# Patient Record
Sex: Female | Born: 1935 | Race: White | Hispanic: No | Smoking: Never smoker
Health system: Southern US, Community
[De-identification: ages and names within clinical notes are randomized; demographics above are authoritative.]

## PROBLEM LIST (undated history)

## (undated) DIAGNOSIS — K219 Gastro-esophageal reflux disease without esophagitis: Secondary | ICD-10-CM

## (undated) DIAGNOSIS — M4800 Spinal stenosis, site unspecified: Secondary | ICD-10-CM

## (undated) DIAGNOSIS — I2510 Atherosclerotic heart disease of native coronary artery without angina pectoris: Secondary | ICD-10-CM

## (undated) DIAGNOSIS — R0600 Dyspnea, unspecified: Secondary | ICD-10-CM

## (undated) DIAGNOSIS — Z8679 Personal history of other diseases of the circulatory system: Secondary | ICD-10-CM

## (undated) DIAGNOSIS — I4510 Unspecified right bundle-branch block: Secondary | ICD-10-CM

## (undated) DIAGNOSIS — F320 Major depressive disorder, single episode, mild: Secondary | ICD-10-CM

## (undated) DIAGNOSIS — F32A Depression, unspecified: Secondary | ICD-10-CM

## (undated) DIAGNOSIS — M51369 Other intervertebral disc degeneration, lumbar region without mention of lumbar back pain or lower extremity pain: Secondary | ICD-10-CM

## (undated) DIAGNOSIS — I519 Heart disease, unspecified: Secondary | ICD-10-CM

## (undated) DIAGNOSIS — H269 Unspecified cataract: Secondary | ICD-10-CM

## (undated) DIAGNOSIS — I517 Cardiomegaly: Secondary | ICD-10-CM

## (undated) DIAGNOSIS — D369 Benign neoplasm, unspecified site: Secondary | ICD-10-CM

## (undated) DIAGNOSIS — N189 Chronic kidney disease, unspecified: Secondary | ICD-10-CM

## (undated) DIAGNOSIS — Z972 Presence of dental prosthetic device (complete) (partial): Secondary | ICD-10-CM

## (undated) DIAGNOSIS — E559 Vitamin D deficiency, unspecified: Secondary | ICD-10-CM

## (undated) DIAGNOSIS — E7800 Pure hypercholesterolemia, unspecified: Secondary | ICD-10-CM

## (undated) DIAGNOSIS — M5136 Other intervertebral disc degeneration, lumbar region: Secondary | ICD-10-CM

## (undated) DIAGNOSIS — I10 Essential (primary) hypertension: Secondary | ICD-10-CM

## (undated) DIAGNOSIS — Z8719 Personal history of other diseases of the digestive system: Secondary | ICD-10-CM

## (undated) DIAGNOSIS — F419 Anxiety disorder, unspecified: Secondary | ICD-10-CM

## (undated) DIAGNOSIS — M1990 Unspecified osteoarthritis, unspecified site: Secondary | ICD-10-CM

## (undated) DIAGNOSIS — R7303 Prediabetes: Secondary | ICD-10-CM

## (undated) HISTORY — DX: Essential (primary) hypertension: I10

## (undated) HISTORY — PX: DILATION AND CURETTAGE OF UTERUS: SHX78

## (undated) HISTORY — DX: Vitamin D deficiency, unspecified: E55.9

## (undated) HISTORY — PX: COLONOSCOPY: SHX174

## (undated) HISTORY — DX: Benign neoplasm, unspecified site: D36.9

## (undated) HISTORY — PX: CHOLECYSTECTOMY: SHX55

## (undated) HISTORY — DX: Atherosclerotic heart disease of native coronary artery without angina pectoris: I25.10

## (undated) HISTORY — PX: CRYOTHERAPY: SHX1416

## (undated) HISTORY — DX: Depression, unspecified: F32.A

## (undated) HISTORY — DX: Pure hypercholesterolemia, unspecified: E78.00

## (undated) HISTORY — DX: Major depressive disorder, single episode, mild: F32.0

## (undated) HISTORY — PX: OTHER SURGICAL HISTORY: SHX169

---

## 2005-05-20 ENCOUNTER — Emergency Department (HOSPITAL_COMMUNITY): Admission: EM | Admit: 2005-05-20 | Discharge: 2005-05-20 | Payer: Self-pay | Admitting: Emergency Medicine

## 2007-09-25 ENCOUNTER — Encounter: Admission: RE | Admit: 2007-09-25 | Discharge: 2007-09-25 | Payer: Self-pay | Admitting: Family Medicine

## 2009-08-26 ENCOUNTER — Encounter: Admission: RE | Admit: 2009-08-26 | Discharge: 2009-08-26 | Payer: Self-pay | Admitting: Family Medicine

## 2010-08-12 ENCOUNTER — Encounter: Admission: RE | Admit: 2010-08-12 | Discharge: 2010-08-12 | Payer: Self-pay | Admitting: Orthopedic Surgery

## 2010-08-20 ENCOUNTER — Encounter: Admission: RE | Admit: 2010-08-20 | Discharge: 2010-08-20 | Payer: Self-pay | Admitting: Orthopedic Surgery

## 2011-01-10 ENCOUNTER — Encounter: Payer: Self-pay | Admitting: Family Medicine

## 2013-02-25 ENCOUNTER — Other Ambulatory Visit (HOSPITAL_COMMUNITY): Payer: Self-pay | Admitting: Orthopaedic Surgery

## 2013-03-01 ENCOUNTER — Encounter (HOSPITAL_COMMUNITY): Payer: Self-pay | Admitting: Pharmacy Technician

## 2013-03-06 ENCOUNTER — Ambulatory Visit (HOSPITAL_COMMUNITY)
Admission: RE | Admit: 2013-03-06 | Discharge: 2013-03-06 | Disposition: A | Discharge status: Home / Self Care | Payer: Medicare Other | Source: Ambulatory Visit | Attending: Orthopaedic Surgery | Admitting: Orthopaedic Surgery

## 2013-03-06 ENCOUNTER — Encounter (HOSPITAL_COMMUNITY): Payer: Self-pay

## 2013-03-06 ENCOUNTER — Encounter (HOSPITAL_COMMUNITY)
Admission: RE | Admit: 2013-03-06 | Discharge: 2013-03-06 | Disposition: A | Discharge status: Home / Self Care | Payer: Medicare Other | Source: Ambulatory Visit | Attending: Orthopaedic Surgery | Admitting: Orthopaedic Surgery

## 2013-03-06 DIAGNOSIS — Z01812 Encounter for preprocedural laboratory examination: Secondary | ICD-10-CM | POA: Insufficient documentation

## 2013-03-06 DIAGNOSIS — M169 Osteoarthritis of hip, unspecified: Secondary | ICD-10-CM | POA: Insufficient documentation

## 2013-03-06 DIAGNOSIS — M1610 Unilateral primary osteoarthritis, unspecified hip: Secondary | ICD-10-CM | POA: Insufficient documentation

## 2013-03-06 HISTORY — DX: Essential (primary) hypertension: I10

## 2013-03-06 HISTORY — DX: Unspecified osteoarthritis, unspecified site: M19.90

## 2013-03-06 HISTORY — PX: CATARACT EXTRACTION: SUR2

## 2013-03-06 HISTORY — DX: Gastro-esophageal reflux disease without esophagitis: K21.9

## 2013-03-06 LAB — CBC
HCT: 39.3 % (ref 36.0–46.0)
Hemoglobin: 13.3 g/dL (ref 12.0–15.0)
MCH: 29 pg (ref 26.0–34.0)
MCHC: 33.8 g/dL (ref 30.0–36.0)
MCV: 85.8 fL (ref 78.0–100.0)
Platelets: 429 10*3/uL — ABNORMAL HIGH (ref 150–400)
RBC: 4.58 MIL/uL (ref 3.87–5.11)
RDW: 13 % (ref 11.5–15.5)
WBC: 8.5 10*3/uL (ref 4.0–10.5)

## 2013-03-06 LAB — BASIC METABOLIC PANEL
BUN: 21 mg/dL (ref 6–23)
CO2: 24 mEq/L (ref 19–32)
Calcium: 9.7 mg/dL (ref 8.4–10.5)
Chloride: 96 mEq/L (ref 96–112)
Creatinine, Ser: 0.93 mg/dL (ref 0.50–1.10)
GFR calc Af Amer: 67 mL/min — ABNORMAL LOW (ref >90)
GFR calc non Af Amer: 58 mL/min — ABNORMAL LOW (ref >90)
Glucose, Bld: 93 mg/dL (ref 70–99)
Potassium: 4.8 mEq/L (ref 3.5–5.1)
Sodium: 131 mEq/L — ABNORMAL LOW (ref 135–145)

## 2013-03-06 LAB — URINE MICROSCOPIC-ADD ON

## 2013-03-06 LAB — PROTIME-INR
INR: 0.96 (ref 0.00–1.49)
Prothrombin Time: 12.7 seconds (ref 11.6–15.2)

## 2013-03-06 LAB — URINALYSIS, ROUTINE W REFLEX MICROSCOPIC
Bilirubin Urine: NEGATIVE
Glucose, UA: NEGATIVE mg/dL
Ketones, ur: NEGATIVE mg/dL
Nitrite: NEGATIVE
Protein, ur: NEGATIVE mg/dL
Specific Gravity, Urine: 1.015 (ref 1.005–1.030)
Urobilinogen, UA: 0.2 mg/dL (ref 0.0–1.0)
pH: 5.5 (ref 5.0–8.0)

## 2013-03-06 LAB — ABO/RH: ABO/RH(D): O NEG

## 2013-03-06 LAB — SURGICAL PCR SCREEN
MRSA, PCR: NEGATIVE
Staphylococcus aureus: NEGATIVE

## 2013-03-06 LAB — APTT: aPTT: 34 seconds (ref 24–37)

## 2013-03-06 NOTE — Progress Notes (Signed)
03-06-13 labs viewable in Epic-note urinalysis.

## 2013-03-06 NOTE — Pre-Procedure Instructions (Addendum)
03-06-13 EKG/ CXR done today. 03-06-13 1640- Urinalysis report faxed to Dr. Trevor Mace office. 03-07-13 1030 Voice message left to inform pt. Of date of surgery and time change-now surgery date 03-08-13 -1215 PM, instructed pt. To arrive by 0900 AM to short stay-Nothing to eat or drink after Midnight except take med with sip of water. Instructed pt. To return call to [id number]. WFloy Sabina [id number] Pt. Returned call and confirmed she is aware of date and time change-will arrive by 0900 AM. W. Amaal Dimartino,RN

## 2013-03-06 NOTE — Patient Instructions (Addendum)
20 Deborah Jordan  03/06/2013   Your procedure is scheduled on:  3-28 -2014  Report to [facility] at    1000    AM.  Call this number if you have problems the morning of surgery: [phone]  Or Presurgical Testing [phone](Meggen Spaziani)      Do not eat food:After Midnight.   May have clear liquids:up to 6 Hours before arrival. Nothing after : 0700 AM  Clear liquids include soda, tea, black coffee, apple or grape juice, broth.  Take these medicines the morning of surgery with A SIP OF WATER: Nexium. Do not take Diovan AM of surgery-take all other days.   Do not wear jewelry, make-up or nail polish.  Do not wear lotions, powders, or perfumes. You may wear deodorant.  Do not shave 12 hours prior to first CHG shower(legs and under arms).(face and neck okay.)  Do not bring valuables to the hospital.  Contacts, dentures or bridgework,body piercing,  may not be worn into surgery.  Leave suitcase in the car. After surgery it may be brought to your room.  For patients admitted to the hospital, checkout time is 11:00 AM the day of discharge.   Patients discharged the day of surgery will not be allowed to drive home. Must have responsible person with you x 24 hours once discharged.  Name and phone number of your driver: Meredith Mody [phone] cell/ [phone]  Special Instructions: CHG(Chlorhedine 4%-"Hibiclens","Betasept","Aplicare") Shower Use Special Wash: see special instructions.(avoid face and genitals)   Please read over the following fact sheets that you were given: MRSA Information, Blood Transfusion fact sheet, Incentive Spirometry Instruction.    Failure to follow these instructions may result in Cancellation of your surgery.   Patient signature_______________________________________________________

## 2013-03-07 ENCOUNTER — Other Ambulatory Visit (HOSPITAL_COMMUNITY): Payer: Self-pay | Admitting: Orthopaedic Surgery

## 2013-03-08 ENCOUNTER — Inpatient Hospital Stay (HOSPITAL_COMMUNITY): Payer: Medicare Other | Admitting: Anesthesiology

## 2013-03-08 ENCOUNTER — Inpatient Hospital Stay (HOSPITAL_COMMUNITY): Payer: Medicare Other

## 2013-03-08 ENCOUNTER — Inpatient Hospital Stay (HOSPITAL_COMMUNITY)
Admission: RE | Admit: 2013-03-08 | Discharge: 2013-03-11 | DRG: 470 | Disposition: A | Discharge status: Home Health | Payer: Medicare Other | Source: Ambulatory Visit | Attending: Orthopaedic Surgery | Admitting: Orthopaedic Surgery

## 2013-03-08 ENCOUNTER — Encounter (HOSPITAL_COMMUNITY): Payer: Self-pay | Admitting: *Deleted

## 2013-03-08 ENCOUNTER — Encounter (HOSPITAL_COMMUNITY): Payer: Self-pay | Admitting: Anesthesiology

## 2013-03-08 ENCOUNTER — Encounter (HOSPITAL_COMMUNITY)
Admission: RE | Disposition: A | Discharge status: Home Health | Payer: Self-pay | Source: Ambulatory Visit | Attending: Orthopaedic Surgery

## 2013-03-08 DIAGNOSIS — M169 Osteoarthritis of hip, unspecified: Principal | ICD-10-CM | POA: Diagnosis present

## 2013-03-08 DIAGNOSIS — I10 Essential (primary) hypertension: Secondary | ICD-10-CM

## 2013-03-08 DIAGNOSIS — K219 Gastro-esophageal reflux disease without esophagitis: Secondary | ICD-10-CM | POA: Diagnosis present

## 2013-03-08 DIAGNOSIS — E871 Hypo-osmolality and hyponatremia: Secondary | ICD-10-CM

## 2013-03-08 DIAGNOSIS — J90 Pleural effusion, not elsewhere classified: Secondary | ICD-10-CM

## 2013-03-08 DIAGNOSIS — M1610 Unilateral primary osteoarthritis, unspecified hip: Principal | ICD-10-CM | POA: Diagnosis present

## 2013-03-08 HISTORY — PX: TOTAL HIP ARTHROPLASTY: SHX124

## 2013-03-08 LAB — URINE CULTURE: Colony Count: 50000

## 2013-03-08 LAB — URINALYSIS, ROUTINE W REFLEX MICROSCOPIC
Bilirubin Urine: NEGATIVE
Glucose, UA: NEGATIVE mg/dL
Ketones, ur: NEGATIVE mg/dL
Nitrite: NEGATIVE
Protein, ur: NEGATIVE mg/dL
Specific Gravity, Urine: 1.019 (ref 1.005–1.030)
Urobilinogen, UA: 0.2 mg/dL (ref 0.0–1.0)
pH: 6.5 (ref 5.0–8.0)

## 2013-03-08 LAB — URINE MICROSCOPIC-ADD ON

## 2013-03-08 SURGERY — ARTHROPLASTY, HIP, TOTAL, ANTERIOR APPROACH
Anesthesia: General | Site: Hip | Laterality: Left | Wound class: Clean

## 2013-03-08 MED ORDER — FENTANYL CITRATE 0.05 MG/ML IJ SOLN
INTRAMUSCULAR | Status: DC | PRN
  Administered 2013-03-08 (×2): 50 ug via INTRAVENOUS
  Administered 2013-03-08: 100 ug via INTRAVENOUS
  Administered 2013-03-08: 50 ug via INTRAVENOUS

## 2013-03-08 MED ORDER — OXYCODONE HCL 5 MG/5ML PO SOLN: 5.0000 mg | Freq: Once | ORAL | Status: DC | PRN

## 2013-03-08 MED ORDER — METHOCARBAMOL 500 MG PO TABS
500.0000 mg | ORAL_TABLET | Freq: Four times a day (QID) | ORAL | Status: DC | PRN
  Administered 2013-03-09 – 2013-03-10 (×2): 500 mg via ORAL
  Filled 2013-03-08 (×2): qty 1

## 2013-03-08 MED ORDER — OXYCODONE HCL 5 MG PO TABS
5.0000 mg | ORAL_TABLET | ORAL | Status: DC | PRN
  Administered 2013-03-09: 5 mg via ORAL
  Administered 2013-03-09: 10 mg via ORAL
  Administered 2013-03-09: 5 mg via ORAL
  Administered 2013-03-10: 10 mg via ORAL
  Administered 2013-03-10: 5 mg via ORAL
  Administered 2013-03-10 – 2013-03-11 (×2): 10 mg via ORAL
  Filled 2013-03-08: qty 1
  Filled 2013-03-08 (×3): qty 2
  Filled 2013-03-08: qty 1
  Filled 2013-03-08: qty 2
  Filled 2013-03-08: qty 1
  Filled 2013-03-08: qty 2

## 2013-03-08 MED ORDER — ROCURONIUM BROMIDE 100 MG/10ML IV SOLN
INTRAVENOUS | Status: DC | PRN
  Administered 2013-03-08: 30 mg via INTRAVENOUS

## 2013-03-08 MED ORDER — METOCLOPRAMIDE HCL 10 MG PO TABS: 5.0000 mg | ORAL_TABLET | Freq: Three times a day (TID) | ORAL | Status: DC | PRN

## 2013-03-08 MED ORDER — LACTATED RINGERS IV SOLN
INTRAVENOUS | Status: DC | PRN
  Administered 2013-03-08 (×2): via INTRAVENOUS

## 2013-03-08 MED ORDER — ASPIRIN EC 325 MG PO TBEC
325.0000 mg | DELAYED_RELEASE_TABLET | Freq: Two times a day (BID) | ORAL | Status: DC
  Administered 2013-03-08 – 2013-03-11 (×5): 325 mg via ORAL
  Filled 2013-03-08 (×9): qty 1

## 2013-03-08 MED ORDER — PANTOPRAZOLE SODIUM 40 MG PO TBEC
80.0000 mg | DELAYED_RELEASE_TABLET | Freq: Every day | ORAL | Status: DC
  Administered 2013-03-09 – 2013-03-11 (×3): 80 mg via ORAL
  Filled 2013-03-08 (×3): qty 2

## 2013-03-08 MED ORDER — IRBESARTAN 300 MG PO TABS
300.0000 mg | ORAL_TABLET | Freq: Every day | ORAL | Status: DC
  Administered 2013-03-08 – 2013-03-11 (×3): 300 mg via ORAL
  Filled 2013-03-08 (×4): qty 1

## 2013-03-08 MED ORDER — HYDROMORPHONE HCL PF 1 MG/ML IJ SOLN
0.5000 mg | INTRAMUSCULAR | Status: DC | PRN
  Administered 2013-03-08 – 2013-03-09 (×4): 0.5 mg via INTRAVENOUS
  Filled 2013-03-08 (×4): qty 1

## 2013-03-08 MED ORDER — SODIUM CHLORIDE 0.9 % IV SOLN
INTRAVENOUS | Status: DC | PRN
  Administered 2013-03-08: 1000 mL via INTRAMUSCULAR

## 2013-03-08 MED ORDER — MENTHOL 3 MG MT LOZG
1.0000 | LOZENGE | OROMUCOSAL | Status: DC | PRN
  Filled 2013-03-08: qty 9

## 2013-03-08 MED ORDER — METOCLOPRAMIDE HCL 5 MG/ML IJ SOLN
10.0000 mg | Freq: Once | INTRAMUSCULAR | Status: AC | PRN
  Administered 2013-03-08: 10 mg via INTRAVENOUS

## 2013-03-08 MED ORDER — METHOCARBAMOL 100 MG/ML IJ SOLN: 500.0000 mg | Freq: Four times a day (QID) | INTRAVENOUS | Status: DC | PRN

## 2013-03-08 MED ORDER — ZOLPIDEM TARTRATE 5 MG PO TABS: 5.0000 mg | ORAL_TABLET | Freq: Every evening | ORAL | Status: DC | PRN

## 2013-03-08 MED ORDER — ACETAMINOPHEN 325 MG PO TABS
650.0000 mg | ORAL_TABLET | Freq: Four times a day (QID) | ORAL | Status: DC | PRN
  Administered 2013-03-10 (×2): 650 mg via ORAL
  Filled 2013-03-08 (×2): qty 2

## 2013-03-08 MED ORDER — METOCLOPRAMIDE HCL 5 MG/ML IJ SOLN
5.0000 mg | Freq: Three times a day (TID) | INTRAMUSCULAR | Status: DC | PRN
  Administered 2013-03-09: 5 mg via INTRAVENOUS
  Administered 2013-03-10: 10 mg via INTRAVENOUS
  Filled 2013-03-08 (×2): qty 2

## 2013-03-08 MED ORDER — LABETALOL HCL 5 MG/ML IV SOLN
INTRAVENOUS | Status: DC | PRN
  Administered 2013-03-08 (×2): 5 mg via INTRAVENOUS

## 2013-03-08 MED ORDER — FERROUS SULFATE 325 (65 FE) MG PO TABS
325.0000 mg | ORAL_TABLET | Freq: Three times a day (TID) | ORAL | Status: DC
  Administered 2013-03-08 – 2013-03-11 (×6): 325 mg via ORAL
  Filled 2013-03-08 (×12): qty 1

## 2013-03-08 MED ORDER — ALUM & MAG HYDROXIDE-SIMETH 200-200-20 MG/5ML PO SUSP
30.0000 mL | ORAL | Status: DC | PRN
  Administered 2013-03-10: 30 mL via ORAL
  Filled 2013-03-08: qty 30

## 2013-03-08 MED ORDER — HYDROMORPHONE HCL PF 1 MG/ML IJ SOLN
INTRAMUSCULAR | Status: DC | PRN
  Administered 2013-03-08 (×2): 0.5 mg via INTRAVENOUS

## 2013-03-08 MED ORDER — 0.9 % SODIUM CHLORIDE (POUR BTL) OPTIME
TOPICAL | Status: DC | PRN
  Administered 2013-03-08: 1000 mL

## 2013-03-08 MED ORDER — LACTATED RINGERS IV SOLN
INTRAVENOUS | Status: DC
  Administered 2013-03-08: 20:00:00 via INTRAVENOUS

## 2013-03-08 MED ORDER — OXYCODONE HCL ER 10 MG PO T12A
10.0000 mg | EXTENDED_RELEASE_TABLET | Freq: Two times a day (BID) | ORAL | Status: DC
Start: 2013-03-08 — End: 2013-03-11
  Administered 2013-03-08 – 2013-03-11 (×5): 10 mg via ORAL
  Filled 2013-03-08 (×5): qty 1

## 2013-03-08 MED ORDER — LIDOCAINE HCL (CARDIAC) 20 MG/ML IV SOLN
INTRAVENOUS | Status: DC | PRN
  Administered 2013-03-08: 50 mg via INTRAVENOUS

## 2013-03-08 MED ORDER — ACETAMINOPHEN 10 MG/ML IV SOLN
INTRAVENOUS | Status: DC | PRN
  Administered 2013-03-08: 1000 mg via INTRAVENOUS

## 2013-03-08 MED ORDER — CEFAZOLIN SODIUM-DEXTROSE 2-3 GM-% IV SOLR
2.0000 g | INTRAVENOUS | Status: AC
  Administered 2013-03-08: 2 g via INTRAVENOUS

## 2013-03-08 MED ORDER — DIPHENHYDRAMINE HCL 12.5 MG/5ML PO ELIX: 12.5000 mg | ORAL_SOLUTION | ORAL | Status: DC | PRN

## 2013-03-08 MED ORDER — ONDANSETRON HCL 4 MG/2ML IJ SOLN
INTRAMUSCULAR | Status: DC | PRN
  Administered 2013-03-08: 4 mg via INTRAVENOUS

## 2013-03-08 MED ORDER — STERILE WATER FOR IRRIGATION IR SOLN
Status: DC | PRN
  Administered 2013-03-08: 3000 mL

## 2013-03-08 MED ORDER — ACETAMINOPHEN 650 MG RE SUPP: 650.0000 mg | Freq: Four times a day (QID) | RECTAL | Status: DC | PRN

## 2013-03-08 MED ORDER — ONDANSETRON HCL 4 MG/2ML IJ SOLN
4.0000 mg | Freq: Four times a day (QID) | INTRAMUSCULAR | Status: DC | PRN
  Administered 2013-03-08 – 2013-03-09 (×2): 4 mg via INTRAVENOUS
  Filled 2013-03-08 (×2): qty 2

## 2013-03-08 MED ORDER — CEFAZOLIN SODIUM 1-5 GM-% IV SOLN
1.0000 g | Freq: Four times a day (QID) | INTRAVENOUS | Status: AC
Start: 2013-03-08 — End: 2013-03-09
  Administered 2013-03-08 – 2013-03-09 (×2): 1 g via INTRAVENOUS
  Filled 2013-03-08 (×2): qty 50

## 2013-03-08 MED ORDER — DOCUSATE SODIUM 100 MG PO CAPS
100.0000 mg | ORAL_CAPSULE | Freq: Two times a day (BID) | ORAL | Status: DC
  Administered 2013-03-09 – 2013-03-11 (×5): 100 mg via ORAL
  Filled 2013-03-08 (×7): qty 1

## 2013-03-08 MED ORDER — ONDANSETRON HCL 4 MG PO TABS
4.0000 mg | ORAL_TABLET | Freq: Four times a day (QID) | ORAL | Status: DC | PRN
  Administered 2013-03-10: 4 mg via ORAL
  Filled 2013-03-08: qty 1

## 2013-03-08 MED ORDER — PROPOFOL 10 MG/ML IV BOLUS
INTRAVENOUS | Status: DC | PRN
  Administered 2013-03-08: 150 mg via INTRAVENOUS

## 2013-03-08 MED ORDER — SODIUM CHLORIDE 0.9 % IV SOLN
INTRAVENOUS | Status: DC
  Administered 2013-03-08: 1000 mL via INTRAVENOUS
  Administered 2013-03-09: 11:00:00 via INTRAVENOUS

## 2013-03-08 MED ORDER — HYDROMORPHONE HCL PF 1 MG/ML IJ SOLN
0.2500 mg | INTRAMUSCULAR | Status: DC | PRN
  Administered 2013-03-08: 0.5 mg via INTRAVENOUS

## 2013-03-08 MED ORDER — PHENOL 1.4 % MT LIQD: 1.0000 | OROMUCOSAL | Status: DC | PRN

## 2013-03-08 MED ORDER — EPHEDRINE SULFATE 50 MG/ML IJ SOLN
INTRAMUSCULAR | Status: DC | PRN
  Administered 2013-03-08: 5 mg via INTRAVENOUS

## 2013-03-08 MED ORDER — OXYCODONE HCL 5 MG PO TABS: 5.0000 mg | ORAL_TABLET | Freq: Once | ORAL | Status: DC | PRN

## 2013-03-08 SURGICAL SUPPLY — 42 items
ADH SKN CLS APL DERMABOND .7 (GAUZE/BANDAGES/DRESSINGS) ×1
BAG SPEC THK2 15X12 ZIP CLS (MISCELLANEOUS) ×2
BAG ZIPLOCK 12X15 (MISCELLANEOUS) ×4 IMPLANT
BLADE SAW SGTL 18X1.27X75 (BLADE) ×2 IMPLANT
CELLS DAT CNTRL 66122 CELL SVR (MISCELLANEOUS) ×1 IMPLANT
CLOTH BEACON ORANGE TIMEOUT ST (SAFETY) ×2 IMPLANT
DERMABOND ADVANCED (GAUZE/BANDAGES/DRESSINGS) ×1
DERMABOND ADVANCED .7 DNX12 (GAUZE/BANDAGES/DRESSINGS) ×1 IMPLANT
DRAPE C-ARM 42X72 X-RAY (DRAPES) ×2 IMPLANT
DRAPE STERI IOBAN 125X83 (DRAPES) ×2 IMPLANT
DRAPE U-SHAPE 47X51 STRL (DRAPES) ×6 IMPLANT
DRSG AQUACEL AG ADV 3.5X10 (GAUZE/BANDAGES/DRESSINGS) ×2 IMPLANT
DURAPREP 26ML APPLICATOR (WOUND CARE) ×2 IMPLANT
ELECT BLADE TIP CTD 4 INCH (ELECTRODE) ×2 IMPLANT
ELECT REM PT RETURN 9FT ADLT (ELECTROSURGICAL) ×2
ELECTRODE REM PT RTRN 9FT ADLT (ELECTROSURGICAL) ×1 IMPLANT
FACESHIELD LNG OPTICON STERILE (SAFETY) ×8 IMPLANT
GLOVE BIO SURGEON STRL SZ7 (GLOVE) ×2 IMPLANT
GLOVE BIO SURGEON STRL SZ7.5 (GLOVE) ×2 IMPLANT
GLOVE BIOGEL PI IND STRL 7.5 (GLOVE) IMPLANT
GLOVE BIOGEL PI IND STRL 8 (GLOVE) ×1 IMPLANT
GLOVE BIOGEL PI INDICATOR 7.5 (GLOVE)
GLOVE BIOGEL PI INDICATOR 8 (GLOVE) ×1
GLOVE ECLIPSE 7.0 STRL STRAW (GLOVE) ×2 IMPLANT
GOWN STRL REIN XL XLG (GOWN DISPOSABLE) ×4 IMPLANT
HANDPIECE INTERPULSE COAX TIP (DISPOSABLE) ×1
KIT BASIN OR (CUSTOM PROCEDURE TRAY) ×2 IMPLANT
PACK TOTAL JOINT (CUSTOM PROCEDURE TRAY) ×2 IMPLANT
PADDING CAST COTTON 6X4 STRL (CAST SUPPLIES) ×2 IMPLANT
RTRCTR WOUND ALEXIS 18CM MED (MISCELLANEOUS) ×2
SET HNDPC FAN SPRY TIP SCT (DISPOSABLE) ×1 IMPLANT
SUT ETHIBOND NAB CT1 #1 30IN (SUTURE) ×6 IMPLANT
SUT MNCRL AB 4-0 PS2 18 (SUTURE) ×2 IMPLANT
SUT VIC AB 0 CT1 27 (SUTURE) ×4
SUT VIC AB 0 CT1 27XBRD ANTBC (SUTURE) ×2 IMPLANT
SUT VIC AB 1 CT1 36 (SUTURE) ×4 IMPLANT
SUT VIC AB 2-0 CT1 27 (SUTURE) ×4
SUT VIC AB 2-0 CT1 TAPERPNT 27 (SUTURE) ×2 IMPLANT
SUT VLOC 180 0 24IN GS25 (SUTURE) ×2 IMPLANT
TOWEL OR 17X26 10 PK STRL BLUE (TOWEL DISPOSABLE) ×4 IMPLANT
TOWEL OR NON WOVEN STRL DISP B (DISPOSABLE) ×2 IMPLANT
TRAY FOLEY CATH 14FRSI W/METER (CATHETERS) ×2 IMPLANT

## 2013-03-08 NOTE — Transfer of Care (Signed)
Immediate Anesthesia Transfer of Care Note  Patient: Deborah Jordan  Procedure(s) Performed: Procedure(s) (LRB): LEFT TOTAL HIP ARTHROPLASTY ANTERIOR APPROACH (Left)  Patient Location: PACU  Anesthesia Type: General  Level of Consciousness: sedated, patient cooperative and responds to stimulaton  Airway & Oxygen Therapy: Patient Spontanous Breathing and Patient connected to face mask oxgen  Post-op Assessment: Report given to PACU RN and Post -op Vital signs reviewed and stable  Post vital signs: Reviewed and stable  Complications: No apparent anesthesia complications

## 2013-03-08 NOTE — Brief Op Note (Signed)
03/08/2013  1:32 PM  PATIENT:  [name]  79 y.o. female  PRE-OPERATIVE DIAGNOSIS:  Left hip severe osteoarthritis  POST-OPERATIVE DIAGNOSIS:  Left hip severe osteoarthritis  PROCEDURE:  Procedure(s): LEFT TOTAL HIP ARTHROPLASTY ANTERIOR APPROACH (Left)  SURGEON:  Surgeon(s) and Role:    * Mcarthur Rossetti, MD - Primary  PHYSICIAN ASSISTANT: Benita Stabile, PA-C  ANESTHESIA:   general  EBL:  Total I/O In: 1000 [I.V.:1000] Out: 300 [Blood:300]  BLOOD ADMINISTERED:none  DRAINS: none   LOCAL MEDICATIONS USED:  NONE  SPECIMEN:  No Specimen  DISPOSITION OF SPECIMEN:  N/A  COUNTS:  YES  TOURNIQUET:  * No tourniquets in log *  DICTATION: .Other Dictation: Dictation Number [id number]  PLAN OF CARE: Admit to inpatient   PATIENT DISPOSITION:  PACU - hemodynamically stable.   Delay start of Pharmacological VTE agent (>24hrs) due to surgical blood loss or risk of bleeding: no

## 2013-03-08 NOTE — Anesthesia Preprocedure Evaluation (Signed)
Anesthesia Evaluation  Patient identified by MRN, date of birth, ID band Patient awake    Reviewed: Allergy & Precautions, H&P , NPO status , Patient's Chart, lab work & pertinent test results, reviewed documented beta blocker date and time   Airway Mallampati: II TM Distance: >3 FB Neck ROM: full    Dental   Pulmonary neg pulmonary ROS,  breath sounds clear to auscultation        Cardiovascular hypertension, On Medications Rhythm:regular     Neuro/Psych negative neurological ROS  negative psych ROS   GI/Hepatic Neg liver ROS, GERD-  Medicated and Controlled,  Endo/Other  negative endocrine ROS  Renal/GU negative Renal ROS  negative genitourinary   Musculoskeletal   Abdominal   Peds  Hematology negative hematology ROS (+)   Anesthesia Other Findings See surgeon's H&P   Reproductive/Obstetrics negative OB ROS                           Anesthesia Physical Anesthesia Plan  ASA: II  Anesthesia Plan: General   Post-op Pain Management:    Induction: Intravenous  Airway Management Planned: Oral ETT  Additional Equipment:   Intra-op Plan:   Post-operative Plan: Extubation in OR  Informed Consent: I have reviewed the patients History and Physical, chart, labs and discussed the procedure including the risks, benefits and alternatives for the proposed anesthesia with the patient or authorized representative who has indicated his/her understanding and acceptance.   Dental Advisory Given  Plan Discussed with: CRNA and Surgeon  Anesthesia Plan Comments:         Anesthesia Quick Evaluation

## 2013-03-08 NOTE — H&P (Signed)
TOTAL HIP ADMISSION H&P  Patient is admitted for left total hip arthroplasty.  Subjective:  Chief Complaint: left hip pain  HPI: Deborah Jordan, 79 y.o. female, has a history of pain and functional disability in the left hip(s) due to arthritis and patient has failed non-surgical conservative treatments for greater than 12 weeks to include NSAID's and/or analgesics, use of assistive devices, weight reduction as appropriate and activity modification.  Onset of symptoms was gradual starting 1 years ago with rapidlly worsening course since that time.The patient noted no past surgery on the left hip(s).  Patient currently rates pain in the left hip at 10 out of 10 with activity. Patient has night pain, worsening of pain with activity and weight bearing, trendelenberg gait, pain that interfers with activities of daily living, pain with passive range of motion and crepitus. Patient has evidence of subchondral cysts, subchondral sclerosis, periarticular osteophytes and joint space narrowing by imaging studies. This condition presents safety issues increasing the risk of falls.  There is no current active infection.  Patient Active Problem List   Diagnosis Date Noted  . Degenerative arthritis of hip 03/08/2013   Past Medical History  Diagnosis Date  . Hypertension   . GERD (gastroesophageal reflux disease)     controls with Nexium  . Arthritis     osteoarthritis. spinal stenosis. Scoliosis of spine-degenerative spine.    Past Surgical History  Procedure Laterality Date  . Cholecystectomy    . Cataract extraction  03-06-13    left eye  . Dilation and curettage of uterus      Prescriptions prior to admission  Medication Sig Dispense Refill  . esomeprazole (NEXIUM) 40 MG capsule Take 40 mg by mouth as needed (indigestion).      . naproxen sodium (ANAPROX) 220 MG tablet Take 220 mg by mouth 2 (two) times daily with a meal.      . valsartan (DIOVAN) 320 MG tablet Take 320 mg by mouth daily before  breakfast.       No Known Allergies  History  Substance Use Topics  . Smoking status: Not on file  . Smokeless tobacco: Not on file  . Alcohol Use: No    History reviewed. No pertinent family history.   Review of Systems  Musculoskeletal: Positive for joint pain.  All other systems reviewed and are negative.    Objective:  Physical Exam  Constitutional: She is oriented to person, place, and time. She appears well-developed and well-nourished.  HENT:  Head: Normocephalic and atraumatic.  Eyes: EOM are normal. Pupils are equal, round, and reactive to light.  Neck: Normal range of motion. Neck supple.  Cardiovascular: Normal rate and regular rhythm.   Respiratory: Effort normal and breath sounds normal.  GI: Soft. Bowel sounds are normal.  Musculoskeletal:       Left hip: She exhibits decreased range of motion, decreased strength, bony tenderness and crepitus.  Neurological: She is alert and oriented to person, place, and time.  Skin: Skin is warm and dry.  Psychiatric: She has a normal mood and affect.    Vital signs in last 24 hours: Temp:  [98.5 F (36.9 C)] 98.5 F (36.9 C) (03/21 0909) Pulse Rate:  [71] 71 (03/21 0909) Resp:  [18] 18 (03/21 0909) BP: (160)/(66) 160/66 mmHg (03/21 0909) SpO2:  [100 %] 100 % (03/21 0909)  Labs:   There is no weight on file to calculate BMI.   Imaging Review Plain radiographs demonstrate severe degenerative joint disease of the left  hip(s). The bone quality appears to be good for age and reported activity level.  Assessment/Plan:  End stage arthritis, left hip(s)  The patient history, physical examination, clinical judgement of the provider and imaging studies are consistent with end stage degenerative joint disease of the left hip(s) and total hip arthroplasty is deemed medically necessary. The treatment options including medical management, injection therapy, arthroscopy and arthroplasty were discussed at length. The risks  and benefits of total hip arthroplasty were presented and reviewed. The risks due to aseptic loosening, infection, stiffness, dislocation/subluxation,  thromboembolic complications and other imponderables were discussed.  The patient acknowledged the explanation, agreed to proceed with the plan and consent was signed. Patient is being admitted for inpatient treatment for surgery, pain control, PT, OT, prophylactic antibiotics, VTE prophylaxis, progressive ambulation and ADL's and discharge planning.The patient is planning to be discharged home with home health services

## 2013-03-08 NOTE — Anesthesia Postprocedure Evaluation (Signed)
Anesthesia Post Note  Patient: Deborah Jordan  Procedure(s) Performed: Procedure(s) (LRB): LEFT TOTAL HIP ARTHROPLASTY ANTERIOR APPROACH (Left)  Anesthesia type: general  Patient location: PACU  Post pain: Pain level controlled  Post assessment: Patient's Cardiovascular Status Stable  Last Vitals:  Filed Vitals:   03/08/13 1500  BP:   Pulse:   Temp: 36.4 C  Resp:     Post vital signs: Reviewed and stable  Level of consciousness: sedated  Complications: No apparent anesthesia complications

## 2013-03-09 ENCOUNTER — Encounter (HOSPITAL_COMMUNITY): Payer: Self-pay

## 2013-03-09 LAB — BASIC METABOLIC PANEL
BUN: 19 mg/dL (ref 6–23)
CO2: 27 mEq/L (ref 19–32)
Calcium: 8.8 mg/dL (ref 8.4–10.5)
Chloride: 96 mEq/L (ref 96–112)
Creatinine, Ser: 0.82 mg/dL (ref 0.50–1.10)
GFR calc Af Amer: 78 mL/min — ABNORMAL LOW (ref >90)
GFR calc non Af Amer: 67 mL/min — ABNORMAL LOW (ref >90)
Glucose, Bld: 123 mg/dL — ABNORMAL HIGH (ref 70–99)
Potassium: 4.1 mEq/L (ref 3.5–5.1)
Sodium: 132 mEq/L — ABNORMAL LOW (ref 135–145)

## 2013-03-09 LAB — CBC
HCT: 31.2 % — ABNORMAL LOW (ref 36.0–46.0)
Hemoglobin: 10.5 g/dL — ABNORMAL LOW (ref 12.0–15.0)
MCH: 29 pg (ref 26.0–34.0)
MCHC: 33.7 g/dL (ref 30.0–36.0)
MCV: 86.2 fL (ref 78.0–100.0)
Platelets: 340 10*3/uL (ref 150–400)
RBC: 3.62 MIL/uL — ABNORMAL LOW (ref 3.87–5.11)
RDW: 13.2 % (ref 11.5–15.5)
WBC: 14.6 10*3/uL — ABNORMAL HIGH (ref 4.0–10.5)

## 2013-03-09 MED ORDER — ASPIRIN 325 MG PO TBEC: 325.0000 mg | DELAYED_RELEASE_TABLET | Freq: Two times a day (BID) | ORAL | Status: DC

## 2013-03-09 MED ORDER — OXYCODONE HCL 5 MG PO TABS: 5.0000 mg | ORAL_TABLET | ORAL | Status: DC | PRN

## 2013-03-09 MED ORDER — FERROUS SULFATE 325 (65 FE) MG PO TABS: 325.0000 mg | ORAL_TABLET | Freq: Three times a day (TID) | ORAL | Status: DC

## 2013-03-09 MED ORDER — METHOCARBAMOL 500 MG PO TABS: 500.0000 mg | ORAL_TABLET | Freq: Three times a day (TID) | ORAL | Status: DC

## 2013-03-09 NOTE — Evaluation (Signed)
Occupational Therapy Evaluation Patient Details Name: Deborah Jordan MRN: [id number] DOB: [birth year] Today's Date: 03/09/2013 Time: [id number]    OT Assessment / Plan / Recommendation Clinical Impression  Pt presents to OT s/p anterior THA. Pt will benefit from skilled OT to increase I with ADL actiivty and return to PLOF    OT Assessment  Patient needs continued OT Services    Follow Up Recommendations  No OT follow up             Frequency  Min 2X/week    Precautions / Restrictions Precautions Precautions: None Restrictions Weight Bearing Restrictions: No LLE Weight Bearing: Weight bearing as tolerated Other Position/Activity Restrictions: WBAT       ADL  Upper Body Dressing: Performed;Supervision/safety Where Assessed - Upper Body Dressing: Unsupported standing Lower Body Dressing: Simulated;Moderate assistance Where Assessed - Lower Body Dressing: Supported sit to stand Toilet Transfer: Pharmacologist Method: Sit to stand;Other (comment) (bed to chair) Toileting - Clothing Manipulation and Hygiene: Performed;Minimal assistance Where Assessed - Toileting Clothing Manipulation and Hygiene: Standing    OT Diagnosis: Generalized weakness  OT Problem List: Decreased strength OT Treatment Interventions: Self-care/ADL training;Patient/family education;DME and/or AE instruction   OT Goals Acute Rehab OT Goals OT Goal Formulation: With patient Time For Goal Achievement: 03/23/13 ADL Goals Pt Will Perform Lower Body Dressing: with supervision;Sit to stand from chair ADL Goal: Lower Body Dressing - Progress: Goal set today Pt Will Transfer to Toilet: with modified independence;Comfort height toilet ADL Goal: Toilet Transfer - Progress: Goal set today Pt Will Perform Tub/Shower Transfer: Tub transfer;with supervision ADL Goal: Tub/Shower Transfer - Progress: Goal set today  Visit Information  Last OT Received On: 03/09/13 Assistance Needed:  +1    Subjective Data  Subjective: i am just dizzy and sick feeling   Prior Functioning     Home Living Lives With: Family Available Help at Discharge: Family Type of Home: House Home Access: Stairs to enter [employer] of Steps: 2 front and 4 back Entrance Stairs-Rails: None Home Layout: One level Bathroom Shower/Tub: Product/process development scientist: Standard Home Adaptive Equipment: Straight cane Prior Function Level of Independence: Independent Able to Take Stairs?: Yes Driving: Yes Communication Communication: No difficulties         Vision/Perception Vision - History Patient Visual Report: No change from baseline   Cognition  Cognition Overall Cognitive Status: Appears within functional limits for tasks assessed/performed Arousal/Alertness: Awake/alert Orientation Level: Appears intact for tasks assessed Behavior During Session: [facility] for tasks performed    Extremity/Trunk Assessment Right Upper Extremity Assessment RUE ROM/Strength/Tone: Within functional levels Left Upper Extremity Assessment LUE ROM/Strength/Tone: Within functional levels Right Lower Extremity Assessment RLE ROM/Strength/Tone: WFL for tasks assessed RLE Sensation: WFL - Light Touch Left Lower Extremity Assessment LLE ROM/Strength/Tone: Deficits LLE ROM/Strength/Tone Deficits: pt able to assist somewhat with hip add to get LE to EOB, however still requires some assist due to pain.  LLE Sensation: WFL - Light Touch Trunk Assessment Trunk Assessment: Kyphotic     Mobility Bed Mobility Bed Mobility: Supine to Sit Supine to Sit: 4: Min assist Transfers Transfers: Sit to Stand;Stand to Sit Sit to Stand: 4: Min guard;From bed;With upper extremity assist Stand to Sit: To chair/3-in-1;4: Min assist;With upper extremity assist;With armrests Details for Transfer Assistance: verbal cues for hand placement           End of Session OT - End of Session Activity  Tolerance: Patient tolerated treatment well Patient left: in chair;with call bell/phone within  reach;with family/visitor present Nurse Communication: Mobility status  GO     Betsy Pries 03/09/2013, 10:22 AM

## 2013-03-09 NOTE — Evaluation (Signed)
Physical Therapy Evaluation Patient Details Name: Deborah Jordan MRN: [id number] DOB: 03-09-1933 Today's Date: 03/09/2013 Time: [id number] PT Time Calculation (min): 24 min  PT Assessment / Plan / Recommendation Clinical Impression  Pt presents s/p L THA (direct ant) POD 1 with decreased strength, ROM and mobility.  Tolerated OOB to chair, however deferred ambulation due to increased nausea.  RN aware and to give nausea meds.  Pt will benefit from skilled PT in acute venue to addres deficits.  PT recommends HHPT for follow up at D/C to maximize pts safety and function.     PT Assessment  Patient needs continued PT services    Follow Up Recommendations  Home health PT    Does the patient have the potential to tolerate intense rehabilitation      Barriers to Discharge None      Equipment Recommendations  Rolling walker with 5" wheels    Recommendations for Other Services     Frequency 7X/week    Precautions / Restrictions Precautions Precautions: None Restrictions Weight Bearing Restrictions: No LLE Weight Bearing: Weight bearing as tolerated Other Position/Activity Restrictions: WBAT   Pertinent Vitals/Pain No pain, just increased nausea.       Mobility  Bed Mobility Bed Mobility: Supine to Sit Supine to Sit: 4: Min assist;HOB elevated Details for Bed Mobility Assistance: Assist for LLE out of bed with min cues for hand placement to self assist trunk to EOB.  Transfers Transfers: Sit to Stand;Stand to Sit;Stand Pivot Transfers Sit to Stand: 4: Min assist;From elevated surface;With upper extremity assist;From bed Stand to Sit: 4: Min assist;With upper extremity assist;With armrests;To chair/3-in-1 Stand Pivot Transfers: 4: Min assist Details for Transfer Assistance: Performed x 2 due to rest break from nausea.  Assist to steady throughout with cues for hand placement and LE management.  Pt too nauseated to attempt amb but able to get to chair from bed with min assist and  cues for sequencing/technique with RW.  Ambulation/Gait Ambulation/Gait Assistance: Not tested (comment) Assistive device: Rolling walker Gait Pattern: Step-to pattern;Decreased stride length;Trunk flexed;Antalgic Gait velocity: decreased    Exercises     PT Diagnosis: Difficulty walking;Generalized weakness;Acute pain  PT Problem List: Decreased strength;Decreased range of motion;Decreased activity tolerance;Decreased balance;Decreased mobility;Decreased coordination;Decreased knowledge of use of DME;Decreased safety awareness;Pain PT Treatment Interventions: DME instruction;Gait training;Stair training;Functional mobility training;Therapeutic activities;Therapeutic exercise;Balance training;Patient/family education   PT Goals Acute Rehab PT Goals PT Goal Formulation: With patient Time For Goal Achievement: 03/13/13 Potential to Achieve Goals: Good Pt will go Supine/Side to Sit: with supervision PT Goal: Supine/Side to Sit - Progress: Goal set today Pt will go Sit to Supine/Side: with supervision PT Goal: Sit to Supine/Side - Progress: Goal set today Pt will go Sit to Stand: with supervision PT Goal: Sit to Stand - Progress: Goal set today Pt will go Stand to Sit: with supervision PT Goal: Stand to Sit - Progress: Goal set today Pt will Ambulate: 51 - 150 feet;with supervision;with least restrictive assistive device PT Goal: Ambulate - Progress: Goal set today Pt will Go Up / Down Stairs: 1-2 stairs;with min assist;with least restrictive assistive device PT Goal: Up/Down Stairs - Progress: Goal set today  Visit Information  Last PT Received On: 03/09/13 Assistance Needed: +1    Subjective Data  Subjective: I'm just so nauseous Patient Stated Goal: to return home.    Prior Functioning  Home Living Lives With: Family Available Help at Discharge: Family Type of Home: House Home Access: Stairs to enter [employer]  of Steps: 2 front and 4 back Entrance  Stairs-Rails: None Home Layout: One level Bathroom Shower/Tub: Tub/shower unit;Curtain Biochemist, clinical: Standard Home Adaptive Equipment: Straight cane Prior Function Level of Independence: Independent Able to Take Stairs?: Yes Driving: Yes Communication Communication: No difficulties    Cognition  Cognition Overall Cognitive Status: Appears within functional limits for tasks assessed/performed Arousal/Alertness: Awake/alert Orientation Level: Appears intact for tasks assessed Behavior During Session: [facility] for tasks performed    Extremity/Trunk Assessment Right Upper Extremity Assessment RUE ROM/Strength/Tone: Within functional levels Left Upper Extremity Assessment LUE ROM/Strength/Tone: Within functional levels Right Lower Extremity Assessment RLE ROM/Strength/Tone: WFL for tasks assessed RLE Sensation: WFL - Light Touch Left Lower Extremity Assessment LLE ROM/Strength/Tone: Deficits LLE ROM/Strength/Tone Deficits: pt able to assist somewhat with hip add to get LE to EOB, however still requires some assist due to pain.  LLE Sensation: WFL - Light Touch Trunk Assessment Trunk Assessment: Kyphotic   Balance    End of Session PT - End of Session Activity Tolerance: Other (comment) (limited by nausea) Patient left: in chair;with call bell/phone within reach;with family/visitor present Nurse Communication: Mobility status  GP     Denice Bors 03/09/2013, 10:27 AM

## 2013-03-09 NOTE — Progress Notes (Signed)
Patient ID: Deborah Jordan, female   DOB: 1933-01-18, 79 y.o.   MRN: [id number] Subjective: 1 Day Post-Op Procedure(s) (LRB): LEFT TOTAL HIP ARTHROPLASTY ANTERIOR APPROACH (Left) Awake, alert and Ox4. Lots of family in room. Pain is mild. When can I go home?  Patient reports pain as mild.    Objective:   VITALS:  Temp:  [96.8 F (36 C)-97.8 F (36.6 C)] 96.8 F (36 C) (03/22 1017) Pulse Rate:  [54-70] 63 (03/22 1017) Resp:  [6-26] 16 (03/22 1200) BP: (103-138)/(49-76) 103/64 mmHg (03/22 1017) SpO2:  [84 %-100 %] 94 % (03/22 1200) Weight:  [68.04 kg (150 lb)] 68.04 kg (150 lb) (03/22 0309)  Neurologically intact ABD soft Neurovascular intact Sensation intact distally Intact pulses distally Dorsiflexion/Plantar flexion intact Incision: dressing C/D/I   LABS  Recent Labs  03/09/13 0530  HGB 10.5*  WBC 14.6*  PLT 340    Recent Labs  03/09/13 0530  NA 132*  K 4.1  CL 96  CO2 27  BUN 19  CREATININE 0.82  GLUCOSE 123*   No results found for this basename: LABPT, INR,  in the last 72 hours   Assessment/Plan: 1 Day Post-Op Procedure(s) (LRB): LEFT TOTAL HIP ARTHROPLASTY ANTERIOR APPROACH (Left)  Advance diet Up with therapy Plan for discharge tomorrow Discharge home with home health  Doyal Saric E 03/09/2013, 2:28 PM

## 2013-03-09 NOTE — Progress Notes (Signed)
Physical Therapy Treatment Patient Details Name: Deborah Jordan MRN: [id number] DOB: Feb 15, 1933 Today's Date: 03/09/2013 Time: [id number] PT Time Calculation (min): 19 min  PT Assessment / Plan / Recommendation Comments on Treatment Session  Pt progressing very well with mobility and had ambulated with nursing again prior to PT session.     Follow Up Recommendations  Home health PT     Does the patient have the potential to tolerate intense rehabilitation     Barriers to Discharge        Equipment Recommendations  Rolling walker with 5" wheels    Recommendations for Other Services    Frequency 7X/week   Plan Discharge plan remains appropriate    Precautions / Restrictions Precautions Precautions: None Restrictions Weight Bearing Restrictions: No Other Position/Activity Restrictions: WBAT   Pertinent Vitals/Pain 4/10, pt premedicated, ice pack applied    Mobility  Bed Mobility Bed Mobility: Supine to Sit;Sit to Supine Supine to Sit: 4: Min guard;HOB flat Sit to Supine: 4: Min assist;HOB flat Details for Bed Mobility Assistance: Guarding assist for LLE out of bed, with increased assist for LLE into bed and min cues for technique.   Transfers Transfers: Sit to Stand;Stand to Sit;Stand Pivot Transfers Sit to Stand: 4: Min guard;From elevated surface;With upper extremity assist;From bed Stand to Sit: 4: Min guard;With upper extremity assist;To bed Details for Transfer Assistance: Mod cues for hand placement and LE management when sitting/standing.  Ambulation/Gait Ambulation/Gait Assistance: 4: Min guard Ambulation Distance (Feet): 60 Feet Assistive device: Rolling walker Ambulation/Gait Assistance Details: Min cues for sequencing/technique with RW, step to vs step through gait pattern and upright posture.  Gait Pattern: Step-to pattern;Decreased stride length;Trunk flexed;Antalgic Gait velocity: decreased    Exercises Total Joint Exercises Ankle Circles/Pumps:  AROM;Both;10 reps Quad Sets: AROM;Left;10 reps Short Arc Quad: AROM;Left;10 reps Heel Slides: AAROM;Left;10 reps Hip ABduction/ADduction: AAROM;Left;10 reps   PT Diagnosis:    PT Problem List:   PT Treatment Interventions:     PT Goals Acute Rehab PT Goals PT Goal Formulation: With patient Time For Goal Achievement: 03/13/13 Potential to Achieve Goals: Good Pt will go Supine/Side to Sit: with supervision PT Goal: Supine/Side to Sit - Progress: Progressing toward goal Pt will go Sit to Supine/Side: with supervision PT Goal: Sit to Supine/Side - Progress: Progressing toward goal Pt will go Sit to Stand: with supervision PT Goal: Sit to Stand - Progress: Progressing toward goal Pt will go Stand to Sit: with supervision PT Goal: Stand to Sit - Progress: Progressing toward goal Pt will Ambulate: 51 - 150 feet;with supervision;with least restrictive assistive device PT Goal: Ambulate - Progress: Progressing toward goal  Visit Information  Last PT Received On: 03/09/13 Assistance Needed: +1    Subjective Data  Subjective: I just walked, but I'll try again.  Patient Stated Goal: to return home.    Cognition  Cognition Overall Cognitive Status: Appears within functional limits for tasks assessed/performed Arousal/Alertness: Awake/alert Orientation Level: Appears intact for tasks assessed Behavior During Session: [facility] for tasks performed    Balance     End of Session PT - End of Session Activity Tolerance: Patient tolerated treatment well Patient left: in bed;with call bell/phone within reach;with family/visitor present Nurse Communication: Mobility status   GP     Denice Bors 03/09/2013, 3:50 PM

## 2013-03-09 NOTE — Op Note (Signed)
NAMEMCKAYLYN, DOESCHER                 ACCOUNT NO.:  [id number]  MEDICAL RECORD NO.:  [id number]  LOCATION:  1519                         FACILITY:  [facility]  PHYSICIAN:  Lind Guest. Ninfa Linden, M.D.DATE OF BIRTH:  1933/07/15  DATE OF PROCEDURE:  03/08/2013 DATE OF DISCHARGE:                              OPERATIVE REPORT   PREOPERATIVE DIAGNOSIS:  Severe end-stage arthritis and degenerative joint disease, left hip.  POSTOP DIAGNOSIS:  Severe end-stage arthritis and degenerative joint disease, left hip.  PROCEDURE:  Left total hip arthroplasty through direct anterior approach.  SURGEON:  Lind Guest. Ninfa Linden, M.D.  ASSISTANT:  Erskine Emery, P.A.  ANESTHESIA:  General.  BLOOD LOSS:  Less than 500 mL.  ANTIBIOTICS:  2 g IV Ancef.  COMPLICATIONS:  None.  INDICATIONS:  Ms. Wiatrowski is a very pleasant 79 year old female, who has had less than a year history of severe pain involving her left hip, but x-rays that have shown severe loss of her left hip joint space, periarticular osteophytes, subchondral sclerosis, and subchondral cysts. She ambulates with severe limp.  Her activities of daily living are greatly limited in her pain is daily.  Her mobility is severely limited as well.  She wished to proceed with a total hip arthroplasty at this point.  She has had injections and anti-inflammatories, and this has not helped.  The risks and benefits of surgery explained to her in detail and she does wish to proceed.  DESCRIPTION OF PROCEDURE:  After informed consent was obtained, appropriate left hip was marked.  She was brought to the operating room, and general anesthesia was then obtained.  A Foley catheter was placed and both feet had traction boots applied to them.  She was then placed supine on the Hana fracture table with the perineal post in place and both legs in inline skeletal traction with no traction applied.  Her left hip was then prepped and draped with DuraPrep and  sterile drapes. Time-out was called.  She was identified as correct patient, correct left hip.  We then made an incision just inferior and posterior to the anterior-superior iliac spine and carried this obliquely down the leg, and dissected down to the tensor fascia lata.  The tensor fascia was then divided longitudinally.  I proceeded with a direct anterior approach to the hip at that standpoint.  The cobra retractors were placed around the lateral neck and up underneath the rectus femoris, a medial retractor was placed.  I cauterized the lateral femoral circumflex vessels and I then opened up the hip capsule L type format. Cobra retractors were placed within the hip capsule.  I then made my femoral neck cut just proximal to the lesser trochanter and finished this off with an osteotome.  I then used a corkscrew guide in the femoral head.  I removed the femoral head in its entirety and found it to be completely devoid of cartilage.  I then placed the Bent Hohmann retractor medially and a Cobra retractor laterally.  I cleaned the acetabulum debris including remnants of the labrum and the ligament.  I then began reaming from a size 42 reamer only up to a size 48 because she was quite  small.  The size 48 was then stable.  All reamers were placed under direct visualization, and last reamer was also placed under direct fluoroscopy, so I could obtain my depth of reaming, my inclination, and anteversion.  I then placed the real size 48 diffuse Sector Gription acetabular component and then the real apex hole eliminator guide and a 32+ 4 neutral polyethylene liner.  Attention was then turned to the femur.  With the leg externally rotated to 90 degrees, extended and adducted, I used a Mueller retractor medially and Hohmann retractor behind the greater trochanter.  I then used a box cutting guide to open the femoral canal, rongeured and lateralized.  I began broaching from a size 8 broach up to a  size 12, where the size 12 was felt to be stable, so I trialed a standard neck and a 32+ 1 hip ball.  I felt like we needed to sit flat more offset in length, so we then trialed a 32+ 5 hip ball and this gave her leg lengths equal and better offset.  Her rotation was stable as well and her leg lengths were equal under direct fluoroscopy.  We then dislocated the hip again and removed the trial components.  I then placed the real Corail size 12 femoral component followed by the 32+ 5 metal hip ball.  We reduced this back into the pelvis, stable with rotation and leg lengths were equal as well.  We then copiously irrigated the hip joint with normal saline solution using pulsatile lavage.  We closed the hip capsule with interrupted #1 Ethibond suture followed by a #1 Vicryl and V-lock in the tensor fascia, 0 Vicryl on the deep tissue, 2-0 Vicryl in subcutaneous tissue, a 4-0 Monocryl subcuticular stitch and Dermabond on the skin. Well-padded sterile dressing was applied.  She was then taken off the Hana table, awakened, extubated, and taken to the recovery room in stable condition.  All final counts were correct.  There were no complications noted.  Of note, Carney Bern, PA-C was present during the entire case and his presence was integral in positioning the patient, exposure of the hip joint, retraction of tissues and closure of the wound.     Lind Guest. Ninfa Linden, M.D.     CYB/MEDQ  D:  03/08/2013  T:  03/09/2013  Job:  [id number]

## 2013-03-10 ENCOUNTER — Inpatient Hospital Stay (HOSPITAL_COMMUNITY): Payer: Medicare Other

## 2013-03-10 DIAGNOSIS — E871 Hypo-osmolality and hyponatremia: Secondary | ICD-10-CM

## 2013-03-10 DIAGNOSIS — M169 Osteoarthritis of hip, unspecified: Secondary | ICD-10-CM

## 2013-03-10 DIAGNOSIS — I10 Essential (primary) hypertension: Secondary | ICD-10-CM

## 2013-03-10 DIAGNOSIS — J90 Pleural effusion, not elsewhere classified: Secondary | ICD-10-CM

## 2013-03-10 LAB — TSH: TSH: 1.449 u[IU]/mL (ref 0.350–4.500)

## 2013-03-10 LAB — BASIC METABOLIC PANEL
BUN: 16 mg/dL (ref 6–23)
CO2: 28 mEq/L (ref 19–32)
Calcium: 8.5 mg/dL (ref 8.4–10.5)
Chloride: 90 mEq/L — ABNORMAL LOW (ref 96–112)
Creatinine, Ser: 0.84 mg/dL (ref 0.50–1.10)
GFR calc Af Amer: 76 mL/min — ABNORMAL LOW (ref >90)
GFR calc non Af Amer: 65 mL/min — ABNORMAL LOW (ref >90)
Glucose, Bld: 148 mg/dL — ABNORMAL HIGH (ref 70–99)
Potassium: 4.1 mEq/L (ref 3.5–5.1)
Sodium: 125 mEq/L — ABNORMAL LOW (ref 135–145)

## 2013-03-10 LAB — CBC
HCT: 28.5 % — ABNORMAL LOW (ref 36.0–46.0)
Hemoglobin: 9.6 g/dL — ABNORMAL LOW (ref 12.0–15.0)
MCH: 29.1 pg (ref 26.0–34.0)
MCHC: 33.7 g/dL (ref 30.0–36.0)
MCV: 86.4 fL (ref 78.0–100.0)
Platelets: 308 10*3/uL (ref 150–400)
RBC: 3.3 MIL/uL — ABNORMAL LOW (ref 3.87–5.11)
RDW: 13.3 % (ref 11.5–15.5)
WBC: 12.1 10*3/uL — ABNORMAL HIGH (ref 4.0–10.5)

## 2013-03-10 MED ORDER — AZITHROMYCIN 250 MG PO TABS: ORAL_TABLET | ORAL | Status: DC

## 2013-03-10 NOTE — Progress Notes (Addendum)
Patient ID: Deborah Jordan, female   DOB: 1933-07-17, 79 y.o.   MRN: [id number] Subjective: 2 Days Post-Op Procedure(s) (LRB): LEFT TOTAL HIP ARTHROPLASTY ANTERIOR APPROACH (Left) Awake, alert and Ox4. Up with PT in the hallway. Tolerating po meds and oral nutrition. Complains of productive cough, yellow color occurring with incentive spirometry. Patient reports pain as mild.    Objective:   VITALS:  Temp:  [96.8 F (36 C)-98.7 F (37.1 C)] 98.5 F (36.9 C) (03/23 0600) Pulse Rate:  [63-93] 82 (03/23 0600) Resp:  [16-18] 18 (03/23 0759) BP: (100-124)/(57-65) 106/57 mmHg (03/23 0600) SpO2:  [94 %-96 %] 96 % (03/23 0759)  Neurologically intact ABD soft Neurovascular intact Sensation intact distally Intact pulses distally Dorsiflexion/Plantar flexion intact Incision: no drainage No cellulitis present Ascultation of lung fields no rales or rhonchi.   LABS  Recent Labs  03/09/13 0530 03/10/13 0522  HGB 10.5* 9.6*  WBC 14.6* 12.1*  PLT 340 308    Recent Labs  03/09/13 0530  NA 132*  K 4.1  CL 96  CO2 27  BUN 19  CREATININE 0.82  GLUCOSE 123*   No results found for this basename: LABPT, INR,  in the last 72 hours   Assessment/Plan: 2 Days Post-Op Procedure(s) (LRB): LEFT TOTAL HIP ARTHROPLASTY ANTERIOR APPROACH (Left)  Advance diet Up with therapy Discharge home with home health Plan to obtain CXR today prior to d/c and consider z-pack. 3 in 1 commode seat requested by CM.  Daymien Goth E 03/10/2013, 9:34 AM

## 2013-03-10 NOTE — Progress Notes (Signed)
Physical Therapy Treatment Patient Details Name: Deborah Jordan MRN: [id number] DOB: 1933-12-12 Today's Date: 03/10/2013 Time: [id number] PT Time Calculation (min): 23 min  PT Assessment / Plan / Recommendation Comments on Treatment Session  Pt progressing with mobility, however is nauseated again this morning.  RN notified.  Also RN states that pt has been coughing up brown/green mucous this morning and MD to perform chest xray.  If all cleared, pt may D/C today after stair training.     Follow Up Recommendations  Home health PT     Does the patient have the potential to tolerate intense rehabilitation     Barriers to Discharge        Equipment Recommendations  Rolling walker with 5" wheels (3in1 bedside commode)    Recommendations for Other Services    Frequency 7X/week   Plan Discharge plan remains appropriate    Precautions / Restrictions Precautions Precautions: None Restrictions Weight Bearing Restrictions: No LLE Weight Bearing: Weight bearing as tolerated Other Position/Activity Restrictions: WBAT   Pertinent Vitals/Pain No pain, just nausea    Mobility  Bed Mobility Bed Mobility: Supine to Sit Supine to Sit: 5: Supervision;HOB elevated Details for Bed Mobility Assistance: Pt able to sit upright and self assist LLE with UEs to get to EOB.   Transfers Transfers: Sit to Stand;Stand to [employer] Transfers Sit to Stand: 5: Supervision;With upper extremity assist;From bed;From toilet Stand to Sit: 5: Supervision;With upper extremity assist;With armrests;To chair/3-in-1;To toilet Details for Transfer Assistance: Max cues for hand placement when standing and when in restroom.  Performed x 2 in order to use toilet.  Pt feels that toilet at home may be lower and will benefit from 3in1 at home.  Ambulation/Gait Ambulation/Gait Assistance: 4: Min guard Ambulation Distance (Feet): 100 Feet Assistive device: Rolling walker Ambulation/Gait Assistance Details: Continued  cues for sequencing/technique as pt tends to get off sequence and let RW get too far ahead of her.  Also cues for upright posture.  Gait Pattern: Step-to pattern;Decreased stride length;Trunk flexed;Antalgic Gait velocity: decreased    Exercises     PT Diagnosis:    PT Problem List:   PT Treatment Interventions:     PT Goals Acute Rehab PT Goals PT Goal Formulation: With patient Time For Goal Achievement: 03/13/13 Potential to Achieve Goals: Good Pt will go Supine/Side to Sit: with supervision PT Goal: Supine/Side to Sit - Progress: Met Pt will go Sit to Stand: with supervision PT Goal: Sit to Stand - Progress: Met Pt will go Stand to Sit: with supervision PT Goal: Stand to Sit - Progress: Met Pt will Ambulate: 51 - 150 feet;with supervision;with least restrictive assistive device PT Goal: Ambulate - Progress: Progressing toward goal  Visit Information  Last PT Received On: 03/10/13 Assistance Needed: +1    Subjective Data  Subjective: I feel nausous again this morning.  Patient Stated Goal: to return home.    Cognition  Cognition Overall Cognitive Status: Appears within functional limits for tasks assessed/performed Arousal/Alertness: Awake/alert Orientation Level: Appears intact for tasks assessed Behavior During Session: [facility] for tasks performed    Balance     End of Session PT - End of Session Activity Tolerance: Other (comment) (Limited by nausea) Patient left: in chair;with call bell/phone within reach;with family/visitor present Nurse Communication: Mobility status   GP     Denice Bors 03/10/2013, 10:04 AM

## 2013-03-10 NOTE — Progress Notes (Signed)
Physical Therapy Treatment Patient Details Name: Deborah Jordan MRN: [id number] DOB: Mar 15, 1933 Today's Date: 03/10/2013 Time: [id number] PT Time Calculation (min): 28 min  PT Assessment / Plan / Recommendation Comments on Treatment Session  Pt progressing well with all mobility and practiced stairs.  Pt did well and educated many family members on how to assist pt with stairs.  All return/verbalize understanding.   Pt appropriate for D/C on mobility standpoint.     Follow Up Recommendations  Home health PT     Does the patient have the potential to tolerate intense rehabilitation     Barriers to Discharge        Equipment Recommendations  Rolling walker with 5" wheels    Recommendations for Other Services    Frequency 7X/week   Plan Discharge plan remains appropriate    Precautions / Restrictions Precautions Precautions: None Restrictions Weight Bearing Restrictions: No LLE Weight Bearing: Weight bearing as tolerated Other Position/Activity Restrictions: WBAT   Pertinent Vitals/Pain No pain.  Some nausea, however cleared during session.     Mobility  Bed Mobility Bed Mobility: Not assessed Transfers Transfers: Sit to Stand;Stand to Sit;Stand Pivot Transfers Sit to Stand: 5: Supervision;With upper extremity assist;With armrests;From chair/3-in-1;From toilet Stand to Sit: 5: Supervision;With upper extremity assist;With armrests;To chair/3-in-1;To toilet Details for Transfer Assistance: Performed x 2 in order to use restroom with cues for hand placement.  Ambulation/Gait Ambulation/Gait Assistance: 5: Supervision Ambulation Distance (Feet): 120 Feet Assistive device: Rolling walker Ambulation/Gait Assistance Details: Min cues for sequencing/technique with RW and to maintain upright posture.  Also encouraged pt to increase WB on LLE.  Gait Pattern: Step-to pattern;Decreased stride length;Trunk flexed;Antalgic Gait velocity: decreased Stairs: Yes Stairs Assistance: 4: Min  guard Stair Management Technique: No rails;Step to pattern;Backwards;Forwards;With walker Number of Stairs: 4    Exercises Total Joint Exercises Ankle Circles/Pumps: AROM;Both;20 reps Quad Sets: AROM;Left;10 reps Heel Slides: AAROM;Left;10 reps Hip ABduction/ADduction: AAROM;Left;10 reps   PT Diagnosis:    PT Problem List:   PT Treatment Interventions:     PT Goals Acute Rehab PT Goals PT Goal Formulation: With patient Time For Goal Achievement: 03/13/13 Potential to Achieve Goals: Good Pt will go Sit to Stand: with supervision PT Goal: Sit to Stand - Progress: Met Pt will go Stand to Sit: with supervision PT Goal: Stand to Sit - Progress: Met Pt will Ambulate: 51 - 150 feet;with supervision;with least restrictive assistive device PT Goal: Ambulate - Progress: Met Pt will Go Up / Down Stairs: 1-2 stairs;with min assist;with least restrictive assistive device PT Goal: Up/Down Stairs - Progress: Met  Visit Information  Last PT Received On: 03/10/13 Assistance Needed: +1    Subjective Data  Subjective: I still want to go home today if I can.  Patient Stated Goal: to return home.    Cognition  Cognition Overall Cognitive Status: Appears within functional limits for tasks assessed/performed Arousal/Alertness: Awake/alert Orientation Level: Appears intact for tasks assessed Behavior During Session: [facility] for tasks performed    Balance     End of Session PT - End of Session Activity Tolerance: Patient tolerated treatment well Patient left: in chair;with call bell/phone within reach;with family/visitor present Nurse Communication: Mobility status   GP     Denice Bors 03/10/2013, 2:31 PM

## 2013-03-10 NOTE — Progress Notes (Signed)
Patient ID: Deborah Jordan, female   DOB: December 01, 1933, 79 y.o.   MRN: [id number] Results of Chest xray shows small bilateral pleural effusions. I will contact the [facility] and request a consult. Cancel  Planned Discharge for today.

## 2013-03-10 NOTE — Care Management Note (Addendum)
    Page 1 of 2   03/11/2013     1:02:33 PM   CARE MANAGEMENT NOTE 03/11/2013  Patient:  Deborah Jordan, Deborah Jordan   Account Number:  [id number]  Date Initiated:  03/10/2013  Documentation initiated by:  Dessa Phi  Subjective/Objective Assessment:   ADMITTED W/DJD L HIP.     Action/Plan:   FROM HOME.   Anticipated DC Date:  03/11/2013   Anticipated DC Plan:  [place]  CM consult      Choice offered to / List presented to:  C-1 Patient   DME arranged  [place]      DME agency  [place] arranged  [place].   Status of service:  Completed, signed off Medicare Important Message given?  NA - LOS <3 / Initial given by admissions (If response is "NO", the following Medicare IM given date fields will be blank) Date Medicare IM given:   Date Additional Medicare IM given:    Discharge Disposition:  [place]  Per UR Regulation:  Reviewed for med. necessity/level of care/duration of stay  If discussed at [place] of Stay Meetings, dates discussed:    Comments:  03/11/2013 Deborah Jordan BSN RN CCM [phone] CM spoke with patient and 2 family members. Plans are for her to return to her home in [place] where famjily members will be caregivers. She already has RW and 3n1 has been deliveredto her room. [facility] will provide HHpt services with start date of tomorrow.   03/10/13 KATHY MAHABIR RN,BSN NCM WEEKEND TEL#[phone] [facility] CHOSEN FOR HHPT.FAXED W/CONFIRMATION TO FAX#[phone] HHPT ORDER.[facility] DME JERMAINE (REP) INFORMED OF RW,3N1 ORDER FOR DELIVERY TO PATIENT'S RM PRIOR TO D/C.

## 2013-03-10 NOTE — Consult Note (Addendum)
[facility] Medical Consultation  Deborah Jordan [id number] DOB: [birth year] DOA: 03/08/2013 PCP: Lilian Coma, MD   Requesting physician: Dr. Louanne Skye Date of consultation: 03/10/13 Reason for consultation: pleural effusion  Impression/Recommendations Trace pleural effusions on x-ray -Deborah Jordan only received 1 L crystalloid during surgery -Clinically, Deborah Jordan is not volume overloaded -Deborah Jordan without any hypoxemia or distress -Would not treat with any diuretics at this time -Check echocardiogram although clinical heart exam does not have any worrisome murmurs or dysrhythmia -If Deborah Jordan has normal ejection fraction without any significant wall motion abnormalities, I would defer any further workup at this time -Preoperative EKG was reassuring -if Echo is unremarkable and pt remains clinically stable without hypoxemia, she can likely go home without further workup or therapies Hyponatremia -Check TSH -Clinically appears euvolemic -Recheck BMP -Check urine creatinine and urine sodium Hypertension -Controlled -Continue ARB I will followup again tomorrow. Please contact me if I can be of assistance in the meanwhile. Thank you for this consultation.   Chief Complaint: cough with yellow sputum  HPI:  79 year old female with history of hypertension and gastroesophageal reflux disease presented on 03/08/2013 for elective left total hip arthroplasty secondary to osteoarthritis. The surgery went well, and the Deborah Jordan's postoperative course was unremarkable. The Deborah Jordan was on supplemental oxygen postoperatively and has been weaned off of it. Yesterday, the Deborah Jordan began using incentive spirometry. After this, the Deborah Jordan began complaining of a cough with yellow sputum. She states that her cough and sputum production is actually look better today. She does have some dyspnea with while working with physical therapy, but she states that this is not any worse than usual even when compared to  her preoperative functional status. She denies any chest pain, fevers, chills, nausea, vomiting, abdominal pain, diarrhea. She states that she does have a little dizziness when changing positions. Because of the Deborah Jordan's cough and sputum production, chest x-ray was obtained and showed trace amount of pleural effusion on the lateral projection. The Deborah Jordan denies any worsening peripheral edema, abdominal swelling, orthopnea, PND, hemoptysis. Assessment for cough, the Deborah Jordan states that she is not short of breath. Lab work has shown mild hyponatremia with a serum sodium of 132 and mild leukocytosis which is improving. Preoperative UA was unremarkable. Preoperative EKG was unremarkable. The Deborah Jordan has remained hemodynamically stable without any hypoxemia or distress.  Review of Systems:  Constitutional:  No weight loss, night sweats, Fevers, chills, fatigue.  Head&Eyes: No headache.  No vision loss.  No eye pain or scotoma ENT:  No Difficulty swallowing,Tooth/dental problems,Sore throat,  No ear ache, post nasal drip,  Cardio-vascular:  No chest pain, Orthopnea, PND, swelling in lower extremities,   palpitations  GI:  No heartburn, indigestion, abdominal pain, nausea, vomiting, diarrhea, loss of appetite, hematochezia, melena Resp:  No shortness of breath  at rest. No coughing up of blood. No wheezing.No chest wall deformity  Skin:  no rash or lesions.  GU:  no dysuria, change in color of urine, no urgency or frequency. No flank pain.  Musculoskeletal:  No joint pain or swelling. No decreased range of motion. No back pain.  Psych:  No change in mood or affect. No depression or anxiety. Neurologic: No headache, no dysesthesia, no focal weakness, no vision loss. No syncope   Past Medical History  Diagnosis Date  . Hypertension   . GERD (gastroesophageal reflux disease)     controls with Nexium  . Arthritis     osteoarthritis. spinal stenosis. Scoliosis of spine-degenerative spine.    Past  Surgical History  Procedure Laterality Date  . Cholecystectomy    . Cataract extraction  03-06-13    left eye  . Dilation and curettage of uterus     Social History:  reports that she has never smoked. She does not have any smokeless tobacco history on file. She reports that she does not drink alcohol or use illicit drugs.  History reviewed. No pertinent family history.  No Known Allergies   Prior to Admission medications   Medication Sig Start Date End Date Taking? Authorizing Provider  esomeprazole (NEXIUM) 40 MG capsule Take 40 mg by mouth as needed (indigestion).   Yes Historical Provider, MD  naproxen sodium (ANAPROX) 220 MG tablet Take 220 mg by mouth 2 (two) times daily with a meal.   Yes Historical Provider, MD  valsartan (DIOVAN) 320 MG tablet Take 320 mg by mouth daily before breakfast.   Yes Historical Provider, MD  aspirin EC 325 MG EC tablet Take 1 tablet (325 mg total) by mouth 2 (two) times daily. 03/09/13   Jessy Oto, MD  azithromycin (ZITHROMAX Z-PAK) 250 MG tablet Take 2 tab po day#1 and then one tablet po each day thereafter. 03/10/13   Jessy Oto, MD  ferrous sulfate 325 (65 FE) MG tablet Take 1 tablet (325 mg total) by mouth 3 (three) times daily after meals. 03/09/13   Jessy Oto, MD  methocarbamol (ROBAXIN) 500 MG tablet Take 1 tablet (500 mg total) by mouth 3 (three) times daily. 03/09/13   Jessy Oto, MD  oxyCODONE (OXY IR/ROXICODONE) 5 MG immediate release tablet Take 1-2 tablets (5-10 mg total) by mouth every 4 (four) hours as needed for pain. 03/09/13   Jessy Oto, MD    Physical Exam: Filed Vitals:   03/09/13 1810 03/09/13 2200 03/10/13 0600 03/10/13 0759  BP: 120/65 100/60 106/57   Pulse: 70 93 82   Temp: 98.7 F (37.1 C) 98.5 F (36.9 C) 98.5 F (36.9 C)   TempSrc: Oral Oral Oral   Resp: 16 18 18 18   Height:      Weight:      SpO2: 94% 94% 95% 96%   General:  A&O x 3, NAD, nontoxic, pleasant/cooperative Head/Eye: No  conjunctival hemorrhage, no icterus, [place]/AT, No nystagmus ENT:  No icterus,  No thrush, good dentition, no pharyngeal exudate Neck:  No masses, no lymphadenpathy, no bruits CV:  RRR, no rub, no gallop, no S3 Lung:  CTAB, good air movement, no wheeze, no rhonchi Abdomen: soft/NT, +BS, nondistended, no peritoneal signs Ext: No cyanosis, No rashes, No petechiae, No lymphangitis, No edema   Labs on Admission:  Basic Metabolic Panel:  Recent Labs Lab 03/06/13 1150 03/09/13 0530  NA 131* 132*  K 4.8 4.1  CL 96 96  CO2 24 27  GLUCOSE 93 123*  BUN 21 19  CREATININE 0.93 0.82  CALCIUM 9.7 8.8   Liver Function Tests: No results found for this basename: AST, ALT, ALKPHOS, BILITOT, PROT, ALBUMIN,  in the last 168 hours No results found for this basename: LIPASE, AMYLASE,  in the last 168 hours No results found for this basename: AMMONIA,  in the last 168 hours CBC:  Recent Labs Lab 03/06/13 1150 03/09/13 0530 03/10/13 0522  WBC 8.5 14.6* 12.1*  HGB 13.3 10.5* 9.6*  HCT 39.3 31.2* 28.5*  MCV 85.8 86.2 86.4  PLT 429* 340 308   Cardiac Enzymes: No results found for this basename: CKTOTAL, CKMB, CKMBINDEX, TROPONINI,  in the last  168 hours BNP: No components found with this basename: POCBNP,  CBG: No results found for this basename: GLUCAP,  in the last 168 hours  Radiological Exams on Admission: Dg Chest 2 View  03/10/2013  *RADIOLOGY REPORT*  Clinical Data: Cough and congestion  CHEST - 2 VIEW  Comparison: None.  Findings: Normal mediastinum and cardiac silhouette.  There is trace pleural effusions seen laterally.  No effusion, infiltrate, or pneumothorax. Degenerative osteophytosis of the thoracic spine.  IMPRESSION: Small effusions.  No pulmonary edema or infiltrate.   Original Report Authenticated By: Suzy Bouchard, M.D.    Dg Pelvis Portable  03/08/2013  *RADIOLOGY REPORT*  Clinical Data: 79 year old female status post left hip surgery.  PORTABLE PELVIS  Comparison:  Intraoperative images 1209 hours the same day.  Findings: Portable AP view at 1437 hours.  Sequelae of bipolar left hip arthroplasty.  Hardware appears intact and alignment within normal limits on this single view.  No unexpected osseous changes. Postoperative changes to the surrounding soft tissues.  IMPRESSION: Left hip arthroplasty with no adverse features identified.   Original Report Authenticated By: Roselyn Reef, M.D.    Dg Hip Portable 1 View Left  03/08/2013  *RADIOLOGY REPORT*  Clinical Data: Left hip replacement.  PORTABLE LEFT HIP - 1 VIEW  Comparison: AP views same date.  Findings: Left hip replacement appears in satisfactory position without complication noted.  IMPRESSION: Left hip replacement appears in satisfactory position without complication noted.   Original Report Authenticated By: Genia Del, M.D.     EKG: Independently reviewed. Sinus, no ST changes  Time spent: 22min  Kaylanni Ezelle [facility] Pager [phone]  If 7PM-7AM, please contact night-coverage [email or web address] Password [facility] 03/10/2013, 2:36 PM

## 2013-03-11 ENCOUNTER — Encounter (HOSPITAL_COMMUNITY): Payer: Self-pay | Admitting: Orthopaedic Surgery

## 2013-03-11 DIAGNOSIS — J90 Pleural effusion, not elsewhere classified: Secondary | ICD-10-CM

## 2013-03-11 LAB — CBC
HCT: 27.3 % — ABNORMAL LOW (ref 36.0–46.0)
Hemoglobin: 9.3 g/dL — ABNORMAL LOW (ref 12.0–15.0)
MCH: 29.2 pg (ref 26.0–34.0)
MCHC: 34.1 g/dL (ref 30.0–36.0)
MCV: 85.8 fL (ref 78.0–100.0)
Platelets: 284 10*3/uL (ref 150–400)
RBC: 3.18 MIL/uL — ABNORMAL LOW (ref 3.87–5.11)
RDW: 13 % (ref 11.5–15.5)
WBC: 11.4 10*3/uL — ABNORMAL HIGH (ref 4.0–10.5)

## 2013-03-11 LAB — BASIC METABOLIC PANEL
BUN: 15 mg/dL (ref 6–23)
CO2: 28 mEq/L (ref 19–32)
Calcium: 8.8 mg/dL (ref 8.4–10.5)
Chloride: 91 mEq/L — ABNORMAL LOW (ref 96–112)
Creatinine, Ser: 0.9 mg/dL (ref 0.50–1.10)
GFR calc Af Amer: 70 mL/min — ABNORMAL LOW (ref >90)
GFR calc non Af Amer: 60 mL/min — ABNORMAL LOW (ref >90)
Glucose, Bld: 118 mg/dL — ABNORMAL HIGH (ref 70–99)
Potassium: 4 mEq/L (ref 3.5–5.1)
Sodium: 126 mEq/L — ABNORMAL LOW (ref 135–145)

## 2013-03-11 LAB — SODIUM, URINE, RANDOM: Sodium, Ur: 28 mEq/L

## 2013-03-11 LAB — CREATININE, URINE, RANDOM: Creatinine, Urine: 82.2 mg/dL

## 2013-03-11 MED ORDER — LEVOFLOXACIN 500 MG PO TABS: 500.0000 mg | ORAL_TABLET | Freq: Every day | ORAL | Status: DC

## 2013-03-11 MED ORDER — HYDROCODONE-ACETAMINOPHEN 5-325 MG PO TABS: 1.0000 | ORAL_TABLET | ORAL | Status: DC | PRN

## 2013-03-11 MED ORDER — ONDANSETRON HCL 4 MG PO TABS: 4.0000 mg | ORAL_TABLET | Freq: Three times a day (TID) | ORAL | Status: DC | PRN

## 2013-03-11 NOTE — Progress Notes (Signed)
Subjective: 3 Days Post-Op Procedure(s) (LRB): LEFT TOTAL HIP ARTHROPLASTY ANTERIOR APPROACH (Left) Patient reports pain as mild.  Looks better overall today.  Less cough.  Doing well with therapy.  Would like to go home.  Objective: Vital signs in last 24 hours: Temp:  [97.7 F (36.5 C)-98.1 F (36.7 C)] 97.9 F (36.6 C) (03/24 0600) Pulse Rate:  [74-79] 79 (03/24 0600) Resp:  [18] 18 (03/24 0600) BP: (112-122)/(63-72) 122/72 mmHg (03/24 0600) SpO2:  [94 %-97 %] 94 % (03/24 0600)  Intake/Output from previous day: 03/23 0701 - 03/24 0700 In: 240 [P.O.:240] Out: -  Intake/Output this shift:     Recent Labs  03/09/13 0530 03/10/13 0522 03/11/13 0510  HGB 10.5* 9.6* 9.3*    Recent Labs  03/10/13 0522 03/11/13 0510  WBC 12.1* 11.4*  RBC 3.30* 3.18*  HCT 28.5* 27.3*  PLT 308 284    Recent Labs  03/10/13 1505 03/11/13 0510  NA 125* 126*  K 4.1 4.0  CL 90* 91*  CO2 28 28  BUN 16 15  CREATININE 0.84 0.90  GLUCOSE 148* 118*  CALCIUM 8.5 8.8   No results found for this basename: LABPT, INR,  in the last 72 hours  Sensation intact distally Intact pulses distally Dorsiflexion/Plantar flexion intact Incision: scant drainage No cellulitis present Compartment soft  Assessment/Plan: 3 Days Post-Op Procedure(s) (LRB): LEFT TOTAL HIP ARTHROPLASTY ANTERIOR APPROACH (Left) Discharge home with home health today. Will place on oral antibiotic due to pleural effusions.  Mcarthur Rossetti 03/11/2013, 7:38 AM

## 2013-03-11 NOTE — Progress Notes (Signed)
Discharge instruction given to pt, verbalized understanding. Left the unit in stable condition.

## 2013-03-11 NOTE — Progress Notes (Signed)
Physical Therapy Treatment Patient Details Name: Deborah Jordan MRN: [id number] DOB: 16-Apr-1933 Today's Date: 03/11/2013 Time: [id number] PT Time Calculation (min): 17 min  PT Assessment / Plan / Recommendation Comments on Treatment Session  Pt continues to do well and is ready for D/C today.     Follow Up Recommendations  Home health PT     Does the patient have the potential to tolerate intense rehabilitation     Barriers to Discharge        Equipment Recommendations  Rolling walker with 5" wheels    Recommendations for Other Services    Frequency 7X/week   Plan Discharge plan remains appropriate    Precautions / Restrictions Precautions Precautions: None Restrictions Weight Bearing Restrictions: No LLE Weight Bearing: Weight bearing as tolerated Other Position/Activity Restrictions: WBAT   Pertinent Vitals/Pain No pain    Mobility  Bed Mobility Bed Mobility: Supine to Sit Supine to Sit: 6: Modified independent (Device/Increase time) Transfers Transfers: Sit to Stand;Stand to Sit;Stand Pivot Transfers Sit to Stand: 6: Modified independent (Device/Increase time) Stand to Sit: 6: Modified independent (Device/Increase time) Ambulation/Gait Ambulation/Gait Assistance: 5: Supervision Ambulation Distance (Feet): 130 Feet Assistive device: Rolling walker Ambulation/Gait Assistance Details: Min cues for upright posture.  Gait Pattern: Step-to pattern;Decreased stride length;Trunk flexed;Antalgic Gait velocity: decreased Stairs: Yes Stairs Assistance: 4: Min guard Stair Management Technique: No rails;Step to pattern;Backwards;Forwards;With walker Number of Stairs: 2    Exercises     PT Diagnosis:    PT Problem List:   PT Treatment Interventions:     PT Goals Acute Rehab PT Goals PT Goal Formulation: With patient Time For Goal Achievement: 03/13/13 Potential to Achieve Goals: Good Pt will go Supine/Side to Sit: with supervision PT Goal: Supine/Side to Sit -  Progress: Met Pt will go Sit to Stand: with supervision PT Goal: Sit to Stand - Progress: Met Pt will go Stand to Sit: with supervision PT Goal: Stand to Sit - Progress: Met Pt will Ambulate: 51 - 150 feet;with supervision;with least restrictive assistive device PT Goal: Ambulate - Progress: Met Pt will Go Up / Down Stairs: 1-2 stairs;with min assist;with least restrictive assistive device PT Goal: Up/Down Stairs - Progress: Met  Visit Information  Last PT Received On: 03/11/13 Assistance Needed: +1    Subjective Data  Subjective: I'm going home today.  Patient Stated Goal: to return home.    Cognition  Cognition Overall Cognitive Status: Appears within functional limits for tasks assessed/performed Arousal/Alertness: Awake/alert Orientation Level: Appears intact for tasks assessed Behavior During Session: [facility] for tasks performed    Balance     End of Session PT - End of Session Activity Tolerance: Patient tolerated treatment well Patient left: in chair;with call bell/phone within reach;with family/visitor present Nurse Communication: Mobility status   GP     Denice Bors 03/11/2013, 9:49 AM

## 2013-03-11 NOTE — Progress Notes (Signed)
Occupational Therapy Treatment Patient Details Name: BESAN MCKEOUGH MRN: [id number] DOB: 04-06-1933 Today's Date: 03/11/2013 Time: [id number] OT Time Calculation (min): 12 min  OT Assessment / Plan / Recommendation    Follow Up Recommendations  No OT follow up       Equipment Recommendations  3 in 1 bedside comode          Plan Discharge plan remains appropriate    Precautions / Restrictions Restrictions Weight Bearing Restrictions: No LLE Weight Bearing: Weight bearing as tolerated       ADL  Tub/Shower Transfer: Performed;Min guard Tub/Shower Transfer Method: Ambulating;Other (comment) (walk in shower. reccomend pt uses sons walk in shower) Warden/ranger: Walk in shower ADL Comments: Educated pt on safety with ADL activty. Pt will have good family support at home. Pt not able to step over tub- reccomend pt uses son walk in shower initially. Pt agrees      OT Goals ADL Goals ADL Goal: Tub/Shower Transfer - Progress: Progressing toward goals  Visit Information  Last OT Received On: 03/11/13    Subjective Data  Subjective: I am going home today      Cognition  Cognition Overall Cognitive Status: Appears within functional limits for tasks assessed/performed Arousal/Alertness: Awake/alert Orientation Level: Appears intact for tasks assessed Behavior During Session: [facility] for tasks performed    Mobility  Bed Mobility Bed Mobility: Supine to Sit Supine to Sit: 5: Supervision;With rails Transfers Transfers: Sit to Stand;Stand to Sit Sit to Stand: 5: Supervision;With upper extremity assist;From bed Stand to Sit: 5: Supervision;With upper extremity assist;To chair/3-in-1          End of Session OT - End of Session Activity Tolerance: Patient tolerated treatment well Patient left: in chair;with call bell/phone within reach  [place], Thereasa Parkin 03/11/2013, 9:44 AM

## 2013-03-11 NOTE — Discharge Summary (Signed)
Patient ID: [name] MRN: [id number] DOB/AGE: February 19, 1933 79 y.o.  Admit date: 03/08/2013 Discharge date: 03/11/2013  Admission Diagnoses:  Principal Problem:   Degenerative arthritis of hip Active Problems:   Pleural effusion   Hyponatremia   Hypertension   Discharge Diagnoses:  Same  Past Medical History  Diagnosis Date  . Hypertension   . GERD (gastroesophageal reflux disease)     controls with Nexium  . Arthritis     osteoarthritis. spinal stenosis. Scoliosis of spine-degenerative spine.    Surgeries: Procedure(s): LEFT TOTAL HIP ARTHROPLASTY ANTERIOR APPROACH on 03/08/2013   Consultants:    Discharged Condition: Improved  Hospital Course: Deborah Jordan is an 79 y.o. female who was admitted 03/08/2013 for operative treatment ofDegenerative arthritis of hip. Patient has severe unremitting pain that affects sleep, daily activities, and work/hobbies. After pre-op clearance the patient was taken to the operating room on 03/08/2013 and underwent  Procedure(s): LEFT TOTAL HIP ARTHROPLASTY ANTERIOR APPROACH.    Patient was given perioperative antibiotics: Anti-infectives   Start     Dose/Rate Route Frequency Ordered Stop   03/11/13 0000  levofloxacin (LEVAQUIN) 500 MG tablet     500 mg Oral Daily 03/11/13 0744     03/10/13 0000  azithromycin (ZITHROMAX Z-PAK) 250 MG tablet        03/10/13 0944     03/08/13 1800  ceFAZolin (ANCEF) IVPB 1 g/50 mL premix     1 g 100 mL/hr over 30 Minutes Intravenous Every 6 hours 03/08/13 1630 03/09/13 0311   03/08/13 0915  ceFAZolin (ANCEF) IVPB 2 g/50 mL premix     2 g 100 mL/hr over 30 Minutes Intravenous On call to O.R. 03/08/13 0915 03/08/13 1225       Patient was given sequential compression devices, early ambulation, and chemoprophylaxis to prevent DVT.  Patient benefited maximally from hospital stay and there were no complications.  She did develop bilateral small pleural effusions that are being treated with incentive  spirometry and levoquin.  Recent vital signs: Patient Vitals for the past 24 hrs:  BP Temp Temp src Pulse Resp SpO2  03/11/13 0600 122/72 mmHg 97.9 F (36.6 C) Oral 79 18 94 %  03/10/13 2200 118/64 mmHg 97.7 F (36.5 C) Oral 78 18 97 %  03/10/13 1438 112/63 mmHg 98.1 F (36.7 C) Oral 74 18 96 %  03/10/13 0759 - - - - 18 96 %     Recent laboratory studies:  Recent Labs  03/10/13 0522 03/10/13 1505 03/11/13 0510  WBC 12.1*  --  11.4*  HGB 9.6*  --  9.3*  HCT 28.5*  --  27.3*  PLT 308  --  284  NA  --  125* 126*  K  --  4.1 4.0  CL  --  90* 91*  CO2  --  28 28  BUN  --  16 15  CREATININE  --  0.84 0.90  GLUCOSE  --  148* 118*  CALCIUM  --  8.5 8.8     Discharge Medications:     Medication List    TAKE these medications       aspirin 325 MG EC tablet  Take 1 tablet (325 mg total) by mouth 2 (two) times daily.     azithromycin 250 MG tablet  Commonly known as:  ZITHROMAX Z-PAK  Take 2 tab po day#1 and then one tablet po each day thereafter.     esomeprazole 40 MG capsule  Commonly known as:  NEXIUM  Take 40 mg by mouth as needed (indigestion).     ferrous sulfate 325 (65 FE) MG tablet  Take 1 tablet (325 mg total) by mouth 3 (three) times daily after meals.     HYDROcodone-acetaminophen 5-325 MG per tablet  Commonly known as:  NORCO  Take 1-2 tablets by mouth every 4 (four) hours as needed for pain.     levofloxacin 500 MG tablet  Commonly known as:  LEVAQUIN  Take 1 tablet (500 mg total) by mouth daily.     methocarbamol 500 MG tablet  Commonly known as:  ROBAXIN  Take 1 tablet (500 mg total) by mouth 3 (three) times daily.     naproxen sodium 220 MG tablet  Commonly known as:  ANAPROX  Take 220 mg by mouth 2 (two) times daily with a meal.     ondansetron 4 MG tablet  Commonly known as:  ZOFRAN  Take 1 tablet (4 mg total) by mouth every 8 (eight) hours as needed for nausea.     valsartan 320 MG tablet  Commonly known as:  DIOVAN  Take 320 mg by  mouth daily before breakfast.        Diagnostic Studies: Dg Chest 2 View  03/10/2013  *RADIOLOGY REPORT*  Clinical Data: Cough and congestion  CHEST - 2 VIEW  Comparison: None.  Findings: Normal mediastinum and cardiac silhouette.  There is trace pleural effusions seen laterally.  No effusion, infiltrate, or pneumothorax. Degenerative osteophytosis of the thoracic spine.  IMPRESSION: Small effusions.  No pulmonary edema or infiltrate.   Original Report Authenticated By: Suzy Bouchard, M.D.    Dg Chest 2 View  03/06/2013  *RADIOLOGY REPORT*  Clinical Data: Preoperative respiratory films.  Patient for hip replacement.  CHEST - 2 VIEW  Comparison: PA and lateral chest 08/26/2009.  Findings: Lungs are clear.  Mild cardiomegaly is unchanged.  No pneumothorax or pleural effusion.  Multilevel thoracic degenerative disease is noted.  IMPRESSION: No acute disease.   Original Report Authenticated By: Orlean Patten, M.D.    Dg Hip Complete Left  03/08/2013  *RADIOLOGY REPORT*  Clinical Data: Left hip replacement.  LEFT HIP - COMPLETE 2+ VIEW  Fluoroscopic time:  18 seconds.  Comparison: None.  Findings: 2 intraoperative views of the left hip submitted for review after surgery.  This reveals total left hip replacement which appears in satisfactory position on this single projection without complication noted.  Of note is a radiopaque structure overlying the left ischium.  This may be outside the patient.  IMPRESSION: Total left hip replacement which appears in satisfactory position on this single projection without complication noted.  Of note is a radiopaque structure overlying the left ischium.  This may be outside the patient   Original Report Authenticated By: Genia Del, M.D.    Dg Pelvis Portable  03/08/2013  *RADIOLOGY REPORT*  Clinical Data: 79 year old female status post left hip surgery.  PORTABLE PELVIS  Comparison: Intraoperative images 1209 hours the same day.  Findings: Portable AP view at  1437 hours.  Sequelae of bipolar left hip arthroplasty.  Hardware appears intact and alignment within normal limits on this single view.  No unexpected osseous changes. Postoperative changes to the surrounding soft tissues.  IMPRESSION: Left hip arthroplasty with no adverse features identified.   Original Report Authenticated By: Roselyn Reef, M.D.    Dg Hip Portable 1 View Left  03/08/2013  *RADIOLOGY REPORT*  Clinical Data: Left hip replacement.  PORTABLE LEFT HIP - 1 VIEW  Comparison:  AP views same date.  Findings: Left hip replacement appears in satisfactory position without complication noted.  IMPRESSION: Left hip replacement appears in satisfactory position without complication noted.   Original Report Authenticated By: Genia Del, M.D.    Dg C-arm 1-60 Min-no Report  03/08/2013  CLINICAL DATA: LEFT TOTAL HIP   C-ARM 1-60 MINUTES  Fluoroscopy was utilized by the requesting physician.  No radiographic  interpretation.      Disposition:  To home      Discharge Orders   Future Orders Complete By Expires     Call MD / Call 911  As directed     Comments:      If you experience chest pain or shortness of breath, CALL 911 and be transported to the hospital emergency room.  If you develope a fever above 101 F, pus (white drainage) or increased drainage or redness at the wound, or calf pain, call your surgeon's office.    Call MD / Call 911  As directed     Comments:      If you experience chest pain or shortness of breath, CALL 911 and be transported to the hospital emergency room.  If you develope a fever above 101 F, pus (white drainage) or increased drainage or redness at the wound, or calf pain, call your surgeon's office.    Change dressing  As directed     Comments:      You may change your dressing on 3/24, then change the dressing daily with sterile 4 x 4 inch gauze dressing and paper tape.  You may clean the incision with alcohol prior to redressing    Constipation Prevention  As  directed     Comments:      Drink plenty of fluids.  Prune juice may be helpful.  You may use a stool softener, such as Colace (over the counter) 100 mg twice a day.  Use MiraLax (over the counter) for constipation as needed.    Constipation Prevention  As directed     Comments:      Drink plenty of fluids.  Prune juice may be helpful.  You may use a stool softener, such as Colace (over the counter) 100 mg twice a day.  Use MiraLax (over the counter) for constipation as needed.    Diet - low sodium heart healthy  As directed     Diet - low sodium heart healthy  As directed     Discharge instructions  As directed     Comments:      Keep hip incision dry for 5 days post op then may wet while bathing. Therapy daily . Call if fever or chills or increased drainage. Go to ER if acutely short of breath or call for ambulance. Return for follow up in 2 weeks. May full weight bear on the surgical leg unless told otherwise. In house walking for first 2 weeks.    Discharge instructions  As directed     Comments:      Increase activity as comfort allows. You can get your incision wet starting 03/13/13; then new dry dressing daily    Discharge patient  As directed     Driving restrictions  As directed     Comments:      No driving for 6 weeks    Follow the hip precautions as taught in Physical Therapy  As directed     Increase activity slowly as tolerated  As directed     Increase  activity slowly as tolerated  As directed     Lifting restrictions  As directed     Comments:      No lifting for 6 weeks       Follow-up Information   Follow up with Mcarthur Rossetti, MD In 2 weeks.   Contact information:   [place] [address] [phone]        Signed: Mcarthur Rossetti 03/11/2013, 7:44 AM

## 2013-03-15 LAB — TYPE AND SCREEN
ABO/RH(D): O NEG
Antibody Screen: NEGATIVE

## 2014-04-04 ENCOUNTER — Other Ambulatory Visit: Payer: Self-pay

## 2014-04-04 DIAGNOSIS — Z1231 Encounter for screening mammogram for malignant neoplasm of breast: Secondary | ICD-10-CM

## 2014-04-25 ENCOUNTER — Encounter (INDEPENDENT_AMBULATORY_CARE_PROVIDER_SITE_OTHER): Payer: Self-pay

## 2014-04-25 ENCOUNTER — Ambulatory Visit
Admission: RE | Admit: 2014-04-25 | Discharge: 2014-04-25 | Disposition: A | Discharge status: Home / Self Care | Payer: Medicare Other | Source: Ambulatory Visit

## 2014-04-25 DIAGNOSIS — Z1231 Encounter for screening mammogram for malignant neoplasm of breast: Secondary | ICD-10-CM

## 2016-05-24 ENCOUNTER — Ambulatory Visit
Admission: RE | Admit: 2016-05-24 | Discharge: 2016-05-24 | Disposition: A | Discharge status: Home / Self Care | Payer: Medicare Other | Source: Ambulatory Visit | Attending: Family Medicine | Admitting: Family Medicine

## 2016-05-24 ENCOUNTER — Other Ambulatory Visit: Payer: Self-pay | Admitting: Family Medicine

## 2016-05-24 DIAGNOSIS — S20212A Contusion of left front wall of thorax, initial encounter: Secondary | ICD-10-CM

## 2016-05-24 DIAGNOSIS — W19XXXA Unspecified fall, initial encounter: Secondary | ICD-10-CM

## 2017-01-30 ENCOUNTER — Ambulatory Visit (INDEPENDENT_AMBULATORY_CARE_PROVIDER_SITE_OTHER): Payer: Medicare Other

## 2017-01-30 ENCOUNTER — Ambulatory Visit (INDEPENDENT_AMBULATORY_CARE_PROVIDER_SITE_OTHER): Payer: Medicare Other | Admitting: Orthopaedic Surgery

## 2017-01-30 DIAGNOSIS — G8929 Other chronic pain: Secondary | ICD-10-CM

## 2017-01-30 DIAGNOSIS — M5442 Lumbago with sciatica, left side: Secondary | ICD-10-CM | POA: Diagnosis not present

## 2017-01-30 DIAGNOSIS — M25561 Pain in right knee: Secondary | ICD-10-CM

## 2017-01-30 DIAGNOSIS — M25562 Pain in left knee: Secondary | ICD-10-CM

## 2017-01-30 MED ORDER — LIDOCAINE HCL 1 % IJ SOLN
3.0000 mL | INTRAMUSCULAR | Status: AC | PRN
  Administered 2017-01-30: 3 mL

## 2017-01-30 MED ORDER — METHYLPREDNISOLONE ACETATE 40 MG/ML IJ SUSP
40.0000 mg | INTRAMUSCULAR | Status: AC | PRN
  Administered 2017-01-30: 40 mg via INTRA_ARTICULAR

## 2017-01-30 NOTE — Addendum Note (Signed)
Addended by: Meyer Cory on: 01/30/2017 05:25 PM   Modules accepted: Orders

## 2017-01-30 NOTE — Progress Notes (Signed)
Office Visit Note   Patient: Deborah Jordan           Date of Birth: 1933/11/23           MRN: [id number] Visit Date: 01/30/2017              Requested by: Deborah Jordan, MD [address] [place], [place] [address] PCP: Lilian Coma, MD   Assessment & Plan: Visit Diagnoses:  1. Chronic left-sided low back pain with left-sided sciatica   2. Chronic pain of left knee   3. Chronic pain of right knee     Plan: She tolerated the steroid injections in each knee. She would like to be seen by Dr. Ernestina Patches to consider bilateral L4-L5 injection since she is done very well with injections by him in the past has been a long period of time since those injections. I'll see her back myself in a month to see how she doing overall and in the interim she'll Deborah Jordan had her for an intervention by Dr. Ernestina Patches.  Follow-Up Instructions: Return in about 4 weeks (around 02/27/2017).   Orders:  Orders Placed This Encounter  Procedures  . Large Joint Injection/Arthrocentesis  . Large Joint Injection/Arthrocentesis  . XR Lumbar Spine 2-3 Views  . XR Knee 1-2 Views Left  . XR Knee 1-2 Views Right   No orders of the defined types were placed in this encounter.     Procedures: Large Joint Inj Date/Time: 01/30/2017 4:24 PM Performed by: Mcarthur Rossetti Authorized by: Jean Rosenthal Y   Location:  Knee Site:  R knee Ultrasound Guidance: No   Fluoroscopic Guidance: No   Arthrogram: No   Medications:  3 mL lidocaine 1 %; 40 mg methylPREDNISolone acetate 40 MG/ML Large Joint Inj Date/Time: 01/30/2017 4:24 PM Performed by: Mcarthur Rossetti Authorized by: Mcarthur Rossetti   Location:  Knee Site:  L knee Ultrasound Guidance: No   Fluoroscopic Guidance: No   Arthrogram: No   Medications:  3 mL lidocaine 1 %; 40 mg methylPREDNISolone acetate 40 MG/ML     Clinical Data: No additional findings.   Subjective: No chief complaint on file. The patient  is well-known to me but I haven't seen her long period of time. She comes in with bilateral knee pain as well as low back pain with radicular symptoms. She has known lumbar spinal stenosis and has had injections by Dr. Ernestina Patches years ago which helped greatly. She was told of the time she would probably have a series of injections but she only really needed 1 to help her feel better it's only been recent or she's been having low back pain with radicular symptoms into her backside more on the right than the left but definitely both sides per she also has bilateral knee pain and she won't have this evaluated today. She points the medial joint line as source of her pain of her knees.  HPI  Review of Systems Denies any recent illnesses and also denies any change in bowel or bladder function. She denies any fever chills nausea and vomiting. She denies any chest pain or shortness of breath.  Objective: Vital Signs: There were no vitals taken for this visit.  Physical Exam She is alert and oriented 3 and in no acute distress Ortho Exam Examination of both her knees show just a mild varus deformity of both knees. She does have patellofemoral crepitation and medial joint line tenderness of both knees. Both knees are ligamentously  stable. She has pain with flexion-extension of the lumbar spine and pain in the paraspinal muscles in the facet joint areas of the lowest aspects of lumbar spine. Specialty Comments:  No specialty comments available.  Imaging: Xr Knee 1-2 Views Left  Result Date: 01/30/2017 2 views of her left knee show tricompartmental arthritic changes mainly involving the patellofemoral joint and the medial joint line.  Xr Knee 1-2 Views Right  Result Date: 01/30/2017 Views of the right knee show end-stage arthritis with complete loss of the medial joint space and significant patellofemoral arthritic changes. There is a mild varus malalignment.  Xr Lumbar Spine 2-3 Views  Result Date:  01/30/2017 An AP and lateral lumbar spine show significant degenerative scoliosis with multifactorial degenerative changes well involving the posterior elements of the spine and the disc spaces.    PMFS History: Patient Active Problem List   Diagnosis Date Noted  . Pleural effusion 03/10/2013  . Hyponatremia 03/10/2013  . Hypertension 03/10/2013  . Degenerative arthritis of hip 03/08/2013   Past Medical History:  Diagnosis Date  . Arthritis    osteoarthritis. spinal stenosis. Scoliosis of spine-degenerative spine.  Marland Kitchen GERD (gastroesophageal reflux disease)    controls with Nexium  . Hypertension     No family history on file.  Past Surgical History:  Procedure Laterality Date  . CATARACT EXTRACTION  03-06-13   left eye  . CHOLECYSTECTOMY    . DILATION AND CURETTAGE OF UTERUS    . TOTAL HIP ARTHROPLASTY Left 03/08/2013   Procedure: LEFT TOTAL HIP ARTHROPLASTY ANTERIOR APPROACH;  Surgeon: Mcarthur Rossetti, MD;  Location: [facility];  Service: Orthopedics;  Laterality: Left;   Social History   Occupational History  . Not on file.   Social History Main Topics  . Smoking status: Never Smoker  . Smokeless tobacco: Not on file  . Alcohol use No  . Drug use: No  . Sexual activity: Not Currently

## 2017-02-06 ENCOUNTER — Encounter (INDEPENDENT_AMBULATORY_CARE_PROVIDER_SITE_OTHER): Payer: Medicare Other | Admitting: Physical Medicine and Rehabilitation

## 2017-02-27 ENCOUNTER — Ambulatory Visit (INDEPENDENT_AMBULATORY_CARE_PROVIDER_SITE_OTHER): Payer: Medicare Other | Admitting: Orthopaedic Surgery

## 2017-02-27 ENCOUNTER — Encounter (INDEPENDENT_AMBULATORY_CARE_PROVIDER_SITE_OTHER): Payer: Medicare Other | Admitting: Physical Medicine and Rehabilitation

## 2017-03-06 ENCOUNTER — Telehealth (INDEPENDENT_AMBULATORY_CARE_PROVIDER_SITE_OTHER): Payer: Self-pay | Admitting: *Deleted

## 2017-03-06 NOTE — Telephone Encounter (Signed)
Pt called stating she has an appt with Dr. Ernestina Patches 3/26 at 1:30 for an injection. Pt also has an appt 3/26 at 2:15, pt asking if she needed to reschedule with blackman, she had an inj. Last appt in her knees. Pt concerned about having both appts in one day. She is ok with both, just stated she didn't want to waste an appt

## 2017-03-07 NOTE — Telephone Encounter (Signed)
Patient states she is just going to keep her appt

## 2017-03-13 ENCOUNTER — Ambulatory Visit (INDEPENDENT_AMBULATORY_CARE_PROVIDER_SITE_OTHER): Payer: Self-pay

## 2017-03-13 ENCOUNTER — Encounter (INDEPENDENT_AMBULATORY_CARE_PROVIDER_SITE_OTHER): Payer: Self-pay | Admitting: Physical Medicine and Rehabilitation

## 2017-03-13 ENCOUNTER — Ambulatory Visit (INDEPENDENT_AMBULATORY_CARE_PROVIDER_SITE_OTHER): Payer: Medicare Other | Admitting: Orthopaedic Surgery

## 2017-03-13 ENCOUNTER — Ambulatory Visit (INDEPENDENT_AMBULATORY_CARE_PROVIDER_SITE_OTHER): Payer: Medicare Other | Admitting: Physical Medicine and Rehabilitation

## 2017-03-13 VITALS — BP 152/79 | HR 79 | Temp 97.9°F

## 2017-03-13 DIAGNOSIS — M5416 Radiculopathy, lumbar region: Secondary | ICD-10-CM

## 2017-03-13 DIAGNOSIS — M25561 Pain in right knee: Secondary | ICD-10-CM

## 2017-03-13 DIAGNOSIS — M25562 Pain in left knee: Secondary | ICD-10-CM

## 2017-03-13 DIAGNOSIS — G8929 Other chronic pain: Secondary | ICD-10-CM

## 2017-03-13 MED ORDER — METHYLPREDNISOLONE ACETATE 80 MG/ML IJ SUSP
80.0000 mg | Freq: Once | INTRAMUSCULAR | Status: AC
  Administered 2017-03-13: 80 mg

## 2017-03-13 MED ORDER — LIDOCAINE HCL (PF) 1 % IJ SOLN
0.3300 mL | Freq: Once | INTRAMUSCULAR | Status: AC
  Administered 2017-03-13: 0.3 mL

## 2017-03-13 NOTE — Progress Notes (Signed)
The patient is here for follow-up for bilateral chronic knee pain and arthritis. We were able to place a steroid injection in both knees in February said the knees are doing great since then if a lot better overall. She has a history of the left total hip are flaccid and replaced 4 years ago. She gets some pain over her IT band on the left side but otherwise is doing well. She actually had epidural steroid injection by Dr. Ernestina Patches today in her back and said that is doing well as is well.  On examination of her knees neither knee has effusion. Both knees have stable in full range of motion without any significant problems at all.  At this point I did show her stretching exercises for her IT band syndrome or trochanteric bursitis. She'll follow-up as needed. She's having problems or needs injections at anytime she knows to give [place] a call.

## 2017-03-13 NOTE — Progress Notes (Signed)
Deborah Jordan - 83 y.o. female MRN [id number]  Date of birth: May 30, 1933  Office Visit Note: Visit Date: 03/13/2017 PCP: Lilian Coma, MD Referred by: [name], MD  Subjective: Chief Complaint  Patient presents with  . Lower Back - Pain   HPI: Deborah Jordan is an 83 year old female that I have seen in the remote past for transforaminal epidural steroid injections at L4-5 for severe multifactorial stenosis. She has moderate stenosis at L3-4. This was from an MRI in 2011. She's been followed by Dr. Ninfa Linden more recently with total hip replacement. She does have an appointment in today on the relief. We did reschedule her appointment and she is here today for potential injection for her low back and radicular pain which is bilateral. Lower back pain for several months. She is getting pain down into the hips. Constant pain with movement. Relief sitting and laying.     ROS Otherwise per HPI.  Assessment & Plan: Visit Diagnoses:  1. Lumbar radiculopathy     Plan: Findings:  Bilateral L4 transforaminal epidural steroid injection. If she doesn't get good relief and I would look at repeating her lumbar spine MRI since his been since 2011.    Meds & Orders:  Meds ordered this encounter  Medications  . lidocaine (PF) (XYLOCAINE) 1 % injection 0.3 mL  . methylPREDNISolone acetate (DEPO-MEDROL) injection 80 mg    Orders Placed This Encounter  Procedures  . XR C-ARM NO REPORT  . Epidural Steroid injection    Follow-up: Return if symptoms worsen or fail to improve.   Procedures: No procedures performed  Lumbosacral Transforaminal Epidural Steroid Injection - Infraneural Approach with Fluoroscopic Guidance  Patient: Deborah Jordan      Date of Birth: 1933-03-03 MRN: [id number] PCP: Lilian Coma, MD      Visit Date: 03/13/2017   Universal Protocol:    Date/Time: 03/26/181:54 PM  Consent Given By: the patient  Position: PRONE   Additional Comments: Vital signs were  monitored before and after the procedure. Patient was prepped and draped in the usual sterile fashion. The correct patient, procedure, and site was verified.   Injection Procedure Details:  Procedure Site One Meds Administered:  Meds ordered this encounter  Medications  . lidocaine (PF) (XYLOCAINE) 1 % injection 0.3 mL  . methylPREDNISolone acetate (DEPO-MEDROL) injection 80 mg      Laterality: Bilateral  Location/Site:  L4-L5  Needle size: 22 G  Needle type: Spinal  Needle Placement: Transforaminal  Findings:  -Contrast Used: 2 mL iohexol 180 mg iodine/mL   -Comments: Excellent flow of contrast along the nerve and into the epidural space.  Procedure Details: After squaring off the end-plates of the desired vertebral level to get a true AP view, the C-arm was obliqued to the painful side so that the superior articulating process is positioned about 1/3 the length of the inferior endplate.  The needle was aimed toward the junction of the superior articular process and the transverse process of the inferior vertebrae. The needle's initial entry is in the lower third of the foramen through Kambin's triangle. The soft tissues overlying this target were infiltrated with 2-3 ml. of 1% Lidocaine without Epinephrine.  The spinal needle was then inserted and advanced toward the target using a "trajectory" view along the fluoroscope beam.  Under AP and lateral visualization, the needle was advanced so it did not puncture dura and did not traverse medially beyond the 6 o'clock position of the pedicle. Bi-planar projections were used to  confirm position. Aspiration was confirmed to be negative for CSF and/or blood. A 1-2 ml. volume of Isovue-250 was injected and flow of contrast was noted at each level. Radiographs were obtained for documentation purposes.   After attaining the desired flow of contrast documented above, a 0.5 to 1.0 ml test dose of 0.25% Marcaine was injected into each  respective transforaminal space.  The patient was observed for 90 seconds post injection.  After no sensory deficits were reported, and normal lower extremity motor function was noted,   the above injectate was administered so that equal amounts of the injectate were placed at each foramen (level) into the transforaminal epidural space.   Additional Comments:  The patient tolerated the procedure well Dressing: Band-Aid    Post-procedure details: Patient was observed during the procedure. Post-procedure instructions were reviewed.  Patient left the clinic in stable condition.   Clinical History: No specialty comments available.  She reports that she has never smoked. She has never used smokeless tobacco. No results for input(s): HGBA1C, LABURIC in the last 8760 hours.  Objective:  VS:  HT:    WT:   BMI:     BP:(!) 152/79  HR:79bpm  TEMP:97.9 F (36.6 C)( )  RESP:98 % Physical Exam  Musculoskeletal:  Patient ambulates without aid with good distal strength.    Ortho Exam Imaging: No results found.  Past Medical/Family/Surgical/Social History: Medications & Allergies reviewed per EMR Patient Active Problem List   Diagnosis Date Noted  . Pleural effusion 03/10/2013  . Hyponatremia 03/10/2013  . Hypertension 03/10/2013  . Degenerative arthritis of hip 03/08/2013   Past Medical History:  Diagnosis Date  . Arthritis    osteoarthritis. spinal stenosis. Scoliosis of spine-degenerative spine.  Marland Kitchen GERD (gastroesophageal reflux disease)    controls with Nexium  . Hypertension    History reviewed. No pertinent family history. Past Surgical History:  Procedure Laterality Date  . CATARACT EXTRACTION  03-06-13   left eye  . CHOLECYSTECTOMY    . DILATION AND CURETTAGE OF UTERUS    . TOTAL HIP ARTHROPLASTY Left 03/08/2013   Procedure: LEFT TOTAL HIP ARTHROPLASTY ANTERIOR APPROACH;  Surgeon: Mcarthur Rossetti, MD;  Location: [facility];  Service: Orthopedics;  Laterality: Left;     Social History   Occupational History  . Not on file.   Social History Main Topics  . Smoking status: Never Smoker  . Smokeless tobacco: Never Used  . Alcohol use No  . Drug use: No  . Sexual activity: Not Currently

## 2017-03-13 NOTE — Procedures (Signed)
Lumbosacral Transforaminal Epidural Steroid Injection - Infraneural Approach with Fluoroscopic Guidance  Patient: Deborah Jordan      Date of Birth: 11-12-1933 MRN: [id number] PCP: Lilian Coma, MD      Visit Date: 03/13/2017   Universal Protocol:    Date/Time: 03/26/181:54 PM  Consent Given By: the patient  Position: PRONE   Additional Comments: Vital signs were monitored before and after the procedure. Patient was prepped and draped in the usual sterile fashion. The correct patient, procedure, and site was verified.   Injection Procedure Details:  Procedure Site One Meds Administered:  Meds ordered this encounter  Medications  . lidocaine (PF) (XYLOCAINE) 1 % injection 0.3 mL  . methylPREDNISolone acetate (DEPO-MEDROL) injection 80 mg      Laterality: Bilateral  Location/Site:  L4-L5  Needle size: 22 G  Needle type: Spinal  Needle Placement: Transforaminal  Findings:  -Contrast Used: 2 mL iohexol 180 mg iodine/mL   -Comments: Excellent flow of contrast along the nerve and into the epidural space.  Procedure Details: After squaring off the end-plates of the desired vertebral level to get a true AP view, the C-arm was obliqued to the painful side so that the superior articulating process is positioned about 1/3 the length of the inferior endplate.  The needle was aimed toward the junction of the superior articular process and the transverse process of the inferior vertebrae. The needle's initial entry is in the lower third of the foramen through Kambin's triangle. The soft tissues overlying this target were infiltrated with 2-3 ml. of 1% Lidocaine without Epinephrine.  The spinal needle was then inserted and advanced toward the target using a "trajectory" view along the fluoroscope beam.  Under AP and lateral visualization, the needle was advanced so it did not puncture dura and did not traverse medially beyond the 6 o'clock position of the pedicle. Bi-planar  projections were used to confirm position. Aspiration was confirmed to be negative for CSF and/or blood. A 1-2 ml. volume of Isovue-250 was injected and flow of contrast was noted at each level. Radiographs were obtained for documentation purposes.   After attaining the desired flow of contrast documented above, a 0.5 to 1.0 ml test dose of 0.25% Marcaine was injected into each respective transforaminal space.  The patient was observed for 90 seconds post injection.  After no sensory deficits were reported, and normal lower extremity motor function was noted,   the above injectate was administered so that equal amounts of the injectate were placed at each foramen (level) into the transforaminal epidural space.   Additional Comments:  The patient tolerated the procedure well Dressing: Band-Aid    Post-procedure details: Patient was observed during the procedure. Post-procedure instructions were reviewed.  Patient left the clinic in stable condition.

## 2017-03-13 NOTE — Patient Instructions (Signed)

## 2017-04-24 ENCOUNTER — Other Ambulatory Visit: Payer: Self-pay | Admitting: Family Medicine

## 2017-04-24 ENCOUNTER — Ambulatory Visit
Admission: RE | Admit: 2017-04-24 | Discharge: 2017-04-24 | Disposition: A | Discharge status: Home / Self Care | Payer: Medicare Other | Source: Ambulatory Visit | Attending: Family Medicine | Admitting: Family Medicine

## 2017-04-24 DIAGNOSIS — R0602 Shortness of breath: Secondary | ICD-10-CM

## 2017-04-25 ENCOUNTER — Other Ambulatory Visit: Payer: Self-pay | Admitting: Family Medicine

## 2017-04-25 DIAGNOSIS — I4510 Unspecified right bundle-branch block: Secondary | ICD-10-CM

## 2017-05-04 ENCOUNTER — Other Ambulatory Visit: Payer: Self-pay

## 2017-05-04 ENCOUNTER — Ambulatory Visit (HOSPITAL_COMMUNITY): Payer: Medicare Other | Attending: Cardiovascular Disease

## 2017-05-04 DIAGNOSIS — I5189 Other ill-defined heart diseases: Secondary | ICD-10-CM

## 2017-05-04 DIAGNOSIS — I517 Cardiomegaly: Secondary | ICD-10-CM

## 2017-05-04 DIAGNOSIS — I4510 Unspecified right bundle-branch block: Secondary | ICD-10-CM | POA: Diagnosis not present

## 2017-05-04 HISTORY — DX: Other ill-defined heart diseases: I51.89

## 2017-05-04 HISTORY — DX: Cardiomegaly: I51.7

## 2017-05-22 ENCOUNTER — Other Ambulatory Visit: Payer: Self-pay | Admitting: Family Medicine

## 2017-05-22 ENCOUNTER — Ambulatory Visit
Admission: RE | Admit: 2017-05-22 | Discharge: 2017-05-22 | Disposition: A | Discharge status: Home / Self Care | Payer: Medicare Other | Source: Ambulatory Visit | Attending: Family Medicine | Admitting: Family Medicine

## 2017-05-22 DIAGNOSIS — S20211A Contusion of right front wall of thorax, initial encounter: Secondary | ICD-10-CM

## 2017-05-22 DIAGNOSIS — W19XXXA Unspecified fall, initial encounter: Secondary | ICD-10-CM

## 2017-09-27 ENCOUNTER — Ambulatory Visit (INDEPENDENT_AMBULATORY_CARE_PROVIDER_SITE_OTHER): Payer: Medicare Other

## 2017-09-27 ENCOUNTER — Ambulatory Visit (INDEPENDENT_AMBULATORY_CARE_PROVIDER_SITE_OTHER): Payer: Medicare Other | Admitting: Orthopaedic Surgery

## 2017-09-27 DIAGNOSIS — G8929 Other chronic pain: Secondary | ICD-10-CM

## 2017-09-27 DIAGNOSIS — M5136 Other intervertebral disc degeneration, lumbar region: Secondary | ICD-10-CM | POA: Diagnosis not present

## 2017-09-27 DIAGNOSIS — M5441 Lumbago with sciatica, right side: Secondary | ICD-10-CM | POA: Diagnosis not present

## 2017-09-27 DIAGNOSIS — M5442 Lumbago with sciatica, left side: Secondary | ICD-10-CM

## 2017-09-27 DIAGNOSIS — Z96642 Presence of left artificial hip joint: Secondary | ICD-10-CM | POA: Diagnosis not present

## 2017-09-27 NOTE — Progress Notes (Signed)
Deborah Jordan is well-known to me. She has a history  Her biggest complaint is low back pain more left than the right. The hip has not been an issue to her at all from her left total hip arthroplasty. She points the lumbar spine on the left than the right as source for pain. Actually had x-rays of her back earlier this year that showed severe degenerative scoliosis. Dr. Ernestina Patches provided translaminar injections at bilateral L4-L5 back in March. She's not sure that helped her much. She still denies any weakness in her legs any numbness and tingling in her feet.  On exam her left hip exam is entirely normal. She has good strength in both her feet and legs and normal sensation. She has a hard time getting up from a seated position and pain with flexion extension of her lumbar spine. She has moderate truncal obesity. We did go over her plain films again of her lumbar spine from back in February of this year to show her the extent of her scoliosis. It also shows severe degenerative changes at multiple levels of lumbar spine. X-rays today of her left hip show well-seated implant with no, getting features.  At this point we will obtain an MRI of her lumbar spine to assess where nerves her potentially getting compressed the worst as well as assessing the facet joints to determine what intervention may be the best for her. We'll see her back after the MRI is obtained.

## 2017-09-27 NOTE — Addendum Note (Signed)
Addended by: Meyer Cory on: 09/27/2017 04:08 PM   Modules accepted: Orders

## 2017-10-11 ENCOUNTER — Ambulatory Visit (INDEPENDENT_AMBULATORY_CARE_PROVIDER_SITE_OTHER): Payer: Medicare Other | Admitting: Orthopaedic Surgery

## 2017-10-18 ENCOUNTER — Ambulatory Visit
Admission: RE | Admit: 2017-10-18 | Discharge: 2017-10-18 | Disposition: A | Discharge status: Home / Self Care | Payer: Medicare Other | Source: Ambulatory Visit | Attending: Orthopaedic Surgery | Admitting: Orthopaedic Surgery

## 2017-10-18 DIAGNOSIS — M5441 Lumbago with sciatica, right side: Secondary | ICD-10-CM

## 2017-10-18 DIAGNOSIS — G8929 Other chronic pain: Secondary | ICD-10-CM

## 2017-10-18 DIAGNOSIS — M5442 Lumbago with sciatica, left side: Secondary | ICD-10-CM

## 2017-10-18 DIAGNOSIS — M5136 Other intervertebral disc degeneration, lumbar region: Secondary | ICD-10-CM

## 2017-10-23 ENCOUNTER — Encounter (INDEPENDENT_AMBULATORY_CARE_PROVIDER_SITE_OTHER): Payer: Self-pay | Admitting: Orthopaedic Surgery

## 2017-10-23 ENCOUNTER — Ambulatory Visit (INDEPENDENT_AMBULATORY_CARE_PROVIDER_SITE_OTHER): Payer: Medicare Other | Admitting: Orthopaedic Surgery

## 2017-10-23 DIAGNOSIS — M5442 Lumbago with sciatica, left side: Secondary | ICD-10-CM | POA: Diagnosis not present

## 2017-10-23 DIAGNOSIS — M5441 Lumbago with sciatica, right side: Secondary | ICD-10-CM | POA: Diagnosis not present

## 2017-10-23 DIAGNOSIS — G8929 Other chronic pain: Secondary | ICD-10-CM

## 2017-10-23 NOTE — Progress Notes (Signed)
The patient returns for follow-up after having an MRI of her lumbar spine.  She is a 83 year old performed a left total hip arthroplasty on the past.  She has known degenerative changes at multiple levels of the lumbar spine.  She actually had transforaminal injections at L4-L5 by Dr. Ernestina Patches back in March it was helpful somewhat.  She is having some chronic left and right-sided low back pain but is little bit higher and has been in the past.  Finally obtain an MRI because she was having some sciatic complaints.  She does feel like the left leg is weaker.  We performed a hip replacement 5 years ago.  She is now diabetic.  On exam I can easily put her right hip to range of motion no pain in the groin at all.  I do feel her right leg in general is slightly less strong than the left leg.  Her back pain seems to be more in the mid lumbar spine to the left side and I inspected the soft tissue and see no rashes or other evidence of infection.  She hurts in the paraspinal muscles as well.  MRI does show severe worsening stenosis at L4-L5 but also L2-L3.  At this point she is interested in a consultation with Dr. Ernestina Patches and like to set up a consultation for screws and not sure what would be the best area to inject for her.  She says the pain is more of an annoyance and fortunately not debilitating.  She is interested though in an appoint with Dr. Ernestina Patches.  We will work on making that happen.

## 2017-10-24 ENCOUNTER — Other Ambulatory Visit (INDEPENDENT_AMBULATORY_CARE_PROVIDER_SITE_OTHER): Payer: Self-pay

## 2017-10-24 DIAGNOSIS — G8929 Other chronic pain: Secondary | ICD-10-CM

## 2017-10-24 DIAGNOSIS — M5442 Lumbago with sciatica, left side: Principal | ICD-10-CM

## 2017-11-07 ENCOUNTER — Ambulatory Visit (INDEPENDENT_AMBULATORY_CARE_PROVIDER_SITE_OTHER): Payer: Medicare Other | Admitting: Physical Medicine and Rehabilitation

## 2017-11-07 ENCOUNTER — Encounter (INDEPENDENT_AMBULATORY_CARE_PROVIDER_SITE_OTHER): Payer: Self-pay | Admitting: Physical Medicine and Rehabilitation

## 2017-11-07 VITALS — BP 144/83 | HR 75

## 2017-11-07 DIAGNOSIS — G8929 Other chronic pain: Secondary | ICD-10-CM

## 2017-11-07 DIAGNOSIS — M47816 Spondylosis without myelopathy or radiculopathy, lumbar region: Secondary | ICD-10-CM | POA: Diagnosis not present

## 2017-11-07 DIAGNOSIS — M545 Low back pain, unspecified: Secondary | ICD-10-CM

## 2017-11-07 MED ORDER — DIAZEPAM 5 MG PO TABS: ORAL_TABLET | ORAL | 0 refills | Status: DC

## 2017-11-07 NOTE — Progress Notes (Deleted)
Lower back pain- left worse than right. Sometimes has leg pain, but no numbness or tingling. Legs get "tired" and swell with sitting for a long time.

## 2017-11-20 ENCOUNTER — Encounter (INDEPENDENT_AMBULATORY_CARE_PROVIDER_SITE_OTHER): Payer: Self-pay | Admitting: Physical Medicine and Rehabilitation

## 2017-11-20 ENCOUNTER — Ambulatory Visit (INDEPENDENT_AMBULATORY_CARE_PROVIDER_SITE_OTHER): Payer: Medicare Other | Admitting: Physical Medicine and Rehabilitation

## 2017-11-20 ENCOUNTER — Ambulatory Visit (INDEPENDENT_AMBULATORY_CARE_PROVIDER_SITE_OTHER): Payer: Medicare Other

## 2017-11-20 VITALS — BP 125/64 | HR 73 | Temp 98.2°F

## 2017-11-20 DIAGNOSIS — M47816 Spondylosis without myelopathy or radiculopathy, lumbar region: Secondary | ICD-10-CM

## 2017-11-20 DIAGNOSIS — G8929 Other chronic pain: Secondary | ICD-10-CM

## 2017-11-20 DIAGNOSIS — M545 Low back pain, unspecified: Secondary | ICD-10-CM

## 2017-11-20 MED ORDER — METHYLPREDNISOLONE ACETATE 80 MG/ML IJ SUSP
80.0000 mg | Freq: Once | INTRAMUSCULAR | Status: AC
  Administered 2017-11-20: 80 mg

## 2017-11-20 MED ORDER — LIDOCAINE HCL (PF) 1 % IJ SOLN
2.0000 mL | Freq: Once | INTRAMUSCULAR | Status: AC
  Administered 2017-11-20: 2 mL

## 2017-11-20 NOTE — Patient Instructions (Signed)

## 2017-11-20 NOTE — Progress Notes (Signed)
No changes in her sxs since we saw her last.  + Driver, - BT's, - Dye allergy

## 2017-11-21 ENCOUNTER — Encounter (INDEPENDENT_AMBULATORY_CARE_PROVIDER_SITE_OTHER): Payer: Self-pay | Admitting: Physical Medicine and Rehabilitation

## 2017-11-21 NOTE — Progress Notes (Signed)
Deborah Jordan - 83 y.o. female MRN [id number]  Date of birth: [birth year]  Office Visit Note: Visit Date: 11/07/2017 PCP: [name], MD Referred by: [name], MD  Subjective: Chief Complaint  Patient presents with  . Lower Back - Pain   HPI: Deborah Jordan is an 83 year old female that I have seen in the past and completed transforaminal injection at L4 for significant stenosis at that area.  She has a history of prior total left hip replacement.  She is followed by Dr. Ninfa Linden for orthopedic care.  He is recently updated her MRI of her lumbar spine due to worsening low back pain that she was having.  She reports to me today that she is having increasing low back pain left more than right but bilateral.  Sometimes she gets pain referred to the hip but really no numbness or tingling or paresthesias.  She feels like her legs do get "tired" at times she does get some swelling if she sits for a prolonged period which she has to do because her back pain will get progressive enough with standing that she has to find a place to sit.  The MRI is reviewed with her today and is reviewed below the documents.  It basically shows worsening facet arthropathy particularly L3-4 and L4-5.  There is moderate narrowing at L3-4 and severe narrowing at L4-5.  She has had no focal weakness or foot drop.  She has had no trauma.  She reports still being functional except having to sit at times due to the pain.  Medications have been ineffectual.  She has had physical therapy and continues to try to stay active.  She reports the worst problem is the back pain itself.  She has had no unintended weight loss or fevers chills or night sweats.    Review of Systems  Constitutional: Negative for chills, fever, malaise/fatigue and weight loss.  HENT: Negative for hearing loss and sinus pain.   Eyes: Negative for blurred vision, double vision and photophobia.  Respiratory: Negative for cough and shortness of breath.     Cardiovascular: Negative for chest pain, palpitations and leg swelling.  Gastrointestinal: Negative for abdominal pain, nausea and vomiting.  Genitourinary: Negative for flank pain.  Musculoskeletal: Positive for back pain and joint pain. Negative for myalgias.  Skin: Negative for itching and rash.  Neurological: Negative for tremors, focal weakness and weakness.  Endo/Heme/Allergies: Negative.   Psychiatric/Behavioral: Negative for depression.  All other systems reviewed and are negative.  Otherwise per HPI.  Assessment & Plan: Visit Diagnoses:  1. Spondylosis without myelopathy or radiculopathy, lumbar region   2. Chronic bilateral low back pain without sciatica     Plan: Findings:  Chronic history of low back pain which is significant for spondylosis and facet arthropathy as well as stenosis.  It is always hard to my patient has both conditions facet arthropathy leading to stenosis.  Clearly the patient can have facet mediated low back pain without much pain from the stenosis even though it is quite significant.  Not having radicular leg pain at this point.  She is not having true claudication has pain on extension with exam that does seem to reproduce her basis.  I think the appropriate step is diagnostic and hopefully therapeutic facet joint blocks.  She gets great relief but is not long lasting we would look at medial branch blocks and radiofrequency ablation.  If it does not help very much we would repeat steroid injection either at  L3 or L4.  She is a fairly active and healthy 83 year old female probably should consider potentially decompressive laminectomy at that level if it came down to this being more of a stenosis issue.  He really does not want to go down that road unless she had to.  Blocks.  She can continue current medications and exercises.    Meds & Orders:  Meds ordered this encounter  Medications  . diazepam (VALIUM) 5 MG tablet    Sig: Take 1 by mouth 1 to 2 hours  pre-procedure. May repeat if necessary.    Dispense:  2 tablet    Refill:  0   No orders of the defined types were placed in this encounter.   Follow-up: Return for bilateral L3-4 and L4-5 facet.   Procedures: No procedures performed  No notes on file   Clinical History: MRI LUMBAR SPINE WITHOUT CONTRAST  TECHNIQUE: Multiplanar, multisequence MR imaging of the lumbar spine was performed. No intravenous contrast was administered.  COMPARISON:  Lumbar spine radiograph 01/30/2017  Lumbar spine MRI 08/12/2010  FINDINGS: Segmentation:  Standard.  Alignment:  Right convex scoliosis with apex at L3.  Vertebrae:  Discogenic endplate signal changes at L1-L2 and L2-L3.  Conus medullaris: Extends to the L2 level. There is a mildly dilated central canal of the conus medullaris measuring 1.1 mm. This finding is unchanged.  Paraspinal and other soft tissues: Negative.  Disc levels:  T11-T12:  Mild disc bulge without stenosis.  T12-L1: Normal disc space and facets. No spinal canal or neuroforaminal stenosis.  L1-L2: Right eccentric disc bulge with small amount of fluid in the right facet joint. No spinal canal stenosis. Moderate right and mild left neural foraminal stenosis. This has worsened from the prior study.  L2-L3: There is a a right eccentric intermediate disc bulge. No central spinal canal stenosis. Moderate right and severe left neural foraminal stenosis, worsened from the prior study.  L3-L4: Intermediate diffuse disc bulge, left eccentric. Moderate spinal canal stenosis, unchanged. Unchanged severe left neural foraminal stenosis.  L4-L5: Large disc bulge with a component of central extrusion with inferior migration to the suprapedicle L5 level. Moderate bilateral facet hypertrophy. Severe spinal canal stenosis, slightly worsened. Bilateral neural foraminal stenosis is also worsened, severe on the right and moderate to severe on the  left.  L5-S1: Small disc bulge moderate facet hypertrophy, unchanged. No spinal canal stenosis. Mild right foraminal stenosis, unchanged.  Visualized sacrum: Normal.  IMPRESSION: 1. Worsening of L4-L5 degenerative disease with severe spinal canal stenosis and severe right, moderate to severe left neural foraminal stenosis. 2. Worsening degenerative disease at L1-L2 and L2-L3 with moderate right L1-2 foraminal stenosis, moderate right L2-3 foraminal stenosis and severe left L2-3 foraminal stenosis. 3. Unchanged moderate spinal canal stenosis and bilateral severe neural foraminal stenosis at L3-L4.   Electronically Signed   By: Ulyses Jarred M.D.   On: 10/19/2017 03:45  She reports that  has never smoked. she has never used smokeless tobacco. No results for input(s): HGBA1C, LABURIC in the last 8760 hours.  Objective:  VS:  HT:    WT:   BMI:     BP:(!) 144/83  HR:75bpm  TEMP: ( )  RESP:  Physical Exam  Constitutional: She is oriented to person, place, and time. She appears well-developed and well-nourished. No distress.  Obese  HENT:  Head: Normocephalic and atraumatic.  Nose: Nose normal.  Mouth/Throat: Oropharynx is clear and moist.  Eyes: Conjunctivae and EOM are normal. Pupils are equal, round, and  reactive to light.  Neck: Normal range of motion. Neck supple. No tracheal deviation present.  Cardiovascular: Regular rhythm and intact distal pulses.  Pulmonary/Chest: Effort normal. No respiratory distress.  Abdominal: She exhibits no distension. There is no guarding.  Musculoskeletal:  Patient arises slowly from a seated left more than right of the lumbar spine.  She has no pain with hip rotation.  She has no pain over the greater trochanters or PSIS.  She has good distal strength without clonus.  Neurological: She is alert and oriented to person, place, and time. She exhibits normal muscle tone. Coordination normal.  Skin: Skin is warm. No rash noted. No erythema.   Psychiatric: She has a normal mood and affect. Her behavior is normal.  Nursing note and vitals reviewed.   Ortho Exam Imaging: Xr C-arm No Report  Result Date: 11/20/2017 Please see Notes or Procedures tab for imaging impression.   Past Medical/Family/Surgical/Social History: Medications & Allergies reviewed per EMR Patient Active Problem List   Diagnosis Date Noted  . Chronic bilateral low back pain with bilateral sciatica 09/27/2017  . Bilateral chronic knee pain 03/13/2017  . Pleural effusion 03/10/2013  . Hyponatremia 03/10/2013  . Hypertension 03/10/2013  . Degenerative arthritis of hip 03/08/2013   Past Medical History:  Diagnosis Date  . Arthritis    osteoarthritis. spinal stenosis. Scoliosis of spine-degenerative spine.  Marland Kitchen GERD (gastroesophageal reflux disease)    controls with Nexium  . Hypertension    History reviewed. No pertinent family history. Past Surgical History:  Procedure Laterality Date  . CATARACT EXTRACTION  03-06-13   left eye  . CHOLECYSTECTOMY    . DILATION AND CURETTAGE OF UTERUS    . TOTAL HIP ARTHROPLASTY Left 03/08/2013   Procedure: LEFT TOTAL HIP ARTHROPLASTY ANTERIOR APPROACH;  Surgeon: Mcarthur Rossetti, MD;  Location: [facility];  Service: Orthopedics;  Laterality: Left;   Social History   Occupational History  . Not on file  Tobacco Use  . Smoking status: Never Smoker  . Smokeless tobacco: Never Used  Substance and Sexual Activity  . Alcohol use: No  . Drug use: No  . Sexual activity: Not Currently

## 2017-11-24 NOTE — Procedures (Signed)
Deborah Jordan is an 83 year old female who is here for planned bilateral L3-4 and L4-5 facet joint blocks.  Please see our prior evaluation and management note for further details and justification.  Lumbar Facet Joint Intra-Articular Injection(s) with Fluoroscopic Guidance  Patient: Deborah Jordan      Date of Birth: Jul 31, 1933 MRN: [id number] PCP: [name], MD      Visit Date: 11/20/2017   Universal Protocol:    Date/Time: 11/20/2017  Consent Given By: the patient  Position: PRONE   Additional Comments: Vital signs were monitored before and after the procedure. Patient was prepped and draped in the usual sterile fashion. The correct patient, procedure, and site was verified.   Injection Procedure Details:  Procedure Site One Meds Administered:  Meds ordered this encounter  Medications  . lidocaine (PF) (XYLOCAINE) 1 % injection 2 mL  . methylPREDNISolone acetate (DEPO-MEDROL) injection 80 mg     Laterality: Bilateral  Location/Site:  L3-L4 L4-L5  Needle size: 22 guage  Needle type: Spinal  Needle Placement: Articular  Findings:  -Comments: Excellent flow of contrast producing a partial arthrogram.  Procedure Details: The fluoroscope beam is vertically oriented in AP, and the inferior recess is visualized beneath the lower pole of the inferior apophyseal process, which represents the target point for needle insertion. When direct visualization is difficult the target point is located at the medial projection of the vertebral pedicle. The region overlying each aforementioned target is locally anesthetized with a 1 to 2 ml. volume of 1% Lidocaine without Epinephrine.   The spinal needle was inserted into each of the above mentioned facet joints using biplanar fluoroscopic guidance. A 0.25 to 0.5 ml. volume of Isovue-250 was injected and a partial facet joint arthrogram was obtained. A single spot film was obtained of the resulting arthrogram.    One to 1.25 ml of the  steroid/anesthetic solution was then injected into each of the facet joints noted above.   Additional Comments:  The patient tolerated the procedure well Dressing: Band-Aid    Post-procedure details: Patient was observed during the procedure. Post-procedure instructions were reviewed.  Patient left the clinic in stable condition.  Pertinent Imaging: MRI LUMBAR SPINE WITHOUT CONTRAST  TECHNIQUE: Multiplanar, multisequence MR imaging of the lumbar spine was performed. No intravenous contrast was administered.  COMPARISON:  Lumbar spine radiograph 01/30/2017  Lumbar spine MRI 08/12/2010  FINDINGS: Segmentation:  Standard.  Alignment:  Right convex scoliosis with apex at L3.  Vertebrae:  Discogenic endplate signal changes at L1-L2 and L2-L3.  Conus medullaris: Extends to the L2 level. There is a mildly dilated central canal of the conus medullaris measuring 1.1 mm. This finding is unchanged.  Paraspinal and other soft tissues: Negative.  Disc levels:  T11-T12:  Mild disc bulge without stenosis.  T12-L1: Normal disc space and facets. No spinal canal or neuroforaminal stenosis.  L1-L2: Right eccentric disc bulge with small amount of fluid in the right facet joint. No spinal canal stenosis. Moderate right and mild left neural foraminal stenosis. This has worsened from the prior study.  L2-L3: There is a a right eccentric intermediate disc bulge. No central spinal canal stenosis. Moderate right and severe left neural foraminal stenosis, worsened from the prior study.  L3-L4: Intermediate diffuse disc bulge, left eccentric. Moderate spinal canal stenosis, unchanged. Unchanged severe left neural foraminal stenosis.  L4-L5: Large disc bulge with a component of central extrusion with inferior migration to the suprapedicle L5 level. Moderate bilateral facet hypertrophy. Severe spinal canal stenosis, slightly  worsened. Bilateral neural foraminal stenosis  is also worsened, severe on the right and moderate to severe on the left.  L5-S1: Small disc bulge moderate facet hypertrophy, unchanged. No spinal canal stenosis. Mild right foraminal stenosis, unchanged.  Visualized sacrum: Normal.  IMPRESSION: 1. Worsening of L4-L5 degenerative disease with severe spinal canal stenosis and severe right, moderate to severe left neural foraminal stenosis. 2. Worsening degenerative disease at L1-L2 and L2-L3 with moderate right L1-2 foraminal stenosis, moderate right L2-3 foraminal stenosis and severe left L2-3 foraminal stenosis. 3. Unchanged moderate spinal canal stenosis and bilateral severe neural foraminal stenosis at L3-L4.   Electronically Signed   By: Ulyses Jarred M.D.   On: 10/19/2017 03:45

## 2018-03-16 ENCOUNTER — Other Ambulatory Visit: Payer: Self-pay | Admitting: Family Medicine

## 2018-03-16 DIAGNOSIS — E2839 Other primary ovarian failure: Secondary | ICD-10-CM

## 2018-03-16 DIAGNOSIS — Z139 Encounter for screening, unspecified: Secondary | ICD-10-CM

## 2018-04-16 ENCOUNTER — Ambulatory Visit
Admission: RE | Admit: 2018-04-16 | Discharge: 2018-04-16 | Disposition: A | Discharge status: Home / Self Care | Payer: Medicare Other | Source: Ambulatory Visit | Attending: Family Medicine | Admitting: Family Medicine

## 2018-04-16 DIAGNOSIS — E2839 Other primary ovarian failure: Secondary | ICD-10-CM

## 2018-04-16 DIAGNOSIS — Z139 Encounter for screening, unspecified: Secondary | ICD-10-CM

## 2018-09-04 ENCOUNTER — Ambulatory Visit: Payer: Self-pay | Admitting: General Surgery

## 2018-09-04 NOTE — H&P (View-Only) (Signed)
History of Present Illness Deborah Ruff MD; 5/64/3329 10:45 AM) The patient is a 84 year old female who presents with anal lesions. 84 year old female who presents to the office as a referral from Dr. Stephanie Acre. She was noted to have an anal mass on physical exam. Patient states that she has had occasional rectal bleeding with bowel movements for approximately 2 years. She did undergo a colonoscopy which revealed some mild hemorrhoids as well as several small polyps. She denies any anal pain but does note the rectal mass on several occasions and that it was difficult to reduce.   Past Surgical History [facility], LPN; 05/06/8415 60:63 AM) Colon Polyp Removal - Colonoscopy Gallbladder Surgery - Laparoscopic Hip Surgery Left.  Diagnostic Studies History [facility], LPN; 0/16/0109 32:35 AM) Colonoscopy 1-5 years ago Mammogram within last year  Allergies [facility], LPN; 5/73/2202 54:27 AM) No Known Drug Allergies [09/04/2018]: Allergies Reconciled  Medication History (Hadelyn Massenburg, LPN; 0/62/3762 83:15 AM) Losartan Potassium (100MG  Tablet, Oral) Active. Carvedilol (6.25MG  Tablet, Oral) Active. Atorvastatin Calcium (10MG  Tablet, Oral) Active. amLODIPine Besylate (10MG  Tablet, Oral) Active. ALPRAZolam (0.25MG  Tablet, Oral) Active. Vitamin D (1000UNIT Tablet, Oral) Active. NexIUM (5MG  Packet, Oral) Active.  Social History (Hadelyn Massenburg, LPN; 1/76/1607 37:10 AM) Alcohol use Moderate alcohol use. Caffeine use Carbonated beverages, Coffee, Tea. Tobacco use Never smoker.  Family History Harriet Pho, LPN; 06/13/9484 46:27 AM) Arthritis Mother. Breast Cancer Sister. Heart Disease Father, Sister. Hypertension Mother. Melanoma Sister. Migraine Headache Daughter.  Pregnancy / Birth History Harriet Pho, LPN; 0/35/0093 81:82 AM) Age at menarche 89 years. Age of menopause <47 Gravida 2 Maternal age  57-25 Para 2  Other Problems (Hadelyn Massenburg, LPN; 9/93/7169 67:89 AM) Anxiety Disorder Arthritis Bladder Problems Diverticulosis Gastroesophageal Reflux Disease Hemorrhoids High blood pressure     Review of Systems (Hadelyn Massenburg LPN; 3/81/0175 10:25 AM) General Not Present- Appetite Loss, Chills, Fatigue, Fever, Night Sweats, Weight Gain and Weight Loss. Skin Not Present- Change in Wart/Mole, Dryness, Hives, Jaundice, New Lesions, Non-Healing Wounds, Rash and Ulcer. HEENT Not Present- Earache, Hearing Loss, Hoarseness, Nose Bleed, Oral Ulcers, Ringing in the Ears, Seasonal Allergies, Sinus Pain, Sore Throat, Visual Disturbances, Wears glasses/contact lenses and Yellow Eyes. Breast Not Present- Breast Mass, Breast Pain, Nipple Discharge and Skin Changes. Cardiovascular Not Present- Chest Pain, Difficulty Breathing Lying Down, Leg Cramps, Palpitations, Rapid Heart Rate, Shortness of Breath and Swelling of Extremities. Gastrointestinal Present- Hemorrhoids. Not Present- Abdominal Pain, Bloating, Bloody Stool, Change in Bowel Habits, Chronic diarrhea, Constipation, Difficulty Swallowing, Excessive gas, Gets full quickly at meals, Indigestion, Nausea, Rectal Pain and Vomiting. Musculoskeletal Present- Back Pain and Joint Pain. Not Present- Joint Stiffness, Muscle Pain, Muscle Weakness and Swelling of Extremities. Neurological Not Present- Decreased Memory, Fainting, Headaches, Numbness, Seizures, Tingling, Tremor, Trouble walking and Weakness. Endocrine Not Present- Cold Intolerance, Excessive Hunger, Hair Changes, Heat Intolerance, Hot flashes and New Diabetes. Hematology Not Present- Blood Thinners, Easy Bruising, Excessive bleeding, Gland problems, HIV and Persistent Infections.  Vitals (Hadelyn Massenburg LPN; 8/52/7782 42:35 AM) 09/04/2018 10:40 AM Weight: 177.13 lb Height: 58in Body Surface Area: 1.73 m Body Mass Index: 37.02 kg/m  Temp.: 56F(Oral)   Pulse: 76 (Regular)  Resp.: 18 (Unlabored)  P.OX: 98% (Room air) BP: 128/82 (Sitting, Left Arm, Standard)      Physical Exam Deborah Ruff MD; 3/61/4431 10:52 AM)  General Mental Status-Alert. General Appearance-Not in acute distress. Build & Nutrition-Well nourished. Posture-Normal posture. Gait-Normal.  Head and Neck Head-normocephalic, atraumatic with no lesions or palpable masses. Trachea-midline.  Chest  and Lung Exam Chest and lung exam reveals -on auscultation, normal breath sounds, no adventitious sounds and normal vocal resonance.  Cardiovascular Cardiovascular examination reveals -normal heart sounds, regular rate and rhythm with no murmurs and no digital clubbing, cyanosis, edema, increased warmth or tenderness.  Abdomen Inspection Inspection of the abdomen reveals - No Hernias. Palpation/Percussion Palpation and Percussion of the abdomen reveal - Soft, Non Tender, No Rigidity (guarding), No hepatosplenomegaly and No Palpable abdominal masses.  Rectal Anorectal Exam External - skin tag. Internal - normal sphincter tone.  Neurologic Neurologic evaluation reveals -alert and oriented x 3 with no impairment of recent or remote memory, normal attention span and ability to concentrate, normal sensation and normal coordination.  Musculoskeletal Normal Exam - Bilateral-Upper Extremity Strength Normal and Lower Extremity Strength Normal.   Results Deborah Ruff MD; 03/11/4009 10:54 AM) Procedures  Name Value Date ANOSCOPY, DIAGNOSTIC (27253) [ Hemorrhoids ] Procedure Other: Procedure: Anoscopy....Marland KitchenMarland KitchenSurgeon: Marcello Moores....Marland KitchenMarland KitchenAfter the risks and benefits were explained, verbal consent was obtained for above procedure. A medical assistant chaperone was present thoroughout the entire procedure. ....Marland KitchenMarland KitchenAnesthesia: none....Marland KitchenMarland KitchenDiagnosis: anal bleeding....Marland KitchenMarland KitchenFindings: Grade 3 right posterior internal hemorrhoid with polypoid changes  consistent with chronic inflammation. No other hemorrhoid disease noted internally.  Performed: 09/04/2018 10:52 AM    Assessment & Plan Deborah Ruff MD; 6/64/4034 10:55 AM)  PROLAPSED INTERNAL HEMORRHOIDS, GRADE 3 (V42.5) Impression: 84 year old female with a grade 3 right posterior internal hemorrhoid with polypoid changes. I have recommended single column hemorrhoidectomy. We have discussed this in detail including postoperative pain and bleeding. We discussed that this will likely help control her rectal bleeding and fecal drainage that she notices on a daily basis. I believe she understands this and wishes to proceed with surgery. We will get this scheduled at her convenience.

## 2018-09-04 NOTE — H&P (Signed)
History of Present Illness Leighton Ruff MD; 5/62/1308 10:45 AM) The patient is a 84 year old female who presents with anal lesions. 84 year old female who presents to the office as a referral from Dr. Stephanie Acre. She was noted to have an anal mass on physical exam. Patient states that she has had occasional rectal bleeding with bowel movements for approximately 2 years. She did undergo a colonoscopy which revealed some mild hemorrhoids as well as several small polyps. She denies any anal pain but does note the rectal mass on several occasions and that it was difficult to reduce.   Past Surgical History [facility], LPN; 6/57/8469 62:95 AM) Colon Polyp Removal - Colonoscopy Gallbladder Surgery - Laparoscopic Hip Surgery Left.  Diagnostic Studies History [facility], LPN; 2/84/1324 40:10 AM) Colonoscopy 1-5 years ago Mammogram within last year  Allergies [facility], LPN; 2/72/5366 44:03 AM) No Known Drug Allergies [09/04/2018]: Allergies Reconciled  Medication History (Hadelyn Massenburg, LPN; 4/74/2595 63:87 AM) Losartan Potassium (100MG  Tablet, Oral) Active. Carvedilol (6.25MG  Tablet, Oral) Active. Atorvastatin Calcium (10MG  Tablet, Oral) Active. amLODIPine Besylate (10MG  Tablet, Oral) Active. ALPRAZolam (0.25MG  Tablet, Oral) Active. Vitamin D (1000UNIT Tablet, Oral) Active. NexIUM (5MG  Packet, Oral) Active.  Social History (Hadelyn Massenburg, LPN; 5/64/3329 51:88 AM) Alcohol use Moderate alcohol use. Caffeine use Carbonated beverages, Coffee, Tea. Tobacco use Never smoker.  Family History Harriet Pho, LPN; 04/03/6062 01:60 AM) Arthritis Mother. Breast Cancer Sister. Heart Disease Father, Sister. Hypertension Mother. Melanoma Sister. Migraine Headache Daughter.  Pregnancy / Birth History Harriet Pho, LPN; 12/27/3233 57:32 AM) Age at menarche 49 years. Age of menopause <47 Gravida 2 Maternal age  51-25 Para 2  Other Problems (Hadelyn Massenburg, LPN; 01/20/5426 06:23 AM) Anxiety Disorder Arthritis Bladder Problems Diverticulosis Gastroesophageal Reflux Disease Hemorrhoids High blood pressure     Review of Systems (Hadelyn Massenburg LPN; 7/62/8315 17:61 AM) General Not Present- Appetite Loss, Chills, Fatigue, Fever, Night Sweats, Weight Gain and Weight Loss. Skin Not Present- Change in Wart/Mole, Dryness, Hives, Jaundice, New Lesions, Non-Healing Wounds, Rash and Ulcer. HEENT Not Present- Earache, Hearing Loss, Hoarseness, Nose Bleed, Oral Ulcers, Ringing in the Ears, Seasonal Allergies, Sinus Pain, Sore Throat, Visual Disturbances, Wears glasses/contact lenses and Yellow Eyes. Breast Not Present- Breast Mass, Breast Pain, Nipple Discharge and Skin Changes. Cardiovascular Not Present- Chest Pain, Difficulty Breathing Lying Down, Leg Cramps, Palpitations, Rapid Heart Rate, Shortness of Breath and Swelling of Extremities. Gastrointestinal Present- Hemorrhoids. Not Present- Abdominal Pain, Bloating, Bloody Stool, Change in Bowel Habits, Chronic diarrhea, Constipation, Difficulty Swallowing, Excessive gas, Gets full quickly at meals, Indigestion, Nausea, Rectal Pain and Vomiting. Musculoskeletal Present- Back Pain and Joint Pain. Not Present- Joint Stiffness, Muscle Pain, Muscle Weakness and Swelling of Extremities. Neurological Not Present- Decreased Memory, Fainting, Headaches, Numbness, Seizures, Tingling, Tremor, Trouble walking and Weakness. Endocrine Not Present- Cold Intolerance, Excessive Hunger, Hair Changes, Heat Intolerance, Hot flashes and New Diabetes. Hematology Not Present- Blood Thinners, Easy Bruising, Excessive bleeding, Gland problems, HIV and Persistent Infections.  Vitals (Hadelyn Massenburg LPN; 05/25/3709 62:69 AM) 09/04/2018 10:40 AM Weight: 177.13 lb Height: 58in Body Surface Area: 1.73 m Body Mass Index: 37.02 kg/m  Temp.: 67F(Oral)   Pulse: 76 (Regular)  Resp.: 18 (Unlabored)  P.OX: 98% (Room air) BP: 128/82 (Sitting, Left Arm, Standard)      Physical Exam Leighton Ruff MD; 4/85/4627 10:52 AM)  General Mental Status-Alert. General Appearance-Not in acute distress. Build & Nutrition-Well nourished. Posture-Normal posture. Gait-Normal.  Head and Neck Head-normocephalic, atraumatic with no lesions or palpable masses. Trachea-midline.  Chest  and Lung Exam Chest and lung exam reveals -on auscultation, normal breath sounds, no adventitious sounds and normal vocal resonance.  Cardiovascular Cardiovascular examination reveals -normal heart sounds, regular rate and rhythm with no murmurs and no digital clubbing, cyanosis, edema, increased warmth or tenderness.  Abdomen Inspection Inspection of the abdomen reveals - No Hernias. Palpation/Percussion Palpation and Percussion of the abdomen reveal - Soft, Non Tender, No Rigidity (guarding), No hepatosplenomegaly and No Palpable abdominal masses.  Rectal Anorectal Exam External - skin tag. Internal - normal sphincter tone.  Neurologic Neurologic evaluation reveals -alert and oriented x 3 with no impairment of recent or remote memory, normal attention span and ability to concentrate, normal sensation and normal coordination.  Musculoskeletal Normal Exam - Bilateral-Upper Extremity Strength Normal and Lower Extremity Strength Normal.   Results Leighton Ruff MD; 01/06/4173 10:54 AM) Procedures  Name Value Date ANOSCOPY, DIAGNOSTIC (08144) [ Hemorrhoids ] Procedure Other: Procedure: Anoscopy....Marland KitchenMarland KitchenSurgeon: Marcello Moores....Marland KitchenMarland KitchenAfter the risks and benefits were explained, verbal consent was obtained for above procedure. A medical assistant chaperone was present thoroughout the entire procedure. ....Marland KitchenMarland KitchenAnesthesia: none....Marland KitchenMarland KitchenDiagnosis: anal bleeding....Marland KitchenMarland KitchenFindings: Grade 3 right posterior internal hemorrhoid with polypoid changes  consistent with chronic inflammation. No other hemorrhoid disease noted internally.  Performed: 09/04/2018 10:52 AM    Assessment & Plan Leighton Ruff MD; 08/05/5630 10:55 AM)  PROLAPSED INTERNAL HEMORRHOIDS, GRADE 3 (S97.0) Impression: 84 year old female with a grade 3 right posterior internal hemorrhoid with polypoid changes. I have recommended single column hemorrhoidectomy. We have discussed this in detail including postoperative pain and bleeding. We discussed that this will likely help control her rectal bleeding and fecal drainage that she notices on a daily basis. I believe she understands this and wishes to proceed with surgery. We will get this scheduled at her convenience.

## 2018-09-11 ENCOUNTER — Encounter (INDEPENDENT_AMBULATORY_CARE_PROVIDER_SITE_OTHER): Payer: Self-pay | Admitting: Orthopaedic Surgery

## 2018-09-11 ENCOUNTER — Ambulatory Visit (INDEPENDENT_AMBULATORY_CARE_PROVIDER_SITE_OTHER): Payer: Self-pay

## 2018-09-11 ENCOUNTER — Ambulatory Visit (INDEPENDENT_AMBULATORY_CARE_PROVIDER_SITE_OTHER): Payer: Medicare Other | Admitting: Orthopaedic Surgery

## 2018-09-11 DIAGNOSIS — M25552 Pain in left hip: Secondary | ICD-10-CM

## 2018-09-11 NOTE — Progress Notes (Signed)
Office Visit Note   Patient: Deborah Jordan           Date of Birth: 1933-07-21           MRN: [id number] Visit Date: 09/11/2018              Requested by: [name], MD [address] [place], [place] [address] PCP: [name], MD   Assessment & Plan: Visit Diagnoses:  1. Pain in left hip     Plan:  Advised her that contusion of the hip may take several months to totally dissipate.  She is activities as tolerated.  Moist heat to the hip over the area of ecchymosis.  Follow-up pain persist or becomes worse.  Questions encouraged and  Follow-Up Instructions: Return if symptoms worsen or fail to improve.   Orders:  Orders Placed This Encounter  Procedures  . XR HIP UNILAT W OR W/O PELVIS 2-3 VIEWS LEFT   No orders of the defined types were placed in this encounter.     Procedures: No procedures performed   Clinical Data: No additional findings.   Subjective: Chief Complaint  Patient presents with  . Left Hip - Pain    HPI Mr. Hewson is an 84 year old female well-known to Dr. Ninfa Linden service comes in today with left hip pain.  Last Wednesday she was walking to either dinner table was walking her socks and slipped.  She had no loss consciousness no dizziness no chest pain no shortness of breath.  She fell onto her left hip.  She was able to ambulate after this but developed a large bruise.  She denies any other injury.  She has a history of left total hip arthroplasty in 2014. Review of Systems Please see HPI otherwise negative  Objective: Vital Signs: There were no vitals taken for this visit.  Physical Exam  Constitutional: She is oriented to person, place, and time. She appears well-developed and well-nourished. No distress.  Pulmonary/Chest: Effort normal.  Neurological: She is alert and oriented to person, place, and time.  Skin: She is not diaphoretic.  Psychiatric: She has a normal mood and affect.    Ortho Exam Left hip she  has a large area of ecchymosis over the lateral proximal femur region.  Good range of motion of both hips without pain.  Left knee with no effusion abnormal warmth erythema good range of motion without pain.  Left calf supple nontender. Specialty Comments:  No specialty comments available.  Imaging: Xr Hip Unilat W Or W/o Pelvis 2-3 Views Left  Result Date: 09/11/2018  AP pelvis lateral view of the left hip: No acute fracture.  Left total hip arthroplasty components well-seated.  Right hip with near bone-on-bone arthritic changes.Bilateral hips well located.     PMFS History: Patient Active Problem List   Diagnosis Date Noted  . Chronic bilateral low back pain with bilateral sciatica 09/27/2017  . Bilateral chronic knee pain 03/13/2017  . Pleural effusion 03/10/2013  . Hyponatremia 03/10/2013  . Hypertension 03/10/2013  . Degenerative arthritis of hip 03/08/2013   Past Medical History:  Diagnosis Date  . Arthritis    osteoarthritis. spinal stenosis. Scoliosis of spine-degenerative spine.  Marland Kitchen GERD (gastroesophageal reflux disease)    controls with Nexium  . Hypertension     History reviewed. No pertinent family history.  Past Surgical History:  Procedure Laterality Date  . CATARACT EXTRACTION  03-06-13   left eye  . CHOLECYSTECTOMY    . DILATION AND CURETTAGE  OF UTERUS    . TOTAL HIP ARTHROPLASTY Left 03/08/2013   Procedure: LEFT TOTAL HIP ARTHROPLASTY ANTERIOR APPROACH;  Surgeon: Mcarthur Rossetti, MD;  Location: [facility];  Service: Orthopedics;  Laterality: Left;   Social History   Occupational History  . Not on file  Tobacco Use  . Smoking status: Never Smoker  . Smokeless tobacco: Never Used  Substance and Sexual Activity  . Alcohol use: No  . Drug use: No  . Sexual activity: Not Currently

## 2018-09-18 ENCOUNTER — Encounter (HOSPITAL_BASED_OUTPATIENT_CLINIC_OR_DEPARTMENT_OTHER): Payer: Self-pay

## 2018-09-19 ENCOUNTER — Other Ambulatory Visit: Payer: Self-pay

## 2018-09-19 ENCOUNTER — Encounter (HOSPITAL_BASED_OUTPATIENT_CLINIC_OR_DEPARTMENT_OTHER): Payer: Self-pay

## 2018-09-19 NOTE — Progress Notes (Signed)
Spoke with:  Maia NPO:  No food after midnight/Clear liquids until 8:30AM DOS Arrival time: 1230PM Labs: Istat4, EKG AM medications: Amlodipine, Carvedilol, Nexium, Atorvastatin, Xanax if needed Pre op orders: Yes Ride home:  Meredith Mody (daughter) [phone]

## 2018-09-20 MED ORDER — BUPIVACAINE LIPOSOME 1.3 % IJ SUSP
20.0000 mL | Freq: Once | INTRAMUSCULAR | Status: DC
  Filled 2018-09-20 (×3): qty 20

## 2018-09-21 ENCOUNTER — Ambulatory Visit (HOSPITAL_BASED_OUTPATIENT_CLINIC_OR_DEPARTMENT_OTHER): Payer: Medicare Other | Admitting: Anesthesiology

## 2018-09-21 ENCOUNTER — Ambulatory Visit (HOSPITAL_BASED_OUTPATIENT_CLINIC_OR_DEPARTMENT_OTHER)
Admission: RE | Admit: 2018-09-21 | Discharge: 2018-09-21 | Disposition: A | Discharge status: Home / Self Care | Payer: Medicare Other | Source: Ambulatory Visit | Attending: General Surgery | Admitting: General Surgery

## 2018-09-21 ENCOUNTER — Other Ambulatory Visit: Payer: Self-pay

## 2018-09-21 ENCOUNTER — Encounter (HOSPITAL_BASED_OUTPATIENT_CLINIC_OR_DEPARTMENT_OTHER): Payer: Self-pay | Admitting: Anesthesiology

## 2018-09-21 ENCOUNTER — Encounter (HOSPITAL_BASED_OUTPATIENT_CLINIC_OR_DEPARTMENT_OTHER)
Admission: RE | Disposition: A | Discharge status: Home / Self Care | Payer: Self-pay | Source: Ambulatory Visit | Attending: General Surgery

## 2018-09-21 DIAGNOSIS — F419 Anxiety disorder, unspecified: Secondary | ICD-10-CM | POA: Insufficient documentation

## 2018-09-21 DIAGNOSIS — I10 Essential (primary) hypertension: Secondary | ICD-10-CM | POA: Diagnosis not present

## 2018-09-21 DIAGNOSIS — K219 Gastro-esophageal reflux disease without esophagitis: Secondary | ICD-10-CM | POA: Diagnosis not present

## 2018-09-21 DIAGNOSIS — Z79899 Other long term (current) drug therapy: Secondary | ICD-10-CM | POA: Diagnosis not present

## 2018-09-21 DIAGNOSIS — K642 Third degree hemorrhoids: Secondary | ICD-10-CM | POA: Diagnosis present

## 2018-09-21 HISTORY — DX: Anxiety disorder, unspecified: F41.9

## 2018-09-21 HISTORY — DX: Unspecified right bundle-branch block: I45.10

## 2018-09-21 HISTORY — DX: Heart disease, unspecified: I51.9

## 2018-09-21 HISTORY — DX: Spinal stenosis, site unspecified: M48.00

## 2018-09-21 HISTORY — DX: Other intervertebral disc degeneration, lumbar region without mention of lumbar back pain or lower extremity pain: M51.369

## 2018-09-21 HISTORY — DX: Other intervertebral disc degeneration, lumbar region: M51.36

## 2018-09-21 HISTORY — DX: Cardiomegaly: I51.7

## 2018-09-21 HISTORY — DX: Unspecified cataract: H26.9

## 2018-09-21 HISTORY — DX: Chronic kidney disease, unspecified: N18.9

## 2018-09-21 HISTORY — PX: HEMORRHOID SURGERY: SHX153

## 2018-09-21 HISTORY — DX: Dyspnea, unspecified: R06.00

## 2018-09-21 HISTORY — DX: Personal history of other diseases of the digestive system: Z87.19

## 2018-09-21 HISTORY — DX: Personal history of other diseases of the circulatory system: Z86.79

## 2018-09-21 HISTORY — DX: Prediabetes: R73.03

## 2018-09-21 HISTORY — DX: Presence of dental prosthetic device (complete) (partial): Z97.2

## 2018-09-21 LAB — POCT I-STAT 4, (NA,K, GLUC, HGB,HCT)
Glucose, Bld: 100 mg/dL — ABNORMAL HIGH (ref 70–99)
HCT: 38 % (ref 36.0–46.0)
Hemoglobin: 12.9 g/dL (ref 12.0–15.0)
Potassium: 4.3 mmol/L (ref 3.5–5.1)
Sodium: 138 mmol/L (ref 135–145)

## 2018-09-21 SURGERY — HEMORRHOIDECTOMY
Anesthesia: General | Site: Anus

## 2018-09-21 MED ORDER — KETOROLAC TROMETHAMINE 30 MG/ML IJ SOLN
INTRAMUSCULAR | Status: DC | PRN
  Administered 2018-09-21: 15 mg via INTRAVENOUS

## 2018-09-21 MED ORDER — MEPERIDINE HCL 25 MG/ML IJ SOLN
6.2500 mg | INTRAMUSCULAR | Status: DC | PRN
  Filled 2018-09-21: qty 1

## 2018-09-21 MED ORDER — OXYCODONE HCL 5 MG PO TABS
5.0000 mg | ORAL_TABLET | Freq: Once | ORAL | Status: DC | PRN
  Filled 2018-09-21: qty 1

## 2018-09-21 MED ORDER — FENTANYL CITRATE (PF) 100 MCG/2ML IJ SOLN
25.0000 ug | INTRAMUSCULAR | Status: DC | PRN
  Filled 2018-09-21: qty 1

## 2018-09-21 MED ORDER — SODIUM CHLORIDE 0.9 % IV SOLN
250.0000 mL | INTRAVENOUS | Status: DC | PRN
  Filled 2018-09-21: qty 250

## 2018-09-21 MED ORDER — LIDOCAINE 5 % EX OINT
TOPICAL_OINTMENT | CUTANEOUS | Status: DC | PRN
  Administered 2018-09-21: 1

## 2018-09-21 MED ORDER — SODIUM CHLORIDE 0.9% FLUSH
3.0000 mL | INTRAVENOUS | Status: DC | PRN
  Filled 2018-09-21: qty 3

## 2018-09-21 MED ORDER — OXYCODONE HCL 5 MG PO TABS
5.0000 mg | ORAL_TABLET | ORAL | Status: DC | PRN
  Filled 2018-09-21: qty 2

## 2018-09-21 MED ORDER — OXYCODONE HCL 5 MG/5ML PO SOLN
5.0000 mg | Freq: Once | ORAL | Status: DC | PRN
  Filled 2018-09-21: qty 5

## 2018-09-21 MED ORDER — DEXAMETHASONE SODIUM PHOSPHATE 4 MG/ML IJ SOLN
INTRAMUSCULAR | Status: DC | PRN
  Administered 2018-09-21: 5 mg via INTRAVENOUS

## 2018-09-21 MED ORDER — OXYCODONE HCL 5 MG PO TABS: 5.0000 mg | ORAL_TABLET | ORAL | 0 refills | Status: DC | PRN

## 2018-09-21 MED ORDER — ACETAMINOPHEN 325 MG PO TABS
650.0000 mg | ORAL_TABLET | ORAL | Status: DC | PRN
  Filled 2018-09-21: qty 2

## 2018-09-21 MED ORDER — ACETAMINOPHEN 325 MG PO TABS
325.0000 mg | ORAL_TABLET | ORAL | Status: DC | PRN
  Filled 2018-09-21: qty 2

## 2018-09-21 MED ORDER — ONDANSETRON HCL 4 MG/2ML IJ SOLN
INTRAMUSCULAR | Status: DC | PRN
  Administered 2018-09-21: 4 mg via INTRAVENOUS

## 2018-09-21 MED ORDER — PROPOFOL 10 MG/ML IV BOLUS
INTRAVENOUS | Status: AC
  Filled 2018-09-21: qty 20

## 2018-09-21 MED ORDER — GABAPENTIN 300 MG PO CAPS
300.0000 mg | ORAL_CAPSULE | ORAL | Status: AC
  Administered 2018-09-21: 300 mg via ORAL
  Filled 2018-09-21: qty 1

## 2018-09-21 MED ORDER — LIDOCAINE 2% (20 MG/ML) 5 ML SYRINGE
INTRAMUSCULAR | Status: AC
  Filled 2018-09-21: qty 5

## 2018-09-21 MED ORDER — GABAPENTIN 300 MG PO CAPS
ORAL_CAPSULE | ORAL | Status: AC
  Filled 2018-09-21: qty 1

## 2018-09-21 MED ORDER — ACETAMINOPHEN 160 MG/5ML PO SOLN
325.0000 mg | ORAL | Status: DC | PRN
  Filled 2018-09-21: qty 20.3

## 2018-09-21 MED ORDER — SUCCINYLCHOLINE CHLORIDE 20 MG/ML IJ SOLN
INTRAMUSCULAR | Status: DC | PRN
  Administered 2018-09-21: 100 mg via INTRAVENOUS

## 2018-09-21 MED ORDER — ACETAMINOPHEN 500 MG PO TABS
1000.0000 mg | ORAL_TABLET | ORAL | Status: AC
  Administered 2018-09-21: 1000 mg via ORAL
  Filled 2018-09-21: qty 2

## 2018-09-21 MED ORDER — SODIUM CHLORIDE 0.9% FLUSH
3.0000 mL | Freq: Two times a day (BID) | INTRAVENOUS | Status: DC
  Filled 2018-09-21: qty 3

## 2018-09-21 MED ORDER — FENTANYL CITRATE (PF) 100 MCG/2ML IJ SOLN
INTRAMUSCULAR | Status: AC
  Filled 2018-09-21: qty 2

## 2018-09-21 MED ORDER — PROPOFOL 10 MG/ML IV BOLUS
INTRAVENOUS | Status: DC | PRN
  Administered 2018-09-21: 100 mg via INTRAVENOUS

## 2018-09-21 MED ORDER — FENTANYL CITRATE (PF) 100 MCG/2ML IJ SOLN
INTRAMUSCULAR | Status: DC | PRN
  Administered 2018-09-21 (×4): 25 ug via INTRAVENOUS

## 2018-09-21 MED ORDER — EPHEDRINE 5 MG/ML INJ
INTRAVENOUS | Status: AC
  Filled 2018-09-21: qty 10

## 2018-09-21 MED ORDER — BUPIVACAINE-EPINEPHRINE 0.5% -1:200000 IJ SOLN
INTRAMUSCULAR | Status: DC | PRN
  Administered 2018-09-21: 30 mL

## 2018-09-21 MED ORDER — ONDANSETRON HCL 4 MG/2ML IJ SOLN
INTRAMUSCULAR | Status: AC
  Filled 2018-09-21: qty 2

## 2018-09-21 MED ORDER — SODIUM CHLORIDE 0.9 % IV SOLN
INTRAVENOUS | Status: DC
  Administered 2018-09-21: 13:00:00 via INTRAVENOUS
  Filled 2018-09-21: qty 1000

## 2018-09-21 MED ORDER — ACETAMINOPHEN 650 MG RE SUPP
650.0000 mg | RECTAL | Status: DC | PRN
  Filled 2018-09-21: qty 1

## 2018-09-21 MED ORDER — ACETAMINOPHEN 500 MG PO TABS
ORAL_TABLET | ORAL | Status: AC
  Filled 2018-09-21: qty 2

## 2018-09-21 MED ORDER — SUCCINYLCHOLINE CHLORIDE 200 MG/10ML IV SOSY
PREFILLED_SYRINGE | INTRAVENOUS | Status: AC
  Filled 2018-09-21: qty 10

## 2018-09-21 MED ORDER — LIDOCAINE HCL (CARDIAC) PF 100 MG/5ML IV SOSY
PREFILLED_SYRINGE | INTRAVENOUS | Status: DC | PRN
  Administered 2018-09-21: 60 mg via INTRAVENOUS

## 2018-09-21 MED ORDER — ONDANSETRON HCL 4 MG/2ML IJ SOLN
4.0000 mg | Freq: Once | INTRAMUSCULAR | Status: DC | PRN
  Filled 2018-09-21: qty 2

## 2018-09-21 MED ORDER — EPHEDRINE SULFATE 50 MG/ML IJ SOLN
INTRAMUSCULAR | Status: DC | PRN
  Administered 2018-09-21 (×2): 20 mg via INTRAVENOUS

## 2018-09-21 MED ORDER — BUPIVACAINE LIPOSOME 1.3 % IJ SUSP
INTRAMUSCULAR | Status: DC | PRN
  Administered 2018-09-21: 20 mL

## 2018-09-21 SURGICAL SUPPLY — 36 items
BLADE EXTENDED COATED 6.5IN (ELECTRODE) IMPLANT
BLADE HEX COATED 2.75 (ELECTRODE) ×2 IMPLANT
BLADE SURG 10 STRL SS (BLADE) IMPLANT
BRIEF STRETCH FOR OB PAD LRG (UNDERPADS AND DIAPERS) ×2 IMPLANT
COVER BACK TABLE 60X90IN (DRAPES) ×2 IMPLANT
COVER MAYO STAND STRL (DRAPES) ×2 IMPLANT
DRAPE LAPAROTOMY 100X72 PEDS (DRAPES) ×2 IMPLANT
DRAPE UTILITY XL STRL (DRAPES) ×2 IMPLANT
ELECT REM PT RETURN 9FT ADLT (ELECTROSURGICAL) ×2
ELECTRODE REM PT RTRN 9FT ADLT (ELECTROSURGICAL) ×1 IMPLANT
GAUZE SPONGE 4X4 12PLY STRL (GAUZE/BANDAGES/DRESSINGS) IMPLANT
GLOVE BIO SURGEON STRL SZ 6.5 (GLOVE) ×2 IMPLANT
GLOVE BIOGEL PI IND STRL 7.0 (GLOVE) ×1 IMPLANT
GLOVE BIOGEL PI INDICATOR 7.0 (GLOVE) ×1
GOWN STRL REUS W/TWL 2XL LVL3 (GOWN DISPOSABLE) ×2 IMPLANT
KIT TURNOVER CYSTO (KITS) ×2 IMPLANT
NEEDLE HYPO 22GX1.5 SAFETY (NEEDLE) ×2 IMPLANT
NS IRRIG 500ML POUR BTL (IV SOLUTION) ×2 IMPLANT
PACK BASIN DAY SURGERY FS (CUSTOM PROCEDURE TRAY) ×2 IMPLANT
PAD ABD 8X10 STRL (GAUZE/BANDAGES/DRESSINGS) ×2 IMPLANT
PAD ARMBOARD 7.5X6 YLW CONV (MISCELLANEOUS) IMPLANT
PENCIL BUTTON HOLSTER BLD 10FT (ELECTRODE) ×2 IMPLANT
SPONGE SURGIFOAM ABS GEL 100 (HEMOSTASIS) IMPLANT
SPONGE SURGIFOAM ABS GEL 12-7 (HEMOSTASIS) IMPLANT
SUT CHROMIC 2 0 SH (SUTURE) ×2 IMPLANT
SUT CHROMIC 3 0 SH 27 (SUTURE) ×2 IMPLANT
SUT VIC AB 2-0 SH 27 (SUTURE) ×2
SUT VIC AB 2-0 SH 27XBRD (SUTURE) ×1 IMPLANT
SUT VIC AB 4-0 P-3 18XBRD (SUTURE) IMPLANT
SUT VIC AB 4-0 P3 18 (SUTURE)
SUT VIC AB 4-0 SH 18 (SUTURE) IMPLANT
SYR CONTROL 10ML LL (SYRINGE) ×2 IMPLANT
TRAY DSU PREP LF (CUSTOM PROCEDURE TRAY) ×2 IMPLANT
TUBE CONNECTING 12X1/4 (SUCTIONS) ×2 IMPLANT
WATER STERILE IRR 500ML POUR (IV SOLUTION) IMPLANT
YANKAUER SUCT BULB TIP NO VENT (SUCTIONS) ×2 IMPLANT

## 2018-09-21 NOTE — Transfer of Care (Signed)
Immediate Anesthesia Transfer of Care Note  Patient: Deborah Jordan  Procedure(s) Performed: Procedure(s) (LRB): SINGLE COLUMN HEMORRHOIDECTOMY, HEMORRHOIDPEXY (N/A)  Patient Location: PACU  Anesthesia Type: General  Level of Consciousness: awake, sedated, patient cooperative and responds to stimulation  Airway & Oxygen Therapy: Patient Spontanous Breathing and Patient connected to NCO2  Post-op Assessment: Report given to PACU RN, Post -op Vital signs reviewed and stable and Patient moving all extremities  Post vital signs: Reviewed and stable  Complications: No apparent anesthesia complications

## 2018-09-21 NOTE — Anesthesia Preprocedure Evaluation (Addendum)
Anesthesia Evaluation  Patient identified by MRN, date of birth, ID band Patient awake    Reviewed: Allergy & Precautions, H&P , NPO status , Patient's Chart, lab work & pertinent test results, reviewed documented beta blocker date and time   Airway Mallampati: I  TM Distance: >3 FB Neck ROM: full    Dental  (+) Poor Dentition, Missing   Pulmonary    Pulmonary exam normal breath sounds clear to auscultation       Cardiovascular hypertension, Pt. on medications Normal cardiovascular exam Rhythm:regular Rate:Normal     Neuro/Psych    GI/Hepatic Neg liver ROS, GERD  Medicated and Controlled,  Endo/Other  negative endocrine ROS  Renal/GU   negative genitourinary   Musculoskeletal   Abdominal (+) + obese,   Peds  Hematology negative hematology ROS (+)   Anesthesia Other Findings Deborah Jordan  ECHO COMPLETE WO IMAGING ENHANCING AGENT  Order# [id number]  Reading physician: Josue Hector, MD Ordering physician: [name], MD Study date: 05/04/17 Study Result   Result status: Final result                          [name]                        [address], [place] [address]                            [phone]  ------------------------------------------------------------------- Transthoracic Echocardiography  Patient:    Deborah Jordan, Deborah Jordan MR #:       [id number] Study Date: 05/04/2017 Gender:     F Age:        84 Height:     147.3 cm Weight:     68 kg BSA:        1.7 m^2 Pt. Status: Room:   ATTENDING    Jenkins Rouge, M.D.  SONOGRAPHER  [address]     [name] [address], [place] [address]  PERFORMING   [facility], Outpatient  cc:  ------------------------------------------------------------------- LV EF: 55% -   60%  ------------------------------------------------------------------- Indications:      Right  bundle branch block - I45.10.  ------------------------------------------------------------------- History:   PMH:   Dyspnea.  Risk factors:  Hypertension.  ------------------------------------------------------------------- Study Conclusions  - Left ventricle: The cavity size was normal. Wall thickness was   increased in a pattern of mild LVH. Systolic function was normal.   The estimated ejection fraction was in the range of 55% to 60%.   Wall motion was normal; there were no regional wall motion   abnormalities. Doppler parameters are consistent with abnormal   left ventricular relaxation (grade 1 diastolic dysfunction). - Atrial septum: No defect or patent foramen ovale was identified.  ------------------------------------------------------------------- Study data:   Study status:  Routine.  Procedure:  Transthoracic echocardiography. Image quality was adequate.  Study completion: There were no     Reproductive/Obstetrics negative OB ROS                            Anesthesia Physical  Anesthesia Plan  ASA: II  Anesthesia Plan: General   Post-op Pain Management:    Induction: Intravenous  PONV Risk Score and Plan:   Airway Management Planned: Oral ETT  Additional Equipment:   Intra-op Plan:   Post-operative Plan: Extubation in OR  Informed Consent: I have reviewed the patients History and Physical, chart, labs and discussed the procedure including the risks, benefits and alternatives for the proposed anesthesia with the patient or authorized representative who has indicated his/her understanding and acceptance.   Dental advisory given  Plan Discussed with: CRNA and Surgeon  Anesthesia Plan Comments:         Anesthesia Quick Evaluation

## 2018-09-21 NOTE — Anesthesia Procedure Notes (Signed)
Procedure Name: Intubation Date/Time: 09/21/2018 1:03 PM Performed by: Justice Rocher, CRNA Pre-anesthesia Checklist: Patient identified, Emergency Drugs available, Suction available and Patient being monitored Patient Re-evaluated:Patient Re-evaluated prior to induction Oxygen Delivery Method: Circle system utilized Preoxygenation: Pre-oxygenation with 100% oxygen Induction Type: IV induction Ventilation: Mask ventilation without difficulty Laryngoscope Size: Mac and 3 Grade View: Grade II Tube type: Oral Tube size: 7.0 mm Number of attempts: 1 Airway Equipment and Method: Stylet and Oral airway Placement Confirmation: ETT inserted through vocal cords under direct vision,  positive ETCO2 and breath sounds checked- equal and bilateral Secured at: 23 cm Tube secured with: Tape Dental Injury: Teeth and Oropharynx as per pre-operative assessment

## 2018-09-21 NOTE — Discharge Instructions (Addendum)
ANORECTAL SURGERY: POST OP INSTRUCTIONS °1. Take your usually prescribed home medications unless otherwise directed. °2. DIET: During the first few hours after surgery sip on some liquids until you are able to urinate.  It is normal to not urinate for several hours after this surgery.  If you feel uncomfortable, please contact the office for instructions.  After you are able to urinate,you may eat, if you feel like it.  Follow a light bland diet the first 24 hours after arrival home, such as soup, liquids, crackers, etc.  Be sure to include lots of fluids daily (6-8 glasses).  Avoid fast food or heavy meals, as your are more likely to get nauseated.  Eat a low fat diet the next few days after surgery.  Limit caffeine intake to 1-2 servings a day. °3. PAIN CONTROL: °a. Pain is best controlled by a usual combination of several different methods TOGETHER: °i. Muscle relaxation: Soak in a warm bath (or Sitz bath) three times a day and after bowel movements.  Continue to do this until all pain is resolved. °ii. Over the counter pain medication °iii. Prescription pain medication °b. Most patients will experience some swelling and discomfort in the anus/rectal area and incisions.  Heat such as warm towels, sitz baths, warm baths, etc to help relax tight/sore spots and speed recovery.  Some people prefer to use ice, especially in the first couple days after surgery, as it may decrease the pain and swelling, or alternate between ice & heat.  Experiment to what works for you.  Swelling and bruising can take several weeks to resolve.  Pain can take even longer to completely resolve. °c. It is helpful to take an over-the-counter pain medication regularly for the first few weeks.  Choose one of the following that works best for you: °i. Naproxen (Aleve, etc)  Two 220mg tabs twice a day °ii. Ibuprofen (Advil, etc) Three 200mg tabs four times a day (every meal & bedtime) °d. A  prescription for pain medication (such as percocet,  oxycodone, hydrocodone, etc) should be given to you upon discharge.  Take your pain medication as prescribed.  °i. If you are having problems/concerns with the prescription medicine (does not control pain, nausea, vomiting, rash, itching, etc), please call us [phone] to see if we need to switch you to a different pain medicine that will work better for you and/or control your side effect better. °ii. If you need a refill on your pain medication, please contact your pharmacy.  They will contact our office to request authorization. Prescriptions will not be filled after 5 pm or on week-ends. °4. KEEP YOUR BOWELS REGULAR and AVOID CONSTIPATION °a. The goal is one to two soft bowel movements a day.  You should at least have a bowel movement every other day. °b. Avoid getting constipated.  Between the surgery and the pain medications, it is common to experience some constipation. This can be very painful after rectal surgery.  Increasing fluid intake and taking a fiber supplement (such as Metamucil, Citrucel, FiberCon, etc) 1-2 times a day regularly will usually help prevent this problem from occurring.  A stool softener like colace is also recommended.  This can be purchased over the counter at your pharmacy.  You can take it up to 3 times a day.  If you do not have a bowel movement after 24 hrs since your surgery, take one does of milk of magnesia.  If you still haven't had a bowel movement 8-12 hours after   that dose, take another dose.  If you don't have a bowel movement 48 hrs after surgery, purchase a Fleets enema from the drug store and administer gently per package instructions.  If you still are having trouble with your bowel movements after that, please call the office for further instructions. °c. If you develop diarrhea or have many loose bowel movements, simplify your diet to bland foods & liquids for a few days.  Stop any stool softeners and decrease your fiber supplement.  Switching to mild  anti-diarrheal medications (Kayopectate, Pepto Bismol) can help.  If this worsens or does not improve, please call us. ° °5. Wound Care °a. Remove your bandages before your first bowel movement or 8 hours after surgery.     °b. Remove any wound packing material at this tim,e as well.  You do not need to repack the wound unless instructed otherwise.  Wear an absorbent pad or soft cotton gauze in your underwear to catch any drainage and help keep the area clean. You should change this every 2-3 hours while awake. °c. Keep the area clean and dry.  Bathe / shower every day, especially after bowel movements.  Keep the area clean by showering / bathing over the incision / wound.   It is okay to soak an open wound to help wash it.  Wet wipes or showers / gentle washing after bowel movements is often less traumatic than regular toilet paper. °d. You may have some styrofoam-like soft packing in the rectum which will come out with the first bowel movement.  °e. You will often notice bleeding with bowel movements.  This should slow down by the end of the first week of surgery °f. Expect some drainage.  This should slow down, too, by the end of the first week of surgery.  Wear an absorbent pad or soft cotton gauze in your underwear until the drainage stops. °g. Do Not sit on a rubber or pillow ring.  This can make you symptoms worse.  You may sit on a soft pillow if needed.  °6. ACTIVITIES as tolerated:   °a. You may resume regular (light) daily activities beginning the next day--such as daily self-care, walking, climbing stairs--gradually increasing activities as tolerated.  If you can walk 30 minutes without difficulty, it is safe to try more intense activity such as jogging, treadmill, bicycling, low-impact aerobics, swimming, etc. °b. Save the most intensive and strenuous activity for last such as sit-ups, heavy lifting, contact sports, etc  Refrain from any heavy lifting or straining until you are off narcotics for pain  control.   °c. You may drive when you are no longer taking prescription pain medication, you can comfortably sit for long periods of time, and you can safely maneuver your car and apply brakes. °d. You may have sexual intercourse when it is comfortable.  °7. FOLLOW UP in our office °a. Please call [facility] at [phone] to set up an appointment to see your surgeon in the office for a follow-up appointment approximately 3-4 weeks after your surgery. °b. Make sure that you call for this appointment the day you arrive home to insure a convenient appointment time. °10. IF YOU HAVE DISABILITY OR FAMILY LEAVE FORMS, BRING THEM TO THE OFFICE FOR PROCESSING.  DO NOT GIVE THEM TO YOUR DOCTOR. ° ° ° ° °WHEN TO CALL US [phone]: °1. Poor pain control °2. Reactions / problems with new medications (rash/itching, nausea, etc)  °3. Fever over 101.5 F (38.5 C) °4.   Inability to urinate 5. Nausea and/or vomiting 6. Worsening swelling or bruising 7. Continued bleeding from incision. 8. Increased pain, redness, or drainage from the incision  The clinic staff is available to answer your questions during regular business hours (8:30am-5pm).  Please dont hesitate to call and ask to speak to one of our nurses for clinical concerns.   A surgeon from [facility] is always on call at the hospitals   If you have a medical emergency, go to the nearest emergency room or call 911.   Information for Discharge Teaching: EXPAREL (bupivacaine liposome injectable suspension)   Your surgeon gave you EXPAREL(bupivacaine) in your surgical incision to help control your pain after surgery.   EXPAREL is a local anesthetic that provides pain relief by numbing the tissue around the surgical site.  EXPAREL is designed to release pain medication over time and can control pain for up to 72 hours.  Depending on how you respond to EXPAREL, you may require less pain medication during your recovery.  Possible side  effects:  Temporary loss of sensation or ability to move in the area where bupivacaine was injected.  Nausea, vomiting, constipation  Rarely, numbness and tingling in your mouth or lips, lightheadedness, or anxiety may occur.  Call your doctor right away if you think you may be experiencing any of these sensations, or if you have other questions regarding possible side effects.  Follow all other discharge instructions given to you by your surgeon or nurse. Eat a healthy diet and drink plenty of water or other fluids.  If you return to the hospital for any reason within 96 hours following the administration of EXPAREL, please inform your health care providers.    [facility], [address], [place], [place], [place]  [address] ? MAIN: [phone] ? TOLL FREE: [phone] ? FAX [phone] [email or web address]   NO ADVIL, ALEVE, MOTRIN, IBUPROFEN UNTIL 730 PM   Post Anesthesia Home Care Instructions  Activity: Get plenty of rest for the remainder of the day. A responsible adult should stay with you for 24 hours following the procedure.  For the next 24 hours, DO NOT: -Drive a car -Paediatric nurse -Drink alcoholic beverages -Take any medication unless instructed by your physician -Make any legal decisions or sign important papers.  Meals: Start with liquid foods such as gelatin or soup. Progress to regular foods as tolerated. Avoid greasy, spicy, heavy foods. If nausea and/or vomiting occur, drink only clear liquids until the nausea and/or vomiting subsides. Call your physician if vomiting continues.  Special Instructions/Symptoms: Your throat may feel dry or sore from the anesthesia or the breathing tube placed in your throat during surgery. If this causes discomfort, gargle with warm salt water. The discomfort should disappear within 24 hours.  If you had a scopolamine patch placed behind your ear for the management of post-  operative nausea and/or vomiting:  1. The medication in the patch is effective for 72 hours, after which it should be removed.  Wrap patch in a tissue and discard in the trash. Wash hands thoroughly with soap and water. 2. You may remove the patch earlier than 72 hours if you experience unpleasant side effects which may include dry mouth, dizziness or visual disturbances. 3. Avoid touching the patch. Wash your hands with soap and water after contact with the patch.

## 2018-09-21 NOTE — Interval H&P Note (Signed)
History and Physical Interval Note:  09/21/2018 12:03 PM  Deborah Jordan  has presented today for surgery, with the diagnosis of GRADE 3 INTERNAL HEMS  The various methods of treatment have been discussed with the patient and family. After consideration of risks, benefits and other options for treatment, the patient has consented to  Procedure(s): SINGLE COLUMN HEMORRHOIDECTOMY (N/A) as a surgical intervention .  The patient's history has been reviewed, patient examined, no change in status, stable for surgery.  I have reviewed the patient's chart and labs.  Questions were answered to the patient's satisfaction.     Rosario Adie, MD  [facility]

## 2018-09-21 NOTE — Op Note (Addendum)
09/21/2018  1:27 PM  PATIENT:  Deborah Jordan  84 y.o. female  Patient Care Team: [name], MD as PCP - General (Family Medicine)  PRE-OPERATIVE DIAGNOSIS:  GRADE 3 INTERNAL HEMS  POST-OPERATIVE DIAGNOSIS:  GRADE 3 INTERNAL HEMS  PROCEDURE:  SINGLE COLUMN HEMORRHOIDECTOMY, HEMORRHOIDPEXY   Surgeon(s): Leighton Ruff, MD  ASSISTANT: none   ANESTHESIA:   local and general  SPECIMEN:  Source of Specimen:  R anterior hemorrhoid  DISPOSITION OF SPECIMEN:  PATHOLOGY  COUNTS:  YES  PLAN OF CARE: Discharge to home after PACU  PATIENT DISPOSITION:  PACU - hemodynamically stable.  INDICATION: 84 y.o. F with grade 3 hemorrhoid and polypoid mass   OR FINDINGS: R anterior grade 3 internal hemorrhoid.  Grade 2 R posterior hemorrhoid  DESCRIPTION: the patient was identified in the preoperative holding area and taken to the OR where they were laid on the operating room table.  General anesthesia was induced without difficulty. The patient was then positioned in prone jackknife position with buttocks gently taped apart.  The patient was then prepped and draped in usual sterile fashion.  SCDs were noted to be in place prior to the initiation of anesthesia. A surgical timeout was performed indicating the correct patient, procedure, positioning and need for preoperative antibiotics.  A rectal block was performed using Marcaine with epinephrine mixed with Experel.    I began with a digital rectal exam.  Sphincter tone was decreased.  I then placed a Hill-Ferguson anoscope into the anal canal and evaluated this completely.  The patient had a grade 3 right anterior internal hemorrhoid and a grade 2 right posterior internal hemorrhoid.  I began with the right anterior hemorrhoid.  This was elevated with an Allis clamp and incised with 10 blade scalpel.  Dissection was carried down to the level of the sphincter complex and this was bluntly separated from the hemorrhoid column.  Metzenbaum scissors  were used to transect the hemorrhoid tissue away from the anoderm.  This was sent to pathology as right anterior hemorrhoid.  The internal incision was closed using a running 2-0 chromic suture.  The external portion was closed with a running 3-0 chromic suture.  I decided to perform a hemorrhoidal pexy of the right posterior hemorrhoid to prevent further prolapse in the future.  This was done using a 2-0 Vicryl suture.  Additional Marcaine was placed underneath the incision site.  The patient tolerated this well was awakened from anesthesia and sent to postanesthesia care unit stable condition.  All counts were correct per operating room staff.  I have reviewed the [place] for this patient.

## 2018-09-24 ENCOUNTER — Encounter (HOSPITAL_BASED_OUTPATIENT_CLINIC_OR_DEPARTMENT_OTHER): Payer: Self-pay | Admitting: General Surgery

## 2018-09-28 NOTE — Anesthesia Postprocedure Evaluation (Signed)
Anesthesia Post Note  Patient: Deborah Jordan  Procedure(s) Performed: SINGLE COLUMN HEMORRHOIDECTOMY, HEMORRHOIDPEXY (N/A Anus)     Patient location during evaluation: PACU Anesthesia Type: General Level of consciousness: awake Pain management: pain level controlled Vital Signs Assessment: post-procedure vital signs reviewed and stable Respiratory status: spontaneous breathing Cardiovascular status: stable Postop Assessment: no apparent nausea or vomiting Anesthetic complications: no    Last Vitals:  Vitals:   09/21/18 1515 09/21/18 1745  BP:  128/68  Pulse: 71 66  Resp: 15 16  Temp:  36.5 C  SpO2: 92% 94%    Last Pain:  Vitals:   09/24/18 0954  TempSrc:   PainSc: 0-No pain   Pain Goal: Patients Stated Pain Goal: 5 (09/21/18 1224)               [place]

## 2019-11-06 ENCOUNTER — Other Ambulatory Visit: Payer: Self-pay

## 2019-11-06 ENCOUNTER — Ambulatory Visit: Payer: Self-pay

## 2019-11-06 ENCOUNTER — Encounter: Payer: Self-pay | Admitting: Physician Assistant

## 2019-11-06 ENCOUNTER — Ambulatory Visit: Payer: Medicare Other | Admitting: Physician Assistant

## 2019-11-06 DIAGNOSIS — M1711 Unilateral primary osteoarthritis, right knee: Secondary | ICD-10-CM

## 2019-11-06 DIAGNOSIS — M898X1 Other specified disorders of bone, shoulder: Secondary | ICD-10-CM

## 2019-11-06 DIAGNOSIS — M7062 Trochanteric bursitis, left hip: Secondary | ICD-10-CM

## 2019-11-06 MED ORDER — METHYLPREDNISOLONE ACETATE 40 MG/ML IJ SUSP
40.0000 mg | INTRAMUSCULAR | Status: AC | PRN
  Administered 2019-11-06: 40 mg via INTRA_ARTICULAR

## 2019-11-06 MED ORDER — LIDOCAINE HCL 1 % IJ SOLN
3.0000 mL | INTRAMUSCULAR | Status: AC | PRN
  Administered 2019-11-06: 3 mL

## 2019-11-06 NOTE — Progress Notes (Signed)
Office Visit Note   Patient: Deborah Jordan           Date of Birth: Aug 06, 1933           MRN: [id number] Visit Date: 11/06/2019              Requested by: Deborah Jordan, MD [address] [place],  [place] [address] PCP: Deborah Jordan, MD   Assessment & Plan: Visit Diagnoses:  1. Chronic pain of right knee   2. Trochanteric bursitis of left hip   3. Pain of left clavicle     Plan:  Due to the fact that she continues to have low back pain With radicular symptoms down the left leg there was similar to what she had back in 2018 recommend repeat ESI's possibly bilaterally L4 transforaminal as she did have good results with this.  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  She understands that she has bone-on-bone arthritis involving the medial compartment.  Did discuss the films of her sternoclavicular joint today and told her that peers that she has arthritic changes within the sternoclavicular joint.  As this does not bother her I would just watch this for the time being.  No recommendations otherwise.  Follow-up with [place] on as-needed basis.  Questions encouraged and answered at length Follow-Up Instructions: No follow-ups on file.   Orders:  Orders Placed This Encounter  Procedures   Large Joint Inj   Large Joint Inj   XR KNEE 3 VIEW RIGHT   XR HIP UNILAT W OR W/O PELVIS 2-3 VIEWS LEFT   XR Clavicle Left   No orders of the defined types were placed in this encounter.     Procedures: Large Joint Inj: R knee on 11/06/2019 5:07 PM Indications: pain Details: 22 G 1.5 in needle, anterolateral approach  Arthrogram: No  Medications: 3 mL lidocaine 1 %; 40 mg methylPREDNISolone acetate 40 MG/ML Outcome: tolerated well, no immediate complications Procedure, treatment alternatives, risks and benefits explained, specific risks discussed. Consent was given by the patient. Immediately prior to procedure a time out was called to verify  the correct patient, procedure, equipment, support staff and site/side marked as required. Patient was prepped and draped in the usual sterile fashion.   Large Joint Inj: L greater trochanter on 11/06/2019 5:08 PM Indications: pain Details: 22 G 1.5 in needle, lateral approach  Arthrogram: No  Medications: 3 mL lidocaine 1 %; 40 mg methylPREDNISolone acetate 40 MG/ML Outcome: tolerated well, no immediate complications Procedure, treatment alternatives, risks and benefits explained, specific risks discussed. Consent was given by the patient. Immediately prior to procedure a time out was called to verify the correct patient, procedure, equipment, support staff and site/side marked as required. Patient was prepped and draped in the usual sterile fashion.       Clinical Data: No additional findings.   Subjective: No chief complaint on file.   HPI Deborah Jordan comes in today with right knee pain hip pain a "knot over the front of her neck and low back pain with occasional numbness tingling down the left leg.  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  She states pain in the back and down her left leg is similar to what she had back in 2018.  She states the pain radiates from her hips down to her ankle.  She denies any bowel bladder changes.  Denies any saddle anesthesia like symptoms.  Pain does awaken her.  She is unsure if this is her left hip her low back pain is awakening her.  She had no new injury.  History of left total hip arthroplasty no groin pain.  Pain lateral aspect of the hip.  No new injury. She is also having right knee pain mostly medial aspect of the knee.  No new injury.  She is taken some Tylenol which gives her good relief from the knee pain. Also she has a knot on the front of her neck would like this looked at today.  She just noticed in the shower a few weeks ago.  She has had no new injury.  No pain in this area. Review of  Systems Please see HPI.  Otherwise negative for fevers chills shortness of breath chest pain  Objective: Vital Signs: There were no vitals taken for this visit.  Physical Exam Constitutional:      Appearance: She is not ill-appearing or diaphoretic.  Pulmonary:     Effort: Pulmonary effort is normal.  Neurological:     Mental Status: She is alert and oriented to person, place, and time.  Psychiatric:        Mood and Affect: Mood normal.     Ortho Exam  Prominence over her left sternoclavicular joint.  There is no abnormal warmth no erythema in this area.  Nontender.  Right sternoclavicular joint still without any prominence. Bilateral knees good range of motion without pain.  No significant crepitus.  No instability valgus varus stressing.  She has tenderness along medial joint line of both knees.  No effusion or abnormal warmth of either knee. Bilateral hips left hip good range of motion.  She has tenderness over the left greater trochanteric region.  Right hip overall good range of motion without significant pain. Lower extremities 5 out of 5 strength throughout the lower extremities against resistance negative straight leg raise bilaterally.  Specialty Comments:  No specialty comments available.  Imaging: Xr Clavicle Left  Result Date: 11/06/2019 Left clavicle: No acute fracture.  Arthritic changes involving the sternoclavicular joint.  No bony destruction.  Xr Hip Unilat W Or W/o Pelvis 2-3 Views Left  Result Date: 11/06/2019 AP pelvis lateral view of the left hip: Status post left total hip arthroplasty.  No evidence of loosening or hardware failure.  No acute fractures.  Both hips well located.  Severe end-stage arthritis of the right hip.  Xr Knee 3 View Right  Result Date: 11/06/2019 Right knee AP, sunrise and lateral views: Bone-on-bone medial compartment.  Lateral compartment is well-preserved.  Moderate patellofemoral changes.  No acute fractures.    PMFS  History: Patient Active Problem List   Diagnosis Date Noted   Chronic bilateral low back pain with bilateral sciatica 09/27/2017   Bilateral chronic knee pain 03/13/2017   Pleural effusion 03/10/2013   Hyponatremia 03/10/2013   Hypertension 03/10/2013   Degenerative arthritis of hip 03/08/2013   Past Medical History:  Diagnosis Date   Anxiety    Arthritis    osteoarthritis. spinal stenosis. Scoliosis of spine-degenerative spine.   Bilateral cataracts    Chronic kidney disease    STAGE 4   DDD (degenerative disc disease), lumbar    Dyspnea    GERD (gastroesophageal reflux disease)    controls with Nexium   Grade I diastolic dysfunction 29/52/8413   Noted on ECHO   History of cardiomegaly    History of gallstones    Hypertension    LVH (left ventricular hypertrophy)  05/04/2017   Mil, noted on ECHO   Pre-diabetes    RBBB (right bundle branch block)    Spinal stenosis    Wears partial dentures     No family history on file.  Past Surgical History:  Procedure Laterality Date   CATARACT EXTRACTION Bilateral 03/06/2013   CHOLECYSTECTOMY     COLONOSCOPY     CRYOTHERAPY     DILATION AND CURETTAGE OF UTERUS     HEMORRHOID SURGERY N/A 09/21/2018   Procedure: SINGLE COLUMN HEMORRHOIDECTOMY, HEMORRHOIDPEXY;  Surgeon: Leighton Ruff, MD;  Location: [facility];  Service: General;  Laterality: N/A;   TOTAL HIP ARTHROPLASTY Left 03/08/2013   Procedure: LEFT TOTAL HIP ARTHROPLASTY ANTERIOR APPROACH;  Surgeon: Mcarthur Rossetti, MD;  Location: [facility];  Service: Orthopedics;  Laterality: Left;   Social History   Occupational History   Not on file  Tobacco Use   Smoking status: Never Smoker   Smokeless tobacco: Never Used  Substance and Sexual Activity   Alcohol use: Yes    Comment: [facility]   Drug use: Never   Sexual activity: Not Currently    Birth control/protection: Post-menopausal

## 2019-11-07 ENCOUNTER — Other Ambulatory Visit: Payer: Self-pay | Admitting: Radiology

## 2019-11-07 DIAGNOSIS — G8929 Other chronic pain: Secondary | ICD-10-CM

## 2019-11-19 ENCOUNTER — Other Ambulatory Visit: Payer: Self-pay

## 2019-11-19 ENCOUNTER — Ambulatory Visit
Admission: RE | Admit: 2019-11-19 | Discharge: 2019-11-19 | Disposition: A | Discharge status: Home / Self Care | Payer: Medicare Other | Source: Ambulatory Visit | Attending: Family Medicine | Admitting: Family Medicine

## 2019-11-19 ENCOUNTER — Other Ambulatory Visit: Payer: Self-pay | Admitting: Family Medicine

## 2019-11-19 DIAGNOSIS — W19XXXA Unspecified fall, initial encounter: Secondary | ICD-10-CM

## 2019-11-29 ENCOUNTER — Ambulatory Visit: Payer: Self-pay | Admitting: Cardiology

## 2019-12-23 ENCOUNTER — Ambulatory Visit: Payer: Self-pay | Admitting: Cardiology

## 2019-12-25 ENCOUNTER — Telehealth: Payer: Self-pay | Admitting: Cardiology

## 2019-12-25 NOTE — Telephone Encounter (Signed)
12/25/2019 patient called to reschedule appointment she had cancelled for 12/23/2019. In our system there are 2 other appointments for the patient that were also cancelled. Advised patient that this would be the last time I can r/s her appointment has she had cancelled several times before. She is unhappy and stated this is not true she only cancelled the appointment for 1/4 and isn't aware of the other appointments. Asked I make a note of her being unaware.

## 2020-01-01 ENCOUNTER — Ambulatory Visit: Payer: Self-pay | Admitting: Cardiology

## 2020-01-17 ENCOUNTER — Encounter: Payer: Self-pay | Admitting: Cardiology

## 2020-01-17 ENCOUNTER — Other Ambulatory Visit: Payer: Self-pay

## 2020-01-17 ENCOUNTER — Ambulatory Visit: Payer: Medicare Other | Admitting: Cardiology

## 2020-01-17 VITALS — BP 131/64 | HR 87 | Temp 98.1°F | Ht <= 58 in | Wt 177.0 lb

## 2020-01-17 DIAGNOSIS — I4510 Unspecified right bundle-branch block: Secondary | ICD-10-CM

## 2020-01-17 DIAGNOSIS — I444 Left anterior fascicular block: Secondary | ICD-10-CM | POA: Diagnosis not present

## 2020-01-17 DIAGNOSIS — R002 Palpitations: Secondary | ICD-10-CM | POA: Diagnosis not present

## 2020-01-17 DIAGNOSIS — I10 Essential (primary) hypertension: Secondary | ICD-10-CM

## 2020-01-17 DIAGNOSIS — E7800 Pure hypercholesterolemia, unspecified: Secondary | ICD-10-CM

## 2020-01-17 NOTE — Progress Notes (Signed)
Primary Physician/Referring:  [name], MD  Patient ID: Deborah Jordan, female    DOB: 05-04-1933, 86 y.o.   MRN: [id number]  Chief Complaint  Patient presents with  . Irregular Heart Beat  . Follow-up   HPI:    Deborah Jordan  is a 86 y.o. with hypertension, hyperlipidemia, right bundle branch block on EKG, presents for follow-up for complaints of irregular heartbeat.  She was last seen in January 2018 and advised PRN follow up. She had an episode of chest pain about 4-5 months ago and was advised to see, chest pain started after a fall, and also had palpitations. No syncope. No further episodes of chest pain and states palpitations have subsided and lasted a few seconds.  Past Medical History:  Diagnosis Date  . Anxiety   . Arthritis    osteoarthritis. spinal stenosis. Scoliosis of spine-degenerative spine.  . Bilateral cataracts   . Chronic kidney disease    STAGE 4  . DDD (degenerative disc disease), lumbar   . Dyspnea   . GERD (gastroesophageal reflux disease)    controls with Nexium  . Grade I diastolic dysfunction 02/40/9735   Noted on ECHO  . History of cardiomegaly   . History of gallstones   . Hypertension   . LVH (left ventricular hypertrophy) 05/04/2017   Mil, noted on ECHO  . Pre-diabetes   . RBBB (right bundle branch block)   . Spinal stenosis   . Wears partial dentures    Past Surgical History:  Procedure Laterality Date  . CATARACT EXTRACTION Bilateral 03/06/2013  . CHOLECYSTECTOMY    . COLONOSCOPY    . CRYOTHERAPY    . DILATION AND CURETTAGE OF UTERUS    . HEMORRHOID SURGERY N/A 09/21/2018   Procedure: SINGLE COLUMN HEMORRHOIDECTOMY, HEMORRHOIDPEXY;  Surgeon: Leighton Ruff, MD;  Location: [facility];  Service: General;  Laterality: N/A;  . TOTAL HIP ARTHROPLASTY Left 03/08/2013   Procedure: LEFT TOTAL HIP ARTHROPLASTY ANTERIOR APPROACH;  Surgeon: Mcarthur Rossetti, MD;  Location: [facility];  Service: Orthopedics;  Laterality:  Left;   Social History   Tobacco Use  . Smoking status: Never Smoker  . Smokeless tobacco: Never Used  Substance Use Topics  . Alcohol use: Yes    Comment: OCC    ROS  Review of Systems  Constitution: Negative for decreased appetite, malaise/fatigue, weight gain and weight loss.  Eyes: Negative for visual disturbance.  Cardiovascular: Negative for chest pain, claudication, dyspnea on exertion, leg swelling, orthopnea, palpitations and syncope.  Respiratory: Negative for hemoptysis and wheezing.   Endocrine: Negative for cold intolerance and heat intolerance.  Hematologic/Lymphatic: Does not bruise/bleed easily.  Skin: Negative for nail changes.  Musculoskeletal: Negative for muscle weakness and myalgias.  Gastrointestinal: Positive for diarrhea. Negative for abdominal pain, change in bowel habit, nausea and vomiting.  Neurological: Negative for difficulty with concentration, dizziness, focal weakness and headaches.  Psychiatric/Behavioral: Negative for altered mental status and suicidal ideas.  All other systems reviewed and are negative.  Objective  Blood pressure 131/64, pulse 87, temperature 98.1 F (36.7 C), height 4\' 10"  (1.473 m), weight 177 lb (80.3 kg), SpO2 98 %.  Vitals with BMI 01/17/2020 09/21/2018 09/21/2018  Height 4\' 10"  - -  Weight 177 lbs - -  BMI 37 - -  Systolic 329 924 -  Diastolic 64 68 -  Pulse 87 66 71     Physical Exam  Constitutional: Vital signs are normal.  Short stature and moderately obese  Cardiovascular: Normal rate, regular rhythm and intact distal pulses.  Murmur heard.  Early systolic murmur is present with a grade of 2/6 radiating to the apex. Pulses:      Carotid pulses are on the right side with bruit. Pulmonary/Chest: Effort normal and breath sounds normal. No accessory muscle usage. No respiratory distress.  Abdominal: Soft. Bowel sounds are normal.  Musculoskeletal:        General: Normal range of motion.   Laboratory  examination:   No results for input(s): NA, K, CL, CO2, GLUCOSE, BUN, CREATININE, CALCIUM, GFRNONAA, GFRAA in the last 8760 hours. CrCl cannot be calculated (Patient's most recent lab result is older than the maximum 21 days allowed.).  CMP Latest Ref Rng & Units 09/21/2018 03/11/2013 03/10/2013  Glucose 70 - 99 mg/dL 100(H) 118(H) 148(H)  BUN 6 - 23 mg/dL - 15 16  Creatinine 0.50 - 1.10 mg/dL - 0.90 0.84  Sodium 135 - 145 mmol/L 138 126(L) 125(L)  Potassium 3.5 - 5.1 mmol/L 4.3 4.0 4.1  Chloride 96 - 112 mEq/L - 91(L) 90(L)  CO2 19 - 32 mEq/L - 28 28  Calcium 8.4 - 10.5 mg/dL - 8.8 8.5   CBC Latest Ref Rng & Units 09/21/2018 03/11/2013 03/10/2013  WBC 4.0 - 10.5 K/uL - 11.4(H) 12.1(H)  Hemoglobin 12.0 - 15.0 g/dL 12.9 9.3(L) 9.6(L)  Hematocrit 36.0 - 46.0 % 38.0 27.3(L) 28.5(L)  Platelets 150 - 400 K/uL - 284 308   Lipid Panel  No results found for: CHOL, TRIG, HDL, CHOLHDL, VLDL, LDLCALC, LDLDIRECT HEMOGLOBIN A1C No results found for: HGBA1C, MPG TSH No results for input(s): TSH in the last 8760 hours.  External labs  11/05/2019 :   Cholesterol, total 145.000 11/05/2019 HDL 53.000 11/05/2019 LDL 75.000 11/05/2019 Triglycerides 87.000 11/05/2019 A1C 6.100 11/05/2019 Hemoglobin 12.600 11/05/2019 Creatinine, Serum 1.050 11/05/2019 Potassium 4.800 11/05/2019 Magnesium N/D ALT (SGPT) 10.000 11/05/2019 TSH 2.240 11/05/2019  Medications and allergies  No Known Allergies   Current Outpatient Medications  Medication Instructions  . acetaminophen (TYLENOL) 650 mg, Oral, Every 8 hours PRN  . ALPRAZolam (XANAX) 0.25 mg, Oral, 3 times daily PRN  . amLODipine (NORVASC) 10 mg, Oral, Daily  . atorvastatin (LIPITOR) 10 mg, Oral, Daily  . carvedilol (COREG) 6.25 mg, Oral, 2 times daily with meals  . esomeprazole (NEXIUM) 40 mg, Oral, Daily  . losartan (COZAAR) 100 mg, Oral, Daily  . Vitamin D 1,000 Units, Oral, Daily    Radiology:  No results found.  Cardiac Studies:   Carotid  Doppler 07/12/2016: Minimal stenosis in the bilateral internal carotid artery (minimal) with mild mixed plaque. Antegrade vertebral artery flow. Compared to 07/02/15, right ICA stenosis of < 49% no longer evident. Further studies if clinically indicated.  Assessment     ICD-10-CM   1. Palpitations  R00.2 EKG 12-Lead  2. RBBB  I45.10   3. LAFB (left anterior fascicular block)  I44.4   4. Primary hypertension  I10   5. Hypercholesteremia  E78.00     EKG 01/17/2020: Normal sinus rhythm at the rate of 85 bpm, left axis deviation, left anterior fascicular block.  Right bundle branch block.  Bifascicular block.  LVH.  Poor R wave progression, cannot exclude anterolateral infarct old.   Compared to 2017 EKG, left axis deviation and LVH is new.  Previously low voltage complexes.  No orders of the defined types were placed in this encounter.   Medications Discontinued During This Encounter  Medication Reason  . diazepam (VALIUM) 5 MG tablet  Error  . aspirin EC 325 MG EC tablet Error  . azithromycin (ZITHROMAX Z-PAK) 250 MG tablet Error  . levofloxacin (LEVAQUIN) 500 MG tablet Error  . oxyCODONE (OXY IR/ROXICODONE) 5 MG immediate release tablet Error    Recommendations:   NOHEA KRAS  is a 86 y.o. with hypertension, hyperlipidemia, right bundle branch block on EKG, presents for follow-up for complaints of irregular heartbeat.  She was last seen in January 2018 and advised PRN follow up. She had an episode of chest pain about 4-5 months ago and was advised to see me, she has not had recurrence and now essentially remains asymptomatic.  No change in weight, lipids and BP controlled, suspect palpitaions are related to PVCs and as she is asymptomatic, no further evaluation indicated. Due to abnormal EKG, will see her back in 1 year. Weight loss discussed.   Adrian Prows, MD, [facility] 01/19/2020, 7:40 AM [facility] [place] Office: [phone]

## 2020-03-19 ENCOUNTER — Ambulatory Visit: Payer: Medicare Other | Attending: Family

## 2020-03-19 DIAGNOSIS — Z23 Encounter for immunization: Secondary | ICD-10-CM

## 2020-03-19 NOTE — Progress Notes (Signed)
   Covid-19 Vaccination Clinic  Name:  AVIA MERKLEY    MRN: [id number] DOB: 10/15/1933  03/19/2020  Ms. Ricci was observed post Covid-19 immunization for 15 minutes without incident. She was provided with Vaccine Information Sheet and instruction to access the V-Safe system.   Ms. Myung was instructed to call 911 with any severe reactions post vaccine: Marland Kitchen Difficulty breathing  . Swelling of face and throat  . A fast heartbeat  . A bad rash all over body  . Dizziness and weakness   Immunizations Administered    Name Date Dose VIS Date Route   Moderna COVID-19 Vaccine 03/19/2020  3:34 PM 0.5 mL 11/19/2019 Intramuscular   Manufacturer: Moderna   Lot: [id number]   [place]: [id number]

## 2020-04-21 ENCOUNTER — Ambulatory Visit: Payer: Medicare Other | Attending: Internal Medicine

## 2020-08-26 ENCOUNTER — Ambulatory Visit: Payer: Medicare Other | Admitting: Orthopaedic Surgery

## 2020-08-31 ENCOUNTER — Telehealth: Payer: Self-pay | Admitting: Physical Medicine and Rehabilitation

## 2020-08-31 NOTE — Telephone Encounter (Signed)
Patient called needing to schedule an appointment with Dr Ernestina Patches for her lower back. The number to contact patient is [phone]

## 2020-09-01 NOTE — Telephone Encounter (Signed)
Bilat L4 tf esi for severe stenosis, 30 min

## 2020-09-01 NOTE — Telephone Encounter (Signed)
Scheduled for 9/27 with driver. 

## 2020-09-01 NOTE — Telephone Encounter (Signed)
Patient had bilateral L3-4 and L4-5 facet injections in 2018. She was referred for an L4 TF in November 2020, but she had to cancel this appointment due to a fall. She states that it is hard to tell where she is hurting, but she hurts on both sides of low back. She states that she has some left leg pain which she has had since her hip replacement. She also reports some swelling in her leg. Please advise.

## 2020-09-14 ENCOUNTER — Ambulatory Visit: Payer: Self-pay

## 2020-09-14 ENCOUNTER — Other Ambulatory Visit: Payer: Self-pay

## 2020-09-14 ENCOUNTER — Encounter: Payer: Self-pay | Admitting: Physical Medicine and Rehabilitation

## 2020-09-14 ENCOUNTER — Ambulatory Visit: Payer: Medicare Other | Admitting: Physical Medicine and Rehabilitation

## 2020-09-14 VITALS — BP 152/68 | HR 68

## 2020-09-14 DIAGNOSIS — M5416 Radiculopathy, lumbar region: Secondary | ICD-10-CM | POA: Diagnosis not present

## 2020-09-14 DIAGNOSIS — M47816 Spondylosis without myelopathy or radiculopathy, lumbar region: Secondary | ICD-10-CM

## 2020-09-14 DIAGNOSIS — M48062 Spinal stenosis, lumbar region with neurogenic claudication: Secondary | ICD-10-CM

## 2020-09-14 MED ORDER — METHYLPREDNISOLONE ACETATE 80 MG/ML IJ SUSP
80.0000 mg | Freq: Once | INTRAMUSCULAR | Status: AC
  Administered 2020-09-14: 80 mg

## 2020-09-14 NOTE — Progress Notes (Signed)
 .  Numeric Pain Rating Scale and Functional Assessment Average Pain 6   In the last MONTH (on 0-10 scale) has pain interfered with the following?  1. General activity like being  able to carry out your everyday physical activities such as walking, climbing stairs, carrying groceries, or moving a chair?  Rating(7)   +Driver, -BT, -Dye Allergies.  

## 2020-09-14 NOTE — Progress Notes (Signed)
Deborah Jordan - 86 y.o. female MRN [id number]  Date of birth: 10-Sep-1933  Office Visit Note: Visit Date: 09/14/2020 PCP: [name], MD Referred by: [name], MD  Subjective: Chief Complaint  Patient presents with  . Lower Back - Pain   HPI:  Deborah Jordan is a 86 y.o. female who comes in today at the request of Dr. Jean Rosenthal for planned Bilateral L4-L5 Lumbar epidural steroid injection with fluoroscopic guidance.  The patient has failed conservative care including home exercise, medications, time and activity modification.  This injection will be diagnostic and hopefully therapeutic.  Please see requesting physician notes for further details and justification.  MRI reviewed with images and spine model.  MRI reviewed in the note below.  This shows significant stenosis at L4-5 with multilevel facet arthropathy.  I actually saw her in 2018 to complete diagnostic and hopefully therapeutic facet joint blocks which at the time were pretty beneficial.  She state walking makes the pain worse but medications and heating pad helps ease the pain.   ROS Otherwise per HPI.  Assessment & Plan: Visit Diagnoses:  1. Lumbar radiculopathy   2. Spinal stenosis of lumbar region with neurogenic claudication   3. Spondylosis without myelopathy or radiculopathy, lumbar region     Plan: No additional findings.   Meds & Orders:  Meds ordered this encounter  Medications  . methylPREDNISolone acetate (DEPO-MEDROL) injection 80 mg    Orders Placed This Encounter  Procedures  . XR C-ARM NO REPORT  . Epidural Steroid injection    Follow-up: Return if symptoms worsen or fail to improve.   Procedures: No procedures performed  Lumbosacral Transforaminal Epidural Steroid Injection - Sub-Pedicular Approach with Fluoroscopic Guidance  Patient: Deborah Jordan      Date of Birth: 1933-08-19 MRN: [id number] PCP: [name], MD      Visit Date: 09/14/2020   Universal Protocol:      Date/Time: 09/14/2020  Consent Given By: the patient  Position: PRONE  Additional Comments: Vital signs were monitored before and after the procedure. Patient was prepped and draped in the usual sterile fashion. The correct patient, procedure, and site was verified.   Injection Procedure Details:  Procedure Site One Meds Administered:  Meds ordered this encounter  Medications  . methylPREDNISolone acetate (DEPO-MEDROL) injection 80 mg    Laterality: Bilateral  Location/Site:  L4-L5  Needle size: 22 G  Needle type: Spinal  Needle Placement: Transforaminal  Findings:    -Comments: Excellent flow of contrast along the nerve, nerve root and into the epidural space.  Procedure Details: After squaring off the end-plates to get a true AP view, the C-arm was positioned so that an oblique view of the foramen as noted above was visualized. The target area is just inferior to the "nose of the scotty dog" or sub pedicular. The soft tissues overlying this structure were infiltrated with 2-3 ml. of 1% Lidocaine without Epinephrine.  The spinal needle was inserted toward the target using a "trajectory" view along the fluoroscope beam.  Under AP and lateral visualization, the needle was advanced so it did not puncture dura and was located close the 6 O'Clock position of the pedical in AP tracterory. Biplanar projections were used to confirm position. Aspiration was confirmed to be negative for CSF and/or blood. A 1-2 ml. volume of Isovue-250 was injected and flow of contrast was noted at each level. Radiographs were obtained for documentation purposes.   After attaining the desired flow of contrast  documented above, a 0.5 to 1.0 ml test dose of 0.25% Marcaine was injected into each respective transforaminal space.  The patient was observed for 90 seconds post injection.  After no sensory deficits were reported, and normal lower extremity motor function was noted,   the above injectate was  administered so that equal amounts of the injectate were placed at each foramen (level) into the transforaminal epidural space.   Additional Comments:  The patient tolerated the procedure well Dressing: 2 x 2 sterile gauze and Band-Aid    Post-procedure details: Patient was observed during the procedure. Post-procedure instructions were reviewed.  Patient left the clinic in stable condition.      Clinical History: MRI LUMBAR SPINE WITHOUT CONTRAST  TECHNIQUE: Multiplanar, multisequence MR imaging of the lumbar spine was performed. No intravenous contrast was administered.  COMPARISON:  Lumbar spine radiograph 01/30/2017  Lumbar spine MRI 08/12/2010  FINDINGS: Segmentation:  Standard.  Alignment:  Right convex scoliosis with apex at L3.  Vertebrae:  Discogenic endplate signal changes at L1-L2 and L2-L3.  Conus medullaris: Extends to the L2 level. There is a mildly dilated central canal of the conus medullaris measuring 1.1 mm. This finding is unchanged.  Paraspinal and other soft tissues: Negative.  Disc levels:  T11-T12:  Mild disc bulge without stenosis.  T12-L1: Normal disc space and facets. No spinal canal or neuroforaminal stenosis.  L1-L2: Right eccentric disc bulge with small amount of fluid in the right facet joint. No spinal canal stenosis. Moderate right and mild left neural foraminal stenosis. This has worsened from the prior study.  L2-L3: There is a a right eccentric intermediate disc bulge. No central spinal canal stenosis. Moderate right and severe left neural foraminal stenosis, worsened from the prior study.  L3-L4: Intermediate diffuse disc bulge, left eccentric. Moderate spinal canal stenosis, unchanged. Unchanged severe left neural foraminal stenosis.  L4-L5: Large disc bulge with a component of central extrusion with inferior migration to the suprapedicle L5 level. Moderate bilateral facet hypertrophy. Severe spinal  canal stenosis, slightly worsened. Bilateral neural foraminal stenosis is also worsened, severe on the right and moderate to severe on the left.  L5-S1: Small disc bulge moderate facet hypertrophy, unchanged. No spinal canal stenosis. Mild right foraminal stenosis, unchanged.  Visualized sacrum: Normal.  IMPRESSION: 1. Worsening of L4-L5 degenerative disease with severe spinal canal stenosis and severe right, moderate to severe left neural foraminal stenosis. 2. Worsening degenerative disease at L1-L2 and L2-L3 with moderate right L1-2 foraminal stenosis, moderate right L2-3 foraminal stenosis and severe left L2-3 foraminal stenosis. 3. Unchanged moderate spinal canal stenosis and bilateral severe neural foraminal stenosis at L3-L4.   Electronically Signed   By: Ulyses Jarred M.D.   On: 10/19/2017 03:45     Objective:  VS:  HT:    WT:   BMI:     BP:(!) 152/68  HR:68bpm  TEMP: ( )  RESP:  Physical Exam Constitutional:      General: She is not in acute distress.    Appearance: Normal appearance. She is not ill-appearing.  HENT:     Head: Normocephalic and atraumatic.     Right Ear: External ear normal.     Left Ear: External ear normal.  Eyes:     Extraocular Movements: Extraocular movements intact.  Cardiovascular:     Rate and Rhythm: Normal rate.     Pulses: Normal pulses.  Musculoskeletal:     Right lower leg: No edema.     Left lower leg: No edema.  Comments: Patient has good distal strength with no pain over the greater trochanters.  No clonus or focal weakness.  Skin:    Findings: No erythema, lesion or rash.  Neurological:     General: No focal deficit present.     Mental Status: She is alert and oriented to person, place, and time.     Sensory: No sensory deficit.     Motor: No weakness or abnormal muscle tone.     Coordination: Coordination normal.  Psychiatric:        Mood and Affect: Mood normal.        Behavior: Behavior normal.       Imaging: XR C-ARM NO REPORT  Result Date: 09/14/2020 Please see Notes tab for imaging impression.

## 2020-09-14 NOTE — Procedures (Signed)
Lumbosacral Transforaminal Epidural Steroid Injection - Sub-Pedicular Approach with Fluoroscopic Guidance  Patient: Deborah Jordan      Date of Birth: 10/02/1933 MRN: [id number] PCP: [name], MD      Visit Date: 09/14/2020   Universal Protocol:    Date/Time: 09/14/2020  Consent Given By: the patient  Position: PRONE  Additional Comments: Vital signs were monitored before and after the procedure. Patient was prepped and draped in the usual sterile fashion. The correct patient, procedure, and site was verified.   Injection Procedure Details:  Procedure Site One Meds Administered:  Meds ordered this encounter  Medications  . methylPREDNISolone acetate (DEPO-MEDROL) injection 80 mg    Laterality: Bilateral  Location/Site:  L4-L5  Needle size: 22 G  Needle type: Spinal  Needle Placement: Transforaminal  Findings:    -Comments: Excellent flow of contrast along the nerve, nerve root and into the epidural space.  Procedure Details: After squaring off the end-plates to get a true AP view, the C-arm was positioned so that an oblique view of the foramen as noted above was visualized. The target area is just inferior to the "nose of the scotty dog" or sub pedicular. The soft tissues overlying this structure were infiltrated with 2-3 ml. of 1% Lidocaine without Epinephrine.  The spinal needle was inserted toward the target using a "trajectory" view along the fluoroscope beam.  Under AP and lateral visualization, the needle was advanced so it did not puncture dura and was located close the 6 O'Clock position of the pedical in AP tracterory. Biplanar projections were used to confirm position. Aspiration was confirmed to be negative for CSF and/or blood. A 1-2 ml. volume of Isovue-250 was injected and flow of contrast was noted at each level. Radiographs were obtained for documentation purposes.   After attaining the desired flow of contrast documented above, a 0.5 to 1.0 ml  test dose of 0.25% Marcaine was injected into each respective transforaminal space.  The patient was observed for 90 seconds post injection.  After no sensory deficits were reported, and normal lower extremity motor function was noted,   the above injectate was administered so that equal amounts of the injectate were placed at each foramen (level) into the transforaminal epidural space.   Additional Comments:  The patient tolerated the procedure well Dressing: 2 x 2 sterile gauze and Band-Aid    Post-procedure details: Patient was observed during the procedure. Post-procedure instructions were reviewed.  Patient left the clinic in stable condition.

## 2021-01-14 ENCOUNTER — Ambulatory Visit: Payer: Medicare Other | Admitting: Cardiology

## 2021-01-26 ENCOUNTER — Ambulatory Visit: Payer: Medicare Other | Admitting: Cardiology

## 2021-01-26 ENCOUNTER — Other Ambulatory Visit: Payer: Self-pay

## 2021-01-26 ENCOUNTER — Encounter: Payer: Self-pay | Admitting: Cardiology

## 2021-01-26 VITALS — BP 124/70 | HR 86 | Temp 97.5°F | Resp 17 | Ht <= 58 in | Wt 179.4 lb

## 2021-01-26 DIAGNOSIS — I10 Essential (primary) hypertension: Secondary | ICD-10-CM

## 2021-01-26 DIAGNOSIS — R0989 Other specified symptoms and signs involving the circulatory and respiratory systems: Secondary | ICD-10-CM

## 2021-01-26 DIAGNOSIS — N1831 Chronic kidney disease, stage 3a: Secondary | ICD-10-CM

## 2021-01-26 DIAGNOSIS — I452 Bifascicular block: Secondary | ICD-10-CM | POA: Diagnosis not present

## 2021-01-26 DIAGNOSIS — R9431 Abnormal electrocardiogram [ECG] [EKG]: Secondary | ICD-10-CM | POA: Diagnosis not present

## 2021-01-26 NOTE — Progress Notes (Signed)
Primary Physician/Referring:  [name], MD  Patient ID: Deborah Jordan, female    DOB: 1933/11/05, 87 y.o.   MRN: [id number]  Chief Complaint  Patient presents with  . Follow-up  . Abnormal ECG    1 year   HPI:    Deborah Jordan  is a 87 y.o. with hypertension, hyperlipidemia, history of chronic kidney disease, hyperglycemia, bifascicular block on EKG, right carotid bruit, carotid duplex in 2018 revealing no significant disease, last seen a year ago for palpitation. She now presents for follow-up states that she is doing well and has not had any recurrence of palpitations since being on carvedilol.  No specific complaints today, no PND or orthopnea, no dizziness or syncope, no leg edema.    Past Medical History:  Diagnosis Date  . Anxiety   . Arthritis    osteoarthritis. spinal stenosis. Scoliosis of spine-degenerative spine.  . Bilateral cataracts   . Chronic kidney disease    STAGE 4  . DDD (degenerative disc disease), lumbar   . Dyspnea   . GERD (gastroesophageal reflux disease)    controls with Nexium  . Grade I diastolic dysfunction 66/05/3015   Noted on ECHO  . History of cardiomegaly   . History of gallstones   . Hypertension   . LVH (left ventricular hypertrophy) 05/04/2017   Mil, noted on ECHO  . Pre-diabetes   . RBBB (right bundle branch block)   . Spinal stenosis   . Wears partial dentures    Past Surgical History:  Procedure Laterality Date  . CATARACT EXTRACTION Bilateral 03/06/2013  . CHOLECYSTECTOMY    . COLONOSCOPY    . CRYOTHERAPY    . DILATION AND CURETTAGE OF UTERUS    . HEMORRHOID SURGERY N/A 09/21/2018   Procedure: SINGLE COLUMN HEMORRHOIDECTOMY, HEMORRHOIDPEXY;  Surgeon: Leighton Ruff, MD;  Location: [facility];  Service: General;  Laterality: N/A;  . TOTAL HIP ARTHROPLASTY Left 03/08/2013   Procedure: LEFT TOTAL HIP ARTHROPLASTY ANTERIOR APPROACH;  Surgeon: Mcarthur Rossetti, MD;  Location: [facility];  Service:  Orthopedics;  Laterality: Left;   Social History   Tobacco Use  . Smoking status: Never Smoker  . Smokeless tobacco: Never Used  Substance Use Topics  . Alcohol use: Yes    Comment: OCC    ROS  Review of Systems  Cardiovascular: Negative for chest pain, dyspnea on exertion, leg swelling and palpitations.  Gastrointestinal: Negative for melena.   Objective  Blood pressure 124/70, pulse 86, temperature (!) 97.5 F (36.4 C), temperature source Temporal, resp. rate 17, height 4' 10" (1.473 m), weight 179 lb 6.4 oz (81.4 kg), SpO2 98 %.  Vitals with BMI 01/26/2021 09/14/2020 01/17/2020  Height 4' 10" - 4' 10"  Weight 179 lbs 6 oz - 177 lbs  BMI 01.0 - 37  Systolic 932 355 732  Diastolic 70 68 64  Pulse 86 68 87     Physical Exam Constitutional:      Comments: Short stature and moderately obese  Cardiovascular:     Rate and Rhythm: Normal rate and regular rhythm.     Pulses:          Carotid pulses are on the right side with bruit.      Dorsalis pedis pulses are 1+ on the right side and 1+ on the left side.       Posterior tibial pulses are 0 on the right side and 0 on the left side.     Heart sounds:  Murmur heard.   Early systolic murmur is present with a grade of 2/6 radiating to the apex.   Pulmonary:     Effort: Pulmonary effort is normal. No accessory muscle usage or respiratory distress.     Breath sounds: Normal breath sounds.  Abdominal:     General: Bowel sounds are normal.     Palpations: Abdomen is soft.  Musculoskeletal:        General: Normal range of motion.    Laboratory examination:   External labs  11/05/2019 :   Labs 06/08/2020:   Serum glucose 98 mg, BUN 21, creatinine 0.98, EGFR 54 mL, sodium 135, potassium 4.6, CMP otherwise normal.  A1c 6.2%.  Hb 12.6/HCT 37.5, platelets 470.   Cholesterol, total 145.000 11/05/2019 HDL 53.000 11/05/2019 LDL 75.000 11/05/2019 Triglycerides 87.000 11/05/2019 A1C 6.100 11/05/2019 Hemoglobin 12.600  11/05/2019 Creatinine, Serum 1.050 11/05/2019 Potassium 4.800 11/05/2019 Magnesium N/D ALT (SGPT) 10.000 11/05/2019 TSH 2.240 11/05/2019  Medications and allergies   Allergies  Allergen Reactions  . Ace Inhibitors Cough    Current Outpatient Medications on File Prior to Visit  Medication Sig Dispense Refill  . acetaminophen (TYLENOL) 650 MG CR tablet Take 650 mg by mouth every 8 (eight) hours as needed for pain.    Marland Kitchen amLODipine (NORVASC) 10 MG tablet Take 10 mg by mouth daily.    Marland Kitchen atorvastatin (LIPITOR) 10 MG tablet Take 10 mg by mouth daily.    . carvedilol (COREG) 12.5 MG tablet Take 12.5 mg by mouth 2 (two) times daily with a meal.    . Cholecalciferol (VITAMIN D) 2000 units CAPS Take 2,000 Units by mouth daily.    Marland Kitchen esomeprazole (NEXIUM) 40 MG capsule Take 40 mg by mouth daily.    Marland Kitchen loratadine (CLARITIN) 10 MG tablet Take 10 mg by mouth daily as needed.     No current facility-administered medications on file prior to visit.     Radiology:  No results found.  Cardiac Studies:   Carotid Doppler 07/12/2016: Minimal stenosis in the bilateral internal carotid artery (minimal) with mild mixed plaque. Antegrade vertebral artery flow. Compared to 07/02/15, right ICA stenosis of < 49% no longer evident. Further studies if clinically indicated.  EKG:   EKG 01/26/2021: Normal sinus rhythm at rate of 70 bpm, left axis deviation, left anterior fascicular block.  Right bundle branch block.  Bifascicular block.  Poor R wave progression, cannot exclude anteroseptal infarct old.   No significant change from EKG 01/17/2020   Assessment     ICD-10-CM   1. Bifascicular block  I45.2 EKG 12-Lead  2. Primary hypertension  I10   3. Right carotid bruit  R09.89   4. Stage 3a chronic kidney disease (HCC)  N18.31     No orders of the defined types were placed in this encounter.  Medications Discontinued During This Encounter  Medication Reason  . ALPRAZolam (XANAX) 0.25 MG tablet Patient  Preference  . carvedilol (COREG) 6.25 MG tablet Dose change  . losartan (COZAAR) 100 MG tablet Discontinued by provider     Recommendations:   Deborah Jordan  is a 87 y.o. with hypertension, hyperlipidemia, history of chronic kidney disease, hyperglycemia, bifascicular block on EKG, right carotid bruit, carotid duplex in 2018 revealing no significant disease, last seen a year ago for palpitation. She now presents for follow-up states that she is doing well and has not had any recurrence of palpitations since being on carvedilol.  She remains asymptomatic. On today's evaluation, no change in her carotid  bruit, previously has had no significant carotid disease and I suspect aortic sclerotic murmur is the etiology for her carotid bruit. Her lipids are well controlled and she is on a statin, blood pressure is also well controlled. She has not had any further palpitation. She remains stable from cardiac standpoint, I will see her back on a as needed basis. I reviewed her external records, renal function is also remained stable. Patient felt reassured.    Adrian Prows, MD, [facility] 01/26/2021, 4:45 PM Office: [phone] Pager: [phone]

## 2021-04-14 DIAGNOSIS — M7989 Other specified soft tissue disorders: Secondary | ICD-10-CM | POA: Diagnosis not present

## 2021-04-14 DIAGNOSIS — I10 Essential (primary) hypertension: Secondary | ICD-10-CM | POA: Diagnosis not present

## 2021-04-14 DIAGNOSIS — R600 Localized edema: Secondary | ICD-10-CM | POA: Diagnosis not present

## 2021-04-14 DIAGNOSIS — N183 Chronic kidney disease, stage 3 unspecified: Secondary | ICD-10-CM | POA: Diagnosis not present

## 2021-04-15 ENCOUNTER — Other Ambulatory Visit: Payer: Self-pay

## 2021-04-15 ENCOUNTER — Ambulatory Visit (HOSPITAL_COMMUNITY)
Admission: RE | Admit: 2021-04-15 | Discharge: 2021-04-15 | Disposition: A | Discharge status: Home / Self Care | Payer: Medicare Other | Source: Ambulatory Visit | Attending: Family Medicine | Admitting: Family Medicine

## 2021-04-15 ENCOUNTER — Other Ambulatory Visit (HOSPITAL_COMMUNITY): Payer: Self-pay | Admitting: Family Medicine

## 2021-04-15 DIAGNOSIS — R600 Localized edema: Secondary | ICD-10-CM

## 2021-04-28 ENCOUNTER — Ambulatory Visit: Payer: Medicare Other | Admitting: Surgery

## 2021-04-28 ENCOUNTER — Ambulatory Visit: Payer: Self-pay

## 2021-04-28 ENCOUNTER — Encounter: Payer: Self-pay | Admitting: Surgery

## 2021-04-28 DIAGNOSIS — G8929 Other chronic pain: Secondary | ICD-10-CM

## 2021-04-28 DIAGNOSIS — M4807 Spinal stenosis, lumbosacral region: Secondary | ICD-10-CM

## 2021-04-28 DIAGNOSIS — M5442 Lumbago with sciatica, left side: Secondary | ICD-10-CM

## 2021-04-28 NOTE — Progress Notes (Signed)
Office Visit Note   Patient: Deborah Jordan           Date of Birth: 07-13-1933           MRN: [id number] Visit Date: 04/28/2021              Requested by: Deborah Jordan, MD [address] [place],  [place] [address] PCP: Deborah Jordan, MD   Assessment & Plan: Visit Diagnoses:  1. Chronic bilateral low back pain with left-sided sciatica   2. Spinal stenosis of lumbosacral region     Plan: With patient's worsening back pain, lower extremity radiculopathy and neurogenic claudication that has failed conservative treatment with multiple ESI's recommend repeating her lumbar MRI and comparing to the study that was done October 2018.  I will have her follow-up with Dr. Louanne Skye in 3 weeks for recheck to discuss results and further treatment options.  We will see if she is a potential candidate for a single possibly two-level lumbar decompression that can help potentially improve her quality of life.  Follow-Up Instructions: Return in about 3 weeks (around 05/19/2021) for WITH DR NITKA TO REVIEW LUMBAR MRI SCAN.   Orders:  Orders Placed This Encounter  Procedures  . XR Lumbar Spine 2-3 Views  . MR Lumbar Spine w/o contrast   No orders of the defined types were placed in this encounter.     Procedures: No procedures performed   Clinical Data: No additional findings.   Subjective: Chief Complaint  Patient presents with  . Lower Back - Pain    H/o injections with Dr. Ernestina Patches states they are not helping her    HPI 87 year old white female comes in today with complaints of worsening low back pain, leg pain and neurogenic claudication.  Patient was seen by Dr. Ninfa Linden a few years ago for ongoing back pain and had lumbar MRI scan ordered by him which was done October 18, 2017.  This study showed:  CLINICAL DATA:  Gait changes and bilateral low back pain radiating to both lower extremities.  EXAM: MRI LUMBAR SPINE WITHOUT CONTRAST  TECHNIQUE: Multiplanar,  multisequence MR imaging of the lumbar spine was performed. No intravenous contrast was administered.  COMPARISON:  Lumbar spine radiograph 01/30/2017  Lumbar spine MRI 08/12/2010  FINDINGS: Segmentation:  Standard.  Alignment:  Right convex scoliosis with apex at L3.  Vertebrae:  Discogenic endplate signal changes at L1-L2 and L2-L3.  Conus medullaris: Extends to the L2 level. There is a mildly dilated central canal of the conus medullaris measuring 1.1 mm. This finding is unchanged.  Paraspinal and other soft tissues: Negative.  Disc levels:  T11-T12:  Mild disc bulge without stenosis.  T12-L1: Normal disc space and facets. No spinal canal or neuroforaminal stenosis.  L1-L2: Right eccentric disc bulge with small amount of fluid in the right facet joint. No spinal canal stenosis. Moderate right and mild left neural foraminal stenosis. This has worsened from the prior study.  L2-L3: There is a a right eccentric intermediate disc bulge. No central spinal canal stenosis. Moderate right and severe left neural foraminal stenosis, worsened from the prior study.  L3-L4: Intermediate diffuse disc bulge, left eccentric. Moderate spinal canal stenosis, unchanged. Unchanged severe left neural foraminal stenosis.  L4-L5: Large disc bulge with a component of central extrusion with inferior migration to the suprapedicle L5 level. Moderate bilateral facet hypertrophy. Severe spinal canal stenosis, slightly worsened. Bilateral neural foraminal stenosis is also worsened, severe on the right and moderate to severe  on the left.  L5-S1: Small disc bulge moderate facet hypertrophy, unchanged. No spinal canal stenosis. Mild right foraminal stenosis, unchanged.  Visualized sacrum: Normal.  IMPRESSION: 1. Worsening of L4-L5 degenerative disease with severe spinal canal stenosis and severe right, moderate to severe left neural foraminal stenosis. 2. Worsening  degenerative disease at L1-L2 and L2-L3 with moderate right L1-2 foraminal stenosis, moderate right L2-3 foraminal stenosis and severe left L2-3 foraminal stenosis. 3. Unchanged moderate spinal canal stenosis and bilateral severe neural foraminal stenosis at L3-L4.   Electronically Signed   By: Ulyses Jarred M.D.   On: 10/19/2017 03:45  States that she has ongoing low back pain that radiates to the bilateral buttocks and down to her knees.  States that she does not try to walk too much due to her ongoing symptoms.  She is had multiple lumbar ESI's with most recent being in September 14, 2020.  States that she did not have any improvement with this.  She has not had an updated lumbar MRI since the 1 in 2018.     Review of Systems No current cardiac pulmonary GI GU issues  Objective: Vital Signs: There were no vitals taken for this visit.  Physical Exam Constitutional:      General: She is not in acute distress.    Comments: Very pleasant elderly white female alert and oriented in no acute distress.  HENT:     Head: Normocephalic and atraumatic.  Eyes:     Extraocular Movements: Extraocular movements intact.  Pulmonary:     Effort: No respiratory distress.  Skin:    Comments: Patient ambulates with a rolling walker.  Bilateral lumbar paraspinal tenderness/spasm and she is also tender over the bilateral SI joints.  Negative logroll bilateral hips.  No focal motor deficits.  Bilateral calves nontender.  Neurological:     Mental Status: She is alert.  Psychiatric:        Mood and Affect: Mood normal.     Ortho Exam  Specialty Comments:  No specialty comments available.  Imaging: No results found.   PMFS History: Patient Active Problem List   Diagnosis Date Noted  . Chronic bilateral low back pain with bilateral sciatica 09/27/2017  . Bilateral chronic knee pain 03/13/2017  . Pleural effusion 03/10/2013  . Hyponatremia 03/10/2013  . Hypertension 03/10/2013  .  Degenerative arthritis of hip 03/08/2013   Past Medical History:  Diagnosis Date  . Anxiety   . Arthritis    osteoarthritis. spinal stenosis. Scoliosis of spine-degenerative spine.  . Bilateral cataracts   . Chronic kidney disease    STAGE 4  . DDD (degenerative disc disease), lumbar   . Dyspnea   . GERD (gastroesophageal reflux disease)    controls with Nexium  . Grade I diastolic dysfunction 85/88/5027   Noted on ECHO  . History of cardiomegaly   . History of gallstones   . Hypertension   . LVH (left ventricular hypertrophy) 05/04/2017   Mil, noted on ECHO  . Pre-diabetes   . RBBB (right bundle branch block)   . Spinal stenosis   . Wears partial dentures     Family History  Problem Relation Age of Onset  . Heart disease Father   . Heart failure Father   . Diabetes Father   . Atrial fibrillation Sister     Past Surgical History:  Procedure Laterality Date  . CATARACT EXTRACTION Bilateral 03/06/2013  . CHOLECYSTECTOMY    . COLONOSCOPY    . CRYOTHERAPY    .  DILATION AND CURETTAGE OF UTERUS    . HEMORRHOID SURGERY N/A 09/21/2018   Procedure: SINGLE COLUMN HEMORRHOIDECTOMY, HEMORRHOIDPEXY;  Surgeon: Leighton Ruff, MD;  Location: [facility];  Service: General;  Laterality: N/A;  . TOTAL HIP ARTHROPLASTY Left 03/08/2013   Procedure: LEFT TOTAL HIP ARTHROPLASTY ANTERIOR APPROACH;  Surgeon: Mcarthur Rossetti, MD;  Location: [facility];  Service: Orthopedics;  Laterality: Left;   Social History   Occupational History  . Not on file  Tobacco Use  . Smoking status: Never Smoker  . Smokeless tobacco: Never Used  Vaping Use  . Vaping Use: Never used  Substance and Sexual Activity  . Alcohol use: Yes    Comment: OCC  . Drug use: Never  . Sexual activity: Not Currently    Birth control/protection: Post-menopausal

## 2021-05-04 ENCOUNTER — Other Ambulatory Visit: Payer: Self-pay

## 2021-05-04 MED ORDER — DIAZEPAM 2 MG PO TABS: ORAL_TABLET | ORAL | 0 refills | Status: DC

## 2021-05-04 NOTE — Telephone Encounter (Signed)
Patient called she stated she has to go for a mri next Thursday she is requesting a valium to be sent to the pharmacy due to having claustrophobia she wants the rx to be sent to [facility] in [place] call [phone]

## 2021-05-04 NOTE — Telephone Encounter (Signed)
Sent request to James 

## 2021-05-04 NOTE — Telephone Encounter (Signed)
I called and advised that her rx was sent to her pharmacy

## 2021-05-12 DIAGNOSIS — N183 Chronic kidney disease, stage 3 unspecified: Secondary | ICD-10-CM | POA: Diagnosis not present

## 2021-05-12 DIAGNOSIS — R7303 Prediabetes: Secondary | ICD-10-CM | POA: Diagnosis not present

## 2021-05-12 DIAGNOSIS — I517 Cardiomegaly: Secondary | ICD-10-CM | POA: Diagnosis not present

## 2021-05-12 DIAGNOSIS — I4510 Unspecified right bundle-branch block: Secondary | ICD-10-CM | POA: Diagnosis not present

## 2021-05-12 DIAGNOSIS — I10 Essential (primary) hypertension: Secondary | ICD-10-CM | POA: Diagnosis not present

## 2021-05-12 DIAGNOSIS — I129 Hypertensive chronic kidney disease with stage 1 through stage 4 chronic kidney disease, or unspecified chronic kidney disease: Secondary | ICD-10-CM | POA: Diagnosis not present

## 2021-05-12 DIAGNOSIS — E785 Hyperlipidemia, unspecified: Secondary | ICD-10-CM | POA: Diagnosis not present

## 2021-05-12 DIAGNOSIS — Z0000 Encounter for general adult medical examination without abnormal findings: Secondary | ICD-10-CM | POA: Diagnosis not present

## 2021-05-12 DIAGNOSIS — M818 Other osteoporosis without current pathological fracture: Secondary | ICD-10-CM | POA: Diagnosis not present

## 2021-05-13 ENCOUNTER — Ambulatory Visit
Admission: RE | Admit: 2021-05-13 | Discharge: 2021-05-13 | Disposition: A | Discharge status: Home / Self Care | Payer: Medicare Other | Source: Ambulatory Visit | Attending: Surgery | Admitting: Surgery

## 2021-05-13 ENCOUNTER — Other Ambulatory Visit: Payer: Self-pay

## 2021-05-13 DIAGNOSIS — M545 Low back pain, unspecified: Secondary | ICD-10-CM | POA: Diagnosis not present

## 2021-05-13 DIAGNOSIS — M48061 Spinal stenosis, lumbar region without neurogenic claudication: Secondary | ICD-10-CM | POA: Diagnosis not present

## 2021-05-13 DIAGNOSIS — M4807 Spinal stenosis, lumbosacral region: Secondary | ICD-10-CM

## 2021-05-18 ENCOUNTER — Other Ambulatory Visit: Payer: Self-pay

## 2021-05-18 ENCOUNTER — Emergency Department (HOSPITAL_COMMUNITY)
Admission: EM | Admit: 2021-05-18 | Discharge: 2021-05-19 | Disposition: A | Discharge status: Home / Self Care | Payer: Medicare Other | Attending: Medical | Admitting: Medical

## 2021-05-18 ENCOUNTER — Emergency Department (HOSPITAL_COMMUNITY): Payer: Medicare Other

## 2021-05-18 ENCOUNTER — Encounter (HOSPITAL_COMMUNITY): Payer: Self-pay | Admitting: Emergency Medicine

## 2021-05-18 DIAGNOSIS — R6889 Other general symptoms and signs: Secondary | ICD-10-CM | POA: Diagnosis not present

## 2021-05-18 DIAGNOSIS — I4510 Unspecified right bundle-branch block: Secondary | ICD-10-CM | POA: Diagnosis not present

## 2021-05-18 DIAGNOSIS — K5900 Constipation, unspecified: Secondary | ICD-10-CM | POA: Insufficient documentation

## 2021-05-18 DIAGNOSIS — R4182 Altered mental status, unspecified: Secondary | ICD-10-CM | POA: Insufficient documentation

## 2021-05-18 DIAGNOSIS — R4781 Slurred speech: Secondary | ICD-10-CM | POA: Diagnosis not present

## 2021-05-18 DIAGNOSIS — R4701 Aphasia: Secondary | ICD-10-CM | POA: Diagnosis not present

## 2021-05-18 DIAGNOSIS — Z5321 Procedure and treatment not carried out due to patient leaving prior to being seen by health care provider: Secondary | ICD-10-CM | POA: Diagnosis not present

## 2021-05-18 DIAGNOSIS — R0902 Hypoxemia: Secondary | ICD-10-CM | POA: Diagnosis not present

## 2021-05-18 DIAGNOSIS — Z743 Need for continuous supervision: Secondary | ICD-10-CM | POA: Diagnosis not present

## 2021-05-18 LAB — URINALYSIS, ROUTINE W REFLEX MICROSCOPIC
Bilirubin Urine: NEGATIVE
Glucose, UA: NEGATIVE mg/dL
Hgb urine dipstick: NEGATIVE
Ketones, ur: NEGATIVE mg/dL
Nitrite: NEGATIVE
Protein, ur: NEGATIVE mg/dL
Specific Gravity, Urine: 1.009 (ref 1.005–1.030)
pH: 6 (ref 5.0–8.0)

## 2021-05-18 LAB — CBC WITH DIFFERENTIAL/PLATELET
Abs Immature Granulocytes: 0.05 10*3/uL (ref 0.00–0.07)
Basophils Absolute: 0 10*3/uL (ref 0.0–0.1)
Basophils Relative: 0 %
Eosinophils Absolute: 0.1 10*3/uL (ref 0.0–0.5)
Eosinophils Relative: 1 %
HCT: 39.6 % (ref 36.0–46.0)
Hemoglobin: 13.4 g/dL (ref 12.0–15.0)
Immature Granulocytes: 1 %
Lymphocytes Relative: 30 %
Lymphs Abs: 2.3 10*3/uL (ref 0.7–4.0)
MCH: 28.9 pg (ref 26.0–34.0)
MCHC: 33.8 g/dL (ref 30.0–36.0)
MCV: 85.3 fL (ref 80.0–100.0)
Monocytes Absolute: 1.1 10*3/uL — ABNORMAL HIGH (ref 0.1–1.0)
Monocytes Relative: 15 %
Neutro Abs: 4.1 10*3/uL (ref 1.7–7.7)
Neutrophils Relative %: 53 %
Platelets: 460 10*3/uL — ABNORMAL HIGH (ref 150–400)
RBC: 4.64 MIL/uL (ref 3.87–5.11)
RDW: 12.7 % (ref 11.5–15.5)
WBC: 7.8 10*3/uL (ref 4.0–10.5)
nRBC: 0 % (ref 0.0–0.2)

## 2021-05-18 LAB — COMPREHENSIVE METABOLIC PANEL
ALT: 13 U/L (ref 0–44)
AST: 20 U/L (ref 15–41)
Albumin: 3.9 g/dL (ref 3.5–5.0)
Alkaline Phosphatase: 88 U/L (ref 38–126)
Anion gap: 10 (ref 5–15)
BUN: 19 mg/dL (ref 8–23)
CO2: 23 mmol/L (ref 22–32)
Calcium: 9.3 mg/dL (ref 8.9–10.3)
Chloride: 96 mmol/L — ABNORMAL LOW (ref 98–111)
Creatinine, Ser: 1.31 mg/dL — ABNORMAL HIGH (ref 0.44–1.00)
GFR, Estimated: 40 mL/min — ABNORMAL LOW (ref >60)
Glucose, Bld: 125 mg/dL — ABNORMAL HIGH (ref 70–99)
Potassium: 4.1 mmol/L (ref 3.5–5.1)
Sodium: 129 mmol/L — ABNORMAL LOW (ref 135–145)
Total Bilirubin: 0.4 mg/dL (ref 0.3–1.2)
Total Protein: 7.1 g/dL (ref 6.5–8.1)

## 2021-05-18 LAB — LIPASE, BLOOD: Lipase: 25 U/L (ref 11–51)

## 2021-05-18 NOTE — ED Triage Notes (Addendum)
Per ems, pt coming from home. Pt reports family noticed AMS with aphasia that started this morning when she woke up about 0700 today - episode did not last long. LKW at midnight. Pt does not have any symptoms at this time. A&Ox4 at this time. Not on blood thinners. Pt also hasn't had a BM for past 3 days, concerned for constipation, hx of hemorrhoids.

## 2021-05-18 NOTE — ED Provider Notes (Signed)
Emergency Medicine Provider Triage Evaluation Note  Deborah Jordan , a 87 y.o. female  was evaluated in triage.  Pt complains of altered mental status. LKN midnight. Family noticed aphasia earlier today; has resolved at this time. Pt reports she was trying to get words out  But couldn't. Didn't have any other symptoms. Reports constipation for 3 days with mild abdominal fullness; no pain. No nausea or vomiting. Currently feels fine.   Review of Systems  Positive: + constipation, aphasia Negative: - HA, weakness/numbness  Physical Exam  There were no vitals taken for this visit. Gen:   Awake, no distress   Resp:  Normal effort  MSK:   Moves extremities without difficulty  Other:  Neuro intact  Medical Decision Making  Medically screening exam initiated at 4:56 PM.  Appropriate orders placed.  Deborah Jordan was informed that the remainder of the evaluation will be completed by another provider, this initial triage assessment does not replace that evaluation, and the importance of remaining in the ED until their evaluation is complete.     Deborah Maize, PA-C 05/18/21 1658    Deborah Bo, MD 05/18/21 [phone]

## 2021-05-18 NOTE — ED Notes (Signed)
Pt left. Pt stated she was feeling much better and would call her doctor in the morning. Pt wasn't advised to stay.

## 2021-05-19 DIAGNOSIS — R479 Unspecified speech disturbances: Secondary | ICD-10-CM | POA: Diagnosis not present

## 2021-05-19 DIAGNOSIS — R410 Disorientation, unspecified: Secondary | ICD-10-CM | POA: Diagnosis not present

## 2021-05-25 DIAGNOSIS — N289 Disorder of kidney and ureter, unspecified: Secondary | ICD-10-CM | POA: Diagnosis not present

## 2021-05-28 ENCOUNTER — Other Ambulatory Visit: Payer: Self-pay | Admitting: Family Medicine

## 2021-05-28 DIAGNOSIS — R410 Disorientation, unspecified: Secondary | ICD-10-CM

## 2021-06-03 ENCOUNTER — Other Ambulatory Visit: Payer: Self-pay

## 2021-06-03 ENCOUNTER — Encounter: Payer: Self-pay | Admitting: Specialist

## 2021-06-03 ENCOUNTER — Ambulatory Visit: Payer: Medicare Other | Admitting: Specialist

## 2021-06-03 VITALS — BP 133/76 | HR 66 | Ht <= 58 in | Wt 179.5 lb

## 2021-06-03 DIAGNOSIS — M1711 Unilateral primary osteoarthritis, right knee: Secondary | ICD-10-CM

## 2021-06-03 DIAGNOSIS — M48062 Spinal stenosis, lumbar region with neurogenic claudication: Secondary | ICD-10-CM | POA: Diagnosis not present

## 2021-06-03 DIAGNOSIS — M4156 Other secondary scoliosis, lumbar region: Secondary | ICD-10-CM | POA: Diagnosis not present

## 2021-06-03 DIAGNOSIS — M5136 Other intervertebral disc degeneration, lumbar region: Secondary | ICD-10-CM

## 2021-06-03 DIAGNOSIS — M1712 Unilateral primary osteoarthritis, left knee: Secondary | ICD-10-CM | POA: Diagnosis not present

## 2021-06-03 DIAGNOSIS — M4726 Other spondylosis with radiculopathy, lumbar region: Secondary | ICD-10-CM | POA: Diagnosis not present

## 2021-06-03 MED ORDER — GABAPENTIN 100 MG PO CAPS: 100.0000 mg | ORAL_CAPSULE | Freq: Every day | ORAL | 3 refills | Status: DC

## 2021-06-03 MED ORDER — DULOXETINE HCL 20 MG PO CPEP: 20.0000 mg | ORAL_CAPSULE | Freq: Every day | ORAL | 3 refills | Status: DC

## 2021-06-03 NOTE — Progress Notes (Signed)
Office Visit Note   Patient: Deborah Jordan           Date of Birth: [birth year]           MRN: [id number] Visit Date: 06/03/2021              Requested by: [name], MD [address] [place],  [place] [address] PCP: [name], MD   Assessment & Plan: Visit Diagnoses:  1. Spinal stenosis of lumbar region with neurogenic claudication   2. Other secondary scoliosis, lumbar region   3. Degenerative disc disease, lumbar   4. Other spondylosis with radiculopathy, lumbar region     Plan: Avoid bending, stooping and avoid lifting weights greater than 10 lbs. Avoid prolong standing and walking. Avoid frequent bending and stooping  No lifting greater than 10 lbs. May use ice or moist heat for pain. Weight loss is of benefit. Handicap license is approved. Dr. Romona Curls secretary/Assistant will call to arrange for epidural steroid injection   Follow-Up Instructions: No follow-ups on file.   Orders:  No orders of the defined types were placed in this encounter.  No orders of the defined types were placed in this encounter.     Procedures: No procedures performed   Clinical Data: No additional findings.   Subjective: Chief Complaint  Patient presents with   Lower Back - Follow-up    MRI Review LSP    87 year old female with history of walking difficulty and has been using a walker for years. Lately she needs the walker constantly. She has had hip replacement surgery by Dr. Ninfa Linden in 2014 via anterior approach. Her walking is getting worse and she has fallen a  Couple of times. She caught her foot on the rug with the last fall. Sees Dr. Ninfa Linden /Dr. Marlou Sa for knee injection. Previous lumbar ESIs  Seem to relieve the pain. Pain is mainly in the back and there is pain into the left leg left lateral thigh and this is present with standing and Walking. In bed there is more pain lately but usually improves and she is able to sleep. Stage 4 CKD, now  at stage 3. Walking distance without  A walker is poor less than 20-25 feet. She leans on items and furniture to get to the bathroom. She will do grocery shopping and uses the Museum/gallery conserv[employer].   Review of Systems  Constitutional: Negative.   HENT:  Positive for congestion, dental problem, postnasal drip, rhinorrhea, sinus pressure and sinus pain. Negative for drooling, ear discharge, ear pain, facial swelling, hearing loss, mouth sores, nosebleeds, sneezing, sore throat, tinnitus, trouble swallowing and voice change.   Eyes: Negative.   Respiratory:  Positive for apnea, cough and shortness of breath. Negative for choking, chest tightness, wheezing and stridor.   Cardiovascular:  Positive for leg swelling. Negative for chest pain and palpitations.  Gastrointestinal:  Positive for anal bleeding, blood in stool and constipation. Negative for abdominal distention, abdominal pain, diarrhea, nausea, rectal pain and vomiting.  Endocrine: Negative for cold intolerance and heat intolerance.  Genitourinary:  Negative for difficulty urinating, dyspareunia, dysuria, enuresis, flank pain, frequency, genital sores, hematuria, menstrual problem, pelvic pain and urgency.  Musculoskeletal:  Positive for arthralgias, back pain, gait problem and neck stiffness.  Skin: Negative.   Allergic/Immunologic: Positive for environmental allergies. Negative for food allergies and immunocompromised state.  Neurological:  Positive for speech difficulty, weakness and numbness. Negative for dizziness, tremors, seizures, syncope, facial asymmetry, light-headedness and headaches.  Hematological:  Negative for adenopathy. Does not bruise/bleed easily.  Psychiatric/Behavioral:  Negative for agitation, behavioral problems, confusion, decreased concentration, dysphoric mood, hallucinations, self-injury, sleep disturbance and suicidal ideas. The patient is nervous/anxious. The patient is not hyperactive.     Objective: Vital Signs:  BP 133/76 (BP Location: Left Arm, Patient Position: Sitting)   Pulse 66   Ht 4\' 10"  (1.473 m)   Wt 179 lb 8 oz (81.4 kg)   BMI 37.52 kg/m   Physical Exam Constitutional:      Appearance: She is well-developed.  HENT:     Head: Normocephalic and atraumatic.  Eyes:     Pupils: Pupils are equal, round, and reactive to light.  Pulmonary:     Effort: Pulmonary effort is normal.     Breath sounds: Normal breath sounds.  Abdominal:     General: Bowel sounds are normal.     Palpations: Abdomen is soft.  Musculoskeletal:        General: Normal range of motion.     Cervical back: Normal range of motion and neck supple.  Skin:    General: Skin is warm and dry.  Neurological:     Mental Status: She is alert and oriented to person, place, and time.  Psychiatric:        Behavior: Behavior normal.        Thought Content: Thought content normal.        Judgment: Judgment normal.   Ortho Exam  Specialty Comments:  No specialty comments available.  Imaging: No results found.   PMFS History: Patient Active Problem List   Diagnosis Date Noted   Chronic bilateral low back pain with bilateral sciatica 09/27/2017   Bilateral chronic knee pain 03/13/2017   Pleural effusion 03/10/2013   Hyponatremia 03/10/2013   Hypertension 03/10/2013   Degenerative arthritis of hip 03/08/2013   Past Medical History:  Diagnosis Date   Anxiety    Arthritis    osteoarthritis. spinal stenosis. Scoliosis of spine-degenerative spine.   Bilateral cataracts    Chronic kidney disease    STAGE 4   DDD (degenerative disc disease), lumbar    Dyspnea    GERD (gastroesophageal reflux disease)    controls with Nexium   Grade I diastolic dysfunction 86/76/7209   Noted on ECHO   History of cardiomegaly    History of gallstones    Hypertension    LVH (left ventricular hypertrophy) 05/04/2017   Mil, noted on ECHO   Pre-diabetes    RBBB (right bundle branch block)    Spinal stenosis    Wears partial  dentures     Family History  Problem Relation Age of Onset   Heart disease Father    Heart failure Father    Diabetes Father    Atrial fibrillation Sister     Past Surgical History:  Procedure Laterality Date   CATARACT EXTRACTION Bilateral 03/06/2013   CHOLECYSTECTOMY     COLONOSCOPY     CRYOTHERAPY     DILATION AND CURETTAGE OF UTERUS     HEMORRHOID SURGERY N/A 09/21/2018   Procedure: SINGLE COLUMN HEMORRHOIDECTOMY, HEMORRHOIDPEXY;  Surgeon: Leighton Ruff, MD;  Location: [place];  Service: General;  Laterality: N/A;   TOTAL HIP ARTHROPLASTY Left 03/08/2013   Procedure: LEFT TOTAL HIP ARTHROPLASTY ANTERIOR APPROACH;  Surgeon: Mcarthur Rossetti, MD;  Location: [facility];  Service: Orthopedics;  Laterality: Left;   Social History   Occupational History   Not on file  Tobacco Use   Smoking  status: Never   Smokeless tobacco: Never  Vaping Use   Vaping Use: Never used  Substance and Sexual Activity   Alcohol use: Yes    Comment: OCC   Drug use: Never   Sexual activity: Not Currently    Birth control/protection: Post-menopausal

## 2021-06-03 NOTE — Patient Instructions (Signed)
Plan: Knee is suffering from osteoarthritis, only real proven treatments are Weight loss and exercise. Cymbalta for arthritis pain 20 mg per day. Gabapentin 100mg  po qhs for nerve pain. Well padded shoes help. Ice the knee that is suffering from osteoarthritis, only real proven treatments are  Well padded shoes help. Ice the knee 2-3 times a day 15-20 mins at a time.-3 times a day 15-20 mins at a time. Hot showers in the AM.  Injection with steroid may be of benefit. Hemp CBD capsules, [employer] 5,000-7,000 mg per bottle, 60 capsules per bottle, take one capsule twice a day. Cane in the left hand to use with left leg weight bearing. Follow-Up Instructions: No follow-ups on file.

## 2021-06-16 ENCOUNTER — Ambulatory Visit: Payer: Self-pay

## 2021-06-16 ENCOUNTER — Other Ambulatory Visit: Payer: Self-pay

## 2021-06-16 ENCOUNTER — Encounter: Payer: Self-pay | Admitting: Physical Medicine and Rehabilitation

## 2021-06-16 ENCOUNTER — Ambulatory Visit (INDEPENDENT_AMBULATORY_CARE_PROVIDER_SITE_OTHER): Payer: Medicare Other | Admitting: Physical Medicine and Rehabilitation

## 2021-06-16 ENCOUNTER — Other Ambulatory Visit: Payer: Medicare Other

## 2021-06-16 VITALS — BP 152/77 | HR 60

## 2021-06-16 DIAGNOSIS — M5416 Radiculopathy, lumbar region: Secondary | ICD-10-CM

## 2021-06-16 MED ORDER — METHYLPREDNISOLONE ACETATE 80 MG/ML IJ SUSP
80.0000 mg | Freq: Once | INTRAMUSCULAR | Status: AC
  Administered 2021-06-16: 80 mg

## 2021-06-16 NOTE — Patient Instructions (Signed)

## 2021-06-16 NOTE — Progress Notes (Signed)
Pt state lower back pain. Pt state walking makes the pain worse. Pt state she takes pain meds to help ease her pain. Pt has hx of inj on 09/14/20 pt state  Numeric Pain Rating Scale and Functional Assessment Average Pain 8   In the last MONTH (on 0-10 scale) has pain interfered with the following?  1. General activity like being  able to carry out your everyday physical activities such as walking, climbing stairs, carrying groceries, or moving a chair?  Rating(8)   +Driver, -BT, -Dye Allergies.

## 2021-06-17 NOTE — Procedures (Signed)
Lumbosacral Transforaminal Epidural Steroid Injection - Sub-Pedicular Approach with Fluoroscopic Guidance  Patient: Deborah Jordan      Date of Birth: [birth year] MRN: [id number] PCP: [name], MD      Visit Date: 06/16/2021   Universal Protocol:    Date/Time: 06/16/2021  Consent Given By: the patient  Position: PRONE  Additional Comments: Vital signs were monitored before and after the procedure. Patient was prepped and draped in the usual sterile fashion. The correct patient, procedure, and site was verified.   Injection Procedure Details:   Procedure diagnoses: Lumbar radiculopathy [M54.16]    Meds Administered:  Meds ordered this encounter  Medications   methylPREDNISolone acetate (DEPO-MEDROL) injection 80 mg    Laterality: Bilateral  Location/Site:  L3-L4  Needle:5.0 in., 22 ga.  Short bevel or Quincke spinal needle  Needle Placement: Transforaminal  Findings:    -Comments: Excellent flow of contrast along the nerve, nerve root and into the epidural space.  Procedure Details: After squaring off the end-plates to get a true AP view, the C-arm was positioned so that an oblique view of the foramen as noted above was visualized. The target area is just inferior to the "nose of the scotty dog" or sub pedicular. The soft tissues overlying this structure were infiltrated with 2-3 ml. of 1% Lidocaine without Epinephrine.  The spinal needle was inserted toward the target using a "trajectory" view along the fluoroscope beam.  Under AP and lateral visualization, the needle was advanced so it did not puncture dura and was located close the 6 O'Clock position of the pedical in AP tracterory. Biplanar projections were used to confirm position. Aspiration was confirmed to be negative for CSF and/or blood. A 1-2 ml. volume of Isovue-250 was injected and flow of contrast was noted at each level. Radiographs were obtained for documentation purposes.   After attaining the  desired flow of contrast documented above, a 0.5 to 1.0 ml test dose of 0.25% Marcaine was injected into each respective transforaminal space.  The patient was observed for 90 seconds post injection.  After no sensory deficits were reported, and normal lower extremity motor function was noted,   the above injectate was administered so that equal amounts of the injectate were placed at each foramen (level) into the transforaminal epidural space.   Additional Comments:  The patient tolerated the procedure well Dressing: 2 x 2 sterile gauze and Band-Aid    Post-procedure details: Patient was observed during the procedure. Post-procedure instructions were reviewed.  Patient left the clinic in stable condition.

## 2021-06-17 NOTE — Progress Notes (Signed)
Deborah Jordan - 87 y.o. female MRN [id number]  Date of birth: Nov 16, 1933  Office Visit Note: Visit Date: 06/16/2021 PCP: [name], MD Referred by: [name], MD  Subjective: Chief Complaint  Patient presents with   Lower Back - Pain   HPI:  Deborah Jordan is a 87 y.o. female who comes in today At the request of Dr. Basil Dess for planned left L3 and L4 transforaminal epidural steroid injection.  Patient has severe stenosis at L3-4 and L4-5.  She is having Bilateral complaints of low back and hip pain consistent with claudication.  She reports left side equal to right side.  I took the liberty today of going in completing bilateral L3 transforaminal injections diagnostically and hopefully therapeutically.  She is following up with Dr. Louanne Skye.  Clearly if it becomes more left-sided would be happy to do the L4 position.  Prior bilateral L4 transforaminal injections only offered minimal relief.  She does have pretty severe stenosis.  In terms of her back pain consideration might be given to facet joint blocks if the epidural injections are just not beneficial.  She will continue to continue with all other manner of  ROS Otherwise per HPI.  Assessment & Plan: Visit Diagnoses:    ICD-10-CM   1. Lumbar radiculopathy  M54.16 XR C-ARM NO REPORT    Epidural Steroid injection    methylPREDNISolone acetate (DEPO-MEDROL) injection 80 mg      Plan: No additional findings.   Meds & Orders:  Meds ordered this encounter  Medications   methylPREDNISolone acetate (DEPO-MEDROL) injection 80 mg    Orders Placed This Encounter  Procedures   XR C-ARM NO REPORT   Epidural Steroid injection    Follow-up: Return for Basil Dess, MD.   Procedures: No procedures performed  Lumbosacral Transforaminal Epidural Steroid Injection - Sub-Pedicular Approach with Fluoroscopic Guidance  Patient: Deborah Jordan      Date of Birth: 01-14-1933 MRN: [id number] PCP: [name], MD      Visit  Date: 06/16/2021   Universal Protocol:    Date/Time: 06/16/2021  Consent Given By: the patient  Position: PRONE  Additional Comments: Vital signs were monitored before and after the procedure. Patient was prepped and draped in the usual sterile fashion. The correct patient, procedure, and site was verified.   Injection Procedure Details:   Procedure diagnoses: Lumbar radiculopathy [M54.16]    Meds Administered:  Meds ordered this encounter  Medications   methylPREDNISolone acetate (DEPO-MEDROL) injection 80 mg    Laterality: Bilateral  Location/Site:  L3-L4  Needle:5.0 in., 22 ga.  Short bevel or Quincke spinal needle  Needle Placement: Transforaminal  Findings:    -Comments: Excellent flow of contrast along the nerve, nerve root and into the epidural space.  Procedure Details: After squaring off the end-plates to get a true AP view, the C-arm was positioned so that an oblique view of the foramen as noted above was visualized. The target area is just inferior to the "nose of the scotty dog" or sub pedicular. The soft tissues overlying this structure were infiltrated with 2-3 ml. of 1% Lidocaine without Epinephrine.  The spinal needle was inserted toward the target using a "trajectory" view along the fluoroscope beam.  Under AP and lateral visualization, the needle was advanced so it did not puncture dura and was located close the 6 O'Clock position of the pedical in AP tracterory. Biplanar projections were used to confirm position. Aspiration was confirmed to be negative for CSF and/or  blood. A 1-2 ml. volume of Isovue-250 was injected and flow of contrast was noted at each level. Radiographs were obtained for documentation purposes.   After attaining the desired flow of contrast documented above, a 0.5 to 1.0 ml test dose of 0.25% Marcaine was injected into each respective transforaminal space.  The patient was observed for 90 seconds post injection.  After no sensory  deficits were reported, and normal lower extremity motor function was noted,   the above injectate was administered so that equal amounts of the injectate were placed at each foramen (level) into the transforaminal epidural space.   Additional Comments:  The patient tolerated the procedure well Dressing: 2 x 2 sterile gauze and Band-Aid    Post-procedure details: Patient was observed during the procedure. Post-procedure instructions were reviewed.  Patient left the clinic in stable condition.    Clinical History: MRI LUMBAR SPINE WITHOUT CONTRAST   TECHNIQUE: Multiplanar, multisequence MR imaging of the lumbar spine was performed. No intravenous contrast was administered.   COMPARISON:  MRI lumbar spine 10/18/2017   FINDINGS: Segmentation:  Normal   Alignment: Mild to moderate dextroscoliosis. Normal sagittal alignment.   Vertebrae: Negative for fracture or mass. Discogenic bone marrow edema is seen at multiple levels most prominent L1-2, L2-3, L3-4, L4-5.   Conus medullaris and cauda equina: Conus extends to the L2-3 level. Conus and cauda equina appear normal. 1 mm syrinx in the distal spinal cord at the L1 level is unchanged from 2018.   Paraspinal and other soft tissues: Negative for paraspinous mass, adenopathy, or fluid collection.   Disc levels:   T12-L1: Disc degeneration with disc bulging and diffuse endplate spurring. Subarticular and foraminal stenosis bilaterally. Spinal canal normal in size.   L1-2: Asymmetric disc degeneration and spurring on the right. Moderate facet degeneration. Mild to moderate spinal stenosis. Moderate subarticular and foraminal stenosis bilaterally right greater than left   L2-3: Disc degeneration with disc bulging and diffuse endplate spurring. Moderate facet degeneration. Moderate spinal stenosis. Moderate subarticular and foraminal stenosis bilaterally. Mild progression of degenerative change and stenosis.   L3-4: Disc  degeneration with diffuse disc bulging and endplate spurring. Moderate facet and ligamentum flavum hypertrophy. Severe spinal stenosis. Moderate subarticular stenosis bilaterally. Mild foraminal narrowing bilaterally. Progressive degenerative change and stenosis.   L4-5: Disc degeneration with diffuse disc bulging and endplate spurring. Moderate to severe facet degeneration. Severe spinal stenosis. Severe subarticular stenosis bilaterally with bilateral L5 nerve root impingement. Moderate foraminal narrowing bilaterally. No interval change from the prior study.   L5-S1: Mild disc bulging. Moderate facet degeneration. Mild subarticular and foraminal stenosis on the right.   IMPRESSION: Multilevel degenerative change. Lumbar scoliosis. Negative for fracture   Multilevel spinal and foraminal stenosis throughout the lumbar spine as detailed above. Progressive stenosis at multiple levels compared with 2018 MRI.     Electronically Signed   By: Franchot Gallo M.D.   On: 05/14/2021 10:25     Objective:  VS:  HT:    WT:   BMI:     BP:(!) 152/77  HR:60bpm  TEMP: ( )  RESP:  Physical Exam Vitals and nursing note reviewed.  Constitutional:      General: She is not in acute distress.    Appearance: Normal appearance. She is obese. She is not ill-appearing.  HENT:     Head: Normocephalic and atraumatic.     Right Ear: External ear normal.     Left Ear: External ear normal.  Eyes:  Extraocular Movements: Extraocular movements intact.  Cardiovascular:     Rate and Rhythm: Normal rate.     Pulses: Normal pulses.  Pulmonary:     Effort: Pulmonary effort is normal. No respiratory distress.  Abdominal:     General: There is no distension.     Palpations: Abdomen is soft.  Musculoskeletal:        General: Tenderness present.     Cervical back: Neck supple.     Right lower leg: No edema.     Left lower leg: No edema.     Comments: Patient has good distal strength with no  pain over the greater trochanters.  No clonus or focal weakness.  Skin:    Findings: No erythema, lesion or rash.  Neurological:     General: No focal deficit present.     Mental Status: She is alert and oriented to person, place, and time.     Sensory: No sensory deficit.     Motor: No weakness or abnormal muscle tone.     Coordination: Coordination normal.     Gait: Gait abnormal.  Psychiatric:        Mood and Affect: Mood normal.        Behavior: Behavior normal.     Imaging: XR C-ARM NO REPORT  Result Date: 06/16/2021 Please see Notes tab for imaging impression.

## 2021-06-18 ENCOUNTER — Inpatient Hospital Stay (HOSPITAL_BASED_OUTPATIENT_CLINIC_OR_DEPARTMENT_OTHER)
Admission: EM | Admit: 2021-06-18 | Discharge: 2021-06-22 | DRG: 641 | Disposition: A | Discharge status: Home / Self Care | Payer: Medicare Other | Attending: Internal Medicine | Admitting: Internal Medicine

## 2021-06-18 ENCOUNTER — Other Ambulatory Visit: Payer: Self-pay

## 2021-06-18 DIAGNOSIS — Z888 Allergy status to other drugs, medicaments and biological substances status: Secondary | ICD-10-CM | POA: Diagnosis not present

## 2021-06-18 DIAGNOSIS — I4510 Unspecified right bundle-branch block: Secondary | ICD-10-CM | POA: Diagnosis present

## 2021-06-18 DIAGNOSIS — I5032 Chronic diastolic (congestive) heart failure: Secondary | ICD-10-CM | POA: Diagnosis present

## 2021-06-18 DIAGNOSIS — Z79899 Other long term (current) drug therapy: Secondary | ICD-10-CM | POA: Diagnosis not present

## 2021-06-18 DIAGNOSIS — R0689 Other abnormalities of breathing: Secondary | ICD-10-CM | POA: Diagnosis not present

## 2021-06-18 DIAGNOSIS — M5441 Lumbago with sciatica, right side: Secondary | ICD-10-CM | POA: Diagnosis present

## 2021-06-18 DIAGNOSIS — K529 Noninfective gastroenteritis and colitis, unspecified: Secondary | ICD-10-CM | POA: Diagnosis present

## 2021-06-18 DIAGNOSIS — Z20822 Contact with and (suspected) exposure to covid-19: Secondary | ICD-10-CM | POA: Diagnosis not present

## 2021-06-18 DIAGNOSIS — R5382 Chronic fatigue, unspecified: Secondary | ICD-10-CM | POA: Diagnosis not present

## 2021-06-18 DIAGNOSIS — E785 Hyperlipidemia, unspecified: Secondary | ICD-10-CM | POA: Diagnosis not present

## 2021-06-18 DIAGNOSIS — Z96642 Presence of left artificial hip joint: Secondary | ICD-10-CM | POA: Diagnosis not present

## 2021-06-18 DIAGNOSIS — N189 Chronic kidney disease, unspecified: Secondary | ICD-10-CM | POA: Diagnosis present

## 2021-06-18 DIAGNOSIS — R5381 Other malaise: Secondary | ICD-10-CM | POA: Diagnosis not present

## 2021-06-18 DIAGNOSIS — M48061 Spinal stenosis, lumbar region without neurogenic claudication: Secondary | ICD-10-CM | POA: Diagnosis present

## 2021-06-18 DIAGNOSIS — I130 Hypertensive heart and chronic kidney disease with heart failure and stage 1 through stage 4 chronic kidney disease, or unspecified chronic kidney disease: Secondary | ICD-10-CM | POA: Diagnosis present

## 2021-06-18 DIAGNOSIS — K219 Gastro-esophageal reflux disease without esophagitis: Secondary | ICD-10-CM | POA: Diagnosis present

## 2021-06-18 DIAGNOSIS — M5442 Lumbago with sciatica, left side: Secondary | ICD-10-CM | POA: Diagnosis present

## 2021-06-18 DIAGNOSIS — E871 Hypo-osmolality and hyponatremia: Principal | ICD-10-CM

## 2021-06-18 DIAGNOSIS — Z8249 Family history of ischemic heart disease and other diseases of the circulatory system: Secondary | ICD-10-CM

## 2021-06-18 DIAGNOSIS — Z833 Family history of diabetes mellitus: Secondary | ICD-10-CM

## 2021-06-18 DIAGNOSIS — R0600 Dyspnea, unspecified: Secondary | ICD-10-CM

## 2021-06-18 DIAGNOSIS — R1310 Dysphagia, unspecified: Secondary | ICD-10-CM | POA: Diagnosis not present

## 2021-06-18 DIAGNOSIS — R0602 Shortness of breath: Secondary | ICD-10-CM | POA: Diagnosis not present

## 2021-06-18 DIAGNOSIS — R053 Chronic cough: Secondary | ICD-10-CM | POA: Diagnosis present

## 2021-06-18 DIAGNOSIS — F419 Anxiety disorder, unspecified: Secondary | ICD-10-CM | POA: Diagnosis present

## 2021-06-18 DIAGNOSIS — M1990 Unspecified osteoarthritis, unspecified site: Secondary | ICD-10-CM | POA: Diagnosis not present

## 2021-06-18 LAB — CBC WITH DIFFERENTIAL/PLATELET
Abs Immature Granulocytes: 0.13 10*3/uL — ABNORMAL HIGH (ref 0.00–0.07)
Basophils Absolute: 0 10*3/uL (ref 0.0–0.1)
Basophils Relative: 0 %
Eosinophils Absolute: 0 10*3/uL (ref 0.0–0.5)
Eosinophils Relative: 0 %
HCT: 35.8 % — ABNORMAL LOW (ref 36.0–46.0)
Hemoglobin: 13.1 g/dL (ref 12.0–15.0)
Immature Granulocytes: 1 %
Lymphocytes Relative: 12 %
Lymphs Abs: 1.5 10*3/uL (ref 0.7–4.0)
MCH: 28.9 pg (ref 26.0–34.0)
MCHC: 36.6 g/dL — ABNORMAL HIGH (ref 30.0–36.0)
MCV: 78.9 fL — ABNORMAL LOW (ref 80.0–100.0)
Monocytes Absolute: 1.4 10*3/uL — ABNORMAL HIGH (ref 0.1–1.0)
Monocytes Relative: 11 %
Neutro Abs: 9.8 10*3/uL — ABNORMAL HIGH (ref 1.7–7.7)
Neutrophils Relative %: 76 %
Platelets: 408 10*3/uL — ABNORMAL HIGH (ref 150–400)
RBC: 4.54 MIL/uL (ref 3.87–5.11)
RDW: 11.9 % (ref 11.5–15.5)
WBC: 12.8 10*3/uL — ABNORMAL HIGH (ref 4.0–10.5)
nRBC: 0 % (ref 0.0–0.2)

## 2021-06-18 LAB — BASIC METABOLIC PANEL
Anion gap: 11 (ref 5–15)
BUN: 18 mg/dL (ref 8–23)
CO2: 22 mmol/L (ref 22–32)
Calcium: 9.2 mg/dL (ref 8.9–10.3)
Chloride: 78 mmol/L — ABNORMAL LOW (ref 98–111)
Creatinine, Ser: 0.78 mg/dL (ref 0.44–1.00)
GFR, Estimated: >60 mL/min (ref >60)
Glucose, Bld: 115 mg/dL — ABNORMAL HIGH (ref 70–99)
Potassium: 3.9 mmol/L (ref 3.5–5.1)
Sodium: 111 mmol/L — CL (ref 135–145)

## 2021-06-18 LAB — RESP PANEL BY RT-PCR (FLU A&B, COVID) ARPGX2
Influenza A by PCR: NEGATIVE
Influenza B by PCR: NEGATIVE
SARS Coronavirus 2 by RT PCR: NEGATIVE

## 2021-06-18 MED ORDER — CARVEDILOL 12.5 MG PO TABS
12.5000 mg | ORAL_TABLET | Freq: Two times a day (BID) | ORAL | Status: DC
  Administered 2021-06-18 – 2021-06-22 (×6): 12.5 mg via ORAL
  Filled 2021-06-18 (×7): qty 1

## 2021-06-18 MED ORDER — CARVEDILOL 12.5 MG PO TABS: 12.5000 mg | ORAL_TABLET | Freq: Two times a day (BID) | ORAL | Status: DC

## 2021-06-18 MED ORDER — SODIUM CHLORIDE 0.9 % IV BOLUS
1000.0000 mL | Freq: Once | INTRAVENOUS | Status: AC
  Administered 2021-06-18: 1000 mL via INTRAVENOUS

## 2021-06-18 MED ORDER — ACETAMINOPHEN 325 MG PO TABS
650.0000 mg | ORAL_TABLET | Freq: Once | ORAL | Status: AC
  Administered 2021-06-18: 650 mg via ORAL
  Filled 2021-06-18: qty 2

## 2021-06-18 NOTE — H&P (Signed)
NAME:  Deborah Jordan, MRN:  [id number], DOB:  11-28-1933, LOS: 0 ADMISSION DATE:  06/18/2021, CONSULTATION DATE:  06/19/21 REFERRING MD:  EDP , CHIEF COMPLAINT:  Hyponatremia  History of Present Illness:  This is a 87 yo female with history of HTN. HLD, CKD, bifasicular block who presented to primary physicain on 06/18/21 for generalized fatigue. Was told by pcpc to go to ED. She presents to ED today and was noted to be Aox4. There was no history of seizure or changes in balance. Overall she seems like herself. No bad headaches. No fevers. No chills. She has had chornic mild cough. She notes its more reflux than anything else. She did note some nausea and difficulty swallowing from timt to time. Because of this she has not been eating much. She has just been taking mostly water. She drinks about 8-10 cups a day. She has been taking her home meds daily which include norvasc, coreg, atorvastatin and a PPI.  No more swelling in extremitites than usual. No problems voiding. No fevers. No chills.   Hx of low NA in past (125-128), never this low.  IN ED wokrup pertinent for sodium of 111.   Pertinent  Medical History  Lumbar radiculopathy, lumbar stenosis L 3-4 and L 4-5 S/p transforaminal LS epidural steroid injection  Anxiety Arthritis GERD Grade 1 diastolic dysfunction (HFpEF) HTN RBBB  Significant Hospital Events: Including procedures, antibiotic start and stop dates in addition to other pertinent events   Admitted to ICU for monitoring  Interim History / Subjective:  NA  Objective   Blood pressure (!) 170/64, pulse 66, temperature 97.8 F (36.6 C), temperature source Oral, resp. rate (!) 22, height 4\' 11"  (1.499 m), weight 83 kg, SpO2 96 %.       No intake or output data in the 24 hours ending 06/18/21 2328 Filed Weights   06/18/21 2029  Weight: 83 kg    Examination: General: Patient alert and oriented x3. In no distress HENT: Moist mucous membranes Lungs: CTAB. No increased WOB  noted Cardiovascular: RRR-no rubs or murmurs.  Abdomen: Soft non tender and non distended Extremities: warm to touch minimal edema noted.  Neuro: Grossly intact moving all extremitites symetrcially no weakness appreciated.  GU: Deferred  Resolved Hospital Problem list   NA  Assessment & Plan:  This is a 87 yo with history as noted above who presents for chief complain of malaise found to be hyponatremic   Hyponatremia-Appears euvolemic on exam. Could be 2/2 poor solute intake vs SIADH. No acute symptoms. Does not need to to be aggressively corrected. Has some nausea/dysphagia that may need to be further investigated if continues to be a problem -Will volume restrict to 1 liter a day -Add salt water tabs -TSH, Cortisol, BNP -urine som, serum osm. Urine na  HTN-Continue home norvasc, Hold home coreg for now  Anxiety- -Home xanax 0.25 TID PRN    Best Practice (right click and "Reselect all SmartList Selections" daily)   Diet/type: NPO DVT prophylaxis: LMWH GI prophylaxis: Home PPI is nexium. Protonix here Lines: N/A Foley:  N/A Code Status:  full code Last date of multidisciplinary goals of care discussion [Spoke with patient regarding code status on 06/19/21]  Labs   CBC: Recent Labs  Lab 06/18/21 2039  WBC 12.8*  NEUTROABS 9.8*  HGB 13.1  HCT 35.8*  MCV 78.9*  PLT 408*    Basic Metabolic Panel: Recent Labs  Lab 06/18/21 2039  NA 111*  K 3.9  CL 78*  CO2 22  GLUCOSE 115*  BUN 18  CREATININE 0.78  CALCIUM 9.2   GFR: Estimated Creatinine Clearance: 47.1 mL/min (by C-G formula based on SCr of 0.78 mg/dL). Recent Labs  Lab 06/18/21 2039  WBC 12.8*    Liver Function Tests: No results for input(s): AST, ALT, ALKPHOS, BILITOT, PROT, ALBUMIN in the last 168 hours. No results for input(s): LIPASE, AMYLASE in the last 168 hours. No results for input(s): AMMONIA in the last 168 hours.  ABG No results found for: PHART, PCO2ART, PO2ART, HCO3, TCO2,  ACIDBASEDEF, O2SAT   Coagulation Profile: No results for input(s): INR, PROTIME in the last 168 hours.  Cardiac Enzymes: No results for input(s): CKTOTAL, CKMB, CKMBINDEX, TROPONINI in the last 168 hours.  HbA1C: No results found for: HGBA1C  CBG: No results for input(s): GLUCAP in the last 168 hours.  Review of Systems:   Pertinent positives and negatives per HPI. Otherwise all other ROS negative.   Past Medical History:  She,  has a past medical history of Anxiety, Arthritis, Bilateral cataracts, Chronic kidney disease, DDD (degenerative disc disease), lumbar, Dyspnea, GERD (gastroesophageal reflux disease), Grade I diastolic dysfunction (50/08/3817), History of cardiomegaly, History of gallstones, Hypertension, LVH (left ventricular hypertrophy) (05/04/2017), Pre-diabetes, RBBB (right bundle branch block), Spinal stenosis, and Wears partial dentures.   Surgical History:   Past Surgical History:  Procedure Laterality Date   CATARACT EXTRACTION Bilateral 03/06/2013   CHOLECYSTECTOMY     COLONOSCOPY     CRYOTHERAPY     DILATION AND CURETTAGE OF UTERUS     HEMORRHOID SURGERY N/A 09/21/2018   Procedure: SINGLE COLUMN HEMORRHOIDECTOMY, HEMORRHOIDPEXY;  Surgeon: Leighton Ruff, MD;  Location: [facility];  Service: General;  Laterality: N/A;   TOTAL HIP ARTHROPLASTY Left 03/08/2013   Procedure: LEFT TOTAL HIP ARTHROPLASTY ANTERIOR APPROACH;  Surgeon: Mcarthur Rossetti, MD;  Location: [facility];  Service: Orthopedics;  Laterality: Left;     Social History:   reports that she has never smoked. She has never used smokeless tobacco. She reports current alcohol use. She reports that she does not use drugs.   Family History:  Her family history includes Atrial fibrillation in her sister; Diabetes in her father; Heart disease in her father; Heart failure in her father.   Allergies Allergies  Allergen Reactions   Ace Inhibitors Cough     Home Medications  Prior to  Admission medications   Medication Sig Start Date End Date Taking? Authorizing Provider  acetaminophen (TYLENOL) 650 MG CR tablet Take 650 mg by mouth every 8 (eight) hours as needed for pain.    [provider]  amLODipine (NORVASC) 10 MG tablet Take 10 mg by mouth daily.    [provider]  atorvastatin (LIPITOR) 10 MG tablet Take 10 mg by mouth daily.    [provider]  carvedilol (COREG) 12.5 MG tablet Take 12.5 mg by mouth 2 (two) times daily with a meal.    [provider]  Cholecalciferol (VITAMIN D) 2000 units CAPS Take 2,000 Units by mouth daily.    [provider]  diazepam (VALIUM) 2 MG tablet Take 1 tablet 1 hour prior to MRI and may repeat 15 minutes if needed.  MUST HAVE SOMEONE TO DRIVE. 05/04/21   Lanae Crumbly, PA-C  DULoxetine (CYMBALTA) 20 MG capsule Take 1 capsule (20 mg total) by mouth daily. 06/03/21   Jessy Oto, MD  esomeprazole (NEXIUM) 40 MG capsule Take 40 mg by mouth daily.  [provider]  gabapentin (NEURONTIN) 100 MG capsule Take 1 capsule (100 mg total) by mouth at bedtime. 06/03/21   Jessy Oto, MD  loratadine (CLARITIN) 10 MG tablet Take 10 mg by mouth daily as needed.    [provider]     Critical care time: 45 minutes

## 2021-06-18 NOTE — ED Triage Notes (Signed)
Pt to Ed from home with c/o abnormal labs after pt had bloodwork drawn today by primary care. Pt was seated at dinner table when the lab called her and stated that her Na+ was 113 and she needed to come to the ER. Pt is AAOx4 and has no c/o Nuero symptoms at this time.

## 2021-06-18 NOTE — ED Notes (Signed)
Called [facility] to transport patient to [facility] [id number] [id number]

## 2021-06-18 NOTE — ED Provider Notes (Signed)
[facility] EMERGENCY DEPT Provider Note   CSN: [id number] Arrival date & time: 06/18/21  2009     History Chief Complaint  Patient presents with   Abnormal Lab    Deborah Jordan is a 87 y.o. female.  Patient had complained of some generalized malaise ongoing for 2 to 3 days.  She saw her primary care doctor had blood drawn today and received a phone call telling her to go to the ER for low sodium.  She otherwise denies any fevers or cough.  No vomiting or diarrhea.  Denies any headache or seizure-like activity.  States that she is had low sodium before in the 1 25-1 28 range but never so low as today.      Past Medical History:  Diagnosis Date   Anxiety    Arthritis    osteoarthritis. spinal stenosis. Scoliosis of spine-degenerative spine.   Bilateral cataracts    Chronic kidney disease    STAGE 4   DDD (degenerative disc disease), lumbar    Dyspnea    GERD (gastroesophageal reflux disease)    controls with Nexium   Grade I diastolic dysfunction 74/25/9563   Noted on ECHO   History of cardiomegaly    History of gallstones    Hypertension    LVH (left ventricular hypertrophy) 05/04/2017   Mil, noted on ECHO   Pre-diabetes    RBBB (right bundle branch block)    Spinal stenosis    Wears partial dentures     Patient Active Problem List   Diagnosis Date Noted   Chronic bilateral low back pain with bilateral sciatica 09/27/2017   Bilateral chronic knee pain 03/13/2017   Pleural effusion 03/10/2013   Hyponatremia 03/10/2013   Hypertension 03/10/2013   Degenerative arthritis of hip 03/08/2013    Past Surgical History:  Procedure Laterality Date   CATARACT EXTRACTION Bilateral 03/06/2013   CHOLECYSTECTOMY     COLONOSCOPY     CRYOTHERAPY     DILATION AND CURETTAGE OF UTERUS     HEMORRHOID SURGERY N/A 09/21/2018   Procedure: SINGLE COLUMN HEMORRHOIDECTOMY, HEMORRHOIDPEXY;  Surgeon: Leighton Ruff, MD;  Location: [facility];  Service:  General;  Laterality: N/A;   TOTAL HIP ARTHROPLASTY Left 03/08/2013   Procedure: LEFT TOTAL HIP ARTHROPLASTY ANTERIOR APPROACH;  Surgeon: Mcarthur Rossetti, MD;  Location: [facility];  Service: Orthopedics;  Laterality: Left;     OB History   No obstetric history on file.     Family History  Problem Relation Age of Onset   Heart disease Father    Heart failure Father    Diabetes Father    Atrial fibrillation Sister     Social History   Tobacco Use   Smoking status: Never   Smokeless tobacco: Never  Vaping Use   Vaping Use: Never used  Substance Use Topics   Alcohol use: Yes    Comment: OCC   Drug use: Never    Home Medications Prior to Admission medications   Medication Sig Start Date End Date Taking? Authorizing Provider  acetaminophen (TYLENOL) 650 MG CR tablet Take 650 mg by mouth every 8 (eight) hours as needed for pain.    [provider]  amLODipine (NORVASC) 10 MG tablet Take 10 mg by mouth daily.    [provider]  atorvastatin (LIPITOR) 10 MG tablet Take 10 mg by mouth daily.    [provider]  carvedilol (COREG) 12.5 MG tablet Take 12.5 mg by mouth 2 (two) times daily  with a meal.    [provider]  Cholecalciferol (VITAMIN D) 2000 units CAPS Take 2,000 Units by mouth daily.    [provider]  diazepam (VALIUM) 2 MG tablet Take 1 tablet 1 hour prior to MRI and may repeat 15 minutes if needed.  MUST HAVE SOMEONE TO DRIVE. 05/04/21   Lanae Crumbly, PA-C  DULoxetine (CYMBALTA) 20 MG capsule Take 1 capsule (20 mg total) by mouth daily. 06/03/21   Jessy Oto, MD  esomeprazole (NEXIUM) 40 MG capsule Take 40 mg by mouth daily.    [provider]  gabapentin (NEURONTIN) 100 MG capsule Take 1 capsule (100 mg total) by mouth at bedtime. 06/03/21   Jessy Oto, MD  loratadine (CLARITIN) 10 MG tablet Take 10 mg by mouth daily as needed.    [provider]    Allergies    Ace inhibitors  Review of  Systems   Review of Systems  Constitutional:  Negative for fever.  HENT:  Negative for ear pain.   Eyes:  Negative for pain.  Respiratory:  Negative for cough.   Cardiovascular:  Negative for chest pain.  Gastrointestinal:  Negative for abdominal pain.  Genitourinary:  Negative for flank pain.  Musculoskeletal:  Negative for back pain.  Skin:  Negative for rash.  Neurological:  Negative for headaches.   Physical Exam Updated Vital Signs BP (!) 159/71 (BP Location: Right Arm)   Pulse 65   Temp 97.8 F (36.6 C) (Oral)   Resp 18   Ht 4\' 11"  (1.499 m)   Wt 83 kg   SpO2 97%   BMI 36.96 kg/m   Physical Exam Constitutional:      General: She is not in acute distress.    Appearance: Normal appearance.  HENT:     Head: Normocephalic.     Nose: Nose normal.  Eyes:     Extraocular Movements: Extraocular movements intact.  Cardiovascular:     Rate and Rhythm: Normal rate.  Pulmonary:     Effort: Pulmonary effort is normal.  Musculoskeletal:        General: Normal range of motion.     Cervical back: Normal range of motion.  Neurological:     General: No focal deficit present.     Mental Status: She is alert. Mental status is at baseline.    ED Results / Procedures / Treatments   Labs (all labs ordered are listed, but only abnormal results are displayed) Labs Reviewed  CBC WITH DIFFERENTIAL/PLATELET - Abnormal; Notable for the following components:      Result Value   WBC 12.8 (*)    HCT 35.8 (*)    MCV 78.9 (*)    MCHC 36.6 (*)    Platelets 408 (*)    Neutro Abs 9.8 (*)    Monocytes Absolute 1.4 (*)    Abs Immature Granulocytes 0.13 (*)    All other components within normal limits  BASIC METABOLIC PANEL - Abnormal; Notable for the following components:   Sodium 111 (*)    Chloride 78 (*)    Glucose, Bld 115 (*)    All other components within normal limits  RESP PANEL BY RT-PCR (FLU A&B, COVID) [id number]    EKG None  Radiology No results  found.  Procedures .Critical Care  Date/Time: 06/18/2021 9:22 PM Performed by: Luna Fuse, MD Authorized by: Luna Fuse, MD   Critical care provider statement:    Critical care time (minutes):  30  Critical care was necessary to treat or prevent imminent or life-threatening deterioration of the following conditions:  Metabolic crisis   Medications Ordered in ED Medications  sodium chloride 0.9 % bolus 1,000 mL (has no administration in time range)    ED Course  I have reviewed the triage vital signs and the nursing notes.  Pertinent labs & imaging results that were available during my care of the patient were reviewed by me and considered in my medical decision making (see chart for details).    MDM Rules/Calculators/A&P                          Repeat labs show severe hyponatremia of 111.  Patient is otherwise neuro intact awake and alert.  Patient given a liter bolus of IV fluid hydration.  Will be admitted to the hospitalist team.  Final Clinical Impression(s) / ED Diagnoses Final diagnoses:  Hyponatremia    Rx / DC Orders ED Discharge Orders     None        Luna Fuse, MD 06/18/21 2123

## 2021-06-18 NOTE — Progress Notes (Signed)
Received a call from Dr. Almyra Free, EDP, regarding admission of Ms. Deborah Jordan to the hospitalist team, [facility], for severe hyponatremia with serum sodium of 111.  Patient initially presented to her PCP with complaints of generalized malaise ongoing for 2 to 3 days.  Blood drawn revealed severe hyponatremia and was advised to go to the ED.  Patient will likely require ICU admission for 3% saline infusion.  Requested that EDP, Dr. Almyra Free, contact PCCM for admission for severe hyponatremia.    Admission to [facility] service deferred at this time.

## 2021-06-19 ENCOUNTER — Encounter (HOSPITAL_COMMUNITY): Payer: Self-pay | Admitting: Pulmonary Disease

## 2021-06-19 ENCOUNTER — Inpatient Hospital Stay (HOSPITAL_COMMUNITY): Payer: Medicare Other

## 2021-06-19 DIAGNOSIS — R0600 Dyspnea, unspecified: Secondary | ICD-10-CM

## 2021-06-19 DIAGNOSIS — R1310 Dysphagia, unspecified: Secondary | ICD-10-CM | POA: Diagnosis present

## 2021-06-19 DIAGNOSIS — I4510 Unspecified right bundle-branch block: Secondary | ICD-10-CM | POA: Diagnosis present

## 2021-06-19 DIAGNOSIS — N189 Chronic kidney disease, unspecified: Secondary | ICD-10-CM | POA: Diagnosis present

## 2021-06-19 DIAGNOSIS — R0689 Other abnormalities of breathing: Secondary | ICD-10-CM | POA: Diagnosis not present

## 2021-06-19 DIAGNOSIS — K219 Gastro-esophageal reflux disease without esophagitis: Secondary | ICD-10-CM | POA: Diagnosis present

## 2021-06-19 DIAGNOSIS — M5442 Lumbago with sciatica, left side: Secondary | ICD-10-CM | POA: Diagnosis present

## 2021-06-19 DIAGNOSIS — Z833 Family history of diabetes mellitus: Secondary | ICD-10-CM | POA: Diagnosis not present

## 2021-06-19 DIAGNOSIS — R053 Chronic cough: Secondary | ICD-10-CM | POA: Diagnosis present

## 2021-06-19 DIAGNOSIS — M5441 Lumbago with sciatica, right side: Secondary | ICD-10-CM | POA: Diagnosis present

## 2021-06-19 DIAGNOSIS — F419 Anxiety disorder, unspecified: Secondary | ICD-10-CM | POA: Diagnosis present

## 2021-06-19 DIAGNOSIS — E871 Hypo-osmolality and hyponatremia: Secondary | ICD-10-CM | POA: Diagnosis not present

## 2021-06-19 DIAGNOSIS — R5381 Other malaise: Secondary | ICD-10-CM | POA: Diagnosis present

## 2021-06-19 DIAGNOSIS — K529 Noninfective gastroenteritis and colitis, unspecified: Secondary | ICD-10-CM | POA: Diagnosis present

## 2021-06-19 DIAGNOSIS — Z96642 Presence of left artificial hip joint: Secondary | ICD-10-CM | POA: Diagnosis present

## 2021-06-19 DIAGNOSIS — E785 Hyperlipidemia, unspecified: Secondary | ICD-10-CM | POA: Diagnosis present

## 2021-06-19 DIAGNOSIS — I517 Cardiomegaly: Secondary | ICD-10-CM | POA: Diagnosis not present

## 2021-06-19 DIAGNOSIS — I130 Hypertensive heart and chronic kidney disease with heart failure and stage 1 through stage 4 chronic kidney disease, or unspecified chronic kidney disease: Secondary | ICD-10-CM | POA: Diagnosis present

## 2021-06-19 DIAGNOSIS — I5032 Chronic diastolic (congestive) heart failure: Secondary | ICD-10-CM | POA: Diagnosis present

## 2021-06-19 DIAGNOSIS — M1990 Unspecified osteoarthritis, unspecified site: Secondary | ICD-10-CM | POA: Diagnosis present

## 2021-06-19 DIAGNOSIS — Z8249 Family history of ischemic heart disease and other diseases of the circulatory system: Secondary | ICD-10-CM | POA: Diagnosis not present

## 2021-06-19 DIAGNOSIS — Z20822 Contact with and (suspected) exposure to covid-19: Secondary | ICD-10-CM | POA: Diagnosis present

## 2021-06-19 DIAGNOSIS — Z79899 Other long term (current) drug therapy: Secondary | ICD-10-CM | POA: Diagnosis not present

## 2021-06-19 DIAGNOSIS — M48061 Spinal stenosis, lumbar region without neurogenic claudication: Secondary | ICD-10-CM | POA: Diagnosis present

## 2021-06-19 DIAGNOSIS — J811 Chronic pulmonary edema: Secondary | ICD-10-CM | POA: Diagnosis not present

## 2021-06-19 DIAGNOSIS — Z888 Allergy status to other drugs, medicaments and biological substances status: Secondary | ICD-10-CM | POA: Diagnosis not present

## 2021-06-19 DIAGNOSIS — R0602 Shortness of breath: Secondary | ICD-10-CM | POA: Diagnosis not present

## 2021-06-19 LAB — CBC WITH DIFFERENTIAL/PLATELET
Abs Immature Granulocytes: 0.1 10*3/uL — ABNORMAL HIGH (ref 0.00–0.07)
Basophils Absolute: 0 10*3/uL (ref 0.0–0.1)
Basophils Relative: 0 %
Eosinophils Absolute: 0.1 10*3/uL (ref 0.0–0.5)
Eosinophils Relative: 1 %
HCT: 33.1 % — ABNORMAL LOW (ref 36.0–46.0)
Hemoglobin: 12.2 g/dL (ref 12.0–15.0)
Immature Granulocytes: 1 %
Lymphocytes Relative: 12 %
Lymphs Abs: 1.3 10*3/uL (ref 0.7–4.0)
MCH: 29.2 pg (ref 26.0–34.0)
MCHC: 36.9 g/dL — ABNORMAL HIGH (ref 30.0–36.0)
MCV: 79.2 fL — ABNORMAL LOW (ref 80.0–100.0)
Monocytes Absolute: 1.2 10*3/uL — ABNORMAL HIGH (ref 0.1–1.0)
Monocytes Relative: 11 %
Neutro Abs: 8.1 10*3/uL — ABNORMAL HIGH (ref 1.7–7.7)
Neutrophils Relative %: 75 %
Platelets: 394 10*3/uL (ref 150–400)
RBC: 4.18 MIL/uL (ref 3.87–5.11)
RDW: 11.7 % (ref 11.5–15.5)
WBC: 10.7 10*3/uL — ABNORMAL HIGH (ref 4.0–10.5)
nRBC: 0 % (ref 0.0–0.2)

## 2021-06-19 LAB — PROTIME-INR
INR: 1.1 (ref 0.8–1.2)
Prothrombin Time: 14 seconds (ref 11.4–15.2)

## 2021-06-19 LAB — TSH: TSH: 1.872 u[IU]/mL (ref 0.350–4.500)

## 2021-06-19 LAB — COMPREHENSIVE METABOLIC PANEL
ALT: 15 U/L (ref 0–44)
AST: 19 U/L (ref 15–41)
Albumin: 3.5 g/dL (ref 3.5–5.0)
Alkaline Phosphatase: 65 U/L (ref 38–126)
Anion gap: 9 (ref 5–15)
BUN: 14 mg/dL (ref 8–23)
CO2: 22 mmol/L (ref 22–32)
Calcium: 8.6 mg/dL — ABNORMAL LOW (ref 8.9–10.3)
Chloride: 82 mmol/L — ABNORMAL LOW (ref 98–111)
Creatinine, Ser: 0.67 mg/dL (ref 0.44–1.00)
GFR, Estimated: >60 mL/min (ref >60)
Glucose, Bld: 119 mg/dL — ABNORMAL HIGH (ref 70–99)
Potassium: 4.1 mmol/L (ref 3.5–5.1)
Sodium: 113 mmol/L — CL (ref 135–145)
Total Bilirubin: 0.9 mg/dL (ref 0.3–1.2)
Total Protein: 5.8 g/dL — ABNORMAL LOW (ref 6.5–8.1)

## 2021-06-19 LAB — BASIC METABOLIC PANEL
Anion gap: 11 (ref 5–15)
BUN: 47 mg/dL — ABNORMAL HIGH (ref 8–23)
CO2: 22 mmol/L (ref 22–32)
Calcium: 8.3 mg/dL — ABNORMAL LOW (ref 8.9–10.3)
Chloride: 81 mmol/L — ABNORMAL LOW (ref 98–111)
Creatinine, Ser: 0.73 mg/dL (ref 0.44–1.00)
GFR, Estimated: >60 mL/min (ref >60)
Glucose, Bld: 135 mg/dL — ABNORMAL HIGH (ref 70–99)
Potassium: 3.6 mmol/L (ref 3.5–5.1)
Sodium: 114 mmol/L — CL (ref 135–145)

## 2021-06-19 LAB — SODIUM, URINE, RANDOM: Sodium, Ur: 73 mmol/L

## 2021-06-19 LAB — GLUCOSE, CAPILLARY: Glucose-Capillary: 132 mg/dL — ABNORMAL HIGH (ref 70–99)

## 2021-06-19 LAB — TYPE AND SCREEN
ABO/RH(D): O NEG
Antibody Screen: NEGATIVE

## 2021-06-19 LAB — PHOSPHORUS: Phosphorus: 3.2 mg/dL (ref 2.5–4.6)

## 2021-06-19 LAB — MAGNESIUM
Magnesium: 1.4 mg/dL — ABNORMAL LOW (ref 1.7–2.4)
Magnesium: 2.4 mg/dL (ref 1.7–2.4)

## 2021-06-19 LAB — APTT: aPTT: 31 seconds (ref 24–36)

## 2021-06-19 LAB — BRAIN NATRIURETIC PEPTIDE: B Natriuretic Peptide: 163.9 pg/mL — ABNORMAL HIGH (ref 0.0–100.0)

## 2021-06-19 LAB — OSMOLALITY, URINE: Osmolality, Ur: 479 mOsm/kg (ref 300–900)

## 2021-06-19 LAB — OSMOLALITY: Osmolality: 236 mOsm/kg — CL (ref 275–295)

## 2021-06-19 LAB — MRSA NEXT GEN BY PCR, NASAL: MRSA by PCR Next Gen: NOT DETECTED

## 2021-06-19 LAB — SODIUM
Sodium: 113 mmol/L — CL (ref 135–145)
Sodium: 114 mmol/L — CL (ref 135–145)
Sodium: 116 mmol/L — CL (ref 135–145)

## 2021-06-19 LAB — CORTISOL: Cortisol, Plasma: 3.6 ug/dL

## 2021-06-19 MED ORDER — AMLODIPINE BESYLATE 10 MG PO TABS
10.0000 mg | ORAL_TABLET | Freq: Every day | ORAL | Status: DC
  Administered 2021-06-19 – 2021-06-22 (×4): 10 mg via ORAL
  Filled 2021-06-19 (×4): qty 1

## 2021-06-19 MED ORDER — ONDANSETRON HCL 4 MG/2ML IJ SOLN: 4.0000 mg | Freq: Four times a day (QID) | INTRAMUSCULAR | Status: DC | PRN

## 2021-06-19 MED ORDER — ENOXAPARIN SODIUM 40 MG/0.4ML IJ SOSY
40.0000 mg | PREFILLED_SYRINGE | INTRAMUSCULAR | Status: DC
  Administered 2021-06-19 – 2021-06-22 (×4): 40 mg via SUBCUTANEOUS
  Filled 2021-06-19 (×4): qty 0.4

## 2021-06-19 MED ORDER — POLYETHYLENE GLYCOL 3350 17 G PO PACK: 17.0000 g | PACK | Freq: Every day | ORAL | Status: DC | PRN

## 2021-06-19 MED ORDER — ONDANSETRON HCL 4 MG/2ML IJ SOLN
INTRAMUSCULAR | Status: AC
  Filled 2021-06-19: qty 2

## 2021-06-19 MED ORDER — DULOXETINE HCL 20 MG PO CPEP
20.0000 mg | ORAL_CAPSULE | Freq: Every day | ORAL | Status: DC
  Administered 2021-06-19 – 2021-06-22 (×4): 20 mg via ORAL
  Filled 2021-06-19 (×4): qty 1

## 2021-06-19 MED ORDER — PANTOPRAZOLE SODIUM 40 MG IV SOLR
40.0000 mg | Freq: Every day | INTRAVENOUS | Status: DC
  Administered 2021-06-19: 40 mg via INTRAVENOUS
  Filled 2021-06-19: qty 40

## 2021-06-19 MED ORDER — SODIUM CHLORIDE 1 G PO TABS
1.0000 g | ORAL_TABLET | Freq: Three times a day (TID) | ORAL | Status: DC
  Administered 2021-06-19 – 2021-06-22 (×10): 1 g via ORAL
  Filled 2021-06-19 (×15): qty 1

## 2021-06-19 MED ORDER — ACETAMINOPHEN 325 MG PO TABS
650.0000 mg | ORAL_TABLET | ORAL | Status: DC | PRN
  Administered 2021-06-19 – 2021-06-20 (×3): 650 mg via ORAL
  Filled 2021-06-19 (×3): qty 2

## 2021-06-19 MED ORDER — MAGNESIUM SULFATE 4 GM/100ML IV SOLN
4.0000 g | Freq: Once | INTRAVENOUS | Status: AC
  Administered 2021-06-19: 4 g via INTRAVENOUS
  Filled 2021-06-19: qty 100

## 2021-06-19 MED ORDER — MAGNESIUM SULFATE 2 GM/50ML IV SOLN
2.0000 g | Freq: Once | INTRAVENOUS | Status: AC
Start: 2021-06-19 — End: 2021-06-19
  Administered 2021-06-19: 2 g via INTRAVENOUS
  Filled 2021-06-19: qty 50

## 2021-06-19 MED ORDER — ALPRAZOLAM 0.25 MG PO TABS: 0.2500 mg | ORAL_TABLET | Freq: Three times a day (TID) | ORAL | Status: DC | PRN

## 2021-06-19 MED ORDER — ONDANSETRON HCL 4 MG/2ML IJ SOLN
4.0000 mg | Freq: Four times a day (QID) | INTRAMUSCULAR | Status: DC
  Administered 2021-06-19: 4 mg via INTRAVENOUS

## 2021-06-19 MED ORDER — DOCUSATE SODIUM 100 MG PO CAPS: 100.0000 mg | ORAL_CAPSULE | Freq: Two times a day (BID) | ORAL | Status: DC | PRN

## 2021-06-19 MED ORDER — CHLORHEXIDINE GLUCONATE CLOTH 2 % EX PADS
6.0000 | MEDICATED_PAD | Freq: Every day | CUTANEOUS | Status: DC
  Administered 2021-06-19 – 2021-06-22 (×2): 6 via TOPICAL

## 2021-06-19 MED ORDER — UREA 15 G PO PACK
30.0000 g | PACK | Freq: Two times a day (BID) | ORAL | Status: AC
  Administered 2021-06-19 (×2): 30 g via ORAL
  Filled 2021-06-19 (×3): qty 2

## 2021-06-19 NOTE — Progress Notes (Signed)
PCCM note  Repeat Na is same at 113 Add urea 30 mg bid for 2 doses. Repeat BMET at 6 pm  Marshell Garfinkel MD [facility] 06/19/2021, 1:25 PM

## 2021-06-19 NOTE — Progress Notes (Signed)
06/19/2021 Patient transfer from 65M to 2[facility] at 1700. She is alert, oriented to person, place time. Skin was assess and she was placed on stepdown monitor. Rico Sheehan RN

## 2021-06-19 NOTE — Progress Notes (Signed)
Date and time results received: 06/19/21 1747 (use smartphrase ".now" to insert current time)  Test: Sodium  Critical Value: 114  Name of Provider Notified: Noe Gens (NP)  Orders Received? Or Actions Taken?: No action taken continue to monitor sodium level.

## 2021-06-19 NOTE — Progress Notes (Addendum)
Called with follow up Na of 114 at 3pm.  Little change in Na, pending doses of urea.  Await 6pm labs. No change in patient status per RN - remains alert / oriented.      Noe Gens, MSN, APRN, NP-C, AGACNP-BC [facility] 06/19/2021, 6:15 PM   Please see [email or web address] for pager details.   From 7A-7P if no response, please call [phone] After hours, please call ELink [phone]

## 2021-06-19 NOTE — Progress Notes (Signed)
Mg 1.4 Electrolytes replaced per [facility] electrolyte replacement protocol

## 2021-06-19 NOTE — Progress Notes (Signed)
[facility] Progress Note Patient Name: Deborah Jordan DOB: Nov 29, 1933 MRN: [id number]   Date of Service  06/19/2021  HPI/Events of Note  Hyponatremia - Na+ = 111 --> 113.  eICU Interventions  Continue present management.      Intervention Category Major Interventions: Electrolyte abnormality - evaluation and management  [name] 06/19/2021, 5:21 AM

## 2021-06-19 NOTE — Progress Notes (Signed)
NAME:  Deborah Jordan, MRN:  [id number], DOB:  Sep 09, 1933, LOS: 0 ADMISSION DATE:  06/18/2021, CONSULTATION DATE:  06/19/21 REFERRING MD:  EDP , CHIEF COMPLAINT:  Hyponatremia  History of Present Illness:  This is a 87 yo female with history of HTN. HLD, CKD, bifasicular block who presented to primary physicain on 06/18/21 for generalized fatigue. Was told by PCP to go to ED. She presents to ED today and was noted to be Aox4. There was no history of seizure or changes in balance.  She has chronic diarrhea and does report more number of bowel movements than usual.  Overall she seems like herself. No bad headaches. No fevers. No chills. She has had chornic mild cough. She notes its more reflux than anything else. She did note some nausea and difficulty swallowing from timt to time. Because of this she has not been eating much. She has just been taking mostly water. She drinks about 8-10 cups a day. She has been taking her home meds daily which include norvasc, coreg, atorvastatin and a PPI.  No more swelling in extremitites than usual. No problems voiding. No fevers. No chills.   Hx of low NA in past (125-128), never this low.  IN ED work up pertinent for sodium of 111.   Pertinent  Medical History  Lumbar radiculopathy, lumbar stenosis L 3-4 and L 4-5 S/p transforaminal LS epidural steroid injection  Anxiety Arthritis GERD Grade 1 diastolic dysfunction (HFpEF) HTN RBBB  Significant Hospital Events: Including procedures, antibiotic start and stop dates in addition to other pertinent events   7/1 Admitted to ICU for monitoring  Interim History / Subjective:   Feels well.  No complaints Was able to take a diet today morning  Objective   Blood pressure (!) 145/45, pulse (!) 52, temperature (!) 97.5 F (36.4 C), temperature source Oral, resp. rate 20, height 4\' 11"  (1.499 m), weight 82.4 kg, SpO2 92 %.        Intake/Output Summary (Last 24 hours) at 06/19/2021 1042 Last data filed at 06/19/2021  0802 Gross per 24 hour  Intake 1159.94 ml  Output 200 ml  Net 959.94 ml   Filed Weights   06/18/21 2029 06/19/21 0117  Weight: 83 kg 82.4 kg    Examination: Gen:      No acute distress HEENT:  EOMI, sclera anicteric Neck:     No masses; no thyromegaly Lungs:    Clear to auscultation bilaterally; normal respiratory effort CV:         Regular rate and rhythm; no murmurs Abd:      + bowel sounds; soft, non-tender; no palpable masses, no distension Ext:    No edema; adequate peripheral perfusion Skin:      Warm and dry; no rash Neuro: alert and oriented x 3  Labs and imaging personally reviewed Significant for sodium slightly improved to 113, rest of the labs are stable  Resolved Hospital Problem list     Assessment & Plan:  This is a 87 yo with history as noted above who presents for chief complain of malaise found to be hyponatremic  Hyponatremia-Appears euvolemic on exam. Could be 2/2 poor solute intake vs SIADH. No acute symptoms. Does not need to to be aggressively corrected. Has some nausea/dysphagia that may need to be further investigated if continues to be a problem Continue volume restriction to 1 L Salt water tabs TSH, cortisol normal, BNP slightly elevated Follow urine osmolarity and urine sodium  No requirement for 3%  saline as she is neurologically intact.  Stable to transfer out of ICU and to hospitalist service  HTN-Continue home norvasc, Hold home coreg for now  Anxiety- Home xanax 0.25 TID PRN  Best Practice (right click and "Reselect all SmartList Selections" daily)   Diet/type: Regular consistency (see orders) DVT prophylaxis: LMWH GI prophylaxis: Home PPI is nexium. Protonix here Lines: N/A Foley:  N/A Code Status:  full code Last date of multidisciplinary goals of care discussion [Spoke with patient regarding code status on 06/19/21]  Critical care time: NA   Marshell Garfinkel MD [facility] See Amion for pager  If no  response to pager , please call [phone] until 7pm After 7:00 pm call Elink  [phone] 06/19/2021, 10:53 AM

## 2021-06-20 LAB — BASIC METABOLIC PANEL
Anion gap: 9 (ref 5–15)
BUN: 42 mg/dL — ABNORMAL HIGH (ref 8–23)
CO2: 23 mmol/L (ref 22–32)
Calcium: 8.5 mg/dL — ABNORMAL LOW (ref 8.9–10.3)
Chloride: 85 mmol/L — ABNORMAL LOW (ref 98–111)
Creatinine, Ser: 0.77 mg/dL (ref 0.44–1.00)
GFR, Estimated: >60 mL/min (ref >60)
Glucose, Bld: 119 mg/dL — ABNORMAL HIGH (ref 70–99)
Potassium: 4.5 mmol/L (ref 3.5–5.1)
Sodium: 117 mmol/L — CL (ref 135–145)

## 2021-06-20 LAB — SODIUM
Sodium: 114 mmol/L — CL (ref 135–145)
Sodium: 116 mmol/L — CL (ref 135–145)

## 2021-06-20 MED ORDER — PANTOPRAZOLE SODIUM 40 MG PO TBEC
40.0000 mg | DELAYED_RELEASE_TABLET | Freq: Every day | ORAL | Status: DC
  Administered 2021-06-20 – 2021-06-21 (×2): 40 mg via ORAL
  Filled 2021-06-20 (×2): qty 1

## 2021-06-20 NOTE — Progress Notes (Signed)
Date and time results received: 06/20/21 1748 (use smartphrase ".now" to insert current time)  Test: Sodium Critical Value: 117  Name of Provider Notified: Dr Holli Humbles  Orders Received? Or Actions Taken?: no orders given, but continue to monitor

## 2021-06-20 NOTE — Progress Notes (Signed)
PROGRESS NOTE    Deborah Jordan  QIH:[id number] DOB: 04-26-1933 DOA: 06/18/2021 PCP: [name], MD   Brief Narrative:  This is a 87 yo female with history of HTN. HLD, CKD, bifasicular block who presented to primary physicain on 06/18/21 for generalized fatigue. Was told by PCP to go to ED. She presents to ED today and was noted to be Aox4. There was no history of seizure or changes in balance.  She has chronic diarrhea and does report more number of bowel movements than usual.  Notes nausea and difficulty swallowing on occasion with poor p.o. intake recently drinking mostly water - up to 8-10 cups a day. She has been taking her home meds daily which include norvasc, coreg, atorvastatin and a PPI.  No more swelling in extremitites than usual. No problems voiding.  Baseline sodium appears to be around 1 25-1 28.  Admitted at 111 in the ED but given lack of symptoms was not a candidate for hypertonic saline.  Assessment & Plan:   Active Problems:   Hyponatremia   Dyspnea and respiratory abnormalities   Hyponatremia -likely in the setting of chronic diarrhea vs SIADH.  Continue volume restriction to 1L Salt water tabs going TSH, cortisol normal, BNP slightly elevated Osmolality low, likely in the setting of chronic diarrhea  HTN Continue home norvasc, Hold home coreg for now   Anxiety Home xanax 0.25 TID PRN  DVT prophylaxis: Lovenox Code Status: Full Family Communication: Son-in-law at bedside  Status is: Inpatient  Dispo: The patient is from: Home              Anticipated d/c is to: Home              Anticipated d/c date is: 24 to 48 hours              Patient currently not medically stable for discharge  Consultants:  PCCM  Procedures:  None  Antimicrobials:  None indicated  Subjective: No acute issues or events overnight, continues to complain of chronic dysphagia without aspiration or difficulty tolerating p.o. meds or breakfast this morning.  Otherwise denies  headache fevers chills nausea vomiting shortness of breath or chest pain  Objective: Vitals:   06/19/21 1900 06/19/21 2045 06/19/21 2325 06/20/21 0408  BP: (!) 142/94  (!) 159/59 (!) 131/51  Pulse: 64  60   Resp:   16   Temp: 97.8 F (36.6 C)  97.6 F (36.4 C) 97.7 F (36.5 C)  TempSrc: Oral  Oral Oral  SpO2: 94% 90% 96%   Weight:      Height:        Intake/Output Summary (Last 24 hours) at 06/20/2021 0734 Last data filed at 06/19/2021 1530 Gross per 24 hour  Intake 458.36 ml  Output 75 ml  Net 383.36 ml   Filed Weights   06/18/21 2029 06/19/21 0117  Weight: 83 kg 82.4 kg    Examination:  General:  Pleasantly resting in bed, No acute distress. HEENT:  Normocephalic atraumatic.  Sclerae nonicteric, noninjected.  Extraocular movements intact bilaterally. Neck:  Without mass or deformity.  Trachea is midline. Lungs:  Clear to auscultate bilaterally without rhonchi, wheeze, or rales. Heart:  Regular rate and rhythm.  Without murmurs, rubs, or gallops. Abdomen:  Soft, nontender, nondistended.  Without guarding or rebound. Extremities: Without cyanosis, clubbing, edema, or obvious deformity. Vascular:  Dorsalis pedis and posterior tibial pulses palpable bilaterally. Skin:  Warm and dry, no erythema, no ulcerations.  Data Reviewed: I  have personally reviewed following labs and imaging studies  CBC: Recent Labs  Lab 06/18/21 2039 06/19/21 0319  WBC 12.8* 10.7*  NEUTROABS 9.8* 8.1*  HGB 13.1 12.2  HCT 35.8* 33.1*  MCV 78.9* 79.2*  PLT 408* 878   Basic Metabolic Panel: Recent Labs  Lab 06/18/21 2039 06/19/21 0319 06/19/21 1114 06/19/21 1557 06/19/21 1840 06/19/21 2218 06/20/21 0256  NA 111* 113* 113* 114* 114* 116* 114*  K 3.9 4.1  --   --  3.6  --   --   CL 78* 82*  --   --  81*  --   --   CO2 22 22  --   --  22  --   --   GLUCOSE 115* 119*  --   --  135*  --   --   BUN 18 14  --   --  47*  --   --   CREATININE 0.78 0.67  --   --  0.73  --   --   CALCIUM  9.2 8.6*  --   --  8.3*  --   --   MG  --  1.4*  --   --  2.4  --   --   PHOS  --  3.2  --   --   --   --   --    GFR: Estimated Creatinine Clearance: 46.9 mL/min (by C-G formula based on SCr of 0.73 mg/dL). Liver Function Tests: Recent Labs  Lab 06/19/21 0319  AST 19  ALT 15  ALKPHOS 65  BILITOT 0.9  PROT 5.8*  ALBUMIN 3.5   No results for input(s): LIPASE, AMYLASE in the last 168 hours. No results for input(s): AMMONIA in the last 168 hours. Coagulation Profile: Recent Labs  Lab 06/19/21 0319  INR 1.1   Cardiac Enzymes: No results for input(s): CKTOTAL, CKMB, CKMBINDEX, TROPONINI in the last 168 hours. BNP (last 3 results) No results for input(s): PROBNP in the last 8760 hours. HbA1C: No results for input(s): HGBA1C in the last 72 hours. CBG: Recent Labs  Lab 06/19/21 0118  GLUCAP 132*   Lipid Profile: No results for input(s): CHOL, HDL, LDLCALC, TRIG, CHOLHDL, LDLDIRECT in the last 72 hours. Thyroid Function Tests: Recent Labs    06/19/21 0319  TSH 1.872   Anemia Panel: No results for input(s): VITAMINB12, FOLATE, FERRITIN, TIBC, IRON, RETICCTPCT in the last 72 hours. Sepsis Labs: No results for input(s): PROCALCITON, LATICACIDVEN in the last 168 hours.  Recent Results (from the past 240 hour(s))  Resp Panel by RT-PCR (Flu A&B, Covid) Nasopharyngeal Swab     Status: None   Collection Time: 06/18/21  9:25 PM   Specimen: Nasopharyngeal Swab; Nasopharyngeal(NP) swabs in vial transport medium  Result Value Ref Range Status   SARS Coronavirus 2 by RT PCR NEGATIVE NEGATIVE Final    Comment: (NOTE) SARS-CoV-2 target nucleic acids are NOT DETECTED.  The SARS-CoV-2 RNA is generally detectable in upper respiratory specimens during the acute phase of infection. The lowest concentration of SARS-CoV-2 viral copies this assay can detect is 138 copies/mL. A negative result does not preclude SARS-Cov-2 infection and should not be used as the sole basis for treatment  or other patient management decisions. A negative result may occur with  improper specimen collection/handling, submission of specimen other than nasopharyngeal swab, presence of viral mutation(s) within the areas targeted by this assay, and inadequate number of viral copies(<138 copies/mL). A negative result must be combined with clinical observations,  patient history, and epidemiological information. The expected result is Negative.  Fact Sheet for Patients:  EntrepreneurPulse.com.au  Fact Sheet for Healthcare Providers:  IncredibleEmployment.be  This test is no t yet approved or cleared by the [place] FDA and  has been authorized for detection and/or diagnosis of SARS-CoV-2 by FDA under an Emergency Use Authorization (EUA). This EUA will remain  in effect (meaning this test can be used) for the duration of the COVID-19 declaration under Section 564(b)(1) of the Act, 21 U.S.C.section 360bbb-3(b)(1), unless the authorization is terminated  or revoked sooner.       Influenza A by PCR NEGATIVE NEGATIVE Final   Influenza B by PCR NEGATIVE NEGATIVE Final    Comment: (NOTE) The Xpert Xpress SARS-CoV-2/FLU/RSV plus assay is intended as an aid in the diagnosis of influenza from Nasopharyngeal swab specimens and should not be used as a sole basis for treatment. Nasal washings and aspirates are unacceptable for Xpert Xpress SARS-CoV-2/FLU/RSV testing.  Fact Sheet for Patients: EntrepreneurPulse.com.au  Fact Sheet for Healthcare Providers: IncredibleEmployment.be  This test is not yet approved or cleared by the [place] FDA and has been authorized for detection and/or diagnosis of SARS-CoV-2 by FDA under an Emergency Use Authorization (EUA). This EUA will remain in effect (meaning this test can be used) for the duration of the COVID-19 declaration under Section 564(b)(1) of the Act, 21 U.S.C. section  360bbb-3(b)(1), unless the authorization is terminated or revoked.  Performed at [facility], [address], [place], [place] [address]   MRSA Next Gen by PCR, Nasal     Status: None   Collection Time: 06/19/21  1:11 AM  Result Value Ref Range Status   MRSA by PCR Next Gen NOT DETECTED NOT DETECTED Final    Comment: (NOTE) The GeneXpert MRSA Assay (FDA approved for NASAL specimens only), is one component of a comprehensive MRSA colonization surveillance program. It is not intended to diagnose MRSA infection nor to guide or monitor treatment for MRSA infections. Test performance is not FDA approved in patients less than 53 years old. Performed at [facility], [place] [address] [place], [place] [address]     Radiology Studies: DG CHEST PORT 1 VIEW  Result Date: 06/19/2021 CLINICAL DATA:  Acute shortness of breath EXAM: PORTABLE CHEST 1 VIEW COMPARISON:  11/19/2019 and prior radiographs FINDINGS: Cardiomegaly and pulmonary vascular congestion identified. Elevation of the RIGHT hemidiaphragm again noted. There is no evidence of focal airspace disease, pulmonary edema, suspicious pulmonary nodule/mass, pleural effusion, or pneumothorax. No acute bony abnormalities are identified. IMPRESSION: Cardiomegaly with pulmonary vascular congestion. Electronically Signed   By: Margarette Canada M.D.   On: 06/19/2021 12:57    Scheduled Meds:  amLODipine  10 mg Oral Daily   carvedilol  12.5 mg Oral BID WC   Chlorhexidine Gluconate Cloth  6 each Topical Daily   DULoxetine  20 mg Oral Daily   enoxaparin (LOVENOX) injection  40 mg Subcutaneous Q24H   pantoprazole (PROTONIX) IV  40 mg Intravenous QHS   sodium chloride  1 g Oral TID WC    LOS: 1 day   Time spent: 16min  Angeles Zehner C Kyah Buesing, DO [facility]  If 7PM-7AM, please contact night-coverage [email or web address]  06/20/2021, 7:34 AM

## 2021-06-20 NOTE — Progress Notes (Signed)
Date and time results received: 06/20/21 0735 (use smartphrase ".now" to insert current time)  Test: Sodium Critical Value:116  Name of Provider Notified: Dr Holli Humbles  Orders Received? Or Actions Taken?: no order given continue to monitor

## 2021-06-21 LAB — BASIC METABOLIC PANEL
Anion gap: 11 (ref 5–15)
Anion gap: 8 (ref 5–15)
BUN: 27 mg/dL — ABNORMAL HIGH (ref 8–23)
BUN: 23 mg/dL (ref 8–23)
CO2: 24 mmol/L (ref 22–32)
CO2: 25 mmol/L (ref 22–32)
Calcium: 8.6 mg/dL — ABNORMAL LOW (ref 8.9–10.3)
Calcium: 8.8 mg/dL — ABNORMAL LOW (ref 8.9–10.3)
Chloride: 84 mmol/L — ABNORMAL LOW (ref 98–111)
Chloride: 89 mmol/L — ABNORMAL LOW (ref 98–111)
Creatinine, Ser: 0.74 mg/dL (ref 0.44–1.00)
Creatinine, Ser: 0.8 mg/dL (ref 0.44–1.00)
GFR, Estimated: >60 mL/min (ref >60)
GFR, Estimated: >60 mL/min (ref >60)
Glucose, Bld: 100 mg/dL — ABNORMAL HIGH (ref 70–99)
Glucose, Bld: 111 mg/dL — ABNORMAL HIGH (ref 70–99)
Potassium: 3.5 mmol/L (ref 3.5–5.1)
Potassium: 3.9 mmol/L (ref 3.5–5.1)
Sodium: 119 mmol/L — CL (ref 135–145)
Sodium: 122 mmol/L — ABNORMAL LOW (ref 135–145)

## 2021-06-21 LAB — CBC
HCT: 31.7 % — ABNORMAL LOW (ref 36.0–46.0)
Hemoglobin: 11.2 g/dL — ABNORMAL LOW (ref 12.0–15.0)
MCH: 28.8 pg (ref 26.0–34.0)
MCHC: 35.3 g/dL (ref 30.0–36.0)
MCV: 81.5 fL (ref 80.0–100.0)
Platelets: 356 10*3/uL (ref 150–400)
RBC: 3.89 MIL/uL (ref 3.87–5.11)
RDW: 11.9 % (ref 11.5–15.5)
WBC: 9.3 10*3/uL (ref 4.0–10.5)
nRBC: 0 % (ref 0.0–0.2)

## 2021-06-21 NOTE — Progress Notes (Signed)
PROGRESS NOTE    Deborah Jordan  KKX:[id number] DOB: 1933/01/06 DOA: 06/18/2021 PCP: [name], MD   Brief Narrative:  This is a 87 yo female with history of HTN. HLD, CKD, bifasicular block who presented to primary physicain on 06/18/21 for generalized fatigue. Was told by PCP to go to ED. She presents to ED today and was noted to be Aox4. There was no history of seizure or changes in balance.  She has chronic diarrhea and does report more number of bowel movements than usual.  Notes nausea and difficulty swallowing on occasion with poor p.o. intake recently drinking mostly water - up to 8-10 cups a day. She has been taking her home meds daily which include norvasc, coreg, atorvastatin and a PPI.  No more swelling in extremitites than usual. No problems voiding.  Baseline sodium appears to be around 1 25-1 28.  Admitted at 111 in the ED but given lack of symptoms was not a candidate for hypertonic saline.  Assessment & Plan:   Active Problems:   Hyponatremia   Dyspnea and respiratory abnormalities   Hyponatremia -likely in the setting of chronic diarrhea vs SIADH, improving Continue volume restriction to 1L Salt water tabs ongoing TSH, cortisol normal, BNP slightly elevated Osmolality low, likely in the setting of chronic diarrhea  HTN Continue home norvasc, Hold home coreg for now   Anxiety Home xanax 0.25 TID PRN  DVT prophylaxis: Lovenox Code Status: Full Family Communication: Son-in-law at bedside  Status is: Inpatient  Dispo: The patient is from: Home              Anticipated d/c is to: Home              Anticipated d/c date is: 24 to 48 hours              Patient currently not medically stable for discharge  Consultants:  PCCM  Procedures:  None  Antimicrobials:  None indicated  Subjective: No acute issues or events overnight, indicates her swallowing appears to be resolving, no cough fever chills nausea vomiting diarrhea constipation headache chest pain  shortness of breath.  Objective: Vitals:   06/20/21 1600 06/20/21 1937 06/20/21 2350 06/21/21 0410  BP: (!) 131/39 (!) 124/31 (!) 136/54 (!) 127/34  Pulse: (!) 58 61 (!) 57 (!) 57  Resp: 18 18  14   Temp: 97.7 F (36.5 C) 98.5 F (36.9 C) 97.7 F (36.5 C) 97.8 F (36.6 C)  TempSrc: Oral Oral Oral Oral  SpO2: 95% 95%  92%  Weight:    89.1 kg  Height:        Intake/Output Summary (Last 24 hours) at 06/21/2021 0719 Last data filed at 06/20/2021 2110 Gross per 24 hour  Intake --  Output 400 ml  Net -400 ml    Filed Weights   06/18/21 2029 06/19/21 0117 06/21/21 0410  Weight: 83 kg 82.4 kg 89.1 kg    Examination:  General:  Pleasantly resting in bed, No acute distress. HEENT:  Normocephalic atraumatic.  Sclerae nonicteric, noninjected.  Extraocular movements intact bilaterally. Neck:  Without mass or deformity.  Trachea is midline. Lungs:  Clear to auscultate bilaterally without rhonchi, wheeze, or rales. Heart:  Regular rate and rhythm.  Without murmurs, rubs, or gallops. Abdomen:  Soft, nontender, nondistended.  Without guarding or rebound. Extremities: Without cyanosis, clubbing, edema, or obvious deformity. Vascular:  Dorsalis pedis and posterior tibial pulses palpable bilaterally. Skin:  Warm and dry, no erythema, no ulcerations.  Data  Reviewed: I have personally reviewed following labs and imaging studies  CBC: Recent Labs  Lab 06/18/21 2039 06/19/21 0319 06/21/21 0500  WBC 12.8* 10.7* 9.3  NEUTROABS 9.8* 8.1*  --   HGB 13.1 12.2 11.2*  HCT 35.8* 33.1* 31.7*  MCV 78.9* 79.2* 81.5  PLT 408* 394 811    Basic Metabolic Panel: Recent Labs  Lab 06/18/21 2039 06/19/21 0319 06/19/21 1114 06/19/21 1840 06/19/21 2218 06/20/21 0256 06/20/21 0735 06/20/21 1648 06/21/21 0500  NA 111* 113*   < > 114* 116* 114* 116* 117* 119*  K 3.9 4.1  --  3.6  --   --   --  4.5 3.5  CL 78* 82*  --  81*  --   --   --  85* 84*  CO2 22 22  --  22  --   --   --  23 24  GLUCOSE  115* 119*  --  135*  --   --   --  119* 100*  BUN 18 14  --  47*  --   --   --  42* 27*  CREATININE 0.78 0.67  --  0.73  --   --   --  0.77 0.74  CALCIUM 9.2 8.6*  --  8.3*  --   --   --  8.5* 8.6*  MG  --  1.4*  --  2.4  --   --   --   --   --   PHOS  --  3.2  --   --   --   --   --   --   --    < > = values in this interval not displayed.    GFR: Estimated Creatinine Clearance: 49.1 mL/min (by C-G formula based on SCr of 0.74 mg/dL). Liver Function Tests: Recent Labs  Lab 06/19/21 0319  AST 19  ALT 15  ALKPHOS 65  BILITOT 0.9  PROT 5.8*  ALBUMIN 3.5    No results for input(s): LIPASE, AMYLASE in the last 168 hours. No results for input(s): AMMONIA in the last 168 hours. Coagulation Profile: Recent Labs  Lab 06/19/21 0319  INR 1.1    Cardiac Enzymes: No results for input(s): CKTOTAL, CKMB, CKMBINDEX, TROPONINI in the last 168 hours. BNP (last 3 results) No results for input(s): PROBNP in the last 8760 hours. HbA1C: No results for input(s): HGBA1C in the last 72 hours. CBG: Recent Labs  Lab 06/19/21 0118  GLUCAP 132*    Lipid Profile: No results for input(s): CHOL, HDL, LDLCALC, TRIG, CHOLHDL, LDLDIRECT in the last 72 hours. Thyroid Function Tests: Recent Labs    06/19/21 0319  TSH 1.872    Anemia Panel: No results for input(s): VITAMINB12, FOLATE, FERRITIN, TIBC, IRON, RETICCTPCT in the last 72 hours. Sepsis Labs: No results for input(s): PROCALCITON, LATICACIDVEN in the last 168 hours.  Recent Results (from the past 240 hour(s))  Resp Panel by RT-PCR (Flu A&B, Covid) Nasopharyngeal Swab     Status: None   Collection Time: 06/18/21  9:25 PM   Specimen: Nasopharyngeal Swab; Nasopharyngeal(NP) swabs in vial transport medium  Result Value Ref Range Status   SARS Coronavirus 2 by RT PCR NEGATIVE NEGATIVE Final    Comment: (NOTE) SARS-CoV-2 target nucleic acids are NOT DETECTED.  The SARS-CoV-2 RNA is generally detectable in upper respiratory specimens  during the acute phase of infection. The lowest concentration of SARS-CoV-2 viral copies this assay can detect is 138 copies/mL. A negative result does  not preclude SARS-Cov-2 infection and should not be used as the sole basis for treatment or other patient management decisions. A negative result may occur with  improper specimen collection/handling, submission of specimen other than nasopharyngeal swab, presence of viral mutation(s) within the areas targeted by this assay, and inadequate number of viral copies(<138 copies/mL). A negative result must be combined with clinical observations, patient history, and epidemiological information. The expected result is Negative.  Fact Sheet for Patients:  EntrepreneurPulse.com.au  Fact Sheet for Healthcare Providers:  IncredibleEmployment.be  This test is no t yet approved or cleared by the [place] FDA and  has been authorized for detection and/or diagnosis of SARS-CoV-2 by FDA under an Emergency Use Authorization (EUA). This EUA will remain  in effect (meaning this test can be used) for the duration of the COVID-19 declaration under Section 564(b)(1) of the Act, 21 U.S.C.section 360bbb-3(b)(1), unless the authorization is terminated  or revoked sooner.       Influenza A by PCR NEGATIVE NEGATIVE Final   Influenza B by PCR NEGATIVE NEGATIVE Final    Comment: (NOTE) The Xpert Xpress SARS-CoV-2/FLU/RSV plus assay is intended as an aid in the diagnosis of influenza from Nasopharyngeal swab specimens and should not be used as a sole basis for treatment. Nasal washings and aspirates are unacceptable for Xpert Xpress SARS-CoV-2/FLU/RSV testing.  Fact Sheet for Patients: EntrepreneurPulse.com.au  Fact Sheet for Healthcare Providers: IncredibleEmployment.be  This test is not yet approved or cleared by the [place] FDA and has been authorized for detection  and/or diagnosis of SARS-CoV-2 by FDA under an Emergency Use Authorization (EUA). This EUA will remain in effect (meaning this test can be used) for the duration of the COVID-19 declaration under Section 564(b)(1) of the Act, 21 U.S.C. section 360bbb-3(b)(1), unless the authorization is terminated or revoked.  Performed at [facility], [address], [place], [place] [address]   MRSA Next Gen by PCR, Nasal     Status: None   Collection Time: 06/19/21  1:11 AM  Result Value Ref Range Status   MRSA by PCR Next Gen NOT DETECTED NOT DETECTED Final    Comment: (NOTE) The GeneXpert MRSA Assay (FDA approved for NASAL specimens only), is one component of a comprehensive MRSA colonization surveillance program. It is not intended to diagnose MRSA infection nor to guide or monitor treatment for MRSA infections. Test performance is not FDA approved in patients less than 35 years old. Performed at [facility], [place] [address] [place], [place] [address]      Radiology Studies: DG CHEST PORT 1 VIEW  Result Date: 06/19/2021 CLINICAL DATA:  Acute shortness of breath EXAM: PORTABLE CHEST 1 VIEW COMPARISON:  11/19/2019 and prior radiographs FINDINGS: Cardiomegaly and pulmonary vascular congestion identified. Elevation of the RIGHT hemidiaphragm again noted. There is no evidence of focal airspace disease, pulmonary edema, suspicious pulmonary nodule/mass, pleural effusion, or pneumothorax. No acute bony abnormalities are identified. IMPRESSION: Cardiomegaly with pulmonary vascular congestion. Electronically Signed   By: Margarette Canada M.D.   On: 06/19/2021 12:57    Scheduled Meds:  amLODipine  10 mg Oral Daily   carvedilol  12.5 mg Oral BID WC   Chlorhexidine Gluconate Cloth  6 each Topical Daily   DULoxetine  20 mg Oral Daily   enoxaparin (LOVENOX) injection  40 mg Subcutaneous Q24H   pantoprazole  40 mg Oral QHS   sodium chloride  1 g Oral TID WC    LOS: 2 days   Time  spent: 35min  Mozell Hardacre C Neriah Brott, DO [facility]  If 7PM-7AM, please contact night-coverage [email or web address]  06/21/2021, 7:19 AM

## 2021-06-22 LAB — CBC
HCT: 32.3 % — ABNORMAL LOW (ref 36.0–46.0)
Hemoglobin: 11.6 g/dL — ABNORMAL LOW (ref 12.0–15.0)
MCH: 29.1 pg (ref 26.0–34.0)
MCHC: 35.9 g/dL (ref 30.0–36.0)
MCV: 81 fL (ref 80.0–100.0)
Platelets: 394 10*3/uL (ref 150–400)
RBC: 3.99 MIL/uL (ref 3.87–5.11)
RDW: 12 % (ref 11.5–15.5)
WBC: 10 10*3/uL (ref 4.0–10.5)
nRBC: 0 % (ref 0.0–0.2)

## 2021-06-22 LAB — BASIC METABOLIC PANEL
Anion gap: 9 (ref 5–15)
BUN: 14 mg/dL (ref 8–23)
CO2: 26 mmol/L (ref 22–32)
Calcium: 8.7 mg/dL — ABNORMAL LOW (ref 8.9–10.3)
Chloride: 85 mmol/L — ABNORMAL LOW (ref 98–111)
Creatinine, Ser: 0.64 mg/dL (ref 0.44–1.00)
GFR, Estimated: >60 mL/min (ref >60)
Glucose, Bld: 92 mg/dL (ref 70–99)
Potassium: 3.4 mmol/L — ABNORMAL LOW (ref 3.5–5.1)
Sodium: 120 mmol/L — ABNORMAL LOW (ref 135–145)

## 2021-06-22 MED ORDER — ALPRAZOLAM 0.25 MG PO TABS: 0.2500 mg | ORAL_TABLET | Freq: Three times a day (TID) | ORAL | 0 refills | Status: DC | PRN

## 2021-06-22 MED ORDER — SODIUM CHLORIDE 1 G PO TABS: 1.0000 g | ORAL_TABLET | Freq: Three times a day (TID) | ORAL | 1 refills | Status: DC

## 2021-06-22 NOTE — Progress Notes (Signed)
Discharge instructions (including medications) discussed with and copy provided to patient/caregiver 

## 2021-06-22 NOTE — Care Management Important Message (Signed)
Important Message  Patient Details  Name: Deborah Jordan MRN: [id number] Date of Birth: 1933/01/20   Medicare Important Message Given:  Yes     Falen Lehrmann P Jewell 06/22/2021, 3:13 PM

## 2021-06-22 NOTE — Discharge Summary (Signed)
Physician Discharge Summary  Deborah Jordan OTL:[id number] DOB: [birth year] DOA: 06/18/2021  PCP: [name], MD  Admit date: 06/18/2021 Discharge date: 06/22/2021  Admitted From: Home Disposition:  Home  Recommendations for Outpatient Follow-up:  Follow up with PCP in 1-2 weeks Please obtain BMP/CBC in one week Please follow up with orthopedics as scheduled  Home Health:None  Equipment/Devices:None  Discharge Condition:Stable  CODE STATUS:Full  Diet recommendation: Low fat diet    Brief/Interim Summary: Deborah Jordan is a 87 yo female with history of HTN. HLD, CKD, bifasicular block who presented to primary physicain on 06/18/21 for generalized fatigue. Was told by PCP to go to ED. She presents to ED today and was noted to be Aox4. There was no history of seizure or changes in balance.  She has chronic diarrhea and does report more number of bowel movements than usual.  Notes nausea and difficulty swallowing on occasion with poor p.o. intake recently drinking mostly water - up to 8-10 cups a day. She has been taking her home meds daily which include norvasc, coreg, atorvastatin and a PPI.  No more swelling in extremitites than usual. No problems voiding.  Baseline sodium appears to be around 1 25-1 28.  Admitted at 111 in the ED but given lack of symptoms was not a candidate for hypertonic saline.  Patient admitted as above with acute fatigue and weakness found to have hyponatremia. Sodium increased with fluid restriction and salt tabs. Patient planned to follow up with orthopedic surgery for back pain that is chronic per son at bedside. Follow up with PCP in the next week for repeat BMP to follow sodium levels. Recommend to continue salt tabs and fluid restriction at 2L per day.   Discharge Diagnoses:  Active Problems:   Hyponatremia   Dyspnea and respiratory abnormalities  Discharge Instructions  Discharge Instructions     Call MD for:  extreme fatigue   Complete by: As directed    Call  MD for:  persistant dizziness or light-headedness   Complete by: As directed    Call MD for:  persistant nausea and vomiting   Complete by: As directed    Diet general   Complete by: As directed    Limit fluid intake to 2L per day   Increase activity slowly   Complete by: As directed       Allergies as of 06/22/2021       Reactions   Ace Inhibitors Cough        Medication List     STOP taking these medications    diazepam 2 MG tablet Commonly known as: VALIUM       TAKE these medications    acetaminophen 650 MG CR tablet Commonly known as: TYLENOL Take 650 mg by mouth every 8 (eight) hours as needed for pain.   ALPRAZolam 0.25 MG tablet Commonly known as: XANAX Take 1 tablet (0.25 mg total) by mouth 3 (three) times daily as needed for anxiety. What changed: how much to take   amLODipine 10 MG tablet Commonly known as: NORVASC Take 10 mg by mouth daily.   atorvastatin 10 MG tablet Commonly known as: LIPITOR Take 10 mg by mouth daily.   carvedilol 12.5 MG tablet Commonly known as: COREG Take 12.5 mg by mouth 2 (two) times daily with a meal.   DULoxetine 20 MG capsule Commonly known as: Cymbalta Take 1 capsule (20 mg total) by mouth daily.   esomeprazole 40 MG capsule Commonly known as: NEXIUM Take 40  mg by mouth daily.   gabapentin 100 MG capsule Commonly known as: NEURONTIN Take 1 capsule (100 mg total) by mouth at bedtime.   loratadine 10 MG tablet Commonly known as: CLARITIN Take 10 mg by mouth daily.   sodium chloride 1 g tablet Take 1 tablet (1 g total) by mouth 3 (three) times daily with meals.   Vitamin D 50 MCG (2000 UT) Caps Take 2,000 Units by mouth daily.        Allergies  Allergen Reactions   Ace Inhibitors Cough    Consultations: PCCM   Procedures/Studies: Epidural Steroid injection  Result Date: 06/16/2021 Magnus Sinning, MD     06/17/2021  6:06 AM Lumbosacral Transforaminal Epidural Steroid Injection -  Sub-Pedicular Approach with Fluoroscopic Guidance Patient: Deborah Jordan     Date of Birth: January 16, 1933 MRN: [id number] PCP: [name], MD     Visit Date: 06/16/2021  Universal Protocol:   Date/Time: 06/16/2021 Consent Given By: the patient Position: PRONE Additional Comments: Vital signs were monitored before and after the procedure. Patient was prepped and draped in the usual sterile fashion. The correct patient, procedure, and site was verified. Injection Procedure Details: Procedure diagnoses: Lumbar radiculopathy [M54.16]  Meds Administered: Meds ordered this encounter Medications  methylPREDNISolone acetate (DEPO-MEDROL) injection 80 mg Laterality: Bilateral Location/Site: L3-L4 Needle:5.0 in., 22 ga.  Short bevel or Quincke spinal needle Needle Placement: Transforaminal Findings:   -Comments: Excellent flow of contrast along the nerve, nerve root and into the epidural space. Procedure Details: After squaring off the end-plates to get a true AP view, the C-arm was positioned so that an oblique view of the foramen as noted above was visualized. The target area is just inferior to the "nose of the scotty dog" or sub pedicular. The soft tissues overlying this structure were infiltrated with 2-3 ml. of 1% Lidocaine without Epinephrine. The spinal needle was inserted toward the target using a "trajectory" view along the fluoroscope beam.  Under AP and lateral visualization, the needle was advanced so it did not puncture dura and was located close the 6 O'Clock position of the pedical in AP tracterory. Biplanar projections were used to confirm position. Aspiration was confirmed to be negative for CSF and/or blood. A 1-2 ml. volume of Isovue-250 was injected and flow of contrast was noted at each level. Radiographs were obtained for documentation purposes. After attaining the desired flow of contrast documented above, a 0.5 to 1.0 ml test dose of 0.25% Marcaine was injected into each respective transforaminal  space.  The patient was observed for 90 seconds post injection.  After no sensory deficits were reported, and normal lower extremity motor function was noted,   the above injectate was administered so that equal amounts of the injectate were placed at each foramen (level) into the transforaminal epidural space. Additional Comments: The patient tolerated the procedure well Dressing: 2 x 2 sterile gauze and Band-Aid  Post-procedure details: Patient was observed during the procedure. Post-procedure instructions were reviewed. Patient left the clinic in stable condition.   DG CHEST PORT 1 VIEW  Result Date: 06/19/2021 CLINICAL DATA:  Acute shortness of breath EXAM: PORTABLE CHEST 1 VIEW COMPARISON:  11/19/2019 and prior radiographs FINDINGS: Cardiomegaly and pulmonary vascular congestion identified. Elevation of the RIGHT hemidiaphragm again noted. There is no evidence of focal airspace disease, pulmonary edema, suspicious pulmonary nodule/mass, pleural effusion, or pneumothorax. No acute bony abnormalities are identified. IMPRESSION: Cardiomegaly with pulmonary vascular congestion. Electronically Signed   By: Cleatis Polka.D.  On: 06/19/2021 12:57   XR C-ARM NO REPORT  Result Date: 06/16/2021 Please see Notes tab for imaging impression.    Subjective: No acute issues/events overnight   Discharge Exam: Vitals:   06/22/21 0544 06/22/21 0820  BP: (!) 130/53 (!) 133/55  Pulse: (!) 56 64  Resp: 17 18  Temp: 97.8 F (36.6 C) 98.1 F (36.7 C)  SpO2: 93% 96%   Vitals:   06/21/21 1600 06/21/21 1948 06/22/21 0544 06/22/21 0820  BP: (!) 135/39 (!) 148/53 (!) 130/53 (!) 133/55  Pulse:  63 (!) 56 64  Resp: 12 15 17 18   Temp: 97.8 F (36.6 C) 97.7 F (36.5 C) 97.8 F (36.6 C) 98.1 F (36.7 C)  TempSrc: Oral Oral  Oral  SpO2:  94% 93% 96%  Weight:      Height:        General: Pt is alert, awake, not in acute distress Cardiovascular: RRR, S1/S2 +, no rubs, no gallops Respiratory: CTA  bilaterally, no wheezing, no rhonchi Abdominal: Soft, NT, ND, bowel sounds + Extremities: no edema, no cyanosis    The results of significant diagnostics from this hospitalization (including imaging, microbiology, ancillary and laboratory) are listed below for reference.     Microbiology: Recent Results (from the past 240 hour(s))  Resp Panel by RT-PCR (Flu A&B, Covid) Nasopharyngeal Swab     Status: None   Collection Time: 06/18/21  9:25 PM   Specimen: Nasopharyngeal Swab; Nasopharyngeal(NP) swabs in vial transport medium  Result Value Ref Range Status   SARS Coronavirus 2 by RT PCR NEGATIVE NEGATIVE Final    Comment: (NOTE) SARS-CoV-2 target nucleic acids are NOT DETECTED.  The SARS-CoV-2 RNA is generally detectable in upper respiratory specimens during the acute phase of infection. The lowest concentration of SARS-CoV-2 viral copies this assay can detect is 138 copies/mL. A negative result does not preclude SARS-Cov-2 infection and should not be used as the sole basis for treatment or other patient management decisions. A negative result may occur with  improper specimen collection/handling, submission of specimen other than nasopharyngeal swab, presence of viral mutation(s) within the areas targeted by this assay, and inadequate number of viral copies(<138 copies/mL). A negative result must be combined with clinical observations, patient history, and epidemiological information. The expected result is Negative.  Fact Sheet for Patients:  EntrepreneurPulse.com.au  Fact Sheet for Healthcare Providers:  IncredibleEmployment.be  This test is no t yet approved or cleared by the [place] FDA and  has been authorized for detection and/or diagnosis of SARS-CoV-2 by FDA under an Emergency Use Authorization (EUA). This EUA will remain  in effect (meaning this test can be used) for the duration of the COVID-19 declaration under Section  564(b)(1) of the Act, 21 U.S.C.section 360bbb-3(b)(1), unless the authorization is terminated  or revoked sooner.       Influenza A by PCR NEGATIVE NEGATIVE Final   Influenza B by PCR NEGATIVE NEGATIVE Final    Comment: (NOTE) The Xpert Xpress SARS-CoV-2/FLU/RSV plus assay is intended as an aid in the diagnosis of influenza from Nasopharyngeal swab specimens and should not be used as a sole basis for treatment. Nasal washings and aspirates are unacceptable for Xpert Xpress SARS-CoV-2/FLU/RSV testing.  Fact Sheet for Patients: EntrepreneurPulse.com.au  Fact Sheet for Healthcare Providers: IncredibleEmployment.be  This test is not yet approved or cleared by the [place] FDA and has been authorized for detection and/or diagnosis of SARS-CoV-2 by FDA under an Emergency Use Authorization (EUA). This EUA will remain in  effect (meaning this test can be used) for the duration of the COVID-19 declaration under Section 564(b)(1) of the Act, 21 U.S.C. section 360bbb-3(b)(1), unless the authorization is terminated or revoked.  Performed at [facility], [address] [place], [place] [address]   MRSA Next Gen by PCR, Nasal     Status: None   Collection Time: 06/19/21  1:11 AM  Result Value Ref Range Status   MRSA by PCR Next Gen NOT DETECTED NOT DETECTED Final    Comment: (NOTE) The GeneXpert MRSA Assay (FDA approved for NASAL specimens only), is one component of a comprehensive MRSA colonization surveillance program. It is not intended to diagnose MRSA infection nor to guide or monitor treatment for MRSA infections. Test performance is not FDA approved in patients less than 43 years old. Performed at [facility], [place] [address], [place], [place] [address]      Labs: BNP (last 3 results) Recent Labs    06/19/21 0319  BNP 299.2*   Basic Metabolic Panel: Recent Labs  Lab 06/19/21 0319 06/19/21 1114  06/19/21 1840 06/19/21 2218 06/20/21 0735 06/20/21 1648 06/21/21 0500 06/21/21 1728 06/22/21 0602  NA 113*   < > 114*   < > 116* 117* 119* 122* 120*  K 4.1  --  3.6  --   --  4.5 3.5 3.9 3.4*  CL 82*  --  81*  --   --  85* 84* 89* 85*  CO2 22  --  22  --   --  23 24 25 26   GLUCOSE 119*  --  135*  --   --  119* 100* 111* 92  BUN 14  --  47*  --   --  42* 27* 23 14  CREATININE 0.67  --  0.73  --   --  0.77 0.74 0.80 0.64  CALCIUM 8.6*  --  8.3*  --   --  8.5* 8.6* 8.8* 8.7*  MG 1.4*  --  2.4  --   --   --   --   --   --   PHOS 3.2  --   --   --   --   --   --   --   --    < > = values in this interval not displayed.   Liver Function Tests: Recent Labs  Lab 06/19/21 0319  AST 19  ALT 15  ALKPHOS 65  BILITOT 0.9  PROT 5.8*  ALBUMIN 3.5   No results for input(s): LIPASE, AMYLASE in the last 168 hours. No results for input(s): AMMONIA in the last 168 hours. CBC: Recent Labs  Lab 06/18/21 2039 06/19/21 0319 06/21/21 0500 06/22/21 0602  WBC 12.8* 10.7* 9.3 10.0  NEUTROABS 9.8* 8.1*  --   --   HGB 13.1 12.2 11.2* 11.6*  HCT 35.8* 33.1* 31.7* 32.3*  MCV 78.9* 79.2* 81.5 81.0  PLT 408* 394 356 394   Cardiac Enzymes: No results for input(s): CKTOTAL, CKMB, CKMBINDEX, TROPONINI in the last 168 hours. BNP: Invalid input(s): POCBNP CBG: Recent Labs  Lab 06/19/21 0118  GLUCAP 132*   D-Dimer No results for input(s): DDIMER in the last 72 hours. Hgb A1c No results for input(s): HGBA1C in the last 72 hours. Lipid Profile No results for input(s): CHOL, HDL, LDLCALC, TRIG, CHOLHDL, LDLDIRECT in the last 72 hours. Thyroid function studies No results for input(s): TSH, T4TOTAL, T3FREE, THYROIDAB in the last 72 hours.  Invalid input(s): FREET3 Anemia work up  No results for input(s): VITAMINB12, FOLATE, FERRITIN, TIBC, IRON, RETICCTPCT in the last 72 hours. Urinalysis    Component Value Date/Time   COLORURINE YELLOW 05/18/2021 2114   APPEARANCEUR HAZY (A) 05/18/2021  2114   LABSPEC 1.009 05/18/2021 2114   PHURINE 6.0 05/18/2021 2114   GLUCOSEU NEGATIVE 05/18/2021 2114   HGBUR NEGATIVE 05/18/2021 2114   BILIRUBINUR NEGATIVE 05/18/2021 2114   KETONESUR NEGATIVE 05/18/2021 2114   PROTEINUR NEGATIVE 05/18/2021 2114   UROBILINOGEN 0.2 03/08/2013 0916   NITRITE NEGATIVE 05/18/2021 2114   LEUKOCYTESUR LARGE (A) 05/18/2021 2114   Sepsis Labs Invalid input(s): PROCALCITONIN,  WBC,  LACTICIDVEN Microbiology Recent Results (from the past 240 hour(s))  Resp Panel by RT-PCR (Flu A&B, Covid) Nasopharyngeal Swab     Status: None   Collection Time: 06/18/21  9:25 PM   Specimen: Nasopharyngeal Swab; Nasopharyngeal(NP) swabs in vial transport medium  Result Value Ref Range Status   SARS Coronavirus 2 by RT PCR NEGATIVE NEGATIVE Final    Comment: (NOTE) SARS-CoV-2 target nucleic acids are NOT DETECTED.  The SARS-CoV-2 RNA is generally detectable in upper respiratory specimens during the acute phase of infection. The lowest concentration of SARS-CoV-2 viral copies this assay can detect is 138 copies/mL. A negative result does not preclude SARS-Cov-2 infection and should not be used as the sole basis for treatment or other patient management decisions. A negative result may occur with  improper specimen collection/handling, submission of specimen other than nasopharyngeal swab, presence of viral mutation(s) within the areas targeted by this assay, and inadequate number of viral copies(<138 copies/mL). A negative result must be combined with clinical observations, patient history, and epidemiological information. The expected result is Negative.  Fact Sheet for Patients:  EntrepreneurPulse.com.au  Fact Sheet for Healthcare Providers:  IncredibleEmployment.be  This test is no t yet approved or cleared by the [place] FDA and  has been authorized for detection and/or diagnosis of SARS-CoV-2 by FDA under an Emergency  Use Authorization (EUA). This EUA will remain  in effect (meaning this test can be used) for the duration of the COVID-19 declaration under Section 564(b)(1) of the Act, 21 U.S.C.section 360bbb-3(b)(1), unless the authorization is terminated  or revoked sooner.       Influenza A by PCR NEGATIVE NEGATIVE Final   Influenza B by PCR NEGATIVE NEGATIVE Final    Comment: (NOTE) The Xpert Xpress SARS-CoV-2/FLU/RSV plus assay is intended as an aid in the diagnosis of influenza from Nasopharyngeal swab specimens and should not be used as a sole basis for treatment. Nasal washings and aspirates are unacceptable for Xpert Xpress SARS-CoV-2/FLU/RSV testing.  Fact Sheet for Patients: EntrepreneurPulse.com.au  Fact Sheet for Healthcare Providers: IncredibleEmployment.be  This test is not yet approved or cleared by the [place] FDA and has been authorized for detection and/or diagnosis of SARS-CoV-2 by FDA under an Emergency Use Authorization (EUA). This EUA will remain in effect (meaning this test can be used) for the duration of the COVID-19 declaration under Section 564(b)(1) of the Act, 21 U.S.C. section 360bbb-3(b)(1), unless the authorization is terminated or revoked.  Performed at [facility], [address] [place], [place] [address]   MRSA Next Gen by PCR, Nasal     Status: None   Collection Time: 06/19/21  1:11 AM  Result Value Ref Range Status   MRSA by PCR Next Gen NOT DETECTED NOT DETECTED Final    Comment: (NOTE) The GeneXpert MRSA Assay (FDA approved for NASAL specimens only), is one component of a  comprehensive MRSA colonization surveillance program. It is not intended to diagnose MRSA infection nor to guide or monitor treatment for MRSA infections. Test performance is not FDA approved in patients less than 21 years old. Performed at [facility], [place] [address] [place], [place] [address]      Time  coordinating discharge: Over 30 minutes  SIGNED:   Little Ishikawa, DO [facility] 06/22/2021, 7:29 PM Pager   If 7PM-7AM, please contact night-coverage [email or web address]

## 2021-06-22 NOTE — Plan of Care (Signed)
  Problem: Education: Goal: Knowledge of General Education information will improve Description: Including pain rating scale, medication(s)/side effects and non-pharmacologic comfort measures Outcome: Adequate for Discharge   

## 2021-06-25 DIAGNOSIS — I10 Essential (primary) hypertension: Secondary | ICD-10-CM | POA: Diagnosis not present

## 2021-06-25 DIAGNOSIS — R1310 Dysphagia, unspecified: Secondary | ICD-10-CM | POA: Diagnosis not present

## 2021-06-25 DIAGNOSIS — R0989 Other specified symptoms and signs involving the circulatory and respiratory systems: Secondary | ICD-10-CM | POA: Diagnosis not present

## 2021-06-25 DIAGNOSIS — R600 Localized edema: Secondary | ICD-10-CM | POA: Diagnosis not present

## 2021-06-25 DIAGNOSIS — E871 Hypo-osmolality and hyponatremia: Secondary | ICD-10-CM | POA: Diagnosis not present

## 2021-06-28 ENCOUNTER — Ambulatory Visit: Payer: Medicare Other | Admitting: Cardiology

## 2021-06-28 ENCOUNTER — Encounter: Payer: Self-pay | Admitting: Cardiology

## 2021-06-28 ENCOUNTER — Other Ambulatory Visit: Payer: Self-pay

## 2021-06-28 VITALS — BP 159/72 | HR 63 | Temp 98.2°F | Resp 16 | Ht 59.0 in | Wt 177.6 lb

## 2021-06-28 DIAGNOSIS — I10 Essential (primary) hypertension: Secondary | ICD-10-CM

## 2021-06-28 DIAGNOSIS — R54 Age-related physical debility: Secondary | ICD-10-CM | POA: Diagnosis not present

## 2021-06-28 DIAGNOSIS — I452 Bifascicular block: Secondary | ICD-10-CM | POA: Diagnosis not present

## 2021-06-28 DIAGNOSIS — G459 Transient cerebral ischemic attack, unspecified: Secondary | ICD-10-CM | POA: Diagnosis not present

## 2021-06-28 DIAGNOSIS — R0989 Other specified symptoms and signs involving the circulatory and respiratory systems: Secondary | ICD-10-CM | POA: Diagnosis not present

## 2021-06-28 DIAGNOSIS — E871 Hypo-osmolality and hyponatremia: Secondary | ICD-10-CM

## 2021-06-28 NOTE — Progress Notes (Signed)
Primary Physician/Referring:  [name], MD  Patient ID: Deborah Jordan, female    DOB: 17-Jun-1933, 87 y.o.   MRN: [id number]  Chief Complaint  Patient presents with   Fluid around heart and lungs   Hospitalization Follow-up   HPI:    Deborah Jordan  is a 87 y.o. wwith hypertension, hyperlipidemia, history of chronic kidney disease, hyperglycemia, bifascicular block on EKG, right carotid bruit, carotid duplex in 2018 revealing no significant disease.  She has chronic palpitations which are improved with beta-blocker therapy.  Patient was seen in the emergency room on 06/18/2021 and admitted to the hospital and discharged on 06/22/2021 with generalized fatigue and weakness and found to have severe hyponatremia with a sodium of 111.  Hyponatremia was felt to be dilutional due to patient having difficulty with swallowing and poor p.o. intake and was mostly excessive amounts of drinking water, she was also having some diarrhea.  BNP was minimally elevated and she has chronic bilateral ankle edema.   No chest pain, no dyspnea, she has started to feeling better and fatigue has improved.  She had an episode of dysarthria and confusional state in May 2022 and was seen in the emergency room but could not wait long and walked out of the ED and went home.  She has not had any recurrence.  It was a witnessed episode.  Her daughter is present at the bedside today.  Past Medical History:  Diagnosis Date   Anxiety    Arthritis    osteoarthritis. spinal stenosis. Scoliosis of spine-degenerative spine.   Bilateral cataracts    Chronic kidney disease    STAGE 4   DDD (degenerative disc disease), lumbar    Dyspnea    GERD (gastroesophageal reflux disease)    controls with Nexium   Grade I diastolic dysfunction 12/75/1700   Noted on ECHO   History of cardiomegaly    History of gallstones    Hypertension    LVH (left ventricular hypertrophy) 05/04/2017   Mil, noted on ECHO   Pre-diabetes    RBBB  (right bundle branch block)    Spinal stenosis    Wears partial dentures    Past Surgical History:  Procedure Laterality Date   CATARACT EXTRACTION Bilateral 03/06/2013   CHOLECYSTECTOMY     COLONOSCOPY     CRYOTHERAPY     DILATION AND CURETTAGE OF UTERUS     HEMORRHOID SURGERY N/A 09/21/2018   Procedure: SINGLE COLUMN HEMORRHOIDECTOMY, HEMORRHOIDPEXY;  Surgeon: Leighton Ruff, MD;  Location: [facility];  Service: General;  Laterality: N/A;   TOTAL HIP ARTHROPLASTY Left 03/08/2013   Procedure: LEFT TOTAL HIP ARTHROPLASTY ANTERIOR APPROACH;  Surgeon: Mcarthur Rossetti, MD;  Location: [facility];  Service: Orthopedics;  Laterality: Left;   Social History   Tobacco Use   Smoking status: Never   Smokeless tobacco: Never  Substance Use Topics   Alcohol use: Yes    Comment: OCC    ROS  Review of Systems  Constitutional: Positive for malaise/fatigue.  Cardiovascular:  Negative for chest pain, dyspnea on exertion, leg swelling (mild) and palpitations.  Gastrointestinal:  Negative for melena.  Objective  Blood pressure (!) 159/72, pulse 63, temperature 98.2 F (36.8 C), temperature source Temporal, resp. rate 16, height _0  (1.499 m), weight 177 lb 9.6 oz (80.6 kg), SpO2 95 %.  Vitals with BMI 06/28/2021 06/22/2021 06/22/2021  Height _1  - -  Weight 177 lbs 10 oz - -  BMI 35.85 - -  Systolic 491 791 505  Diastolic 72 55 53  Pulse 63 64 56     Physical Exam Constitutional:      Comments: Short stature and moderately obese  Neck:     Vascular: No carotid bruit (Right carotid bruit) or JVD.  Cardiovascular:     Rate and Rhythm: Normal rate and regular rhythm.     Pulses:          Dorsalis pedis pulses are 1+ on the right side and 1+ on the left side.       Posterior tibial pulses are 0 on the right side and 0 on the left side.     Heart sounds: Murmur heard.  Early systolic murmur is present with a grade of 2/6 radiating to the apex.  Pulmonary:     Effort:  Pulmonary effort is normal. No accessory muscle usage or respiratory distress.     Breath sounds: Normal breath sounds.  Abdominal:     General: Bowel sounds are normal.     Palpations: Abdomen is soft.  Musculoskeletal:        General: Swelling (Bilateral 1-2+ ankle edema) present. Normal range of motion.   Laboratory examination:   External labs  11/05/2019 :   Labs 06/08/2020:   Serum glucose 98 mg, BUN 21, creatinine 0.98, EGFR 54 mL, sodium 135, potassium 4.6, CMP otherwise normal.  A1c 6.2%.  Hb 12.6/HCT 37.5, platelets 470.   Cholesterol, total 145.000 11/05/2019 HDL 53.000 11/05/2019 LDL 75.000 11/05/2019 Triglycerides 87.000 11/05/2019 A1C 6.100 11/05/2019 Hemoglobin 12.600 11/05/2019 Creatinine, Serum 1.050 11/05/2019 Potassium 4.800 11/05/2019 Magnesium N/D ALT (SGPT) 10.000 11/05/2019 TSH 2.240 11/05/2019  Medications and allergies   Allergies  Allergen Reactions   Ace Inhibitors Cough    Current Outpatient Medications on File Prior to Visit  Medication Sig Dispense Refill   acetaminophen (TYLENOL) 650 MG CR tablet Take 650 mg by mouth every 8 (eight) hours as needed for pain.     amLODipine (NORVASC) 10 MG tablet Take 10 mg by mouth daily.     aspirin EC 81 MG tablet Take 81 mg by mouth daily. Swallow whole.     atorvastatin (LIPITOR) 10 MG tablet Take 10 mg by mouth daily.     carvedilol (COREG) 12.5 MG tablet Take 12.5 mg by mouth 2 (two) times daily with a meal.     Cholecalciferol (VITAMIN D) 2000 units CAPS Take 2,000 Units by mouth daily.     DULoxetine (CYMBALTA) 20 MG capsule Take 1 capsule (20 mg total) by mouth daily. 30 capsule 3   esomeprazole (NEXIUM) 40 MG capsule Take 40 mg by mouth daily.     gabapentin (NEURONTIN) 100 MG capsule Take 1 capsule (100 mg total) by mouth at bedtime. 30 capsule 3   loratadine (CLARITIN) 10 MG tablet Take 10 mg by mouth daily.     sodium chloride 1 g tablet Take 1 tablet (1 g total) by mouth 3 (three) times  daily with meals. 90 tablet 1   furosemide (LASIX) 40 MG tablet Take 40 mg by mouth daily. (Patient not taking: Reported on 06/28/2021)     No current facility-administered medications on file prior to visit.     Radiology:  No results found.  Cardiac Studies:   Carotid Doppler 07/12/2016: Minimal stenosis in the bilateral internal carotid artery (minimal) with mild mixed plaque. Antegrade vertebral artery flow. Compared to 07/02/15, right ICA stenosis of < 49% no longer evident. Further studies if clinically indicated.  EKG:  EKG 01/26/2021: Normal sinus rhythm at rate of 70 bpm, left axis deviation, left anterior fascicular block.  Right bundle branch block.  Bifascicular block.  Poor R wave progression, cannot exclude anteroseptal infarct old.   No significant change from EKG 01/17/2020   Assessment     ICD-10-CM   1. Hyponatremia  E87.1     2. Bifascicular block  I45.2 EKG 12-Lead    3. Right carotid bruit  R09.89 PCV CAROTID DUPLEX (BILATERAL)    4. TIA (transient ischemic attack) May 2022  G45.9 PCV CAROTID DUPLEX (BILATERAL)    5. Primary hypertension  I10       No orders of the defined types were placed in this encounter.  Medications Discontinued During This Encounter  Medication Reason   ALPRAZolam (XANAX) 0.25 MG tablet Error     Recommendations:   Deborah Jordan  is a 87 y.o.  with hypertension, hyperlipidemia, history of chronic kidney disease, hyperglycemia, bifascicular block on EKG, right carotid bruit, carotid duplex in 2018 revealing no significant disease.  She has chronic palpitations which are improved with beta-blocker therapy.  Patient was seen in the emergency room on 06/18/2021 and admitted to the hospital and discharged on 06/22/2021 with generalized fatigue and weakness and found to have severe hyponatremia with a sodium of 111.  Hyponatremia was felt to be dilutional due to patient having difficulty with swallowing and poor p.o. intake and was mostly  excessive amounts of drinking water, she was also having some diarrhea.  BNP was minimally elevated and she has chronic bilateral ankle edema.   I reviewed her extensive records, SIADH was not probably ruled out, TSH, serum cortisol was normal.  Serum osmolality was reduced.  She was discharged home with total sodium of 120 M EQ and low potassium at 3.4.  She now presents for follow-up of posthospitalization and follow-up of mildly elevated BNP.  I do not suspect volume overload state to be the reason for her low sodium.  She may have chronic hyponatremia but I do not have labs since June 2021, where her sodium level was normal.  Hence reset hyponatremia is unlikely.  Would probably do complete evaluation for low sodium.  She was seen by her PCPs subordinate recently, I do not have recent labs but was told sodium has improved minimally from 120-122 and she was prescribed Lasix 40 mg daily.  Repeat BMP has been ordered and she has an appointment to see our PCP Dr. [name] the following week which I have highly recommended that patient keep her appointment.  Patient's daughter is present at the bedside.  No new EKG changes, she does have bifascicular block but asymptomatic.  With regard to minimally elevated BNP, no clinical evidence of heart failure, lungs are clear, she denied any dyspnea, no PND or orthopnea, except for minimal ankle edema which is chronic related to her poor functional capacity and mostly sitting in a chair and dependent edema do not suspect decompensated heart failure to explain this degree of hyponatremia.  Patient's daughter also states that she had an episode of dysarthria and confusional state that occurred in May 2022 suggestive of TIA.  She has a faint right carotid bruit that was not previously appreciated.  She has been scheduled for MRI of her brain tomorrow.  I will also schedule her for carotid artery duplex.  Unless this is abnormal no further evaluation from cardiac  standpoint is indicated.  If she has recurrence of TIA-like symptoms and if carotid  study is negative, then could consider loop recorder implantation for evaluation of PAF.  Hence from cardiac standpoint, she is stable and I will see her back on a as needed basis.  Her blood pressure is elevated today, she needs follow-up of her hypertension as well with making adjustment to her medications.  I spent 40 minutes in review of her chart and making complex decision making.   Adrian Prows, MD, [facility] 06/28/2021, 10:23 PM Office: [phone] Pager: [phone]

## 2021-06-29 ENCOUNTER — Ambulatory Visit
Admission: RE | Admit: 2021-06-29 | Discharge: 2021-06-29 | Disposition: A | Discharge status: Home / Self Care | Payer: Medicare Other | Source: Ambulatory Visit | Attending: Family Medicine | Admitting: Family Medicine

## 2021-06-29 DIAGNOSIS — R410 Disorientation, unspecified: Secondary | ICD-10-CM

## 2021-07-02 DIAGNOSIS — E871 Hypo-osmolality and hyponatremia: Secondary | ICD-10-CM | POA: Diagnosis not present

## 2021-07-06 ENCOUNTER — Ambulatory Visit: Payer: Medicare Other

## 2021-07-06 ENCOUNTER — Other Ambulatory Visit: Payer: Self-pay

## 2021-07-06 DIAGNOSIS — G459 Transient cerebral ischemic attack, unspecified: Secondary | ICD-10-CM | POA: Diagnosis not present

## 2021-07-06 DIAGNOSIS — R0989 Other specified symptoms and signs involving the circulatory and respiratory systems: Secondary | ICD-10-CM | POA: Diagnosis not present

## 2021-07-06 DIAGNOSIS — I6521 Occlusion and stenosis of right carotid artery: Secondary | ICD-10-CM

## 2021-07-09 ENCOUNTER — Other Ambulatory Visit: Payer: Self-pay

## 2021-07-09 ENCOUNTER — Ambulatory Visit
Admission: RE | Admit: 2021-07-09 | Discharge: 2021-07-09 | Disposition: A | Discharge status: Home / Self Care | Payer: Medicare Other | Source: Ambulatory Visit | Attending: Family Medicine | Admitting: Family Medicine

## 2021-07-09 DIAGNOSIS — R410 Disorientation, unspecified: Secondary | ICD-10-CM | POA: Diagnosis not present

## 2021-07-09 DIAGNOSIS — I6782 Cerebral ischemia: Secondary | ICD-10-CM | POA: Diagnosis not present

## 2021-07-09 DIAGNOSIS — G319 Degenerative disease of nervous system, unspecified: Secondary | ICD-10-CM | POA: Diagnosis not present

## 2021-07-09 DIAGNOSIS — R296 Repeated falls: Secondary | ICD-10-CM | POA: Diagnosis not present

## 2021-07-11 NOTE — Progress Notes (Signed)
I will set her up to see me back in the office in 1 year. F/U hypertension and carotid stenosis after duplex

## 2021-07-12 DIAGNOSIS — M6281 Muscle weakness (generalized): Secondary | ICD-10-CM | POA: Diagnosis not present

## 2021-07-12 DIAGNOSIS — E871 Hypo-osmolality and hyponatremia: Secondary | ICD-10-CM | POA: Diagnosis not present

## 2021-07-12 DIAGNOSIS — E785 Hyperlipidemia, unspecified: Secondary | ICD-10-CM | POA: Diagnosis not present

## 2021-07-12 DIAGNOSIS — N184 Chronic kidney disease, stage 4 (severe): Secondary | ICD-10-CM | POA: Diagnosis not present

## 2021-07-12 DIAGNOSIS — R0989 Other specified symptoms and signs involving the circulatory and respiratory systems: Secondary | ICD-10-CM | POA: Diagnosis not present

## 2021-07-12 DIAGNOSIS — R54 Age-related physical debility: Secondary | ICD-10-CM | POA: Diagnosis not present

## 2021-07-12 DIAGNOSIS — I129 Hypertensive chronic kidney disease with stage 1 through stage 4 chronic kidney disease, or unspecified chronic kidney disease: Secondary | ICD-10-CM | POA: Diagnosis not present

## 2021-07-12 DIAGNOSIS — I452 Bifascicular block: Secondary | ICD-10-CM | POA: Diagnosis not present

## 2021-07-12 DIAGNOSIS — Z8673 Personal history of transient ischemic attack (TIA), and cerebral infarction without residual deficits: Secondary | ICD-10-CM | POA: Diagnosis not present

## 2021-07-13 DIAGNOSIS — R0989 Other specified symptoms and signs involving the circulatory and respiratory systems: Secondary | ICD-10-CM | POA: Diagnosis not present

## 2021-07-13 DIAGNOSIS — I129 Hypertensive chronic kidney disease with stage 1 through stage 4 chronic kidney disease, or unspecified chronic kidney disease: Secondary | ICD-10-CM | POA: Diagnosis not present

## 2021-07-13 DIAGNOSIS — I452 Bifascicular block: Secondary | ICD-10-CM | POA: Diagnosis not present

## 2021-07-13 DIAGNOSIS — E785 Hyperlipidemia, unspecified: Secondary | ICD-10-CM | POA: Diagnosis not present

## 2021-07-13 DIAGNOSIS — R54 Age-related physical debility: Secondary | ICD-10-CM | POA: Diagnosis not present

## 2021-07-13 DIAGNOSIS — Z8673 Personal history of transient ischemic attack (TIA), and cerebral infarction without residual deficits: Secondary | ICD-10-CM | POA: Diagnosis not present

## 2021-07-13 DIAGNOSIS — N184 Chronic kidney disease, stage 4 (severe): Secondary | ICD-10-CM | POA: Diagnosis not present

## 2021-07-13 DIAGNOSIS — E871 Hypo-osmolality and hyponatremia: Secondary | ICD-10-CM | POA: Diagnosis not present

## 2021-07-13 DIAGNOSIS — M6281 Muscle weakness (generalized): Secondary | ICD-10-CM | POA: Diagnosis not present

## 2021-07-14 DIAGNOSIS — Z8673 Personal history of transient ischemic attack (TIA), and cerebral infarction without residual deficits: Secondary | ICD-10-CM | POA: Diagnosis not present

## 2021-07-14 DIAGNOSIS — I129 Hypertensive chronic kidney disease with stage 1 through stage 4 chronic kidney disease, or unspecified chronic kidney disease: Secondary | ICD-10-CM | POA: Diagnosis not present

## 2021-07-14 DIAGNOSIS — E785 Hyperlipidemia, unspecified: Secondary | ICD-10-CM | POA: Diagnosis not present

## 2021-07-14 DIAGNOSIS — E871 Hypo-osmolality and hyponatremia: Secondary | ICD-10-CM | POA: Diagnosis not present

## 2021-07-14 DIAGNOSIS — R54 Age-related physical debility: Secondary | ICD-10-CM | POA: Diagnosis not present

## 2021-07-14 DIAGNOSIS — I452 Bifascicular block: Secondary | ICD-10-CM | POA: Diagnosis not present

## 2021-07-14 DIAGNOSIS — M6281 Muscle weakness (generalized): Secondary | ICD-10-CM | POA: Diagnosis not present

## 2021-07-14 DIAGNOSIS — N184 Chronic kidney disease, stage 4 (severe): Secondary | ICD-10-CM | POA: Diagnosis not present

## 2021-07-14 DIAGNOSIS — R0989 Other specified symptoms and signs involving the circulatory and respiratory systems: Secondary | ICD-10-CM | POA: Diagnosis not present

## 2021-07-15 ENCOUNTER — Other Ambulatory Visit: Payer: Self-pay

## 2021-07-15 ENCOUNTER — Encounter: Payer: Self-pay | Admitting: Specialist

## 2021-07-15 ENCOUNTER — Ambulatory Visit: Payer: Medicare Other | Admitting: Specialist

## 2021-07-15 VITALS — BP 149/79 | HR 70 | Ht 59.0 in | Wt 177.6 lb

## 2021-07-15 DIAGNOSIS — M5136 Other intervertebral disc degeneration, lumbar region: Secondary | ICD-10-CM

## 2021-07-15 DIAGNOSIS — M48062 Spinal stenosis, lumbar region with neurogenic claudication: Secondary | ICD-10-CM | POA: Diagnosis not present

## 2021-07-15 DIAGNOSIS — M4726 Other spondylosis with radiculopathy, lumbar region: Secondary | ICD-10-CM

## 2021-07-15 DIAGNOSIS — M4156 Other secondary scoliosis, lumbar region: Secondary | ICD-10-CM | POA: Diagnosis not present

## 2021-07-15 DIAGNOSIS — M7062 Trochanteric bursitis, left hip: Secondary | ICD-10-CM

## 2021-07-15 MED ORDER — TRAMADOL-ACETAMINOPHEN 37.5-325 MG PO TABS: 1.0000 | ORAL_TABLET | Freq: Four times a day (QID) | ORAL | 0 refills | Status: DC | PRN

## 2021-07-15 NOTE — Patient Instructions (Addendum)
Avoid bending, stooping and avoid lifting weights greater than 10 lbs. Avoid prolong standing and walking. Avoid frequent bending and stooping  No lifting greater than 10 lbs. May use ice or moist heat for pain. Weight loss is of benefit. Handicap license is approved. Dr. Romona Curls secretary/Assistant will call to arrange for epidural steroid injection   Walk to tolerance and perform lumbar flexion exercises. Should avoid all exercises that extend and rotate the spine, flexion with stomach crunchs, leg lifts and Avoiding reverse curls and bridges to prevent exacerbation of spinal stenosis of the lumbar spine.  Stationary bicycle for aerobic exercise.  Gabapentin 100 mg po qhs and cymbalta 20 mg po daily. No NSAIDs due to renal disease.

## 2021-07-15 NOTE — Progress Notes (Addendum)
Office Visit Note   Patient: Deborah Jordan           Date of Birth: [birth year]           MRN: [id number] Visit Date: 07/15/2021              Requested by: [name], MD [address] [place],  [place] [address] PCP: [name], MD   Assessment & Plan: Visit Diagnoses:  1. Spinal stenosis of lumbar region with neurogenic claudication   2. Other spondylosis with radiculopathy, lumbar region   3. Other secondary scoliosis, lumbar region   4. Degenerative disc disease, lumbar   5. Trochanteric bursitis of left hip     Plan: Avoid bending, stooping and avoid lifting weights greater than 10 lbs. Avoid prolong standing and walking. Avoid frequent bending and stooping  No lifting greater than 10 lbs. May use ice or moist heat for pain. Weight loss is of benefit. Handicap license is approved. Dr. Romona Curls secretary/Assistant will call to arrange for epidural steroid injection   Walk to tolerance and perform lumbar flexion exercises. Should avoid all exercises that extend and rotate the spine, flexion with stomach crunchs, leg lifts and Avoiding reverse curls and bridges to prevent exacerbation of spinal stenosis of the lumbar spine.  Stationary bicycle for aerobic exercise.  Gabapentin 100 mg po qhs and cymbalta 20 mg po daily. No NSAIDs due to renal disease.   Follow-Up Instructions: Return in about 3 months (around 10/15/2021).   Orders:  Orders Placed This Encounter  Procedures   Ambulatory referral to Physical Medicine Rehab   No orders of the defined types were placed in this encounter.     Procedures: No procedures performed   Clinical Data: No additional findings.   Subjective: Chief Complaint  Patient presents with   Lower Back - Follow-up    Had Bilat L3-4 TF injection with Dr. Ernestina Patches on 06/16/21, she states that she got 100% relief till she fell on 06/30/21, she states that she started having pain with getting up and sitting down,  Did PT but says that it didn't hurt till later that evening and it was bad    87 year old female with history of spinal stenosis symptoms, and spinal narrowing L3-4 she underwent bilateral transforaminal ESIs and these helped. The gabapentin and cymbalta also seem to help. She was hospitalized with low Na 118 and got down to 116. She was treated and  more recent testing shows that Her Na is better.   Review of Systems  Constitutional: Negative.   HENT: Negative.    Eyes: Negative.   Respiratory: Negative.    Cardiovascular: Negative.   Gastrointestinal: Negative.   Endocrine: Negative.   Genitourinary: Negative.   Musculoskeletal: Negative.   Skin: Negative.   Allergic/Immunologic: Negative.   Neurological: Negative.   Hematological: Negative.   Psychiatric/Behavioral: Negative.      Objective: Vital Signs: BP (!) 149/79 (BP Location: Left Arm, Patient Position: Sitting)   Pulse 70   Ht 4\' 11"  (1.499 m)   Wt 177 lb 9.6 oz (80.6 kg)   BMI 35.87 kg/m   Physical Exam Constitutional:      Appearance: She is well-developed.  HENT:     Head: Normocephalic and atraumatic.  Eyes:     Pupils: Pupils are equal, round, and reactive to light.  Pulmonary:     Effort: Pulmonary effort is normal.     Breath sounds: Normal breath sounds.  Abdominal:  General: Bowel sounds are normal.     Palpations: Abdomen is soft.  Musculoskeletal:        General: Normal range of motion.     Cervical back: Normal range of motion and neck supple.     Lumbar back: Negative right straight leg raise test and negative left straight leg raise test.  Skin:    General: Skin is warm and dry.  Neurological:     Mental Status: She is alert and oriented to person, place, and time.  Psychiatric:        Behavior: Behavior normal.        Thought Content: Thought content normal.        Judgment: Judgment normal.    Back Exam   Tenderness  The patient is experiencing tenderness in the  lumbar.  Range of Motion  Lateral bend right:  normal  Lateral bend left:  normal  Rotation right:  normal  Rotation left:  normal   Muscle Strength  Right Quadriceps:  5/5  Left Quadriceps:  5/5  Right Hamstrings:  5/5  Left Hamstrings:  5/5   Tests  Straight leg raise right: negative Straight leg raise left: negative  Reflexes  Patellar:  0/4 Achilles:  0/4 Biceps:  0/4  Other  Toe walk: normal Heel walk: normal Gait: normal  Erythema: no back redness Scars: absent     Specialty Comments:  No specialty comments available.  Imaging: No results found.   PMFS History: Patient Active Problem List   Diagnosis Date Noted   Dyspnea and respiratory abnormalities    Chronic bilateral low back pain with bilateral sciatica 09/27/2017   Bilateral chronic knee pain 03/13/2017   Pleural effusion 03/10/2013   Hyponatremia 03/10/2013   Hypertension 03/10/2013   Degenerative arthritis of hip 03/08/2013   Past Medical History:  Diagnosis Date   Anxiety    Arthritis    osteoarthritis. spinal stenosis. Scoliosis of spine-degenerative spine.   Bilateral cataracts    Chronic kidney disease    STAGE 4   DDD (degenerative disc disease), lumbar    Dyspnea    GERD (gastroesophageal reflux disease)    controls with Nexium   Grade I diastolic dysfunction 16/38/4536   Noted on ECHO   History of cardiomegaly    History of gallstones    Hypertension    LVH (left ventricular hypertrophy) 05/04/2017   Mil, noted on ECHO   Pre-diabetes    RBBB (right bundle branch block)    Spinal stenosis    Wears partial dentures     Family History  Problem Relation Age of Onset   Heart disease Father    Heart failure Father    Diabetes Father    Atrial fibrillation Sister     Past Surgical History:  Procedure Laterality Date   CATARACT EXTRACTION Bilateral 03/06/2013   CHOLECYSTECTOMY     COLONOSCOPY     CRYOTHERAPY     DILATION AND CURETTAGE OF UTERUS     HEMORRHOID  SURGERY N/A 09/21/2018   Procedure: SINGLE COLUMN HEMORRHOIDECTOMY, HEMORRHOIDPEXY;  Surgeon: Leighton Ruff, MD;  Location: [facility];  Service: General;  Laterality: N/A;   TOTAL HIP ARTHROPLASTY Left 03/08/2013   Procedure: LEFT TOTAL HIP ARTHROPLASTY ANTERIOR APPROACH;  Surgeon: Mcarthur Rossetti, MD;  Location: [facility];  Service: Orthopedics;  Laterality: Left;   Social History   Occupational History   Not on file  Tobacco Use   Smoking status: Never   Smokeless tobacco: Never  Vaping Use   Vaping Use: Never used  Substance and Sexual Activity   Alcohol use: Yes    Comment: OCC   Drug use: Never   Sexual activity: Not Currently    Birth control/protection: Post-menopausal

## 2021-07-15 NOTE — Addendum Note (Signed)
Addended by: Basil Dess on: 07/15/2021 02:57 PM   Modules accepted: Orders

## 2021-07-19 DIAGNOSIS — E871 Hypo-osmolality and hyponatremia: Secondary | ICD-10-CM | POA: Diagnosis not present

## 2021-07-19 DIAGNOSIS — M6281 Muscle weakness (generalized): Secondary | ICD-10-CM | POA: Diagnosis not present

## 2021-07-19 DIAGNOSIS — R0989 Other specified symptoms and signs involving the circulatory and respiratory systems: Secondary | ICD-10-CM | POA: Diagnosis not present

## 2021-07-19 DIAGNOSIS — R54 Age-related physical debility: Secondary | ICD-10-CM | POA: Diagnosis not present

## 2021-07-19 DIAGNOSIS — I129 Hypertensive chronic kidney disease with stage 1 through stage 4 chronic kidney disease, or unspecified chronic kidney disease: Secondary | ICD-10-CM | POA: Diagnosis not present

## 2021-07-19 DIAGNOSIS — N184 Chronic kidney disease, stage 4 (severe): Secondary | ICD-10-CM | POA: Diagnosis not present

## 2021-07-19 DIAGNOSIS — Z8673 Personal history of transient ischemic attack (TIA), and cerebral infarction without residual deficits: Secondary | ICD-10-CM | POA: Diagnosis not present

## 2021-07-19 DIAGNOSIS — E785 Hyperlipidemia, unspecified: Secondary | ICD-10-CM | POA: Diagnosis not present

## 2021-07-19 DIAGNOSIS — I452 Bifascicular block: Secondary | ICD-10-CM | POA: Diagnosis not present

## 2021-07-21 DIAGNOSIS — R54 Age-related physical debility: Secondary | ICD-10-CM | POA: Diagnosis not present

## 2021-07-21 DIAGNOSIS — Z8673 Personal history of transient ischemic attack (TIA), and cerebral infarction without residual deficits: Secondary | ICD-10-CM | POA: Diagnosis not present

## 2021-07-21 DIAGNOSIS — I452 Bifascicular block: Secondary | ICD-10-CM | POA: Diagnosis not present

## 2021-07-21 DIAGNOSIS — N184 Chronic kidney disease, stage 4 (severe): Secondary | ICD-10-CM | POA: Diagnosis not present

## 2021-07-21 DIAGNOSIS — R0989 Other specified symptoms and signs involving the circulatory and respiratory systems: Secondary | ICD-10-CM | POA: Diagnosis not present

## 2021-07-21 DIAGNOSIS — E785 Hyperlipidemia, unspecified: Secondary | ICD-10-CM | POA: Diagnosis not present

## 2021-07-21 DIAGNOSIS — M6281 Muscle weakness (generalized): Secondary | ICD-10-CM | POA: Diagnosis not present

## 2021-07-21 DIAGNOSIS — I129 Hypertensive chronic kidney disease with stage 1 through stage 4 chronic kidney disease, or unspecified chronic kidney disease: Secondary | ICD-10-CM | POA: Diagnosis not present

## 2021-07-21 DIAGNOSIS — E871 Hypo-osmolality and hyponatremia: Secondary | ICD-10-CM | POA: Diagnosis not present

## 2021-07-23 DIAGNOSIS — E271 Primary adrenocortical insufficiency: Secondary | ICD-10-CM | POA: Diagnosis not present

## 2021-07-27 ENCOUNTER — Other Ambulatory Visit: Payer: Self-pay

## 2021-07-27 ENCOUNTER — Ambulatory Visit
Admission: RE | Admit: 2021-07-27 | Discharge: 2021-07-27 | Disposition: A | Discharge status: Home / Self Care | Payer: Medicare Other | Source: Ambulatory Visit | Attending: Internal Medicine | Admitting: Internal Medicine

## 2021-07-27 ENCOUNTER — Other Ambulatory Visit: Payer: Self-pay | Admitting: Internal Medicine

## 2021-07-27 DIAGNOSIS — M545 Low back pain, unspecified: Secondary | ICD-10-CM

## 2021-07-27 DIAGNOSIS — E871 Hypo-osmolality and hyponatremia: Secondary | ICD-10-CM | POA: Diagnosis not present

## 2021-07-28 DIAGNOSIS — Z8673 Personal history of transient ischemic attack (TIA), and cerebral infarction without residual deficits: Secondary | ICD-10-CM | POA: Diagnosis not present

## 2021-07-28 DIAGNOSIS — N184 Chronic kidney disease, stage 4 (severe): Secondary | ICD-10-CM | POA: Diagnosis not present

## 2021-07-28 DIAGNOSIS — M6281 Muscle weakness (generalized): Secondary | ICD-10-CM | POA: Diagnosis not present

## 2021-07-28 DIAGNOSIS — I452 Bifascicular block: Secondary | ICD-10-CM | POA: Diagnosis not present

## 2021-07-28 DIAGNOSIS — E871 Hypo-osmolality and hyponatremia: Secondary | ICD-10-CM | POA: Diagnosis not present

## 2021-07-28 DIAGNOSIS — I129 Hypertensive chronic kidney disease with stage 1 through stage 4 chronic kidney disease, or unspecified chronic kidney disease: Secondary | ICD-10-CM | POA: Diagnosis not present

## 2021-07-28 DIAGNOSIS — E785 Hyperlipidemia, unspecified: Secondary | ICD-10-CM | POA: Diagnosis not present

## 2021-07-28 DIAGNOSIS — R54 Age-related physical debility: Secondary | ICD-10-CM | POA: Diagnosis not present

## 2021-07-28 DIAGNOSIS — R0989 Other specified symptoms and signs involving the circulatory and respiratory systems: Secondary | ICD-10-CM | POA: Diagnosis not present

## 2021-07-30 DIAGNOSIS — I452 Bifascicular block: Secondary | ICD-10-CM | POA: Diagnosis not present

## 2021-07-30 DIAGNOSIS — N184 Chronic kidney disease, stage 4 (severe): Secondary | ICD-10-CM | POA: Diagnosis not present

## 2021-07-30 DIAGNOSIS — I129 Hypertensive chronic kidney disease with stage 1 through stage 4 chronic kidney disease, or unspecified chronic kidney disease: Secondary | ICD-10-CM | POA: Diagnosis not present

## 2021-07-30 DIAGNOSIS — Z8673 Personal history of transient ischemic attack (TIA), and cerebral infarction without residual deficits: Secondary | ICD-10-CM | POA: Diagnosis not present

## 2021-07-30 DIAGNOSIS — E785 Hyperlipidemia, unspecified: Secondary | ICD-10-CM | POA: Diagnosis not present

## 2021-07-30 DIAGNOSIS — R0989 Other specified symptoms and signs involving the circulatory and respiratory systems: Secondary | ICD-10-CM | POA: Diagnosis not present

## 2021-07-30 DIAGNOSIS — E871 Hypo-osmolality and hyponatremia: Secondary | ICD-10-CM | POA: Diagnosis not present

## 2021-07-30 DIAGNOSIS — R54 Age-related physical debility: Secondary | ICD-10-CM | POA: Diagnosis not present

## 2021-07-30 DIAGNOSIS — M6281 Muscle weakness (generalized): Secondary | ICD-10-CM | POA: Diagnosis not present

## 2021-08-02 DIAGNOSIS — R0989 Other specified symptoms and signs involving the circulatory and respiratory systems: Secondary | ICD-10-CM | POA: Diagnosis not present

## 2021-08-02 DIAGNOSIS — E871 Hypo-osmolality and hyponatremia: Secondary | ICD-10-CM | POA: Diagnosis not present

## 2021-08-02 DIAGNOSIS — Z8673 Personal history of transient ischemic attack (TIA), and cerebral infarction without residual deficits: Secondary | ICD-10-CM | POA: Diagnosis not present

## 2021-08-02 DIAGNOSIS — E785 Hyperlipidemia, unspecified: Secondary | ICD-10-CM | POA: Diagnosis not present

## 2021-08-02 DIAGNOSIS — M6281 Muscle weakness (generalized): Secondary | ICD-10-CM | POA: Diagnosis not present

## 2021-08-02 DIAGNOSIS — I129 Hypertensive chronic kidney disease with stage 1 through stage 4 chronic kidney disease, or unspecified chronic kidney disease: Secondary | ICD-10-CM | POA: Diagnosis not present

## 2021-08-02 DIAGNOSIS — N184 Chronic kidney disease, stage 4 (severe): Secondary | ICD-10-CM | POA: Diagnosis not present

## 2021-08-02 DIAGNOSIS — I452 Bifascicular block: Secondary | ICD-10-CM | POA: Diagnosis not present

## 2021-08-02 DIAGNOSIS — R54 Age-related physical debility: Secondary | ICD-10-CM | POA: Diagnosis not present

## 2021-08-03 DIAGNOSIS — M6281 Muscle weakness (generalized): Secondary | ICD-10-CM | POA: Diagnosis not present

## 2021-08-03 DIAGNOSIS — R54 Age-related physical debility: Secondary | ICD-10-CM | POA: Diagnosis not present

## 2021-08-03 DIAGNOSIS — I452 Bifascicular block: Secondary | ICD-10-CM | POA: Diagnosis not present

## 2021-08-03 DIAGNOSIS — I129 Hypertensive chronic kidney disease with stage 1 through stage 4 chronic kidney disease, or unspecified chronic kidney disease: Secondary | ICD-10-CM | POA: Diagnosis not present

## 2021-08-03 DIAGNOSIS — E871 Hypo-osmolality and hyponatremia: Secondary | ICD-10-CM | POA: Diagnosis not present

## 2021-08-03 DIAGNOSIS — N184 Chronic kidney disease, stage 4 (severe): Secondary | ICD-10-CM | POA: Diagnosis not present

## 2021-08-03 DIAGNOSIS — R0989 Other specified symptoms and signs involving the circulatory and respiratory systems: Secondary | ICD-10-CM | POA: Diagnosis not present

## 2021-08-03 DIAGNOSIS — Z8673 Personal history of transient ischemic attack (TIA), and cerebral infarction without residual deficits: Secondary | ICD-10-CM | POA: Diagnosis not present

## 2021-08-03 DIAGNOSIS — E785 Hyperlipidemia, unspecified: Secondary | ICD-10-CM | POA: Diagnosis not present

## 2021-08-04 DIAGNOSIS — I452 Bifascicular block: Secondary | ICD-10-CM | POA: Diagnosis not present

## 2021-08-04 DIAGNOSIS — Z8673 Personal history of transient ischemic attack (TIA), and cerebral infarction without residual deficits: Secondary | ICD-10-CM | POA: Diagnosis not present

## 2021-08-04 DIAGNOSIS — R54 Age-related physical debility: Secondary | ICD-10-CM | POA: Diagnosis not present

## 2021-08-04 DIAGNOSIS — N184 Chronic kidney disease, stage 4 (severe): Secondary | ICD-10-CM | POA: Diagnosis not present

## 2021-08-04 DIAGNOSIS — E871 Hypo-osmolality and hyponatremia: Secondary | ICD-10-CM | POA: Diagnosis not present

## 2021-08-04 DIAGNOSIS — R0989 Other specified symptoms and signs involving the circulatory and respiratory systems: Secondary | ICD-10-CM | POA: Diagnosis not present

## 2021-08-04 DIAGNOSIS — M6281 Muscle weakness (generalized): Secondary | ICD-10-CM | POA: Diagnosis not present

## 2021-08-04 DIAGNOSIS — E785 Hyperlipidemia, unspecified: Secondary | ICD-10-CM | POA: Diagnosis not present

## 2021-08-04 DIAGNOSIS — I129 Hypertensive chronic kidney disease with stage 1 through stage 4 chronic kidney disease, or unspecified chronic kidney disease: Secondary | ICD-10-CM | POA: Diagnosis not present

## 2021-08-09 DIAGNOSIS — I452 Bifascicular block: Secondary | ICD-10-CM | POA: Diagnosis not present

## 2021-08-09 DIAGNOSIS — M6281 Muscle weakness (generalized): Secondary | ICD-10-CM | POA: Diagnosis not present

## 2021-08-09 DIAGNOSIS — I129 Hypertensive chronic kidney disease with stage 1 through stage 4 chronic kidney disease, or unspecified chronic kidney disease: Secondary | ICD-10-CM | POA: Diagnosis not present

## 2021-08-09 DIAGNOSIS — Z8673 Personal history of transient ischemic attack (TIA), and cerebral infarction without residual deficits: Secondary | ICD-10-CM | POA: Diagnosis not present

## 2021-08-09 DIAGNOSIS — R0989 Other specified symptoms and signs involving the circulatory and respiratory systems: Secondary | ICD-10-CM | POA: Diagnosis not present

## 2021-08-09 DIAGNOSIS — R54 Age-related physical debility: Secondary | ICD-10-CM | POA: Diagnosis not present

## 2021-08-09 DIAGNOSIS — E871 Hypo-osmolality and hyponatremia: Secondary | ICD-10-CM | POA: Diagnosis not present

## 2021-08-09 DIAGNOSIS — E785 Hyperlipidemia, unspecified: Secondary | ICD-10-CM | POA: Diagnosis not present

## 2021-08-09 DIAGNOSIS — N184 Chronic kidney disease, stage 4 (severe): Secondary | ICD-10-CM | POA: Diagnosis not present

## 2021-08-17 DIAGNOSIS — Z8673 Personal history of transient ischemic attack (TIA), and cerebral infarction without residual deficits: Secondary | ICD-10-CM | POA: Diagnosis not present

## 2021-08-17 DIAGNOSIS — I452 Bifascicular block: Secondary | ICD-10-CM | POA: Diagnosis not present

## 2021-08-17 DIAGNOSIS — R0989 Other specified symptoms and signs involving the circulatory and respiratory systems: Secondary | ICD-10-CM | POA: Diagnosis not present

## 2021-08-17 DIAGNOSIS — I129 Hypertensive chronic kidney disease with stage 1 through stage 4 chronic kidney disease, or unspecified chronic kidney disease: Secondary | ICD-10-CM | POA: Diagnosis not present

## 2021-08-17 DIAGNOSIS — I10 Essential (primary) hypertension: Secondary | ICD-10-CM | POA: Diagnosis not present

## 2021-08-17 DIAGNOSIS — R54 Age-related physical debility: Secondary | ICD-10-CM | POA: Diagnosis not present

## 2021-08-17 DIAGNOSIS — M6281 Muscle weakness (generalized): Secondary | ICD-10-CM | POA: Diagnosis not present

## 2021-08-17 DIAGNOSIS — N184 Chronic kidney disease, stage 4 (severe): Secondary | ICD-10-CM | POA: Diagnosis not present

## 2021-08-17 DIAGNOSIS — E785 Hyperlipidemia, unspecified: Secondary | ICD-10-CM | POA: Diagnosis not present

## 2021-08-17 DIAGNOSIS — E871 Hypo-osmolality and hyponatremia: Secondary | ICD-10-CM | POA: Diagnosis not present

## 2021-08-24 DIAGNOSIS — E871 Hypo-osmolality and hyponatremia: Secondary | ICD-10-CM | POA: Diagnosis not present

## 2021-08-24 DIAGNOSIS — I10 Essential (primary) hypertension: Secondary | ICD-10-CM | POA: Diagnosis not present

## 2021-08-25 ENCOUNTER — Encounter: Payer: Self-pay | Admitting: *Deleted

## 2021-08-26 ENCOUNTER — Telehealth: Payer: Self-pay | Admitting: Neurology

## 2021-08-26 ENCOUNTER — Ambulatory Visit: Payer: Medicare Other | Admitting: Neurology

## 2021-08-26 ENCOUNTER — Encounter: Payer: Self-pay | Admitting: Neurology

## 2021-08-26 VITALS — BP 145/68 | HR 70 | Ht <= 58 in | Wt 173.8 lb

## 2021-08-26 DIAGNOSIS — R4789 Other speech disturbances: Secondary | ICD-10-CM | POA: Diagnosis not present

## 2021-08-26 DIAGNOSIS — R0683 Snoring: Secondary | ICD-10-CM | POA: Diagnosis not present

## 2021-08-26 DIAGNOSIS — R410 Disorientation, unspecified: Secondary | ICD-10-CM

## 2021-08-26 DIAGNOSIS — E669 Obesity, unspecified: Secondary | ICD-10-CM

## 2021-08-26 DIAGNOSIS — R0681 Apnea, not elsewhere classified: Secondary | ICD-10-CM | POA: Diagnosis not present

## 2021-08-26 DIAGNOSIS — R600 Localized edema: Secondary | ICD-10-CM

## 2021-08-26 DIAGNOSIS — E871 Hypo-osmolality and hyponatremia: Secondary | ICD-10-CM | POA: Diagnosis not present

## 2021-08-26 DIAGNOSIS — G4719 Other hypersomnia: Secondary | ICD-10-CM

## 2021-08-26 DIAGNOSIS — R351 Nocturia: Secondary | ICD-10-CM

## 2021-08-26 NOTE — Telephone Encounter (Signed)
EEG order sent to [facility] for scheduling.

## 2021-08-26 NOTE — Progress Notes (Signed)
Subjective:    Patient ID: Deborah Jordan is a 87 y.o. female.  HPI     Deborah Age, MD, PhD [facility] [address], [address] [place], [place] [address]  Dear Dr. Orland Mustard,   I saw your patient, Deborah Jordan, upon your kind request in my neurologic clinic today for initial consultation of her confusion and speech difficulty.  The patient is accompanied by her daughter today.  As you know, Deborah Jordan is an 87 year old right-handed woman with an underlying medical history of chronic kidney disease, degenerative lumbar disc disease with spinal stenosis, reflux disease, cardiomegaly, hypertension, right bundle branch block, anxiety, arthritis, and obesity, who reports recent confusion and forgetfulness but overall feeling a little better.  She has been forgetful for the past few months, daughter reports that she had noticed confusion which started somewhere in May 2022.  She has been forgetful and misplaces things but it was not alarming until she became confused and had word finding difficulty starting in May.  She was found to have hyponatremia.  She is now following with Dr. Buddy Duty through [place].  Most recent blood work on 08/24/2021 per daughter showed a sodium level of 131, prior to that it was 135 but she was still on sodium supplementation at the time.    I reviewed your office note from 05/19/21.   Of note, she was recently hospitalized 2 months ago from 06/18/2021 through 06/22/2021.  I reviewed the hospital records.  She presented with fatigue and weakness and was found to have low sodium.  She was treated symptomatically with fluid restriction and salt.  You ordered a brain MRI.  She had a brain MRI without contrast on 07/09/2021 and I reviewed the results: IMPRESSION: 1. No acute intracranial abnormality. 2. Mild chronic small vessel ischemic disease and cerebral atrophy.  When she was hospitalized in July 2022, she had numerous blood tests.  Sodium initially  was quite low, as low as 114.  Potassium was fluctuating as well, upon discharge it was mildly low at 3.4, chloride very low as well with fluctuation between 84 and 89 noted.  Creatinine stayed in the normal range during the hospitalization but BUN was fairly high on 06/20/2021 at 42.    Of note, she presented to the emergency room on 05/18/2021 with altered mental status.  She had trouble getting her words out, reported constipation for 3 days.  I reviewed the emergency room records.  She had a head CT without contrast on 05/18/2021 and I reviewed the results: IMPRESSION: 1. No acute intracranial pathology. 2. Mild Jordan-related atrophy and chronic microvascular ischemic changes. She did not stay to complete the evaluation in the ED.  Urinalysis showed large leukocytes, rare bacteria, hazy appearance.  CMP showed sodium 129, chloride 96, [place], glucose 125, mildly elevated, creatinine elevated at 1.3, CBC with differential showed elevated platelets at 460, lipase level normal at 25.  She had blood work through your office on 05/19/2021 and I reviewed the results: Glucose 112, creatinine elevated at 1.59, sodium low at 130, chloride mildly low at 97, alk phos 80, AST 17, ALT 11.  CBC with differential showed WBC of 8.6, platelets elevated at 434, elevated monocyte percentage. She recently saw Dr. Louanne Skye in follow-up on 07/15/2021 and I reviewed the office note.  Of note, she is on potentially sedating medications albeit low doses, she is on gabapentin 100 mg daily, Cymbalta 20 mg daily and tramadol as needed.  She is supposed to  schedule an epidural steroid injection.  Of note, she does have some daytime somnolence.  Her Epworth sleepiness score is 9 out of 24.  Daughter endorses that patient snores and has had apneic pauses while asleep.  She has never had a sleep study but would be willing to get checked out.  She has a family history of dementia, mom had memory loss but lived to be 92 years old, maternal  grandmother also had some degree of memory loss later in life.  Paternal grandmother did not have any memory issues and lived to be 24 or 45.  Patient is the oldest of a total of 8 siblings.  She has 5 sisters who are alive, she had 2 brothers, 1 passed away. No sibling with memory issues.  She has 1 daughter and 1 son, she lives with her daughter and daughter's family which includes patient's son-in-law and 2 granddaughters.  She has been using a rolling walker.  She tries to hydrate better, she is trying to follow fluid restrictions as instructed.  She drinks caffeine in the form of coffee, 2 cups in the mornings.  She drinks alcohol very rarely, she is a non-smoker.  She is a retired Regulatory affairs officer, she used to Automotive engineer.  Her Past Medical History Is Significant For: Past Medical History:  Diagnosis Date   Anxiety    Arthritis    osteoarthritis. spinal stenosis. Scoliosis of spine-degenerative spine.   Bilateral cataracts    CAD (coronary artery disease)    minimal, improved on right 06/2016   Chronic kidney disease    STAGE 4   DDD (degenerative disc disease), lumbar    Dyspnea    Elevated cholesterol    GERD (gastroesophageal reflux disease)    controls with Nexium   Grade I diastolic dysfunction 68/34/1962   Noted on ECHO   History of cardiomegaly    History of gallstones    Hypertension    LVH (left ventricular hypertrophy) 05/04/2017   Mil, noted on ECHO   Mild depression (HCC)    Pre-diabetes    RBBB (right bundle branch block)    Spinal stenosis    Tubular adenoma    and benign polyps   Vitamin D deficiency    Wears partial dentures     Her Past Surgical History Is Significant For: Past Surgical History:  Procedure Laterality Date   CATARACT EXTRACTION Bilateral 03/06/2013   CHOLECYSTECTOMY     COLONOSCOPY     CRYOTHERAPY     DILATION AND CURETTAGE OF UTERUS     esi     HEMORRHOID SURGERY N/A 09/21/2018   Procedure: SINGLE COLUMN HEMORRHOIDECTOMY,  HEMORRHOIDPEXY;  Surgeon: Leighton Ruff, MD;  Location: [facility];  Service: General;  Laterality: N/A;   TOTAL HIP ARTHROPLASTY Left 03/08/2013   Procedure: LEFT TOTAL HIP ARTHROPLASTY ANTERIOR APPROACH;  Surgeon: Mcarthur Rossetti, MD;  Location: [facility];  Service: Orthopedics;  Laterality: Left;    Her Family History Is Significant For: Family History  Problem Relation Jordan of Onset   Heart disease Father    Heart failure Father    Diabetes Father    Atrial fibrillation Sister     Her Social History Is Significant For: Social History   Socioeconomic History   Marital status: Widowed    Spouse name: Not on file   Number of children: 2   Years of education: Not on file   Highest education level: Not on file  Occupational History   Not  on file  Tobacco Use   Smoking status: Never   Smokeless tobacco: Never  Vaping Use   Vaping Use: Never used  Substance and Sexual Activity   Alcohol use: Yes    Comment: OCC   Drug use: Never   Sexual activity: Not Currently    Birth control/protection: Post-menopausal  Other Topics Concern   Not on file  Social History Narrative   Not on file   Social Determinants of Health   Financial Resource Strain: Not on file  Food Insecurity: Not on file  Transportation Needs: Not on file  Physical Activity: Not on file  Stress: Not on file  Social Connections: Not on file    Her Allergies Are:  Allergies  Allergen Reactions   Ace Inhibitors Cough   Hydrochlorothiazide     Low Sodium  :   Her Current Medications Are:  Outpatient Encounter Medications as of 08/26/2021  Medication Sig   acetaminophen (TYLENOL) 650 MG CR tablet Take 650 mg by mouth every 8 (eight) hours as needed for pain.   amLODipine (NORVASC) 10 MG tablet Take 10 mg by mouth daily.   aspirin EC 81 MG tablet Take 81 mg by mouth daily. Swallow whole.   carvedilol (COREG) 12.5 MG tablet Take 12.5 mg by mouth 2 (two) times daily with a meal.    Cholecalciferol (VITAMIN D) 2000 units CAPS Take 2,000 Units by mouth daily.   DULoxetine (CYMBALTA) 20 MG capsule Take 1 capsule (20 mg total) by mouth daily.   esomeprazole (NEXIUM) 40 MG capsule Take 40 mg by mouth daily.   gabapentin (NEURONTIN) 100 MG capsule Take 1 capsule (100 mg total) by mouth at bedtime.   loratadine (CLARITIN) 10 MG tablet Take 10 mg by mouth daily.   furosemide (LASIX) 40 MG tablet Take 40 mg by mouth daily. (Patient not taking: Reported on 06/28/2021)   sodium chloride 1 g tablet Take 1 tablet (1 g total) by mouth 3 (three) times daily with meals.   traMADol-acetaminophen (ULTRACET) 37.5-325 MG tablet Take 1 tablet by mouth every 6 (six) hours as needed.   [DISCONTINUED] atorvastatin (LIPITOR) 10 MG tablet Take 10 mg by mouth daily.   No facility-administered encounter medications on file as of 08/26/2021.  :   Review of Systems:  Out of a complete 14 point review of systems, all are reviewed and negative with the exception of these symptoms as listed below:  Review of Systems  Neurological:        Pt is here for some confusion and memory loss. The patient is having speech difficulty pt states she knows what to say but has difficulty saying it . Pt daughter states they her PCP suspected a TIA   Pt did already have MRI .    Objective:  Neurological Exam  Physical Exam Physical Examination:   Vitals:   08/26/21 1003  BP: (!) 145/68  Pulse: 70   General Examination: The patient is a very pleasant 87 y.o. female in no acute distress. She appears well-developed and well-nourished and well groomed.   HEENT: Normocephalic, atraumatic, pupils are equal, round and reactive to light and accommodation. Funduscopic exam is normal with sharp disc margins noted. Extraocular tracking is good without limitation to gaze excursion or nystagmus noted. Normal smooth pursuit is noted. Hearing is grossly intact. Tympanic membranes are clear bilaterally. Face is symmetric with  normal facial animation and normal facial sensation. Speech is clear with no dysarthria noted. There is no hypophonia. There is  no lip, neck/head, jaw or voice tremor. Neck is supple with full range of passive and active motion. There are no carotid bruits on auscultation. Oropharynx exam reveals: mild mouth dryness, adequate dental hygiene and moderate airway crowding, due to redundant soft palate and thicker tongue.  Mallampati class III.  Tongue protrudes centrally and palate elevates symmetrically.  Chest: Clear to auscultation without wheezing, rhonchi or crackles noted.  Heart: S1+S2+0, regular and normal without murmurs, rubs or gallops noted.   Abdomen: Soft, non-tender and non-distended with normal bowel sounds appreciated on auscultation.  Extremities: There is 1+ edema in the left distal lower extremity, trace in the right.  Pitting edema in the distal lower extremities bilaterally. Pedal pulses are difficult to palpate especially on the left with foot swelling noted.    Skin: Warm and dry without trophic changes noted.  Musculoskeletal: exam reveals arthritic changes in both hands.  She is status post left hip replacement.    Neurologically:  Mental status: The patient is awake, alert and oriented in all 4 spheres. Her immediate and remote memory, attention, language skills and fund of knowledge are fairly appropriate.  She is able to answer questions well.  Her daughter provides details of her history.  Patient has no obvious dysarthria or anomia or aphasia.  Mood is normal and affect is normal.   MMSE - Mini Mental State Exam 08/26/2021  Orientation to time 5  Orientation to Place 4  Registration 3  Attention/ Calculation 1  Recall 3  Language- name 2 objects 2  Language- repeat 0  Language- follow 3 step command 3  Language- read & follow direction 1  Write a sentence 1  Copy design 1  Total score 24   On 08/26/2021: CDT: 4/4, AFT: 9/min.  Cranial nerves II - XII are as  described above under HEENT exam. In addition: shoulder shrug is normal with equal shoulder height noted. Motor exam: Normal bulk, strength and tone is noted. There is no drift, tremor or rebound. Romberg is not tested due to safety concerns, reflexes are 1+ throughout, absent in the ankles.  Toes are downgoing bilaterally.  Fine motor skills are globally mildly impaired.    Cerebellar testing: No dysmetria or intention tremor on finger to nose testing. There is no truncal or gait ataxia.  Sensory exam: intact to light touch in the upper and lower extremities.  Gait, station and balance: She stands with difficulty and pushes herself up.  She requires no assistance.  She walks with a rolling walker, no shuffling.  She maneuvers her walker well.   Assessment and Plan:   In summary, VONCILLE SIMM is a very pleasant 87 y.o.-year old female with an underlying medical history of chronic kidney disease, degenerative lumbar disc disease with spinal stenosis, reflux disease, cardiomegaly, hypertension, right bundle branch block, anxiety, arthritis, and obesity, who who presents for evaluation of her neurocognitive concerns including recent confusion.  While she does have risk factors for dementia including a family history of memory loss and also vascular risk factors, her recent cognitive concerns and more acute confusion and altered mental status probably tie-in with recent exacerbation of her metabolic and chronic medical issues.  She has had worsening kidney function in the recent past and was found to have critical hyponatremia.  She is doing better in these regards.  Memory scores are mildly abnormal.  We will continue to monitor, she is in close follow-up with her endocrinologist and also has a follow-up pending with her  primary care physician, Dr. Stephanie Acre.  I did not suggest any new medications from my end of things.  We can certainly consider medication such as donepezil in the future but for now, I would  like for her to get medically stabilized and have consistently better sodium levels.   We talked about the importance of healthy lifestyle, good nutrition, good hydration within her restrictions and physical activity.  She is advised that certain medications could put her at risk for confusion and mental fogginess including her gabapentin and Cymbalta but both are low dose and she actually has not taken her tramadol recently.  She has benefited from the Cymbalta as far as her anxiety is concerned.  I would like to proceed with an EEG to make sure there is no focal abnormality.  She has had recent scans.  Her history and exam are also concerning for underlying obstructive sleep apnea.  She is advised to proceed with a sleep study.  I talked at length with the patient and her daughter today.  We talked about the diagnosis of obstructive sleep apnea, its prognosis and treatment options.  She is willing to proceed with a sleep study and EEG.  We will call her to schedule these tests and keep him posted as to the results by phone call and plan a follow-up accordingly.  If she has obstructive sleep apnea, she would be willing to try CPAP or AutoPap therapy.  I was able to explain the treatment options to her in detail as well.  This was an extended visit of over 1 hour with copious record review.   I answered all their questions today and the patient and her daughter were in agreement with the above plan.   Thank you ve plan ry much for allowing me to participate in the care of this nice patient. If I can be of any further assistance to you please do not hesitate to call me at [phone].  Sincerely,   Deborah Age, MD, PhD

## 2021-08-26 NOTE — Patient Instructions (Addendum)
It was nice to meet you today. I believe your recent confusion and memory issues are likely connected to multiple issues including low sodium which can cause significant confusion.  You also had impairment in your kidney function.  Please note, that you are at risk for side effects from certain medications including gabapentin, Cymbalta, and tramadol.  You are on low doses of these medications however, and no longer takes the tramadol.  As I understand, the Cymbalta has actually helped her anxiety.  There is no need for change on any of these medications at this time from my end of things. Your recent MRI did not suggest any stroke or structural cause of your symptoms.  I would like to proceed with a so-called EEG (brainwave test), which we will schedule. We will call you with the results.  I doubt that you have seizures but it may help to do the EEG for reassurance. I would also recommend a sleep study to rule out obstructive sleep apnea.  You may be at risk for obstructive sleep apnea.  Please remember, the long-term risks and ramifications of untreated moderate to severe obstructive sleep apnea are: increased Cardiovascular disease, including congestive heart failure, stroke, difficult to control hypertension, treatment resistant obesity, arrhythmias, especially irregular heartbeat commonly known as A. Fib. (atrial fibrillation); even type 2 diabetes has been linked to untreated OSA.   Sleep apnea can cause disruption of sleep and sleep deprivation in most cases, which, in turn, can cause recurrent headaches, problems with memory, mood, concentration, focus, and vigilance. Most people with untreated sleep apnea report excessive daytime sleepiness, which can affect their ability to drive. Please do not drive if you feel sleepy. Patients with sleep apnea can also develop difficulty initiating and maintaining sleep (aka insomnia).   Our sleep lab administrative assistant will call you to schedule your  sleep study and give you further instructions, regarding the check in process for the sleep study, arrival time, what to bring, when you can expect to leave after the study, etc., and to answer any other logistical questions you may have. If you don't hear back from her by about 2 weeks from now, please feel free to call her direct line at [phone] or you can call our general clinic number, or email [place] through My Chart.

## 2021-09-07 ENCOUNTER — Other Ambulatory Visit (HOSPITAL_COMMUNITY): Payer: Self-pay | Admitting: *Deleted

## 2021-09-07 ENCOUNTER — Ambulatory Visit (HOSPITAL_COMMUNITY)
Admission: RE | Admit: 2021-09-07 | Discharge: 2021-09-07 | Disposition: A | Discharge status: Home / Self Care | Payer: Medicare Other | Source: Ambulatory Visit | Attending: Family Medicine | Admitting: Family Medicine

## 2021-09-07 DIAGNOSIS — R9401 Abnormal electroencephalogram [EEG]: Secondary | ICD-10-CM | POA: Diagnosis not present

## 2021-09-07 DIAGNOSIS — R479 Unspecified speech disturbances: Secondary | ICD-10-CM | POA: Diagnosis not present

## 2021-09-07 DIAGNOSIS — R569 Unspecified convulsions: Secondary | ICD-10-CM | POA: Diagnosis not present

## 2021-09-07 DIAGNOSIS — R410 Disorientation, unspecified: Secondary | ICD-10-CM | POA: Diagnosis not present

## 2021-09-07 NOTE — Progress Notes (Signed)
EEG Completed; Results Pending  

## 2021-09-08 NOTE — Procedures (Signed)
  History:  86 yof with confusion and speech difficulty   EEG classification: Normal awake and drowsy  Description of the recording: The background rhythms of this recording consists of a fairly well modulated medium amplitude theta rhythm , there was no clear PDR. As the record progresses, the patient appears to remain in the waking state throughout the recording. Photic stimulation was performed, did not show any abnormalities. Hyperventilation was also performed, did not show any abnormalities. Toward the end of the recording, the patient enters the drowsy state with slight symmetric slowing seen. The patient never enters stage II sleep. No abnormal epileptiform discharges seen during this recording. There was mild diffuse generalized slowing. EKG monitor shows no evidence of cardiac rhythm abnormalities with a heart rate of 66 bpm.  Impression: This is an abnormal EEG recording in the waking and drowsy state due to mild generalized slowing. No evidence of interictal epileptiform discharges seen. Diffuse slowing consistent with a mild generalized brain dysfunction such as encephalopathy.    Alric Ran, MD [facility]

## 2021-09-22 ENCOUNTER — Inpatient Hospital Stay (HOSPITAL_COMMUNITY): Payer: Medicare Other

## 2021-09-22 ENCOUNTER — Inpatient Hospital Stay (HOSPITAL_COMMUNITY)
Admission: EM | Admit: 2021-09-22 | Discharge: 2021-10-04 | DRG: 871 | Disposition: A | Discharge status: Skilled Nursing Facility | Payer: Medicare Other | Attending: Family Medicine | Admitting: Family Medicine

## 2021-09-22 ENCOUNTER — Other Ambulatory Visit: Payer: Self-pay

## 2021-09-22 ENCOUNTER — Emergency Department (HOSPITAL_COMMUNITY): Payer: Medicare Other

## 2021-09-22 DIAGNOSIS — I774 Celiac artery compression syndrome: Secondary | ICD-10-CM | POA: Diagnosis not present

## 2021-09-22 DIAGNOSIS — I771 Stricture of artery: Secondary | ICD-10-CM | POA: Diagnosis not present

## 2021-09-22 DIAGNOSIS — Z888 Allergy status to other drugs, medicaments and biological substances status: Secondary | ICD-10-CM

## 2021-09-22 DIAGNOSIS — M79671 Pain in right foot: Secondary | ICD-10-CM

## 2021-09-22 DIAGNOSIS — M25561 Pain in right knee: Secondary | ICD-10-CM | POA: Diagnosis present

## 2021-09-22 DIAGNOSIS — I2510 Atherosclerotic heart disease of native coronary artery without angina pectoris: Secondary | ICD-10-CM | POA: Diagnosis present

## 2021-09-22 DIAGNOSIS — N184 Chronic kidney disease, stage 4 (severe): Secondary | ICD-10-CM | POA: Diagnosis not present

## 2021-09-22 DIAGNOSIS — N179 Acute kidney failure, unspecified: Secondary | ICD-10-CM

## 2021-09-22 DIAGNOSIS — Z7982 Long term (current) use of aspirin: Secondary | ICD-10-CM

## 2021-09-22 DIAGNOSIS — K5730 Diverticulosis of large intestine without perforation or abscess without bleeding: Secondary | ICD-10-CM | POA: Diagnosis present

## 2021-09-22 DIAGNOSIS — Z6841 Body Mass Index (BMI) 40.0 and over, adult: Secondary | ICD-10-CM

## 2021-09-22 DIAGNOSIS — W07XXXA Fall from chair, initial encounter: Secondary | ICD-10-CM | POA: Diagnosis not present

## 2021-09-22 DIAGNOSIS — Z743 Need for continuous supervision: Secondary | ICD-10-CM | POA: Diagnosis not present

## 2021-09-22 DIAGNOSIS — K219 Gastro-esophageal reflux disease without esophagitis: Secondary | ICD-10-CM | POA: Diagnosis present

## 2021-09-22 DIAGNOSIS — K838 Other specified diseases of biliary tract: Secondary | ICD-10-CM | POA: Diagnosis not present

## 2021-09-22 DIAGNOSIS — Z978 Presence of other specified devices: Secondary | ICD-10-CM

## 2021-09-22 DIAGNOSIS — M1610 Unilateral primary osteoarthritis, unspecified hip: Secondary | ICD-10-CM | POA: Diagnosis present

## 2021-09-22 DIAGNOSIS — R748 Abnormal levels of other serum enzymes: Secondary | ICD-10-CM

## 2021-09-22 DIAGNOSIS — E876 Hypokalemia: Secondary | ICD-10-CM | POA: Diagnosis present

## 2021-09-22 DIAGNOSIS — R7401 Elevation of levels of liver transaminase levels: Secondary | ICD-10-CM | POA: Diagnosis not present

## 2021-09-22 DIAGNOSIS — A419 Sepsis, unspecified organism: Secondary | ICD-10-CM | POA: Diagnosis present

## 2021-09-22 DIAGNOSIS — J9601 Acute respiratory failure with hypoxia: Secondary | ICD-10-CM | POA: Diagnosis present

## 2021-09-22 DIAGNOSIS — Z79899 Other long term (current) drug therapy: Secondary | ICD-10-CM

## 2021-09-22 DIAGNOSIS — R7881 Bacteremia: Secondary | ICD-10-CM | POA: Diagnosis not present

## 2021-09-22 DIAGNOSIS — R1013 Epigastric pain: Secondary | ICD-10-CM | POA: Diagnosis not present

## 2021-09-22 DIAGNOSIS — K449 Diaphragmatic hernia without obstruction or gangrene: Secondary | ICD-10-CM | POA: Diagnosis not present

## 2021-09-22 DIAGNOSIS — E871 Hypo-osmolality and hyponatremia: Secondary | ICD-10-CM | POA: Diagnosis present

## 2021-09-22 DIAGNOSIS — M4800 Spinal stenosis, site unspecified: Secondary | ICD-10-CM | POA: Diagnosis present

## 2021-09-22 DIAGNOSIS — R6521 Severe sepsis with septic shock: Secondary | ICD-10-CM | POA: Diagnosis not present

## 2021-09-22 DIAGNOSIS — E559 Vitamin D deficiency, unspecified: Secondary | ICD-10-CM | POA: Diagnosis present

## 2021-09-22 DIAGNOSIS — Z20822 Contact with and (suspected) exposure to covid-19: Secondary | ICD-10-CM | POA: Diagnosis present

## 2021-09-22 DIAGNOSIS — R1084 Generalized abdominal pain: Secondary | ICD-10-CM | POA: Diagnosis present

## 2021-09-22 DIAGNOSIS — J811 Chronic pulmonary edema: Secondary | ICD-10-CM | POA: Diagnosis not present

## 2021-09-22 DIAGNOSIS — Z833 Family history of diabetes mellitus: Secondary | ICD-10-CM

## 2021-09-22 DIAGNOSIS — I517 Cardiomegaly: Secondary | ICD-10-CM | POA: Diagnosis not present

## 2021-09-22 DIAGNOSIS — F419 Anxiety disorder, unspecified: Secondary | ICD-10-CM | POA: Diagnosis present

## 2021-09-22 DIAGNOSIS — M25562 Pain in left knee: Secondary | ICD-10-CM | POA: Diagnosis not present

## 2021-09-22 DIAGNOSIS — E872 Acidosis, unspecified: Secondary | ICD-10-CM | POA: Diagnosis present

## 2021-09-22 DIAGNOSIS — B954 Other streptococcus as the cause of diseases classified elsewhere: Secondary | ICD-10-CM | POA: Diagnosis present

## 2021-09-22 DIAGNOSIS — J9811 Atelectasis: Secondary | ICD-10-CM | POA: Diagnosis not present

## 2021-09-22 DIAGNOSIS — R6889 Other general symptoms and signs: Secondary | ICD-10-CM | POA: Diagnosis not present

## 2021-09-22 DIAGNOSIS — I708 Atherosclerosis of other arteries: Secondary | ICD-10-CM | POA: Diagnosis not present

## 2021-09-22 DIAGNOSIS — R0689 Other abnormalities of breathing: Secondary | ICD-10-CM | POA: Diagnosis not present

## 2021-09-22 DIAGNOSIS — J9690 Respiratory failure, unspecified, unspecified whether with hypoxia or hypercapnia: Secondary | ICD-10-CM | POA: Diagnosis not present

## 2021-09-22 DIAGNOSIS — M79674 Pain in right toe(s): Secondary | ICD-10-CM | POA: Diagnosis not present

## 2021-09-22 DIAGNOSIS — E7800 Pure hypercholesterolemia, unspecified: Secondary | ICD-10-CM | POA: Diagnosis present

## 2021-09-22 DIAGNOSIS — R339 Retention of urine, unspecified: Secondary | ICD-10-CM | POA: Diagnosis not present

## 2021-09-22 DIAGNOSIS — Z8249 Family history of ischemic heart disease and other diseases of the circulatory system: Secondary | ICD-10-CM | POA: Diagnosis not present

## 2021-09-22 DIAGNOSIS — Y92230 Patient room in hospital as the place of occurrence of the external cause: Secondary | ICD-10-CM | POA: Diagnosis not present

## 2021-09-22 DIAGNOSIS — I5032 Chronic diastolic (congestive) heart failure: Secondary | ICD-10-CM | POA: Diagnosis present

## 2021-09-22 DIAGNOSIS — A4151 Sepsis due to Escherichia coli [E. coli]: Secondary | ICD-10-CM | POA: Diagnosis present

## 2021-09-22 DIAGNOSIS — F32A Depression, unspecified: Secondary | ICD-10-CM | POA: Diagnosis present

## 2021-09-22 DIAGNOSIS — R531 Weakness: Secondary | ICD-10-CM | POA: Diagnosis not present

## 2021-09-22 DIAGNOSIS — M109 Gout, unspecified: Secondary | ICD-10-CM | POA: Diagnosis present

## 2021-09-22 DIAGNOSIS — M479 Spondylosis, unspecified: Secondary | ICD-10-CM | POA: Diagnosis present

## 2021-09-22 DIAGNOSIS — I4510 Unspecified right bundle-branch block: Secondary | ICD-10-CM | POA: Diagnosis present

## 2021-09-22 DIAGNOSIS — G8929 Other chronic pain: Secondary | ICD-10-CM | POA: Diagnosis present

## 2021-09-22 DIAGNOSIS — B955 Unspecified streptococcus as the cause of diseases classified elsewhere: Secondary | ICD-10-CM | POA: Diagnosis not present

## 2021-09-22 DIAGNOSIS — R188 Other ascites: Secondary | ICD-10-CM | POA: Diagnosis not present

## 2021-09-22 DIAGNOSIS — Z9049 Acquired absence of other specified parts of digestive tract: Secondary | ICD-10-CM

## 2021-09-22 DIAGNOSIS — Z96642 Presence of left artificial hip joint: Secondary | ICD-10-CM | POA: Diagnosis present

## 2021-09-22 DIAGNOSIS — R7303 Prediabetes: Secondary | ICD-10-CM | POA: Diagnosis present

## 2021-09-22 DIAGNOSIS — K7200 Acute and subacute hepatic failure without coma: Secondary | ICD-10-CM | POA: Diagnosis not present

## 2021-09-22 DIAGNOSIS — E861 Hypovolemia: Secondary | ICD-10-CM | POA: Diagnosis present

## 2021-09-22 DIAGNOSIS — R404 Transient alteration of awareness: Secondary | ICD-10-CM | POA: Diagnosis not present

## 2021-09-22 DIAGNOSIS — I701 Atherosclerosis of renal artery: Secondary | ICD-10-CM | POA: Diagnosis not present

## 2021-09-22 DIAGNOSIS — M419 Scoliosis, unspecified: Secondary | ICD-10-CM | POA: Diagnosis present

## 2021-09-22 DIAGNOSIS — Z4682 Encounter for fitting and adjustment of non-vascular catheter: Secondary | ICD-10-CM | POA: Diagnosis not present

## 2021-09-22 DIAGNOSIS — M7989 Other specified soft tissue disorders: Secondary | ICD-10-CM | POA: Diagnosis not present

## 2021-09-22 DIAGNOSIS — M5441 Lumbago with sciatica, right side: Secondary | ICD-10-CM | POA: Diagnosis present

## 2021-09-22 DIAGNOSIS — R109 Unspecified abdominal pain: Secondary | ICD-10-CM | POA: Diagnosis not present

## 2021-09-22 DIAGNOSIS — J90 Pleural effusion, not elsewhere classified: Secondary | ICD-10-CM | POA: Diagnosis not present

## 2021-09-22 DIAGNOSIS — M5442 Lumbago with sciatica, left side: Secondary | ICD-10-CM | POA: Diagnosis present

## 2021-09-22 DIAGNOSIS — R000 Tachycardia, unspecified: Secondary | ICD-10-CM | POA: Diagnosis not present

## 2021-09-22 DIAGNOSIS — E669 Obesity, unspecified: Secondary | ICD-10-CM | POA: Diagnosis not present

## 2021-09-22 DIAGNOSIS — I130 Hypertensive heart and chronic kidney disease with heart failure and stage 1 through stage 4 chronic kidney disease, or unspecified chronic kidney disease: Secondary | ICD-10-CM | POA: Diagnosis not present

## 2021-09-22 DIAGNOSIS — A498 Other bacterial infections of unspecified site: Secondary | ICD-10-CM | POA: Diagnosis not present

## 2021-09-22 DIAGNOSIS — R945 Abnormal results of liver function studies: Secondary | ICD-10-CM | POA: Diagnosis not present

## 2021-09-22 LAB — CBC WITH DIFFERENTIAL/PLATELET
Abs Immature Granulocytes: 0.65 10*3/uL — ABNORMAL HIGH (ref 0.00–0.07)
Basophils Absolute: 0.1 10*3/uL (ref 0.0–0.1)
Basophils Relative: 0 %
Eosinophils Absolute: 0 10*3/uL (ref 0.0–0.5)
Eosinophils Relative: 0 %
HCT: 29.6 % — ABNORMAL LOW (ref 36.0–46.0)
Hemoglobin: 9.6 g/dL — ABNORMAL LOW (ref 12.0–15.0)
Immature Granulocytes: 2 %
Lymphocytes Relative: 2 %
Lymphs Abs: 0.6 10*3/uL — ABNORMAL LOW (ref 0.7–4.0)
MCH: 29.2 pg (ref 26.0–34.0)
MCHC: 32.4 g/dL (ref 30.0–36.0)
MCV: 90 fL (ref 80.0–100.0)
Monocytes Absolute: 2.7 10*3/uL — ABNORMAL HIGH (ref 0.1–1.0)
Monocytes Relative: 8 %
Neutro Abs: 29.7 10*3/uL — ABNORMAL HIGH (ref 1.7–7.7)
Neutrophils Relative %: 88 %
Platelets: 243 10*3/uL (ref 150–400)
RBC: 3.29 MIL/uL — ABNORMAL LOW (ref 3.87–5.11)
RDW: 13.7 % (ref 11.5–15.5)
WBC: 33.8 10*3/uL — ABNORMAL HIGH (ref 4.0–10.5)
nRBC: 0 % (ref 0.0–0.2)

## 2021-09-22 LAB — BASIC METABOLIC PANEL
Anion gap: 11 (ref 5–15)
BUN: 17 mg/dL (ref 8–23)
CO2: 19 mmol/L — ABNORMAL LOW (ref 22–32)
Calcium: 8.8 mg/dL — ABNORMAL LOW (ref 8.9–10.3)
Chloride: 100 mmol/L (ref 98–111)
Creatinine, Ser: 1.11 mg/dL — ABNORMAL HIGH (ref 0.44–1.00)
GFR, Estimated: 48 mL/min — ABNORMAL LOW (ref >60)
Glucose, Bld: 107 mg/dL — ABNORMAL HIGH (ref 70–99)
Potassium: 4.4 mmol/L (ref 3.5–5.1)
Sodium: 130 mmol/L — ABNORMAL LOW (ref 135–145)

## 2021-09-22 LAB — RESP PANEL BY RT-PCR (FLU A&B, COVID) ARPGX2
Influenza A by PCR: NEGATIVE
Influenza B by PCR: NEGATIVE
SARS Coronavirus 2 by RT PCR: NEGATIVE

## 2021-09-22 LAB — BLOOD GAS, ARTERIAL
Acid-base deficit: 6.1 mmol/L — ABNORMAL HIGH (ref 0.0–2.0)
Bicarbonate: 18 mmol/L — ABNORMAL LOW (ref 20.0–28.0)
FIO2: 90
O2 Saturation: 96.8 %
Patient temperature: 36.8
pCO2 arterial: 30.3 mmHg — ABNORMAL LOW (ref 32.0–48.0)
pH, Arterial: 7.389 (ref 7.350–7.450)
pO2, Arterial: 88.2 mmHg (ref 83.0–108.0)

## 2021-09-22 LAB — PROTIME-INR
INR: 1.3 — ABNORMAL HIGH (ref 0.8–1.2)
Prothrombin Time: 16.4 seconds — ABNORMAL HIGH (ref 11.4–15.2)

## 2021-09-22 LAB — TYPE AND SCREEN
ABO/RH(D): O NEG
Antibody Screen: NEGATIVE

## 2021-09-22 LAB — I-STAT CHEM 8, ED
BUN: 20 mg/dL (ref 8–23)
Calcium, Ion: 1.11 mmol/L — ABNORMAL LOW (ref 1.15–1.40)
Chloride: 97 mmol/L — ABNORMAL LOW (ref 98–111)
Creatinine, Ser: 1.2 mg/dL — ABNORMAL HIGH (ref 0.44–1.00)
Glucose, Bld: 80 mg/dL (ref 70–99)
HCT: 30 % — ABNORMAL LOW (ref 36.0–46.0)
Hemoglobin: 10.2 g/dL — ABNORMAL LOW (ref 12.0–15.0)
Potassium: 3.2 mmol/L — ABNORMAL LOW (ref 3.5–5.1)
Sodium: 131 mmol/L — ABNORMAL LOW (ref 135–145)
TCO2: 19 mmol/L — ABNORMAL LOW (ref 22–32)

## 2021-09-22 LAB — MRSA NEXT GEN BY PCR, NASAL: MRSA by PCR Next Gen: NOT DETECTED

## 2021-09-22 LAB — URINALYSIS, ROUTINE W REFLEX MICROSCOPIC
Bacteria, UA: NONE SEEN
Bilirubin Urine: NEGATIVE
Cellular Cast, UA: 4
Glucose, UA: NEGATIVE mg/dL
Hgb urine dipstick: NEGATIVE
Ketones, ur: NEGATIVE mg/dL
Leukocytes,Ua: NEGATIVE
Nitrite: NEGATIVE
Protein, ur: 30 mg/dL — AB
Specific Gravity, Urine: 1.035 — ABNORMAL HIGH (ref 1.005–1.030)
pH: 6 (ref 5.0–8.0)

## 2021-09-22 LAB — COMPREHENSIVE METABOLIC PANEL
ALT: 499 U/L — ABNORMAL HIGH (ref 0–44)
AST: 468 U/L — ABNORMAL HIGH (ref 15–41)
Albumin: 2.8 g/dL — ABNORMAL LOW (ref 3.5–5.0)
Alkaline Phosphatase: 186 U/L — ABNORMAL HIGH (ref 38–126)
Anion gap: 16 — ABNORMAL HIGH (ref 5–15)
BUN: 20 mg/dL (ref 8–23)
CO2: 17 mmol/L — ABNORMAL LOW (ref 22–32)
Calcium: 8.9 mg/dL (ref 8.9–10.3)
Chloride: 98 mmol/L (ref 98–111)
Creatinine, Ser: 1.49 mg/dL — ABNORMAL HIGH (ref 0.44–1.00)
GFR, Estimated: 34 mL/min — ABNORMAL LOW (ref >60)
Glucose, Bld: 78 mg/dL (ref 70–99)
Potassium: 3.3 mmol/L — ABNORMAL LOW (ref 3.5–5.1)
Sodium: 131 mmol/L — ABNORMAL LOW (ref 135–145)
Total Bilirubin: 2.3 mg/dL — ABNORMAL HIGH (ref 0.3–1.2)
Total Protein: 5.1 g/dL — ABNORMAL LOW (ref 6.5–8.1)

## 2021-09-22 LAB — LIPASE, BLOOD: Lipase: 135 U/L — ABNORMAL HIGH (ref 11–51)

## 2021-09-22 LAB — GLUCOSE, CAPILLARY
Glucose-Capillary: 96 mg/dL (ref 70–99)
Glucose-Capillary: 94 mg/dL (ref 70–99)

## 2021-09-22 LAB — POCT I-STAT 7, (LYTES, BLD GAS, ICA,H+H)
Acid-base deficit: 3 mmol/L — ABNORMAL HIGH (ref 0.0–2.0)
Bicarbonate: 23.7 mmol/L (ref 20.0–28.0)
Calcium, Ion: 1.35 mmol/L (ref 1.15–1.40)
HCT: 32 % — ABNORMAL LOW (ref 36.0–46.0)
Hemoglobin: 10.9 g/dL — ABNORMAL LOW (ref 12.0–15.0)
O2 Saturation: 98 %
Potassium: 3.1 mmol/L — ABNORMAL LOW (ref 3.5–5.1)
Sodium: 133 mmol/L — ABNORMAL LOW (ref 135–145)
TCO2: 25 mmol/L (ref 22–32)
pCO2 arterial: 50.3 mmHg — ABNORMAL HIGH (ref 32.0–48.0)
pH, Arterial: 7.281 — ABNORMAL LOW (ref 7.350–7.450)
pO2, Arterial: 111 mmHg — ABNORMAL HIGH (ref 83.0–108.0)

## 2021-09-22 LAB — LACTIC ACID, PLASMA
Lactic Acid, Venous: 5.1 mmol/L (ref 0.5–1.9)
Lactic Acid, Venous: 4 mmol/L (ref 0.5–1.9)
Lactic Acid, Venous: 4.1 mmol/L (ref 0.5–1.9)
Lactic Acid, Venous: 3.9 mmol/L (ref 0.5–1.9)

## 2021-09-22 LAB — TROPONIN I (HIGH SENSITIVITY)
Troponin I (High Sensitivity): 24 ng/L — ABNORMAL HIGH (ref <18)
Troponin I (High Sensitivity): 24 ng/L — ABNORMAL HIGH (ref <18)

## 2021-09-22 LAB — POC OCCULT BLOOD, ED: Fecal Occult Bld: NEGATIVE

## 2021-09-22 LAB — VANCOMYCIN, RANDOM: Vancomycin Rm: <4

## 2021-09-22 MED ORDER — NOREPINEPHRINE 4 MG/250ML-% IV SOLN
0.0000 ug/min | INTRAVENOUS | Status: DC
  Administered 2021-09-22: 10 ug/min via INTRAVENOUS
  Administered 2021-09-22: 11 ug/min via INTRAVENOUS
  Administered 2021-09-22: 28 ug/min via INTRAVENOUS
  Filled 2021-09-22 (×3): qty 250

## 2021-09-22 MED ORDER — FENTANYL CITRATE (PF) 100 MCG/2ML IJ SOLN
25.0000 ug | INTRAMUSCULAR | Status: DC | PRN
  Administered 2021-09-22 (×2): 100 ug via INTRAVENOUS
  Administered 2021-09-22: 50 ug via INTRAVENOUS
  Administered 2021-09-22 – 2021-09-25 (×16): 100 ug via INTRAVENOUS
  Filled 2021-09-22 (×20): qty 2

## 2021-09-22 MED ORDER — VANCOMYCIN HCL 750 MG/150ML IV SOLN: 750.0000 mg | INTRAVENOUS | Status: DC

## 2021-09-22 MED ORDER — SODIUM CHLORIDE 0.9 % IV SOLN: Freq: Once | INTRAVENOUS | Status: AC

## 2021-09-22 MED ORDER — VASOPRESSIN 20 UNITS/100 ML INFUSION FOR SHOCK
0.0000 [IU]/min | INTRAVENOUS | Status: DC
  Administered 2021-09-22: 0.03 [IU]/min via INTRAVENOUS
  Administered 2021-09-23: 0.02 [IU]/min via INTRAVENOUS
  Filled 2021-09-22: qty 100

## 2021-09-22 MED ORDER — SODIUM BICARBONATE 8.4 % IV SOLN
INTRAVENOUS | Status: AC
  Administered 2021-09-22: 100 meq via INTRAVENOUS
  Filled 2021-09-22: qty 100

## 2021-09-22 MED ORDER — METRONIDAZOLE 500 MG/100ML IV SOLN
500.0000 mg | Freq: Two times a day (BID) | INTRAVENOUS | Status: DC
  Administered 2021-09-22 – 2021-09-27 (×11): 500 mg via INTRAVENOUS
  Filled 2021-09-22 (×11): qty 100

## 2021-09-22 MED ORDER — SODIUM CHLORIDE 0.9 % IV SOLN
2.0000 g | Freq: Once | INTRAVENOUS | Status: AC
  Administered 2021-09-22: 2 g via INTRAVENOUS
  Filled 2021-09-22: qty 2

## 2021-09-22 MED ORDER — SODIUM CHLORIDE 0.9 % IV BOLUS: 1000.0000 mL | Freq: Once | INTRAVENOUS | Status: DC

## 2021-09-22 MED ORDER — ROCURONIUM BROMIDE 50 MG/5ML IV SOLN
100.0000 mg | Freq: Once | INTRAVENOUS | Status: AC
  Filled 2021-09-22: qty 10

## 2021-09-22 MED ORDER — FENTANYL CITRATE (PF) 100 MCG/2ML IJ SOLN: 25.0000 ug | INTRAMUSCULAR | Status: DC | PRN

## 2021-09-22 MED ORDER — CALCIUM CHLORIDE 10 % IV SOLN: 1.0000 g | Freq: Once | INTRAVENOUS | Status: AC

## 2021-09-22 MED ORDER — ASPIRIN 81 MG PO CHEW
81.0000 mg | CHEWABLE_TABLET | Freq: Every day | ORAL | Status: DC
  Administered 2021-09-23: 81 mg via ORAL
  Filled 2021-09-22: qty 1

## 2021-09-22 MED ORDER — POLYETHYLENE GLYCOL 3350 17 G PO PACK
17.0000 g | PACK | Freq: Every day | ORAL | Status: DC
  Administered 2021-09-23 – 2021-09-25 (×3): 17 g
  Filled 2021-09-22 (×3): qty 1

## 2021-09-22 MED ORDER — METRONIDAZOLE 500 MG/100ML IV SOLN: 500.0000 mg | Freq: Once | INTRAVENOUS | Status: DC

## 2021-09-22 MED ORDER — IOHEXOL 350 MG/ML SOLN
70.0000 mL | Freq: Once | INTRAVENOUS | Status: AC | PRN
  Administered 2021-09-22: 70 mL via INTRAVENOUS

## 2021-09-22 MED ORDER — ROCURONIUM BROMIDE 10 MG/ML (PF) SYRINGE
PREFILLED_SYRINGE | INTRAVENOUS | Status: AC
  Administered 2021-09-22: 100 mg
  Filled 2021-09-22: qty 10

## 2021-09-22 MED ORDER — POLYETHYLENE GLYCOL 3350 17 G PO PACK: 17.0000 g | PACK | Freq: Every day | ORAL | Status: DC | PRN

## 2021-09-22 MED ORDER — DEXMEDETOMIDINE HCL IN NACL 400 MCG/100ML IV SOLN
0.0000 ug/kg/h | INTRAVENOUS | Status: DC
  Administered 2021-09-22: 1.2 ug/kg/h via INTRAVENOUS
  Administered 2021-09-22: 0.4 ug/kg/h via INTRAVENOUS
  Administered 2021-09-22 (×2): 0.8 ug/kg/h via INTRAVENOUS
  Administered 2021-09-23: 1 ug/kg/h via INTRAVENOUS
  Administered 2021-09-23: 1.2 ug/kg/h via INTRAVENOUS
  Filled 2021-09-22 (×5): qty 100

## 2021-09-22 MED ORDER — ETOMIDATE 2 MG/ML IV SOLN: 20.0000 mg | Freq: Once | INTRAVENOUS | Status: AC

## 2021-09-22 MED ORDER — HYDROCORTISONE SOD SUC (PF) 100 MG IJ SOLR
100.0000 mg | Freq: Two times a day (BID) | INTRAMUSCULAR | Status: DC
  Administered 2021-09-22 – 2021-09-23 (×2): 100 mg via INTRAVENOUS
  Filled 2021-09-22 (×2): qty 2

## 2021-09-22 MED ORDER — NOREPINEPHRINE 16 MG/250ML-% IV SOLN
0.0000 ug/min | INTRAVENOUS | Status: DC
  Administered 2021-09-22: 30 ug/min via INTRAVENOUS
  Filled 2021-09-22: qty 250

## 2021-09-22 MED ORDER — VANCOMYCIN HCL 1750 MG/350ML IV SOLN
1750.0000 mg | Freq: Once | INTRAVENOUS | Status: DC
  Filled 2021-09-22: qty 350

## 2021-09-22 MED ORDER — VANCOMYCIN HCL 1750 MG/350ML IV SOLN
1750.0000 mg | Freq: Once | INTRAVENOUS | Status: AC
  Administered 2021-09-22: 1750 mg via INTRAVENOUS
  Filled 2021-09-22: qty 350

## 2021-09-22 MED ORDER — SODIUM CHLORIDE 0.9 % IV BOLUS
1000.0000 mL | Freq: Once | INTRAVENOUS | Status: AC
  Administered 2021-09-22: 1000 mL via INTRAVENOUS

## 2021-09-22 MED ORDER — SODIUM CHLORIDE 0.9 % IV SOLN: 2.0000 g | INTRAVENOUS | Status: DC

## 2021-09-22 MED ORDER — ETOMIDATE 2 MG/ML IV SOLN
INTRAVENOUS | Status: AC
  Administered 2021-09-22: 20 mg
  Filled 2021-09-22: qty 10

## 2021-09-22 MED ORDER — SODIUM BICARBONATE 8.4 % IV SOLN: 100.0000 meq | Freq: Once | INTRAVENOUS | Status: AC

## 2021-09-22 MED ORDER — POTASSIUM CHLORIDE 10 MEQ/100ML IV SOLN
10.0000 meq | INTRAVENOUS | Status: AC
Start: 2021-09-22 — End: 2021-09-22
  Administered 2021-09-22 (×4): 10 meq via INTRAVENOUS
  Filled 2021-09-22 (×2): qty 100

## 2021-09-22 MED ORDER — DOCUSATE SODIUM 100 MG PO CAPS: 100.0000 mg | ORAL_CAPSULE | Freq: Two times a day (BID) | ORAL | Status: DC | PRN

## 2021-09-22 MED ORDER — CALCIUM CHLORIDE 10 % IV SOLN
INTRAVENOUS | Status: AC
  Administered 2021-09-22: 1 g via INTRAVENOUS
  Filled 2021-09-22: qty 10

## 2021-09-22 MED ORDER — HEPARIN SODIUM (PORCINE) 5000 UNIT/ML IJ SOLN
5000.0000 [IU] | Freq: Three times a day (TID) | INTRAMUSCULAR | Status: DC
  Administered 2021-09-22 – 2021-09-29 (×21): 5000 [IU] via SUBCUTANEOUS
  Filled 2021-09-22 (×20): qty 1

## 2021-09-22 NOTE — ED Provider Notes (Signed)
[facility] EMERGENCY DEPARTMENT Provider Note   CSN: [id number] Arrival date & time: 09/22/21  8502     History No chief complaint on file.   Deborah Jordan is a 87 y.o. female.  She is brought in by EMS from home for evaluation of abdominal pain and back pain.  Symptoms started 3 to 4 days ago generalized abdominal pain intermittent but was well in between.  Worsened since last night.  Radiates into back.  Severe in nature.  EMS had blood pressures in the 80s and gave 500 cc of fluid.  Patient is awake, appears uncomfortable and is tachypneic.  Requiring oxygen.  History of cholecystectomy.  Not on blood thinners  The history is provided by the patient, the EMS personnel and a relative.  Abdominal Pain Pain location:  Generalized Pain radiates to:  Back Pain severity:  Severe Onset quality:  Gradual Duration:  4 days Timing:  Intermittent Progression:  Worsening Chronicity:  New Context: not trauma   Relieved by:  None tried Worsened by:  Nothing Ineffective treatments:  None tried Associated symptoms: shortness of breath   Associated symptoms: no chest pain, no cough, no fever, no nausea and no vomiting   Risk factors: being elderly and obesity       Past Medical History:  Diagnosis Date   Anxiety    Arthritis    osteoarthritis. spinal stenosis. Scoliosis of spine-degenerative spine.   Bilateral cataracts    CAD (coronary artery disease)    minimal, improved on right 06/2016   Chronic kidney disease    STAGE 4   DDD (degenerative disc disease), lumbar    Dyspnea    Elevated cholesterol    GERD (gastroesophageal reflux disease)    controls with Nexium   Grade I diastolic dysfunction 77/41/2878   Noted on ECHO   History of cardiomegaly    History of gallstones    Hypertension    LVH (left ventricular hypertrophy) 05/04/2017   Mil, noted on ECHO   Mild depression (HCC)    Pre-diabetes    RBBB (right bundle branch block)    Spinal stenosis     Tubular adenoma    and benign polyps   Vitamin D deficiency    Wears partial dentures     Patient Active Problem List   Diagnosis Date Noted   Dyspnea and respiratory abnormalities    Chronic bilateral low back pain with bilateral sciatica 09/27/2017   Bilateral chronic knee pain 03/13/2017   Pleural effusion 03/10/2013   Hyponatremia 03/10/2013   Hypertension 03/10/2013   Degenerative arthritis of hip 03/08/2013    Past Surgical History:  Procedure Laterality Date   CATARACT EXTRACTION Bilateral 03/06/2013   CHOLECYSTECTOMY     COLONOSCOPY     CRYOTHERAPY     DILATION AND CURETTAGE OF UTERUS     esi     HEMORRHOID SURGERY N/A 09/21/2018   Procedure: SINGLE COLUMN HEMORRHOIDECTOMY, HEMORRHOIDPEXY;  Surgeon: Leighton Ruff, MD;  Location: [place];  Service: General;  Laterality: N/A;   TOTAL HIP ARTHROPLASTY Left 03/08/2013   Procedure: LEFT TOTAL HIP ARTHROPLASTY ANTERIOR APPROACH;  Surgeon: Mcarthur Rossetti, MD;  Location: [facility];  Service: Orthopedics;  Laterality: Left;     OB History   No obstetric history on file.     Family History  Problem Relation Age of Onset   Heart disease Father    Heart failure Father    Diabetes Father    Atrial fibrillation  Sister     Social History   Tobacco Use   Smoking status: Never   Smokeless tobacco: Never  Vaping Use   Vaping Use: Never used  Substance Use Topics   Alcohol use: Yes    Comment: OCC   Drug use: Never    Home Medications Prior to Admission medications   Medication Sig Start Date End Date Taking? Authorizing Provider  acetaminophen (TYLENOL) 650 MG CR tablet Take 650 mg by mouth every 8 (eight) hours as needed for pain.    [provider]  amLODipine (NORVASC) 10 MG tablet Take 10 mg by mouth daily.    [provider]  aspirin EC 81 MG tablet Take 81 mg by mouth daily. Swallow whole.    [provider]  carvedilol (COREG) 12.5 MG tablet Take 12.5 mg  by mouth 2 (two) times daily with a meal.    [provider]  Cholecalciferol (VITAMIN D) 2000 units CAPS Take 2,000 Units by mouth daily.    [provider]  DULoxetine (CYMBALTA) 20 MG capsule Take 1 capsule (20 mg total) by mouth daily. 06/03/21   Jessy Oto, MD  esomeprazole (NEXIUM) 40 MG capsule Take 40 mg by mouth daily.    [provider]  furosemide (LASIX) 40 MG tablet Take 40 mg by mouth daily. Patient not taking: Reported on 06/28/2021 06/26/21   [provider]  gabapentin (NEURONTIN) 100 MG capsule Take 1 capsule (100 mg total) by mouth at bedtime. 06/03/21   Jessy Oto, MD  loratadine (CLARITIN) 10 MG tablet Take 10 mg by mouth daily.    [provider]  sodium chloride 1 g tablet Take 1 tablet (1 g total) by mouth 3 (three) times daily with meals. 06/22/21   Little Ishikawa, MD  traMADol-acetaminophen (ULTRACET) 37.5-325 MG tablet Take 1 tablet by mouth every 6 (six) hours as needed. 07/15/21   Jessy Oto, MD    Allergies    Ace inhibitors and Hydrochlorothiazide  Review of Systems   Review of Systems  Unable to perform ROS: Acuity of condition  Constitutional:  Negative for fever.  Respiratory:  Positive for shortness of breath. Negative for cough.   Cardiovascular:  Negative for chest pain.  Gastrointestinal:  Positive for abdominal pain. Negative for nausea and vomiting.   Physical Exam Updated Vital Signs BP (!) 91/52   Pulse 65   Temp (!) 97.4 F (36.3 C) (Oral)   Resp (!) 25   Ht 4\' 9"  (1.448 m)   Wt 77.1 kg   SpO2 100%   BMI 36.79 kg/m   Physical Exam Vitals and nursing note reviewed.  Constitutional:      General: She is in acute distress.     Appearance: Normal appearance. She is well-developed. She is obese.  HENT:     Head: Normocephalic and atraumatic.  Eyes:     Conjunctiva/sclera: Conjunctivae normal.  Cardiovascular:     Rate and Rhythm: Normal rate and regular rhythm.     Heart sounds:  No murmur heard. Pulmonary:     Effort: Pulmonary effort is normal. Tachypnea present. No respiratory distress.     Breath sounds: Normal breath sounds.  Abdominal:     Palpations: Abdomen is soft. There is no mass.     Tenderness: There is abdominal tenderness. There is no guarding or rebound.  Musculoskeletal:        General: No deformity or signs of injury. Normal range of motion.  Cervical back: Neck supple.  Skin:    General: Skin is warm and dry.  Neurological:     General: No focal deficit present.     Mental Status: She is alert.    ED Results / Procedures / Treatments   Labs (all labs ordered are listed, but only abnormal results are displayed) Labs Reviewed  COMPREHENSIVE METABOLIC PANEL - Abnormal; Notable for the following components:      Result Value   Sodium 131 (*)    Potassium 3.3 (*)    CO2 17 (*)    Creatinine, Ser 1.49 (*)    Total Protein 5.1 (*)    Albumin 2.8 (*)    AST 468 (*)    ALT 499 (*)    Alkaline Phosphatase 186 (*)    Total Bilirubin 2.3 (*)    GFR, Estimated 34 (*)    Anion gap 16 (*)    All other components within normal limits  LACTIC ACID, PLASMA - Abnormal; Notable for the following components:   Lactic Acid, Venous 5.1 (*)    All other components within normal limits  LACTIC ACID, PLASMA - Abnormal; Notable for the following components:   Lactic Acid, Venous 4.0 (*)    All other components within normal limits  LIPASE, BLOOD - Abnormal; Notable for the following components:   Lipase 135 (*)    All other components within normal limits  CBC WITH DIFFERENTIAL/PLATELET - Abnormal; Notable for the following components:   WBC 33.8 (*)    RBC 3.29 (*)    Hemoglobin 9.6 (*)    HCT 29.6 (*)    Neutro Abs 29.7 (*)    Lymphs Abs 0.6 (*)    Monocytes Absolute 2.7 (*)    Abs Immature Granulocytes 0.65 (*)    All other components within normal limits  URINALYSIS, ROUTINE W REFLEX MICROSCOPIC - Abnormal; Notable for the following  components:   Color, Urine AMBER (*)    Specific Gravity, Urine 1.035 (*)    Protein, ur 30 (*)    Non Squamous Epithelial 0-5 (*)    All other components within normal limits  PROTIME-INR - Abnormal; Notable for the following components:   Prothrombin Time 16.4 (*)    INR 1.3 (*)    All other components within normal limits  I-STAT CHEM 8, ED - Abnormal; Notable for the following components:   Sodium 131 (*)    Potassium 3.2 (*)    Chloride 97 (*)    Creatinine, Ser 1.20 (*)    Calcium, Ion 1.11 (*)    TCO2 19 (*)    Hemoglobin 10.2 (*)    HCT 30.0 (*)    All other components within normal limits  POCT I-STAT 7, (LYTES, BLD GAS, ICA,H+H) - Abnormal; Notable for the following components:   pH, Arterial 7.281 (*)    pCO2 arterial 50.3 (*)    pO2, Arterial 111 (*)    Acid-base deficit 3.0 (*)    Sodium 133 (*)    Potassium 3.1 (*)    HCT 32.0 (*)    Hemoglobin 10.9 (*)    All other components within normal limits  TROPONIN I (HIGH SENSITIVITY) - Abnormal; Notable for the following components:   Troponin I (High Sensitivity) 24 (*)    All other components within normal limits  TROPONIN I (HIGH SENSITIVITY) - Abnormal; Notable for the following components:   Troponin I (High Sensitivity) 24 (*)    All other components within normal limits  RESP PANEL BY RT-PCR (FLU A&B, COVID) ARPGX2  MRSA NEXT GEN BY PCR, NASAL  CULTURE, BLOOD (ROUTINE X 2)  CULTURE, BLOOD (ROUTINE X 2)  GLUCOSE, CAPILLARY  MISCELLANEOUS GENETIC TEST  BLOOD GAS, ARTERIAL  BASIC METABOLIC PANEL  LACTIC ACID, PLASMA  LACTIC ACID, PLASMA  VANCOMYCIN, RANDOM  CBC  BASIC METABOLIC PANEL  MAGNESIUM  PHOSPHORUS  HEPATIC FUNCTION PANEL  POC OCCULT BLOOD, ED  POC OCCULT BLOOD, ED  TYPE AND SCREEN    EKG EKG Interpretation  Date/Time:  Wednesday September 22 2021 08:14:48 EDT Ventricular Rate:  73 PR Interval:  187 QRS Duration: 150 QT Interval:  426 QTC Calculation: 470 R Axis:   -66 Text  Interpretation: Sinus rhythm RBBB and LAFB LVH with secondary repolarization abnormality No significant change since prior 5/22 Confirmed by Aletta Edouard [phone]) on 09/22/2021 8:19:11 AM  Radiology DG Chest 1 View  Result Date: 09/22/2021 CLINICAL DATA:  Endotracheal tube placement EXAM: CHEST  1 VIEW COMPARISON:  Radiograph 09/22/2021 FINDINGS: Endotracheal tube extends into the proximal RIGHT mainstem bronchus. Recommend retraction by approximately 4 cm to 5 cm. There is new atelectasis versus infiltrate in the RIGHT lower lobe. Mild pulmonary edema interstitial edema pattern. RIGHT central venous line with tip in the distal RIGHT subclavian vein. IMPRESSION: 1. Endotracheal tube is low.  Recommend retraction by 4-5 cm. 2. Central venous line with tip in the distal RIGHT subclavian vein 3. No pneumothorax. 4. Increased RIGHT basilar atelectasis. These results will be called to the ordering clinician or representative by the Radiologist Assistant, and communication documented in the PACS or [employer]. Electronically Signed   By: Suzy Bouchard M.D.   On: 09/22/2021 13:59   DG CHEST PORT 1 VIEW  Result Date: 09/22/2021 CLINICAL DATA:  Intubation EXAM: PORTABLE CHEST 1 VIEW COMPARISON:  Portable exam 1409 hours compared to 1321 hours FINDINGS: Tip of endotracheal tube is within origin of the RIGHT mainstem bronchus; recommend withdrawal 3 cm. Tip of RIGHT subclavian central venous catheter projects over distal RIGHT jugular vein, stable. Enlargement of cardiac silhouette with pulmonary vascular congestion. Scattered interstitial infiltrates and small bibasilar effusions. No pneumothorax. IMPRESSION: Recommend withdrawal of endotracheal tube 3 cm to place tip in trachea above carina. Persistent pulmonary edema. Tip of RIGHT subclavian line projects over distal RIGHT jugular vein. Findings called to [facility] on 09/21/2021 at 1435 hrs. Electronically Signed   By: Lavonia Dana M.D.   On:  09/22/2021 14:37   DG Chest Port 1 View  Result Date: 09/22/2021 CLINICAL DATA:  Pt brought in via EMS with c/c of epigastric abd pain radiating to back. Abd pain started 4 days ago but was worsen last night and constant. Family called EMS due to constant pain and radiating to shoulderPt having difficulty breathing with oxygen sats in the 80s EXAM: PORTABLE CHEST 1 VIEW COMPARISON:  06/19/2021 and older exams. FINDINGS: Cardiac silhouette is mildly enlarged. No mediastinal or hilar masses. Stable chronic prominence of the interstitial markings. Lungs otherwise clear. No convincing pleural effusion and no pneumothorax. Skeletal structures are demineralized, grossly intact. IMPRESSION: No acute cardiopulmonary disease. Electronically Signed   By: Lajean Manes M.D.   On: 09/22/2021 08:48   CT Angio Chest/Abd/Pel for Dissection W and/or W/WO  Result Date: 09/22/2021 CLINICAL DATA:  Abdominal pain.  Concern for aortic dissection. EXAM: CT ANGIOGRAPHY CHEST, ABDOMEN AND PELVIS TECHNIQUE: Non-contrast CT of the chest was initially obtained. Multidetector CT imaging through the chest, abdomen and pelvis was performed  using the standard protocol during bolus administration of intravenous contrast. Multiplanar reconstructed images and MIPs were obtained and reviewed to evaluate the vascular anatomy. CONTRAST:  35mL OMNIPAQUE IOHEXOL 350 MG/ML SOLN COMPARISON:  None. FINDINGS: CTA CHEST FINDINGS Vascular Findings: Review of the precontrast images is negative for the presence of an intramural hematoma. Moderate amount of atherosclerotic plaque within a normal caliber thoracic aorta, not resulting in hemodynamically significant stenosis. No evidence of thoracic aortic dissection or periaortic stranding on this non gated examination. Borderline cardiomegaly. Coronary artery calcifications. No pericardial effusion though there is a trace amount of fluid seen with the pericardial recess. Although this examination was not  tailored for the evaluation the pulmonary arteries, there are no discrete filling defects within the central pulmonary arterial tree to suggest central pulmonary embolism. Small amount of air is seen within the root of the main pulmonary artery, likely the sequela of peripheral IV insertion. ------------------------------------------------------------- Thoracic aortic measurements: SINOTUBULAR JUNCTION: 27 mm as measured in greatest oblique short axis coronal dimension. PROXIMAL ASCENDING THORACIC AORTA: 30 mm as measured in greatest oblique short axis axial dimension at the level of the main pulmonary artery. AORTIC ARCH: 26 mm as measured in greatest oblique short axis sagittal dimension. PROXIMAL DESCENDING THORACIC AORTA: 24 mm as measured in greatest oblique short axis axial dimension at the level of the main pulmonary artery. DISTAL DESCENDING THORACIC AORTA: 24 mm as measured in greatest oblique short axis axial dimension at the level of the diaphragmatic hiatus. Review of the MIP images confirms the above findings. ------------------------------------------------------------- Non-Vascular Findings: Mediastinum/Lymph Nodes: No bulky mediastinal, hilar or axillary lymphadenopathy. Lungs/Pleura: Minimal dependent subpleural ground-glass and consolidative opacities favored to represent atelectasis. No discrete air bronchograms though there are nodular areas of persistent aeration within the consolidative opacities within the bilateral lung bases. No pleural effusion or pneumothorax. The central pulmonary airways appear patent. No discrete pulmonary nodules given respiratory artifact. Musculoskeletal: No acute or aggressive osseous abnormalities. Moderate to severe degenerative change of the bilateral glenohumeral joints, right greater than left, incompletely evaluated. Stigmata of dish throughout the thoracic spine. Moderate DDD within the imaged lower cervical spine. Regional soft tissues appear normal. Normal  appearance of the thyroid gland. _________________________________________________________ _________________________________________________________ CTA ABDOMEN AND PELVIS FINDINGS VASCULAR Aorta: There is a moderate to large amount of predominantly calcified atherosclerotic plaque throughout the abdominal aorta, particularly within the superior most aspect of the abdominal aorta at the level of the diaphragmatic hiatus (image 91, series 100), though not resulting in hemodynamically significant stenosis. No evidence of abdominal aortic dissection or periaortic stranding. Celiac: There is a large amount of calcified atherosclerotic plaque involving the origin of the celiac artery with subtotal occlusion involving the proximal 1 point 3 cm of the vessel (axial image 93, series 100; sagittal image 114, series 13). There is collateral supply from the SMA via the GDA and a hypertrophied network of pancreaticoduodenal arteries. SMA: Minimal amount of eccentric predominantly calcified atherosclerotic plaque involving the origin and proximal aspect of the SMA, not resulting in hemodynamically significant stenosis. As above, the SMA provides robust collateral supply to the celiac artery via the GDA and hypertrophied network of pancreaticoduodenal arteries Renals: The right renal artery is duplicated, nearly co-dominant. Solitary left renal artery. There is a large amount of eccentric mixed calcified and noncalcified atherosclerotic plaque involving the origin of the left renal artery resulting in short segment at least 50% luminal narrowing (coronal image 103, series 12). The right renal arteries appear patent without a hemodynamically significant  narrowing. No vessel irregularity to suggest FMD. IMA: Diseased at its origin though remains patent, also with collateral supply from the SMA. Inflow: There is a minimal amount of eccentric mixed calcified and noncalcified atherosclerotic plaque involving the bilateral common iliac  arteries, not resulting in a hemodynamically significant stenosis. The bilateral internal iliac arteries are diseased though patent of normal caliber. The bilateral external iliac arteries are of normal caliber and widely patent without a hemodynamically significant narrowing. There is a very minimal amount of atherosclerotic plaque involving the bilateral common and imaged portions of the bilateral deep and superficial femoral arteries, not resulting in a hemodynamically significant stenosis. Veins: The IVC and pelvic venous systems appear patent on this arterial phase examination Review of the MIP images confirms the above findings. _________________________________________________________ NON-VASCULAR Hepatobiliary: Normal hepatic contour. No discrete hyperenhancing hepatic lesions. Post cholecystectomy. Common bile duct is mildly dilated measuring 1.2 cm in diameter, likely secondary to patient's age and post cholecystectomy state. No intrahepatic biliary dilatation. No ascites. Pancreas: Normal early arterial phase appearance of the pancreas. Spleen: The spleen is somewhat atrophic but otherwise normal appearance. Adrenals/Urinary Tract: There is symmetric enhancement of the bilateral kidneys. No evidence of nephrolithiasis. No urinary obstruction or perinephric stranding. Normal appearance of the bilateral adrenal glands. Normal appearance of the urinary bladder given degree of distention. Stomach/Bowel: Scattered colonic diverticulosis without evidence of superimposed acute diverticulitis. Moderate colonic stool burden without evidence of enteric obstruction. Normal appearance of the terminal ileum and the retrocecal appendix. High density ingested radiopaque debris is seen within the gastric fundus. Small hiatal hernia. No discrete areas of bowel wall thickening. Note is made of a small diverticulum involving the horizontal segment of the duodenum. Lymphatic: No bulky retroperitoneal, porta hepatis,  mesenteric, pelvic or inguinal lymphadenopathy. Reproductive: Normal appearance of the pelvic organs for age. No discrete adnexal lesion. No free fluid the pelvic cul-de-sac. Other: Subcutaneous edema about the midline of the low back. Small bilateral mesenteric fat containing inguinal hernias. Musculoskeletal: No acute or aggressive osseous abnormalities. Moderate severe multilevel lumbar spine DDD. Mild presumably degenerative scoliotic curvature of the thoracolumbar spine. Post left total hip replacement without evidence of hardware failure or loosening within the imaged components. Mild-to-moderate degenerative change of the right hip with joint space loss, subchondral sclerosis and osteophytosis. Review of the MIP images confirms the above findings. IMPRESSION: Chest CTA impression: 1. No acute cardiopulmonary disease. Specifically, no evidence of thoracic aortic aneurysm, dissection or central pulmonary embolism. 2. Coronary artery calcifications. Abdomen and pelvic CTA impression: 1. No acute findings within the abdomen or pelvis. 2. Moderate to large amount of atherosclerotic plaque within a normal caliber abdominal aorta, not resulting in a hemodynamically significant stenosis. Aortic Atherosclerosis (ICD10-I70.0). 3. 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 The IMA is diseased at its origin though remains patent, though also with collateral supply from the SMA. Presently, there is no evidence of acute mesenteric ischemia, specifically, no discrete areas of bowel wall thickening or evidence of pneumatosis or portal venous gas, however constellation of above findings could be seen in the setting of chronic mesenteric ischemia. Clinical correlation is advised. 4. Suspected hemodynamically significant narrowing involving the origin of the left renal artery without associated delayed renal enhancement or asymmetric renal atrophy. 5.  Colonic diverticulosis without evidence superimposed acute diverticulitis. 6. Post cholecystectomy. Electronically Signed   By: Sandi Mariscal M.D.   On: 09/22/2021 09:43    Procedures .Critical  Care Performed by: Hayden Rasmussen, MD Authorized by: Hayden Rasmussen, MD   Critical care provider statement:    Critical care time (minutes):  100   Critical care time was exclusive of:  Separately billable procedures and treating other patients   Critical care was necessary to treat or prevent imminent or life-threatening deterioration of the following conditions:  Circulatory failure, sepsis and shock   Critical care was time spent personally by me on the following activities:  Discussions with consultants, evaluation of patient's response to treatment, examination of patient, ordering and performing treatments and interventions, ordering and review of laboratory studies, ordering and review of radiographic studies, pulse oximetry, re-evaluation of patient's condition, obtaining history from patient or surrogate, review of old charts and development of treatment plan with patient or surrogate   Medications Ordered in ED Medications  norepinephrine (LEVOPHED) 4mg  in 278mL premix infusion (20 mcg/min Intravenous Rate/Dose Change 09/22/21 0906)  0.9 %  sodium chloride infusion (has no administration in time range)  heparin injection 5,000 Units (has no administration in time range)  docusate sodium (COLACE) capsule 100 mg (has no administration in time range)  polyethylene glycol (MIRALAX / GLYCOLAX) packet 17 g (has no administration in time range)  sodium chloride 0.9 % bolus 1,000 mL (0 mLs Intravenous Stopped 09/22/21 0825)  ceFEPIme (MAXIPIME) 2 g in sodium chloride 0.9 % 100 mL IVPB (2 g Intravenous New Bag/Given 09/22/21 0906)  iohexol (OMNIPAQUE) 350 MG/ML injection 70 mL (70 mLs Intravenous Contrast Given 09/22/21 0848)    ED Course  I have reviewed the triage vital signs and the nursing  notes.  Pertinent labs & imaging results that were available during my care of the patient were reviewed by me and considered in my medical decision making (see chart for details).  Clinical Course as of 09/22/21 1707  Wed Sep 22, 2021  0825 Pcxr interpreted by me, no infiltrates.  [MB]  0826 2 liters sallinew blous in, bpo 70s [MB]  0826 leVophed ordered [MB]  9563 Discussed with critical care PA Shearon Stalls who will evaluate the patient. [MB]  [id number] Patient continues to have increased work of breathing.  Pressure up over 100 now.  Still tachypneic and requiring oxygen.  Discussed with daughter CODE STATUS and at this point she thinks the patient would be full code. [MB]  [phone] CT angio chest abdomen and pelvis does not show any acute findings that would account for patient's symptoms. [MB]  [id number] Critical care is down here seeing patient now.  They are admitting to their service.  They do not feel GI needs to be involved with the case right now feel LFT abnormalities are more consistent with shock liver.  Giving more fluid. [MB]    Clinical Course User Index [MB] Hayden Rasmussen, MD   MDM Rules/Calculators/A&P                          DARLINA MCCAUGHEY was evaluated in Emergency Department on 09/22/2021 for the symptoms described in the history of present illness. She was evaluated in the context of the global COVID-19 pandemic, which necessitated consideration that the patient might be at risk for infection with the SARS-CoV-2 virus that causes COVID-19. Institutional protocols and algorithms that pertain to the evaluation of patients at risk for COVID-19 are in a state of rapid change based on information released by regulatory bodies including the CDC and federal and state organizations. These policies and algorithms  were followed during the patient's care in the ED.  This patient complains of abdominal pain back pain chest pain; this involves an extensive number of treatment Options and is a  complaint that carries with it a high risk of complications and Morbidity. The differential includes dissection, PE, perforation, obstruction, gastritis, peptic ulcer disease, ACS, diverticulitis, colitis  I ordered, reviewed and interpreted labs, which included CBC with markedly elevated white count, hemoglobin slightly lower than baseline, chemistries low potassium elevated creatinine, urinalysis without clear signs of infection gusset elevated, LFTs elevated and obstructive pattern, troponin mildly elevated but needs to be trended I ordered medication IV fluids IV antibiotics I ordered imaging studies which included chest x-ray and CT angio chest abdomen and pelvis and I independently    visualized and interpreted imaging which showed no acute findings.  Some mild biliary dilatation likely related to prior cholecystectomy. Additional history obtained from EMS and patient's daughter Previous records obtained and reviewed in epic no recent admissions I consulted critical care Dr. Andree Coss and discussed lab and imaging findings  Critical Interventions: Aggressive intervention with IV fluids and antibiotics for patient's hypotension.  Initiation of Levophed.  After the interventions stated above, I reevaluated the patient and found patient to be hemodynamically better although more somnolent.  She will be admitted to the intensive care team.  Anticipate may need central line and intubation soon.  Discussions with daughter patient is full code at present   Final Clinical Impression(s) / ED Diagnoses Final diagnoses:  Septic shock [facility])    Rx / DC Orders ED Discharge Orders     None        Hayden Rasmussen, MD 09/22/21 1713

## 2021-09-22 NOTE — Sepsis Progress Note (Signed)
Secure chat with ED RN Marta Antu and ED MD Dr Melina Copa. Both confirmed that the patient had received 2L of fluid bolus in the ED and 573mL from EMS. Requested that they enter an order and mark on the [facility] that it was given, as MAR only shows 1L having been given.

## 2021-09-22 NOTE — ED Triage Notes (Signed)
Pt brought in via EMS with c/c of epigastric abd pain radiating to back. Abd pain started 4 days ago but was worsen last night and constant. Family called EMS due to constant pain and radiating to shoulder.    557ml NSS 80/?? --> 115/68, 83% RA --> 97% NRB

## 2021-09-22 NOTE — Progress Notes (Signed)
Pharmacy Antibiotic Note  Deborah Jordan is a 87 y.o. female admitted on 09/22/2021 with sepsis - unknown source.  Pharmacy has been consulted for vancomycin/cefepime/flagyl dosing. SCr down to 1.2 (baseline ~0.6-0.8)  Plan: Cefepime 2g IV x 1; then 2g IV q24h Vancomycin 1750mg  IV x 1; then 750mg  IV q48h. Goal AUC 400-550. Expected AUC: 415 SCr used: 1.2 Flagyl 500mg  IV q12h Monitor clinical progress, c/s, renal function F/u de-escalation plan/LOT, vancomycin levels as indicated   Height: 4\' 9"  (144.8 cm) Weight: 77.1 kg (170 lb) IBW/kg (Calculated) : 38.6  Temp (24hrs), Avg:97.4 F (36.3 C), Min:97.4 F (36.3 C), Max:97.4 F (36.3 C)  Recent Labs  Lab 09/22/21 0811 09/22/21 0831 09/22/21 0858  WBC 33.8*  --   --   CREATININE 1.49* 1.20*  --   LATICACIDVEN  --   --  5.1*    Estimated Creatinine Clearance: 28.7 mL/min (A) (by C-G formula based on SCr of 1.2 mg/dL (H)).    Allergies  Allergen Reactions   Ace Inhibitors Cough   Hydrochlorothiazide     Low Sodium    Antimicrobials this admission: 10/5 vancomycin >>  10/5 cefepime >>  10/5 flagyl x 1  Dose adjustments this admission:   Microbiology results:   Arturo Morton, PharmD, BCPS Please check AMION for all [place] contact numbers Clinical Pharmacist 09/22/2021 10:16 AM

## 2021-09-22 NOTE — Progress Notes (Signed)
RT NOTE: ETT retracted 3cm to 22 at the lips per CCM per chest xray. Vitals are stable. RT will continue to monitor.

## 2021-09-22 NOTE — Progress Notes (Signed)
Pharmacy Antibiotic Note  Deborah Jordan is a 87 y.o. female admitted on 09/22/2021 with sepsis - unknown source.  Pharmacy has been consulted for vancomycin/cefepime/flagyl dosing. SCr down to 1.2 (baseline ~0.6-0.8).  Unclear if vancomycin load given in ED, vancomycin random level undetectable so unlikely.  Plan: Vancomycin 1750mg  IV x1 now; then 750mg  IV q48h.   Height: 4\' 9"  (144.8 cm) Weight: 83.6 kg (184 lb 4.9 oz) IBW/kg (Calculated) : 38.6  Temp (24hrs), Avg:97.1 F (36.2 C), Min:96.6 F (35.9 C), Max:97.4 F (36.3 C)  Recent Labs  Lab 09/22/21 0811 09/22/21 0831 09/22/21 0858 09/22/21 1203 09/22/21 1623 09/22/21 1624 09/22/21 1700  WBC 33.8*  --   --   --   --   --   --   CREATININE 1.49* 1.20*  --   --  1.11*  --   --   LATICACIDVEN  --   --  5.1* 4.0*  --  4.1*  --   VANCORANDOM  --   --   --   --   --   --  <4     Estimated Creatinine Clearance: 32.5 mL/min (A) (by C-G formula based on SCr of 1.11 mg/dL (H)).    Allergies  Allergen Reactions   Ace Inhibitors Cough   Hydrochlorothiazide     Low Sodium    Antimicrobials this admission: 10/5 vancomycin >>  10/5 cefepime >>  10/5 flagyl x 1  Dose adjustments this admission:   Microbiology results:   Arrie Senate, PharmD, BCPS, [facility] Clinical Pharmacist [phone] Please check AMION for all [place] numbers 09/22/2021

## 2021-09-22 NOTE — Procedures (Signed)
Arterial Catheter Insertion Procedure Note  Deborah Jordan  [id number]  27-Sep-1933  Date:09/22/21  Time:1:50 PM    Provider Performing: Jacky Kindle    Procedure: Insertion of Arterial Line [id number]) with US guidance (68616)   Indication(s) Blood pressure monitoring and/or need for frequent ABGs  Consent Risks of the procedure as well as the alternatives and risks of each were explained to the patient and/or caregiver.  Consent for the procedure was obtained and is signed in the bedside chart  Anesthesia None   Time Out Verified patient identification, verified procedure, site/side was marked, verified correct patient position, special equipment/implants available, medications/allergies/relevant history reviewed, required imaging and test results available.   Sterile Technique Maximal sterile technique including full sterile barrier drape, hand hygiene, sterile gown, sterile gloves, mask, hair covering, sterile ultrasound probe cover (if used).   Procedure Description Area of catheter insertion was cleaned with chlorhexidine and draped in sterile fashion. With real-time ultrasound guidance an arterial catheter was placed into the right  Axillary  artery.  Appropriate arterial tracings confirmed on monitor.     Complications/Tolerance None; patient tolerated the procedure well.   EBL Minimal   Specimen(s) None

## 2021-09-22 NOTE — ED Notes (Signed)
Attempted to call floor with report

## 2021-09-22 NOTE — Procedures (Signed)
Central Venous Catheter Insertion Procedure Note  Deborah Jordan  [id number]  1933/07/10  Date:09/22/21  Time:1:50 PM   Provider Performing:Vela Render   Procedure: Insertion of Non-tunneled Central Venous [phone]) with US guidance (61470)   Indication(s) Medication administration  Consent Risks of the procedure as well as the alternatives and risks of each were explained to the patient and/or caregiver.  Consent for the procedure was obtained and is signed in the bedside chart  Anesthesia Topical only with 1% lidocaine   Timeout Verified patient identification, verified procedure, site/side was marked, verified correct patient position, special equipment/implants available, medications/allergies/relevant history reviewed, required imaging and test results available.  Sterile Technique Maximal sterile technique including full sterile barrier drape, hand hygiene, sterile gown, sterile gloves, mask, hair covering, sterile ultrasound probe cover (if used).  Procedure Description Area of catheter insertion was cleaned with chlorhexidine and draped in sterile fashion.  With real-time ultrasound guidance a central venous catheter was placed into the right subclavian vein. Nonpulsatile blood flow and easy flushing noted in all ports.  The catheter was sutured in place and sterile dressing applied.  Complications/Tolerance None; patient tolerated the procedure well. Chest X-ray is ordered to verify placement for internal jugular or subclavian cannulation.   Chest x-ray is not ordered for femoral cannulation.  EBL Minimal  Specimen(s) None

## 2021-09-22 NOTE — ED Notes (Signed)
1L LR started at this time per MD verbal order

## 2021-09-22 NOTE — Procedures (Signed)
Intubation Procedure Note  JENNET SCROGGIN  [id number]  [birth year]  Date:09/22/21  Time:1:47 PM   Provider Performing:Sammye Staff    Procedure: Intubation (31500)  Indication(s) Respiratory Failure  Consent Risks of the procedure as well as the alternatives and risks of each were explained to the patient and/or caregiver.  Consent for the procedure was obtained and is signed in the bedside chart   Anesthesia Etomidate and Rocuronium   Time Out Verified patient identification, verified procedure, site/side was marked, verified correct patient position, special equipment/implants available, medications/allergies/relevant history reviewed, required imaging and test results available.   Sterile Technique Usual hand hygeine, masks, and gloves were used   Procedure Description Patient positioned in bed supine.  Sedation given as noted above.  Patient was intubated with endotracheal tube using  MAC4 .  View was Grade 2 only posterior commissure .  Number of attempts was 1.  Colorimetric CO2 detector was consistent with tracheal placement.   Complications/Tolerance None; patient tolerated the procedure well. Chest X-ray is ordered to verify placement.   EBL Minimal   Specimen(s) None

## 2021-09-22 NOTE — H&P (Signed)
NAME:  Deborah Jordan, MRN:  [id number], DOB:  10/20/1933, LOS: 0 ADMISSION DATE:  09/22/2021, CONSULTATION DATE:  09/22/21 REFERRING MD:  Melina Copa CHIEF COMPLAINT:  Abd pain   History of Present Illness:  Deborah Jordan is a 87 y.o. female who has a PMH as outlined below.  She presented to [facility] ED 10/5 with abd pain x 3 - 4 days.  Initially was intermittent then night of 10/4, became constant and worsened with radiation into back.  EMS was called and she was found to be hypotensive in the 80s.  She was brought to ED where she was still hypotensive.  She received 2L fluids without improvement so was started on Levophed.  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  Due to shock, PCCM asked to admit to ICU.  Pertinent  Medical History:  has Degenerative arthritis of hip; Pleural effusion; Hyponatremia; Hypertension; Bilateral chronic knee pain; Chronic bilateral low back pain with bilateral sciatica; Dyspnea and respiratory abnormalities; and Septic shock ([place]) on their problem list.  Significant Hospital Events: Including procedures, antibiotic start and stop dates in addition to other pertinent events   10/5 > admit.  Interim History / Subjective:  On 20 levophed with SBP in 80s and 90s.  Objective:  Blood pressure (!) 93/42, pulse 80, temperature (!) 97.4 F (36.3 C), temperature source Oral, resp. rate (!) 24, height 4\' 9"  (1.448 m), weight 77.1 kg, SpO2 96 %.        Intake/Output Summary (Last 24 hours) at 09/22/2021 1009 Last data filed at 09/22/2021 0825 Gross per 24 hour  Intake 1000 ml  Output --  Net 1000 ml   Filed Weights   09/22/21 0815  Weight: 77.1 kg    Examination: General: Adult female, elderly, resting in bed, in NAD. Neuro: Sleepy but easily arouses to voice.  MAE's. HEENT: [place]/AT. Sclerae  anicteric. EOMI.  MM dry. Cardiovascular: RRR, no M/R/G.  Lungs: Respirations even and unlabored.  CTA bilaterally, No W/R/R. Abdomen: BS x 4, soft, tender to deep palpation. Musculoskeletal: No gross deformities, no edema.  Skin: Intact, warm, no rashes.  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  Assessment & Plan:   Shock - unclear etiology but must cover for septic given her presentation with WBC of 34.  Also likely component hypovolemic given physical exam and labs. - Continue fluids. - Continue Levophed as needed for goal MAP > 65. - Continue empiric Vanc, Cefepime, Flagyl. - Pan culture.  AKI. Hypokalemia. Hyponatremia. AGMA. - Continue fluids. - Follow BMP.  Transaminitis - presumed shock liver. - Trend LFT's.  Subtotal occlusion of celiac artery. - Supportive care. - Continue home ASA. - Can consider curbsiding vascular surgery.  Hx CAD, HTN, HLD, dCHF, LVH, RBB. - Continue home ASA. - Hold home Amlodipine, Carvedilol, Furosemide.  Hx anxiety. - Hold home Duloxetine, Gabapentin.   Best practice (evaluated daily):  Diet/type: NPO DVT prophylaxis: prophylactic heparin  GI prophylaxis: N/A and PPI Lines: Central line Foley:  N/A Code Status:  full code Last date of multidisciplinary goals of care discussion: None.  Labs   CBC: Recent Labs  Lab 09/22/21 [phone] 09/22/21 0831  WBC 33.8*  --   NEUTROABS PENDING  --   HGB 9.6* 10.2*  HCT 29.6* 30.0*  MCV 90.0  --   PLT 243  --     Basic Metabolic Panel: Recent Labs  Lab 09/22/21 0811 09/22/21 0831  NA 131* 131*  K 3.3* 3.2*  CL 98 97*  CO2 17*  --   GLUCOSE 78 80  BUN 20 20  CREATININE 1.49* 1.20*  CALCIUM 8.9  --    GFR: Estimated Creatinine Clearance: 28.7 mL/min (A) (by  C-G formula based on SCr of 1.2 mg/dL (H)). Recent Labs  Lab 09/22/21 0811 09/22/21 0858  WBC 33.8*  --   LATICACIDVEN  --  5.1*    Liver Function Tests: Recent Labs  Lab 09/22/21 0811  AST 468*  ALT 499*  ALKPHOS 186*  BILITOT 2.3*  PROT 5.1*  ALBUMIN 2.8*   Recent Labs  Lab 09/22/21 0811  LIPASE 135*   No results for input(s): AMMONIA in the last 168 hours.  ABG    Component Value Date/Time   TCO2 19 (L) 09/22/2021 0831     Coagulation Profile: Recent Labs  Lab 09/22/21 0811  INR 1.3*    Cardiac Enzymes: No results for input(s): CKTOTAL, CKMB, CKMBINDEX, TROPONINI in the last 168 hours.  HbA1C: No results found for: HGBA1C  CBG: No results for input(s): GLUCAP in the last 168 hours.  Review of Systems:   Unable to obtain as pt is somnolent.  Past Medical History:  She,  has a past medical history of Anxiety, Arthritis, Bilateral cataracts, CAD (coronary artery disease), Chronic kidney disease, DDD (degenerative disc disease), lumbar, Dyspnea, Elevated cholesterol, GERD (gastroesophageal reflux disease), Grade I diastolic dysfunction (36/14/4315), History of cardiomegaly, History of gallstones, Hypertension, LVH (left ventricular hypertrophy) (05/04/2017), Mild depression ([place]), Pre-diabetes, RBBB (right bundle branch block), Spinal stenosis, Tubular adenoma, Vitamin D deficiency, and Wears partial dentures.   Surgical History:   Past Surgical History:  Procedure Laterality Date   CATARACT EXTRACTION Bilateral 03/06/2013   CHOLECYSTECTOMY     COLONOSCOPY     CRYOTHERAPY     DILATION AND CURETTAGE OF UTERUS     esi     HEMORRHOID SURGERY N/A 09/21/2018   Procedure: SINGLE COLUMN HEMORRHOIDECTOMY, HEMORRHOIDPEXY;  Surgeon: Leighton Ruff, MD;  Location: [place];  Service: General;  Laterality: N/A;   TOTAL HIP ARTHROPLASTY Left 03/08/2013   Procedure: LEFT TOTAL HIP ARTHROPLASTY ANTERIOR APPROACH;  Surgeon: Mcarthur Rossetti,  MD;  Location: [facility];  Service: Orthopedics;  Laterality: Left;     Social History:   reports that she has never smoked. She has never used smokeless tobacco. She reports current alcohol use. She reports that she does not use drugs.   Family History:  Her family history includes Atrial fibrillation in her sister; Diabetes in her father; Heart disease in her father; Heart failure in her father.   Allergies Allergies  Allergen Reactions   Ace Inhibitors Cough   Hydrochlorothiazide     Low Sodium     Home Medications  Prior to Admission medications   Medication Sig Start Date End Date Taking? Authorizing Provider  acetaminophen (TYLENOL) 650 MG CR tablet Take 650 mg by mouth every 8 (eight) hours as needed for pain.    [provider]  amLODipine (NORVASC) 10 MG tablet Take 10 mg by mouth daily.    [provider]  aspirin EC 81 MG tablet Take 81 mg by mouth daily. Swallow whole.  [provider]  carvedilol (COREG) 12.5 MG tablet Take 12.5 mg by mouth 2 (two) times daily with a meal.    [provider]  Cholecalciferol (VITAMIN D) 2000 units CAPS Take 2,000 Units by mouth daily.    [provider]  DULoxetine (CYMBALTA) 20 MG capsule Take 1 capsule (20 mg total) by mouth daily. 06/03/21   Jessy Oto, MD  esomeprazole (NEXIUM) 40 MG capsule Take 40 mg by mouth daily.    [provider]  furosemide (LASIX) 40 MG tablet Take 40 mg by mouth daily. Patient not taking: Reported on 06/28/2021 06/26/21   [provider]  gabapentin (NEURONTIN) 100 MG capsule Take 1 capsule (100 mg total) by mouth at bedtime. 06/03/21   Jessy Oto, MD  loratadine (CLARITIN) 10 MG tablet Take 10 mg by mouth daily.    [provider]  sodium chloride 1 g tablet Take 1 tablet (1 g total) by mouth 3 (three) times daily with meals. 06/22/21   Little Ishikawa, MD  traMADol-acetaminophen (ULTRACET) 37.5-325 MG tablet Take 1 tablet by mouth  every 6 (six) hours as needed. 07/15/21   Jessy Oto, MD     Critical care time: 40 min.   Montey Hora, [place] For pager details, please see AMION or use Epic chat  After 1900, please call [place] for cross coverage needs 09/22/2021, 10:09 AM

## 2021-09-22 NOTE — ED Notes (Signed)
Report given to Mickel Baas, RN of [phone]

## 2021-09-22 NOTE — Sepsis Progress Note (Signed)
eLink is monitoring this Code Sepsis. °

## 2021-09-23 ENCOUNTER — Inpatient Hospital Stay (HOSPITAL_COMMUNITY): Payer: Medicare Other

## 2021-09-23 DIAGNOSIS — A419 Sepsis, unspecified organism: Secondary | ICD-10-CM | POA: Diagnosis not present

## 2021-09-23 DIAGNOSIS — R7401 Elevation of levels of liver transaminase levels: Secondary | ICD-10-CM | POA: Diagnosis not present

## 2021-09-23 DIAGNOSIS — R6521 Severe sepsis with septic shock: Secondary | ICD-10-CM | POA: Diagnosis not present

## 2021-09-23 DIAGNOSIS — B955 Unspecified streptococcus as the cause of diseases classified elsewhere: Secondary | ICD-10-CM

## 2021-09-23 DIAGNOSIS — R7881 Bacteremia: Secondary | ICD-10-CM | POA: Diagnosis not present

## 2021-09-23 DIAGNOSIS — A498 Other bacterial infections of unspecified site: Secondary | ICD-10-CM

## 2021-09-23 DIAGNOSIS — N179 Acute kidney failure, unspecified: Secondary | ICD-10-CM | POA: Diagnosis not present

## 2021-09-23 LAB — BLOOD CULTURE ID PANEL (REFLEXED) - BCID2
A.calcoaceticus-baumannii: NOT DETECTED
Bacteroides fragilis: NOT DETECTED
CTX-M ESBL: NOT DETECTED
Candida albicans: NOT DETECTED
Candida auris: NOT DETECTED
Candida glabrata: NOT DETECTED
Candida krusei: NOT DETECTED
Candida parapsilosis: NOT DETECTED
Candida tropicalis: NOT DETECTED
Carbapenem resist OXA 48 LIKE: NOT DETECTED
Carbapenem resistance IMP: NOT DETECTED
Carbapenem resistance KPC: NOT DETECTED
Carbapenem resistance NDM: NOT DETECTED
Carbapenem resistance VIM: NOT DETECTED
Cryptococcus neoformans/gattii: NOT DETECTED
Enterobacter cloacae complex: NOT DETECTED
Enterobacterales: DETECTED — AB
Enterococcus Faecium: NOT DETECTED
Enterococcus faecalis: NOT DETECTED
Escherichia coli: DETECTED — AB
Haemophilus influenzae: NOT DETECTED
Klebsiella aerogenes: NOT DETECTED
Klebsiella oxytoca: NOT DETECTED
Klebsiella pneumoniae: NOT DETECTED
Listeria monocytogenes: NOT DETECTED
Neisseria meningitidis: NOT DETECTED
Proteus species: NOT DETECTED
Pseudomonas aeruginosa: NOT DETECTED
Salmonella species: NOT DETECTED
Serratia marcescens: NOT DETECTED
Staphylococcus aureus (BCID): NOT DETECTED
Staphylococcus epidermidis: NOT DETECTED
Staphylococcus lugdunensis: NOT DETECTED
Staphylococcus species: NOT DETECTED
Stenotrophomonas maltophilia: NOT DETECTED
Streptococcus agalactiae: NOT DETECTED
Streptococcus pneumoniae: NOT DETECTED
Streptococcus pyogenes: NOT DETECTED
Streptococcus species: DETECTED — AB

## 2021-09-23 LAB — CBC
HCT: 30 % — ABNORMAL LOW (ref 36.0–46.0)
Hemoglobin: 10.3 g/dL — ABNORMAL LOW (ref 12.0–15.0)
MCH: 29.3 pg (ref 26.0–34.0)
MCHC: 34.3 g/dL (ref 30.0–36.0)
MCV: 85.5 fL (ref 80.0–100.0)
Platelets: 223 10*3/uL (ref 150–400)
RBC: 3.51 MIL/uL — ABNORMAL LOW (ref 3.87–5.11)
RDW: 14.2 % (ref 11.5–15.5)
WBC: 46.3 10*3/uL — ABNORMAL HIGH (ref 4.0–10.5)
nRBC: 0 % (ref 0.0–0.2)

## 2021-09-23 LAB — GLUCOSE, CAPILLARY
Glucose-Capillary: 113 mg/dL — ABNORMAL HIGH (ref 70–99)
Glucose-Capillary: 97 mg/dL (ref 70–99)
Glucose-Capillary: 125 mg/dL — ABNORMAL HIGH (ref 70–99)
Glucose-Capillary: 122 mg/dL — ABNORMAL HIGH (ref 70–99)
Glucose-Capillary: 113 mg/dL — ABNORMAL HIGH (ref 70–99)
Glucose-Capillary: 96 mg/dL (ref 70–99)

## 2021-09-23 LAB — BASIC METABOLIC PANEL
Anion gap: 11 (ref 5–15)
BUN: 20 mg/dL (ref 8–23)
CO2: 19 mmol/L — ABNORMAL LOW (ref 22–32)
Calcium: 8.4 mg/dL — ABNORMAL LOW (ref 8.9–10.3)
Chloride: 101 mmol/L (ref 98–111)
Creatinine, Ser: 1.11 mg/dL — ABNORMAL HIGH (ref 0.44–1.00)
GFR, Estimated: 48 mL/min — ABNORMAL LOW (ref >60)
Glucose, Bld: 116 mg/dL — ABNORMAL HIGH (ref 70–99)
Potassium: 4.3 mmol/L (ref 3.5–5.1)
Sodium: 131 mmol/L — ABNORMAL LOW (ref 135–145)

## 2021-09-23 LAB — HEPATIC FUNCTION PANEL
ALT: 370 U/L — ABNORMAL HIGH (ref 0–44)
AST: 240 U/L — ABNORMAL HIGH (ref 15–41)
Albumin: 2.4 g/dL — ABNORMAL LOW (ref 3.5–5.0)
Alkaline Phosphatase: 153 U/L — ABNORMAL HIGH (ref 38–126)
Bilirubin, Direct: 0.7 mg/dL — ABNORMAL HIGH (ref 0.0–0.2)
Indirect Bilirubin: 0.7 mg/dL (ref 0.3–0.9)
Total Bilirubin: 1.4 mg/dL — ABNORMAL HIGH (ref 0.3–1.2)
Total Protein: 5.1 g/dL — ABNORMAL LOW (ref 6.5–8.1)

## 2021-09-23 LAB — MAGNESIUM
Magnesium: 1.2 mg/dL — ABNORMAL LOW (ref 1.7–2.4)
Magnesium: 2.5 mg/dL — ABNORMAL HIGH (ref 1.7–2.4)
Magnesium: 2.7 mg/dL — ABNORMAL HIGH (ref 1.7–2.4)
Magnesium: 2.4 mg/dL (ref 1.7–2.4)

## 2021-09-23 LAB — LACTIC ACID, PLASMA
Lactic Acid, Venous: 2.3 mmol/L (ref 0.5–1.9)
Lactic Acid, Venous: 2.1 mmol/L (ref 0.5–1.9)

## 2021-09-23 LAB — PHOSPHORUS: Phosphorus: 4.4 mg/dL (ref 2.5–4.6)

## 2021-09-23 MED ORDER — POLYETHYLENE GLYCOL 3350 17 G PO PACK: 17.0000 g | PACK | Freq: Every day | ORAL | Status: DC | PRN

## 2021-09-23 MED ORDER — REVEFENACIN 175 MCG/3ML IN SOLN
175.0000 ug | Freq: Every day | RESPIRATORY_TRACT | Status: DC
  Administered 2021-09-23 – 2021-10-04 (×12): 175 ug via RESPIRATORY_TRACT
  Filled 2021-09-23 (×12): qty 3

## 2021-09-23 MED ORDER — ALPRAZOLAM 0.5 MG PO TABS: 0.2500 mg | ORAL_TABLET | Freq: Three times a day (TID) | ORAL | Status: DC | PRN

## 2021-09-23 MED ORDER — ARFORMOTEROL TARTRATE 15 MCG/2ML IN NEBU
15.0000 ug | INHALATION_SOLUTION | Freq: Two times a day (BID) | RESPIRATORY_TRACT | Status: DC
  Administered 2021-09-23 – 2021-10-04 (×22): 15 ug via RESPIRATORY_TRACT
  Filled 2021-09-23 (×24): qty 2

## 2021-09-23 MED ORDER — CHLORHEXIDINE GLUCONATE 0.12% ORAL RINSE (MEDLINE KIT)
15.0000 mL | Freq: Two times a day (BID) | OROMUCOSAL | Status: DC
  Administered 2021-09-23 – 2021-09-26 (×7): 15 mL via OROMUCOSAL

## 2021-09-23 MED ORDER — ORAL CARE MOUTH RINSE
15.0000 mL | OROMUCOSAL | Status: DC
  Administered 2021-09-23 – 2021-09-26 (×31): 15 mL via OROMUCOSAL

## 2021-09-23 MED ORDER — SODIUM CHLORIDE 0.9% FLUSH: 10.0000 mL | INTRAVENOUS | Status: DC | PRN

## 2021-09-23 MED ORDER — MAGNESIUM SULFATE 2 GM/50ML IV SOLN
2.0000 g | Freq: Once | INTRAVENOUS | Status: DC
  Filled 2021-09-23 (×2): qty 50

## 2021-09-23 MED ORDER — ALPRAZOLAM 0.5 MG PO TABS
0.2500 mg | ORAL_TABLET | Freq: Three times a day (TID) | ORAL | Status: DC | PRN
  Administered 2021-09-24 – 2021-09-25 (×3): 0.25 mg
  Filled 2021-09-23 (×3): qty 1

## 2021-09-23 MED ORDER — SODIUM CHLORIDE 0.9 % IV SOLN
2.0000 g | INTRAVENOUS | Status: DC
  Administered 2021-09-23 – 2021-09-27 (×5): 2 g via INTRAVENOUS
  Filled 2021-09-23 (×5): qty 20

## 2021-09-23 MED ORDER — PROSOURCE TF PO LIQD: 45.0000 mL | Freq: Two times a day (BID) | ORAL | Status: DC

## 2021-09-23 MED ORDER — DEXMEDETOMIDINE HCL IN NACL 400 MCG/100ML IV SOLN
0.0000 ug/kg/h | INTRAVENOUS | Status: AC
  Administered 2021-09-23: 0.7 ug/kg/h via INTRAVENOUS
  Administered 2021-09-23: 0.8 ug/kg/h via INTRAVENOUS
  Administered 2021-09-24 (×3): 1.2 ug/kg/h via INTRAVENOUS
  Administered 2021-09-24: 0.7 ug/kg/h via INTRAVENOUS
  Administered 2021-09-25 (×3): 1.2 ug/kg/h via INTRAVENOUS
  Filled 2021-09-23 (×10): qty 100

## 2021-09-23 MED ORDER — VITAL AF 1.2 CAL PO LIQD
1000.0000 mL | ORAL | Status: DC
  Administered 2021-09-23 – 2021-09-25 (×3): 1000 mL

## 2021-09-23 MED ORDER — MAGNESIUM SULFATE 4 GM/100ML IV SOLN
4.0000 g | Freq: Once | INTRAVENOUS | Status: AC
  Administered 2021-09-23: 4 g via INTRAVENOUS
  Filled 2021-09-23: qty 100

## 2021-09-23 MED ORDER — DOCUSATE SODIUM 50 MG/5ML PO LIQD: 100.0000 mg | Freq: Two times a day (BID) | ORAL | Status: DC | PRN

## 2021-09-23 MED ORDER — CHLORHEXIDINE GLUCONATE CLOTH 2 % EX PADS
6.0000 | MEDICATED_PAD | Freq: Every day | CUTANEOUS | Status: DC
  Administered 2021-09-23 – 2021-09-25 (×3): 6 via TOPICAL

## 2021-09-23 MED ORDER — SODIUM CHLORIDE 0.9 % IV SOLN
INTRAVENOUS | Status: DC | PRN
  Administered 2021-09-24: 1000 mL via INTRAVENOUS
  Administered 2021-09-25: 250 mL via INTRAVENOUS

## 2021-09-23 MED ORDER — SODIUM CHLORIDE 0.9% FLUSH
10.0000 mL | Freq: Two times a day (BID) | INTRAVENOUS | Status: DC
  Administered 2021-09-23 – 2021-09-24 (×3): 10 mL
  Administered 2021-09-24: 20 mL
  Administered 2021-09-25 (×2): 10 mL

## 2021-09-23 MED ORDER — ASPIRIN 81 MG PO CHEW
81.0000 mg | CHEWABLE_TABLET | Freq: Every day | ORAL | Status: DC
  Administered 2021-09-24 – 2021-09-26 (×3): 81 mg
  Filled 2021-09-23 (×3): qty 1

## 2021-09-23 MED ORDER — LACTATED RINGERS IV BOLUS
500.0000 mL | Freq: Once | INTRAVENOUS | Status: AC
  Administered 2021-09-23: 500 mL via INTRAVENOUS

## 2021-09-23 NOTE — Progress Notes (Signed)
PHARMACY - PHYSICIAN COMMUNICATION CRITICAL VALUE ALERT - BLOOD CULTURE IDENTIFICATION (BCID)  Deborah Jordan is an 87 y.o. female who presented to [facility] on 09/22/2021 with a chief complaint of N/V and abdominal pain, hypotension/sepsis  Assessment:   2/2 blood cultures growing Streptococcus species 1/2 blood cultures growing E. coli  Name of physician (or Provider) Contacted:  Dr. Salley Slaughter  Current antibiotics: Vancomycin and Cefepime and  Flagyl  Changes to prescribed antibiotics recommended:   Consider narrowing to Rocephin 2 g IV q24h when appropriate.    Results for orders placed or performed during the hospital encounter of 09/22/21  Blood Culture ID Panel (Reflexed) (Collected: 09/22/2021  8:15 AM)  Result Value Ref Range   Enterococcus faecalis NOT DETECTED NOT DETECTED   Enterococcus Faecium NOT DETECTED NOT DETECTED   Listeria monocytogenes NOT DETECTED NOT DETECTED   Staphylococcus species NOT DETECTED NOT DETECTED   Staphylococcus aureus (BCID) NOT DETECTED NOT DETECTED   Staphylococcus epidermidis NOT DETECTED NOT DETECTED   Staphylococcus lugdunensis NOT DETECTED NOT DETECTED   Streptococcus species DETECTED (A) NOT DETECTED   Streptococcus agalactiae NOT DETECTED NOT DETECTED   Streptococcus pneumoniae NOT DETECTED NOT DETECTED   Streptococcus pyogenes NOT DETECTED NOT DETECTED   A.calcoaceticus-baumannii NOT DETECTED NOT DETECTED   Bacteroides fragilis NOT DETECTED NOT DETECTED   Enterobacterales DETECTED (A) NOT DETECTED   Enterobacter cloacae complex NOT DETECTED NOT DETECTED   Escherichia coli DETECTED (A) NOT DETECTED   Klebsiella aerogenes NOT DETECTED NOT DETECTED   Klebsiella oxytoca NOT DETECTED NOT DETECTED   Klebsiella pneumoniae NOT DETECTED NOT DETECTED   Proteus species NOT DETECTED NOT DETECTED   Salmonella species NOT DETECTED NOT DETECTED   Serratia marcescens NOT DETECTED NOT DETECTED   Haemophilus influenzae NOT DETECTED NOT DETECTED    Neisseria meningitidis NOT DETECTED NOT DETECTED   Pseudomonas aeruginosa NOT DETECTED NOT DETECTED   Stenotrophomonas maltophilia NOT DETECTED NOT DETECTED   Candida albicans NOT DETECTED NOT DETECTED   Candida auris NOT DETECTED NOT DETECTED   Candida glabrata NOT DETECTED NOT DETECTED   Candida krusei NOT DETECTED NOT DETECTED   Candida parapsilosis NOT DETECTED NOT DETECTED   Candida tropicalis NOT DETECTED NOT DETECTED   Cryptococcus neoformans/gattii NOT DETECTED NOT DETECTED   CTX-M ESBL NOT DETECTED NOT DETECTED   Carbapenem resistance IMP NOT DETECTED NOT DETECTED   Carbapenem resistance KPC NOT DETECTED NOT DETECTED   Carbapenem resistance NDM NOT DETECTED NOT DETECTED   Carbapenem resist OXA 48 LIKE NOT DETECTED NOT DETECTED   Carbapenem resistance VIM NOT DETECTED NOT DETECTED    Caryl Pina 09/23/2021  1:49 AM

## 2021-09-23 NOTE — Progress Notes (Addendum)
-+-  Initial Nutrition Assessment  DOCUMENTATION CODES:   Obesity unspecified  INTERVENTION:   Initiate tube feeding via OG tube: Vital AF 1.2 at 20 ml/h, increase by 10 ml every 8 hours to goal rate of 50 ml/h (1200 ml per day)  Provides 1440 kcal, 90 gm protein, 973 ml free water daily  NUTRITION DIAGNOSIS:   Inadequate oral intake related to inability to eat as evidenced by NPO status.  GOAL:   Patient will meet greater than or equal to 90% of their needs  MONITOR:   Vent status, Labs, TF tolerance  REASON FOR ASSESSMENT:   Rounds    ASSESSMENT:   87 yo female admitted with shock in the setting of bacteremia d/t E. Coli and Strep infantarius, hypotension. PMH includes HTN, GERD, cardiomegaly, DDD, spinal stenosis, LVH, CKD, HLD, vitamin D deficiency, CAD.  Discussed patient in ICU rounds and with RN today. Per discussion with CCM, okay to begin tube feedings at a low rate and advance as tolerated. OG tube in place.   Patient is currently intubated on ventilator support MV: 7.4 L/min Temp (24hrs), Avg:97.6 F (36.4 C), Min:96.6 F (35.9 C), Max:98.3 F (36.8 C)   Labs reviewed. Na 131 CBG: 97-125  Medications reviewed and include Miralax, precedex, mag sulfate. Levophed and vasopressin off since this morning.  Weight history reviewed. No significant weight changes noted recently. Weight trending up since admission.  I/O +3.5 L since admission.  NUTRITION - FOCUSED PHYSICAL EXAM:  Flowsheet Row Most Recent Value  Orbital Region No depletion  Upper Arm Region No depletion  Thoracic and Lumbar Region No depletion  Buccal Region Unable to assess  Temple Region No depletion  Clavicle Bone Region No depletion  Clavicle and Acromion Bone Region No depletion  Scapular Bone Region Unable to assess  Dorsal Hand No depletion  Patellar Region No depletion  Anterior Thigh Region No depletion  Posterior Calf Region No depletion  Edema (RD Assessment) Mild   Hair Reviewed  Eyes Unable to assess  Mouth Unable to assess  Skin Reviewed  Nails Reviewed       Diet Order:   Diet Order             Diet NPO time specified  Diet effective now                   EDUCATION NEEDS:   Not appropriate for education at this time  Skin:  Skin Assessment: Reviewed RN Assessment (MASD to thigh)  Last BM:  10/4  Height:   Ht Readings from Last 1 Encounters:  09/22/21 4\' 9"  (1.448 m)    Weight:   Wt Readings from Last 1 Encounters:  09/23/21 86.9 kg    BMI:  Body mass index is 41.46 kg/m.  Estimated Nutritional Needs:   Kcal:  1200-1400  Protein:  90-100 gm  Fluid:  >/= 1.5 L    Lucas Mallow, RD, LDN, CNSC Please refer to Amion for contact information.

## 2021-09-23 NOTE — Progress Notes (Signed)
NAME:  Deborah Jordan, MRN:  [id number], DOB:  [birth year], LOS: 1 ADMISSION DATE:  09/22/2021, CONSULTATION DATE:  09/22/21 REFERRING MD:  Melina Copa CHIEF COMPLAINT:  Abd pain   History of Present Illness:  Deborah Jordan is a 87 y.o. female who has a PMH as outlined below.  She presented to [facility] ED 10/5 with abd pain x 3 - 4 days.  Initially was intermittent then night of 10/4, became constant and worsened with radiation into back.  EMS was called and she was found to be hypotensive in the 80s.  She was brought to ED where she was still hypotensive.  She received 2L fluids without improvement so was started on Levophed.  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  Due to shock, PCCM asked to admit to ICU.  Pertinent  Medical History:  has Degenerative arthritis of hip; Pleural effusion; Hyponatremia; Hypertension; Bilateral chronic knee pain; Chronic bilateral low back pain with bilateral sciatica; Dyspnea and respiratory abnormalities; Septic shock ([place]); AKI (acute kidney injury) ([place]); Hypokalemia; and Transaminitis on their problem list.  Significant Hospital Events: Including procedures, antibiotic start and stop dates in addition to other pertinent events   10/5 > admit.  Interim History / Subjective:   Weaned off vasopressor support this morning. Patient following commands. Daughter is at the bedside.   Lactic acid down trending to 2.3 from 4.1 on admission.  Objective:  Blood pressure (!) 98/50, pulse 69, temperature (!) 96.8 F (36 C), temperature source Axillary, resp. rate 18, height 4\' 9"  (1.448 m), weight 86.9 kg, SpO2 94 %.    Vent Mode: PRVC FiO2 (%):  [90 %-100 %] 90 % Set Rate:  [20 bmp-30 bmp] 20 bmp Vt Set:  [300 mL-360 mL] 300 mL PEEP:  [5 cmH20] 5 cmH20 Plateau Pressure:  [17 cmH20-20 cmH20] 17  cmH20   Intake/Output Summary (Last 24 hours) at 09/23/2021 0833 Last data filed at 09/23/2021 0800 Gross per 24 hour  Intake 2241.08 ml  Output 575 ml  Net 1666.08 ml   Filed Weights   09/22/21 0815 09/22/21 1116 09/23/21 0500  Weight: 77.1 kg 83.6 kg 86.9 kg    Examination: General: Adult female, elderly, resting in bed, in NAD. Neuro: arousable, follows commands, moving all extremities HEENT: [place]/AT. Sclerae anicteric. PERRL Cardiovascular: RRR, no M/R/G.  Lungs: belly breathing, course breath sounds. No wheezing. Abdomen: BS x 4, soft, non-tender.  Musculoskeletal: No gross deformities, no edema.  Skin: Intact, warm, no rashes.  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  Assessment & Plan:  Shock  In setting of bacteremia due to E. Coli and Strep infantarius. Possibly due to gut translocation given her underlying vascular issues. Differential also includes cardiogenic shock. - Antibiotics narrowed to flagyl and ceftriaxone. ID will evaluate patient due to blood culture results. -Check Echo  Bacteremia Blood cultures showing E. Coli and Strep infantarious.  - Will repeat blood cultures today - ID following  AKI. Hypokalemia. Hyponatremia. AGMA Lactic Acidosis - Follow BMP. - replete electrolytes as needed  Transaminitis - presumed shock liver. - Trend LFT's.  Subtotal occlusion of celiac artery. - Supportive care. - Continue home ASA. -  No signs of acute abdomen, will consider vascular surgery consult if needed.  Hx CAD, HTN, HLD, dCHF, LVH, RBB. - Continue home ASA. - Hold home Amlodipine, Carvedilol, Furosemide.  Hx anxiety. - Hold home Duloxetine, Gabapentin. - PRN ativan  Best practice (evaluated daily):  Diet/type: tubefeeds DVT  prophylaxis: prophylactic heparin  GI prophylaxis: N/A and PPI Lines: Central line Foley:  N/A Code Status:  full code Last date of multidisciplinary goals of care discussion: None.  Labs   CBC: Recent Labs  Lab 09/22/21 0811 09/22/21 0831 09/22/21 1359 09/23/21 0343  WBC 33.8*  --   --  46.3*  NEUTROABS 29.7*  --   --   --   HGB 9.6* 10.2* 10.9* 10.3*  HCT 29.6* 30.0* 32.0* 30.0*  MCV 90.0  --   --  85.5  PLT 243  --   --  997    Basic Metabolic Panel: Recent Labs  Lab 09/22/21 0811 09/22/21 0831 09/22/21 1359 09/22/21 1623 09/23/21 0343  NA 131* 131* 133* 130* 131*  K 3.3* 3.2* 3.1* 4.4 4.3  CL 98 97*  --  100 101  CO2 17*  --   --  19* 19*  GLUCOSE 78 80  --  107* 116*  BUN 20 20  --  17 20  CREATININE 1.49* 1.20*  --  1.11* 1.11*  CALCIUM 8.9  --   --  8.8* 8.4*  MG  --   --   --   --  1.2*  PHOS  --   --   --   --  4.4   GFR: Estimated Creatinine Clearance: 33.3 mL/min (A) (by C-G formula based on SCr of 1.11 mg/dL (H)). Recent Labs  Lab 09/22/21 0811 09/22/21 0858 09/22/21 1203 09/22/21 1624 09/22/21 1955 09/23/21 0343  WBC 33.8*  --   --   --   --  46.3*  LATICACIDVEN  --  5.1* 4.0* 4.1* 3.9*  --     Liver Function Tests: Recent Labs  Lab 09/22/21 0811 09/23/21 0343  AST 468* 240*  ALT 499* 370*  ALKPHOS 186* 153*  BILITOT 2.3* 1.4*  PROT 5.1* 5.1*  ALBUMIN 2.8* 2.4*   Recent Labs  Lab 09/22/21 0811  LIPASE 135*   No results for input(s): AMMONIA in the last 168 hours.  ABG    Component Value Date/Time   PHART 7.389 09/22/2021 2329   PCO2ART 30.3 (L) 09/22/2021 2329   PO2ART 88.2 09/22/2021 2329   HCO3 18.0 (L) 09/22/2021 2329   TCO2 25 09/22/2021 1359   ACIDBASEDEF 6.1 (H) 09/22/2021 2329   O2SAT 96.8 09/22/2021 2329     Coagulation Profile: Recent Labs  Lab 09/22/21 0811  INR 1.3*    Cardiac Enzymes: No results for input(s): CKTOTAL, CKMB, CKMBINDEX, TROPONINI in the last 168 hours.  HbA1C: No results found  for: HGBA1C  CBG: Recent Labs  Lab 09/22/21 1216 09/22/21 1715 09/23/21 0334 09/23/21 0810  GLUCAP 96 94 113* 97    Critical care time:  45 min.   Freda Jackson, MD [facility] Office: [phone]   See Amion for personal pager PCCM on call pager [phone] until 7pm. Please call Elink 7p-7a. [phone]

## 2021-09-23 NOTE — Progress Notes (Signed)
Mg 1.2 Replaced per protocol  

## 2021-09-23 NOTE — Consult Note (Signed)
[facility]  Total days of antibiotics 2  Reason for Consult: polymicrobial bacteremia in the setting of sepsis   Referring Physician:dewald  Active Problems:   Septic shock ([place])   AKI (acute kidney injury) ([place])   Hypokalemia   Transaminitis    HPI: Deborah Jordan is a 87 y.o. female with hx of CAD, CKD 4, HTN, was found down at home with AMS.In the days preceeding admission, she compalined of worsening abdominal pain radiating to back. Denies nausea and vomiting or diarrhea. She was found down, EMS found her hypotensive with sBP of 70s. On admit, she received fluid resuscitation, vasporessors and intubated due to respiratory distress. Labs significant for lactic acidosis of 5. Transaminitis of 240-370s, initial wbc of 10 ->increased to 46K. Infectious work up revealed strep species and e.coli in blood cx. She underwent chest and Abd CT-A to rule out dissection - she was found to have no acute finidings but moderate to large amount of atherosclerotic plaque within abdominal aorta, with subtotal occlusion of celiac artery- but no signs of acute mesenteric ischemia. Had colonic diverticulosis but not diverticulitis. She remains intubated and weaned off of vasopressors.   Past Medical History:  Diagnosis Date   Anxiety    Arthritis    osteoarthritis. spinal stenosis. Scoliosis of spine-degenerative spine.   Bilateral cataracts    CAD (coronary artery disease)    minimal, improved on right 06/2016   Chronic kidney disease    STAGE 4   DDD (degenerative disc disease), lumbar    Dyspnea    Elevated cholesterol    GERD (gastroesophageal reflux disease)    controls with Nexium   Grade I diastolic dysfunction 69/50/7225   Noted on ECHO   History of cardiomegaly    History of gallstones    Hypertension    LVH (left ventricular hypertrophy) 05/04/2017   Mil, noted on ECHO   Mild depression ([facility])    Pre-diabetes    RBBB (right bundle branch block)    Spinal  stenosis    Tubular adenoma    and benign polyps   Vitamin D deficiency    Wears partial dentures     Allergies:  Allergies  Allergen Reactions   Ace Inhibitors Cough   Hydrochlorothiazide     Low Sodium    MEDICATIONS:  arformoterol  15 mcg Nebulization BID   [START ON 09/24/2021] aspirin  81 mg Per Tube Daily   chlorhexidine gluconate (MEDLINE KIT)  15 mL Mouth Rinse BID   Chlorhexidine Gluconate Cloth  6 each Topical Daily   heparin  5,000 Units Subcutaneous Q8H   mouth rinse  15 mL Mouth Rinse 10 times per day   polyethylene glycol  17 g Per Tube Daily   revefenacin  175 mcg Nebulization Daily   sodium chloride flush  10-40 mL Intracatheter Q12H    Social History   Tobacco Use   Smoking status: Never   Smokeless tobacco: Never  Vaping Use   Vaping Use: Never used  Substance Use Topics   Alcohol use: Yes    Comment: OCC   Drug use: Never    Family History  Problem Relation Age of Onset   Heart disease Father    Heart failure Father    Diabetes Father    Atrial fibrillation Sister     Review of Systems - abd pain and "not emptying out" after BM per her daughters report. Remains intubated/sedated unable to get full ROS  OBJECTIVE: Temp:  [96.6 F (35.9 C)-98.3 F (36.8 C)] 98.3 F (36.8 C) (10/06 1151) Pulse Rate:  [53-80] 53 (10/06 1400) Resp:  [15-30] 21 (10/06 1400) BP: (84-135)/(33-65) 98/59 (10/06 1400) SpO2:  [91 %-99 %] 97 % (10/06 1536) Arterial Line BP: (102-155)/(41-62) 114/48 (10/06 1400) FiO2 (%):  [60 %-90 %] 60 % (10/06 1536) Weight:  [86.9 kg] 86.9 kg (10/06 0500) Physical Exam  Constitutional:  appears well-developed and well-nourished. No distress.  HENT: [place]/AT, PERRLA, no scleral icterus Mouth/Throat: OETT,  Chest wall = triple MacArthur lumen in place Cardiovascular: Normal rate, regular rhythm and normal heart sounds. Exam reveals no gallop and no friction rub.  No murmur heard.  Pulmonary/Chest: Effort normal and breath sounds  normal. No respiratory distress.  has no wheezes.  Neck = supple, no nuchal rigidity Abdominal: Soft. Bowel sounds are normal.  exhibits no distension. There is no tenderness.  Lymphadenopathy: no cervical adenopathy. No axillary adenopathy Skin: Skin is warm and dry. No rash noted. No erythema.   LABS: Results for orders placed or performed during the hospital encounter of 09/22/21 (from the past 48 hour(s))  Culture, blood (routine x 2)     Status: None (Preliminary result)   Collection Time: 09/22/21  8:10 AM   Specimen: BLOOD  Result Value Ref Range   Specimen Description BLOOD RIGHT ANTECUBITAL    Special Requests      BOTTLES DRAWN AEROBIC AND ANAEROBIC Blood Culture results may not be optimal due to an inadequate volume of blood received in culture bottles   Culture  Setup Time      [place] AND ANAEROBIC BOTTLES CRITICAL VALUE NOTED.  VALUE IS CONSISTENT WITH PREVIOUSLY REPORTED AND CALLED VALUE.    Culture      GRAM NEGATIVE RODS STREPTOCOCCUS INFANTARIUS CULTURE REINCUBATED FOR BETTER GROWTH Performed at [facility], [place] [address], [place], [place] [address]    Report Status PENDING   Comprehensive metabolic panel     Status: Abnormal   Collection Time: 09/22/21  8:11 AM  Result Value Ref Range   Sodium 131 (L) 135 - 145 mmol/L   Potassium 3.3 (L) 3.5 - 5.1 mmol/L   Chloride 98 98 - 111 mmol/L   CO2 17 (L) 22 - 32 mmol/L   Glucose, Bld 78 70 - 99 mg/dL    Comment: Glucose reference range applies only to samples taken after fasting for at least 8 hours.   BUN 20 8 - 23 mg/dL   Creatinine, Ser 1.49 (H) 0.44 - 1.00 mg/dL   Calcium 8.9 8.9 - 10.3 mg/dL   Total Protein 5.1 (L) 6.5 - 8.1 g/dL   Albumin 2.8 (L) 3.5 - 5.0 g/dL   AST 468 (H) 15 - 41 U/L   ALT 499 (H) 0 - 44 U/L   Alkaline Phosphatase 186 (H) 38 - 126 U/L   Total Bilirubin 2.3 (H) 0.3 - 1.2 mg/dL   GFR, Estimated 34 (L) >60 mL/min    Comment: (NOTE) Calculated using  the CKD-EPI Creatinine Equation (2021)    Anion gap 16 (H) 5 - 15    Comment: Performed at [facility], [place] [address], [place], [place] [address]  Troponin I (High Sensitivity)     Status: Abnormal   Collection Time: 09/22/21  8:11 AM  Result Value Ref Range   Troponin I (High Sensitivity) 24 (H) <18 ng/L    Comment: (NOTE) Elevated high sensitivity troponin I (hsTnI) values  and significant  changes across serial measurements may suggest ACS but many other  chronic and acute conditions are known to elevate hsTnI results.  Refer to the "Links" section for chest pain algorithms and additional  guidance. Performed at [facility], [place] [address], [place], [place] [address]   Lipase, blood     Status: Abnormal   Collection Time: 09/22/21  8:11 AM  Result Value Ref Range   Lipase 135 (H) 11 - 51 U/L    Comment: Performed at [facility], [place] [address], [place], [place] [address]  CBC with Differential     Status: Abnormal   Collection Time: 09/22/21  8:11 AM  Result Value Ref Range   WBC 33.8 (H) 4.0 - 10.5 K/uL   RBC 3.29 (L) 3.87 - 5.11 MIL/uL   Hemoglobin 9.6 (L) 12.0 - 15.0 g/dL   HCT 29.6 (L) 36.0 - 46.0 %   MCV 90.0 80.0 - 100.0 fL   MCH 29.2 26.0 - 34.0 pg   MCHC 32.4 30.0 - 36.0 g/dL   RDW 13.7 11.5 - 15.5 %   Platelets 243 150 - 400 K/uL   nRBC 0.0 0.0 - 0.2 %   Neutrophils Relative % 88 %   Neutro Abs 29.7 (H) 1.7 - 7.7 K/uL   Lymphocytes Relative 2 %   Lymphs Abs 0.6 (L) 0.7 - 4.0 K/uL   Monocytes Relative 8 %   Monocytes Absolute 2.7 (H) 0.1 - 1.0 K/uL   Eosinophils Relative 0 %   Eosinophils Absolute 0.0 0.0 - 0.5 K/uL   Basophils Relative 0 %   Basophils Absolute 0.1 0.0 - 0.1 K/uL   Immature Granulocytes 2 %   Abs Immature Granulocytes 0.65 (H) 0.00 - 0.07 K/uL    Comment: Performed at [facility] [address], [place], [place] [address]  Protime-INR     Status: Abnormal   Collection Time: 09/22/21  8:11 AM  Result Value Ref  Range   Prothrombin Time 16.4 (H) 11.4 - 15.2 seconds   INR 1.3 (H) 0.8 - 1.2    Comment: (NOTE) INR goal varies based on device and disease states. Performed at [facility], [place] [address], [place], [place] [address]   Resp Panel by RT-PCR (Flu A&B, Covid) Nasopharyngeal Swab     Status: None   Collection Time: 09/22/21  8:13 AM   Specimen: Nasopharyngeal Swab; Nasopharyngeal(NP) swabs in vial transport medium  Result Value Ref Range   SARS Coronavirus 2 by RT PCR NEGATIVE NEGATIVE    Comment: (NOTE) SARS-CoV-2 target nucleic acids are NOT DETECTED.  The SARS-CoV-2 RNA is generally detectable in upper respiratory specimens during the acute phase of infection. The lowest concentration of SARS-CoV-2 viral copies this assay can detect is 138 copies/mL. A negative result does not preclude SARS-Cov-2 infection and should not be used as the sole basis for treatment or other patient management decisions. A negative result may occur with  improper specimen collection/handling, submission of specimen other than nasopharyngeal swab, presence of viral mutation(s) within the areas targeted by this assay, and inadequate number of viral copies(<138 copies/mL). A negative result must be combined with clinical observations, patient history, and epidemiological information. The expected result is Negative.  Fact Sheet for Patients:  EntrepreneurPulse.com.au  Fact Sheet for Healthcare Providers:  IncredibleEmployment.be  This test is no t yet approved or cleared by the [place] FDA and  has been authorized for detection and/or diagnosis of SARS-CoV-2  by FDA under an Emergency Use Authorization (EUA). This EUA will remain  in effect (meaning this test can be used) for the duration of the COVID-19 declaration under Section 564(b)(1) of the Act, 21 U.S.C.section 360bbb-3(b)(1), unless the authorization is terminated  or revoked sooner.        Influenza A by PCR NEGATIVE NEGATIVE   Influenza B by PCR NEGATIVE NEGATIVE    Comment: (NOTE) The Xpert Xpress SARS-CoV-2/FLU/RSV plus assay is intended as an aid in the diagnosis of influenza from Nasopharyngeal swab specimens and should not be used as a sole basis for treatment. Nasal washings and aspirates are unacceptable for Xpert Xpress SARS-CoV-2/FLU/RSV testing.  Fact Sheet for Patients: EntrepreneurPulse.com.au  Fact Sheet for Healthcare Providers: IncredibleEmployment.be  This test is not yet approved or cleared by the [place] FDA and has been authorized for detection and/or diagnosis of SARS-CoV-2 by FDA under an Emergency Use Authorization (EUA). This EUA will remain in effect (meaning this test can be used) for the duration of the COVID-19 declaration under Section 564(b)(1) of the Act, 21 U.S.C. section 360bbb-3(b)(1), unless the authorization is terminated or revoked.  Performed at [facility], [place] [address], [place], [place] [address]   Culture, blood (routine x 2)     Status: Abnormal (Preliminary result)   Collection Time: 09/22/21  8:15 AM   Specimen: BLOOD LEFT FOREARM  Result Value Ref Range   Specimen Description BLOOD LEFT FOREARM    Special Requests      BOTTLES DRAWN AEROBIC AND ANAEROBIC Blood Culture results may not be optimal due to an inadequate volume of blood received in culture bottles   Culture  Setup Time      GRAM POSITIVE COCCI GRAM NEGATIVE RODS IN BOTH AEROBIC AND ANAEROBIC BOTTLES CRITICAL RESULT CALLED TO, READ BACK BY AND VERIFIED WITH: PHARMD GREG ABBOTT 09/23/2021'@00' :07 BY TW    Culture (A)     ESCHERICHIA COLI STREPTOCOCCUS INFANTARIUS CULTURE REINCUBATED FOR BETTER GROWTH Performed at [facility], [place] [address], [place],  [address]    Report Status PENDING   Type and screen [place]     Status: None   Collection Time: 09/22/21  8:15 AM   Result Value Ref Range   ABO/RH(D) O NEG    Antibody Screen NEG    Sample Expiration      09/25/2021,2359 Performed at [facility], [place] [address], [place],  [address]   Blood Culture ID Panel (Reflexed)     Status: Abnormal   Collection Time: 09/22/21  8:15 AM  Result Value Ref Range   Enterococcus faecalis NOT DETECTED NOT DETECTED   Enterococcus Faecium NOT DETECTED NOT DETECTED   Listeria monocytogenes NOT DETECTED NOT DETECTED   Staphylococcus species NOT DETECTED NOT DETECTED   Staphylococcus aureus (BCID) NOT DETECTED NOT DETECTED   Staphylococcus epidermidis NOT DETECTED NOT DETECTED   Staphylococcus lugdunensis NOT DETECTED NOT DETECTED   Streptococcus species DETECTED (A) NOT DETECTED    Comment: Not Enterococcus species, Streptococcus agalactiae, Streptococcus pyogenes, or Streptococcus pneumoniae. CRITICAL RESULT CALLED TO, READ BACK BY AND VERIFIED WITH: PHARMD GREG ABBOTT 09/23/2021'@00' :07 BY TW    Streptococcus agalactiae NOT DETECTED NOT DETECTED   Streptococcus pneumoniae NOT DETECTED NOT DETECTED   Streptococcus pyogenes NOT DETECTED NOT DETECTED   A.calcoaceticus-baumannii NOT DETECTED NOT DETECTED   Bacteroides fragilis NOT DETECTED NOT DETECTED   Enterobacterales DETECTED (A) NOT DETECTED    Comment: Enterobacterales represent a large order  of gram negative bacteria, not a single organism. CRITICAL RESULT CALLED TO, READ BACK BY AND VERIFIED WITH: PHARMD GREG ABBOTT 09/23/2021'@00' :07 BY TW    Enterobacter cloacae complex NOT DETECTED NOT DETECTED   Escherichia coli DETECTED (A) NOT DETECTED    Comment: CRITICAL RESULT CALLED TO, READ BACK BY AND VERIFIED WITH: PHARMD GREG ABBOTT 09/23/2021'@00' :07 BY TW    Klebsiella aerogenes NOT DETECTED NOT DETECTED   Klebsiella oxytoca NOT DETECTED NOT DETECTED   Klebsiella pneumoniae NOT DETECTED NOT DETECTED   Proteus species NOT DETECTED NOT DETECTED   Salmonella species NOT DETECTED NOT DETECTED    Serratia marcescens NOT DETECTED NOT DETECTED   Haemophilus influenzae NOT DETECTED NOT DETECTED   Neisseria meningitidis NOT DETECTED NOT DETECTED   Pseudomonas aeruginosa NOT DETECTED NOT DETECTED   Stenotrophomonas maltophilia NOT DETECTED NOT DETECTED   Candida albicans NOT DETECTED NOT DETECTED   Candida auris NOT DETECTED NOT DETECTED   Candida glabrata NOT DETECTED NOT DETECTED   Candida krusei NOT DETECTED NOT DETECTED   Candida parapsilosis NOT DETECTED NOT DETECTED   Candida tropicalis NOT DETECTED NOT DETECTED   Cryptococcus neoformans/gattii NOT DETECTED NOT DETECTED   CTX-M ESBL NOT DETECTED NOT DETECTED   Carbapenem resistance IMP NOT DETECTED NOT DETECTED   Carbapenem resistance KPC NOT DETECTED NOT DETECTED   Carbapenem resistance NDM NOT DETECTED NOT DETECTED   Carbapenem resist OXA 48 LIKE NOT DETECTED NOT DETECTED   Carbapenem resistance VIM NOT DETECTED NOT DETECTED    Comment: Performed at [facility], [address] [place], [place] [address]  I-stat chem 8, ED (not at [facility] or [facility] [place])     Status: Abnormal   Collection Time: 09/22/21  8:31 AM  Result Value Ref Range   Sodium 131 (L) 135 - 145 mmol/L   Potassium 3.2 (L) 3.5 - 5.1 mmol/L   Chloride 97 (L) 98 - 111 mmol/L   BUN 20 8 - 23 mg/dL   Creatinine, Ser 1.20 (H) 0.44 - 1.00 mg/dL   Glucose, Bld 80 70 - 99 mg/dL    Comment: Glucose reference range applies only to samples taken after fasting for at least 8 hours.   Calcium, Ion 1.11 (L) 1.15 - 1.40 mmol/L   TCO2 19 (L) 22 - 32 mmol/L   Hemoglobin 10.2 (L) 12.0 - 15.0 g/dL   HCT 30.0 (L) 36.0 - 46.0 %  Lactic acid, plasma     Status: Abnormal   Collection Time: 09/22/21  8:58 AM  Result Value Ref Range   Lactic Acid, Venous 5.1 (HH) 0.5 - 1.9 mmol/L    Comment: CRITICAL RESULT CALLED TO, READ BACK BY AND VERIFIED WITH: Levan Hurst, [place] 09/22/21 L. KLAR Performed at [facility], [place] [address] [place], [place] [address]   POC occult blood, ED      Status: None   Collection Time: 09/22/21  9:13 AM  Result Value Ref Range   Fecal Occult Bld NEGATIVE NEGATIVE  Urinalysis, Routine w reflex microscopic     Status: Abnormal   Collection Time: 09/22/21  9:23 AM  Result Value Ref Range   Color, Urine AMBER (A) YELLOW    Comment: BIOCHEMICALS MAY BE AFFECTED BY COLOR   APPearance CLEAR CLEAR   Specific Gravity, Urine 1.035 (H) 1.005 - 1.030   pH 6.0 5.0 - 8.0   Glucose, UA NEGATIVE NEGATIVE mg/dL   Hgb urine dipstick NEGATIVE NEGATIVE   Bilirubin Urine NEGATIVE NEGATIVE   Ketones, ur  NEGATIVE NEGATIVE mg/dL   Protein, ur 30 (A) NEGATIVE mg/dL   Nitrite NEGATIVE NEGATIVE   Leukocytes,Ua NEGATIVE NEGATIVE   RBC / HPF 0-5 0 - 5 RBC/hpf   WBC, UA 6-10 0 - 5 WBC/hpf   Bacteria, UA NONE SEEN NONE SEEN   Squamous Epithelial / LPF 0-5 0 - 5   Hyaline Casts, UA PRESENT    Cellular Cast, UA 4    Non Squamous Epithelial 0-5 (A) NONE SEEN    Comment: Performed at [facility], [place] [address], [place], [place] [address]  MRSA Next Gen by PCR, Nasal     Status: None   Collection Time: 09/22/21 11:46 AM   Specimen: Nasal Mucosa; Nasal Swab  Result Value Ref Range   MRSA by PCR Next Gen NOT DETECTED NOT DETECTED    Comment: (NOTE) The GeneXpert MRSA Assay (FDA approved for NASAL specimens only), is one component of a comprehensive MRSA colonization surveillance program. It is not intended to diagnose MRSA infection nor to guide or monitor treatment for MRSA infections. Test performance is not FDA approved in patients less than 37 years old. Performed at [facility], [place] [address] [place], [place] [address]   Lactic acid, plasma     Status: Abnormal   Collection Time: 09/22/21 12:03 PM  Result Value Ref Range   Lactic Acid, Venous 4.0 (HH) 0.5 - 1.9 mmol/L    Comment: CRITICAL VALUE NOTED.  VALUE IS CONSISTENT WITH PREVIOUSLY REPORTED AND CALLED VALUE. Performed at [facility], [place] [address], [place], [place]  [address]   Troponin I (High Sensitivity)     Status: Abnormal   Collection Time: 09/22/21 12:03 PM  Result Value Ref Range   Troponin I (High Sensitivity) 24 (H) <18 ng/L    Comment: (NOTE) Elevated high sensitivity troponin I (hsTnI) values and significant  changes across serial measurements may suggest ACS but many other  chronic and acute conditions are known to elevate hsTnI results.  Refer to the "Links" section for chest pain algorithms and additional  guidance. Performed at [facility], [place] [address] [place], [place] [address]   Glucose, capillary     Status: None   Collection Time: 09/22/21 12:16 PM  Result Value Ref Range   Glucose-Capillary 96 70 - 99 mg/dL    Comment: Glucose reference range applies only to samples taken after fasting for at least 8 hours.  I-STAT 7, (LYTES, BLD GAS, ICA, H+H)     Status: Abnormal   Collection Time: 09/22/21  1:59 PM  Result Value Ref Range   pH, Arterial 7.281 (L) 7.350 - 7.450   pCO2 arterial 50.3 (H) 32.0 - 48.0 mmHg   pO2, Arterial 111 (H) 83.0 - 108.0 mmHg   Bicarbonate 23.7 20.0 - 28.0 mmol/L   TCO2 25 22 - 32 mmol/L   O2 Saturation 98.0 %   Acid-base deficit 3.0 (H) 0.0 - 2.0 mmol/L   Sodium 133 (L) 135 - 145 mmol/L   Potassium 3.1 (L) 3.5 - 5.1 mmol/L   Calcium, Ion 1.35 1.15 - 1.40 mmol/L   HCT 32.0 (L) 36.0 - 46.0 %   Hemoglobin 10.9 (L) 12.0 - 15.0 g/dL   Collection site art line    Drawn by Operator    Sample type ARTERIAL   Basic metabolic panel     Status: Abnormal   Collection Time: 09/22/21  4:23 PM  Result Value Ref Range   Sodium 130 (L)  135 - 145 mmol/L   Potassium 4.4 3.5 - 5.1 mmol/L    Comment: NO VISIBLE HEMOLYSIS   Chloride 100 98 - 111 mmol/L   CO2 19 (L) 22 - 32 mmol/L   Glucose, Bld 107 (H) 70 - 99 mg/dL    Comment: Glucose reference range applies only to samples taken after fasting for at least 8 hours.   BUN 17 8 - 23 mg/dL   Creatinine, Ser 1.11 (H) 0.44 - 1.00 mg/dL   Calcium 8.8 (L)  8.9 - 10.3 mg/dL   GFR, Estimated 48 (L) >60 mL/min    Comment: (NOTE) Calculated using the CKD-EPI Creatinine Equation (2021)    Anion gap 11 5 - 15    Comment: Performed at [facility] [address], [place], [place] [address]  Lactic acid, plasma     Status: Abnormal   Collection Time: 09/22/21  4:24 PM  Result Value Ref Range   Lactic Acid, Venous 4.1 (HH) 0.5 - 1.9 mmol/L    Comment: CRITICAL VALUE NOTED.  VALUE IS CONSISTENT WITH PREVIOUSLY REPORTED AND CALLED VALUE. Performed at [facility], [place] [address], [place], [place] [address]   Vancomycin, random     Status: None   Collection Time: 09/22/21  5:00 PM  Result Value Ref Range   Vancomycin Rm <4     Comment: RESULTS CONFIRMED BY MANUAL DILUTION Performed at [facility], [place] [address] [place], [place] [address]   Glucose, capillary     Status: None   Collection Time: 09/22/21  5:15 PM  Result Value Ref Range   Glucose-Capillary 94 70 - 99 mg/dL    Comment: Glucose reference range applies only to samples taken after fasting for at least 8 hours.  Lactic acid, plasma     Status: Abnormal   Collection Time: 09/22/21  7:55 PM  Result Value Ref Range   Lactic Acid, Venous 3.9 (HH) 0.5 - 1.9 mmol/L    Comment: CRITICAL VALUE NOTED.  VALUE IS CONSISTENT WITH PREVIOUSLY REPORTED AND CALLED VALUE. Performed at [facility], [place] [address] [place] [place], [place] [address]   Blood gas, arterial     Status: Abnormal   Collection Time: 09/22/21 11:29 PM  Result Value Ref Range   FIO2 90.00    pH, Arterial 7.389 7.350 - 7.450   pCO2 arterial 30.3 (L) 32.0 - 48.0 mmHg   pO2, Arterial 88.2 83.0 - 108.0 mmHg   Bicarbonate 18.0 (L) 20.0 - 28.0 mmol/L   Acid-base deficit 6.1 (H) 0.0 - 2.0 mmol/L   O2 Saturation 96.8 %   Patient temperature 36.8    Collection site A-LINE    Drawn by ITEDAL, RT    Sample type ARTERIAL    Allens test (pass/fail) A-LINE (A) PASS    Comment: Performed at [facility], [place] [address], [place], [place] [address]  Glucose, capillary     Status: Abnormal   Collection Time: 09/23/21  3:34 AM  Result Value Ref Range   Glucose-Capillary 113 (H) 70 - 99 mg/dL    Comment: Glucose reference range applies only to samples taken after fasting for at least 8 hours.  CBC     Status: Abnormal   Collection Time: 09/23/21  3:43 AM  Result Value Ref Range   WBC 46.3 (H) 4.0 - 10.5 K/uL   RBC 3.51 (L) 3.87 - 5.11 MIL/uL   Hemoglobin 10.3 (L) 12.0 - 15.0 g/dL   HCT  30.0 (L) 36.0 - 46.0 %   MCV 85.5 80.0 - 100.0 fL   MCH 29.3 26.0 - 34.0 pg   MCHC 34.3 30.0 - 36.0 g/dL   RDW 14.2 11.5 - 15.5 %   Platelets 223 150 - 400 K/uL   nRBC 0.0 0.0 - 0.2 %    Comment: Performed at [facility], [place] [address], [place], [place] [address]  Basic metabolic panel     Status: Abnormal   Collection Time: 09/23/21  3:43 AM  Result Value Ref Range   Sodium 131 (L) 135 - 145 mmol/L   Potassium 4.3 3.5 - 5.1 mmol/L   Chloride 101 98 - 111 mmol/L   CO2 19 (L) 22 - 32 mmol/L   Glucose, Bld 116 (H) 70 - 99 mg/dL    Comment: Glucose reference range applies only to samples taken after fasting for at least 8 hours.   BUN 20 8 - 23 mg/dL   Creatinine, Ser 1.11 (H) 0.44 - 1.00 mg/dL   Calcium 8.4 (L) 8.9 - 10.3 mg/dL   GFR, Estimated 48 (L) >60 mL/min    Comment: (NOTE) Calculated using the CKD-EPI Creatinine Equation (2021)    Anion gap 11 5 - 15    Comment: Performed at [facility] [address] [place], [place] [address]  Magnesium     Status: Abnormal   Collection Time: 09/23/21  3:43 AM  Result Value Ref Range   Magnesium 1.2 (L) 1.7 - 2.4 mg/dL    Comment: Performed at [facility] [address], [place], [place] [address]  Phosphorus     Status: None   Collection Time: 09/23/21  3:43 AM  Result Value Ref Range   Phosphorus 4.4 2.5 - 4.6 mg/dL    Comment: Performed at [facility] [address], [place], [place] [address]  Hepatic function  panel     Status: Abnormal   Collection Time: 09/23/21  3:43 AM  Result Value Ref Range   Total Protein 5.1 (L) 6.5 - 8.1 g/dL   Albumin 2.4 (L) 3.5 - 5.0 g/dL   AST 240 (H) 15 - 41 U/L   ALT 370 (H) 0 - 44 U/L   Alkaline Phosphatase 153 (H) 38 - 126 U/L   Total Bilirubin 1.4 (H) 0.3 - 1.2 mg/dL   Bilirubin, Direct 0.7 (H) 0.0 - 0.2 mg/dL   Indirect Bilirubin 0.7 0.3 - 0.9 mg/dL    Comment: Performed at [facility] [address], [place], [place] [address]  Glucose, capillary     Status: None   Collection Time: 09/23/21  8:10 AM  Result Value Ref Range   Glucose-Capillary 97 70 - 99 mg/dL    Comment: Glucose reference range applies only to samples taken after fasting for at least 8 hours.  Lactic acid, plasma     Status: Abnormal   Collection Time: 09/23/21  9:05 AM  Result Value Ref Range   Lactic Acid, Venous 2.3 (HH) 0.5 - 1.9 mmol/L    Comment: CRITICAL VALUE NOTED.  VALUE IS CONSISTENT WITH PREVIOUSLY REPORTED AND CALLED VALUE. Performed at [facility], [place] [address], [place], [place] [address]   Magnesium     Status: Abnormal   Collection Time: 09/23/21  9:05 AM  Result Value Ref Range   Magnesium 2.5 (H) 1.7 - 2.4 mg/dL    Comment: Performed at [facility] [address], [place], [place]  [address]  Lactic acid, plasma     Status: Abnormal   Collection Time: 09/23/21 11:26 AM  Result Value Ref Range   Lactic Acid, Venous 2.1 (HH) 0.5 - 1.9 mmol/L    Comment: CRITICAL VALUE NOTED.  VALUE IS CONSISTENT WITH PREVIOUSLY REPORTED AND CALLED VALUE. Performed at [facility], [place] [address], [place], [place] [address]   Magnesium     Status: Abnormal   Collection Time: 09/23/21 11:30 AM  Result Value Ref Range   Magnesium 2.7 (H) 1.7 - 2.4 mg/dL    Comment: Performed at [facility] [address], [place], [place] [address]  Glucose, capillary     Status: Abnormal   Collection Time: 09/23/21 11:42 AM  Result Value Ref Range    Glucose-Capillary 125 (H) 70 - 99 mg/dL    Comment: Glucose reference range applies only to samples taken after fasting for at least 8 hours.   Comment 1 Notify RN   Glucose, capillary     Status: Abnormal   Collection Time: 09/23/21  3:42 PM  Result Value Ref Range   Glucose-Capillary 122 (H) 70 - 99 mg/dL    Comment: Glucose reference range applies only to samples taken after fasting for at least 8 hours.    MICRO:  IMAGING: DG Chest 1 View  Result Date: 09/22/2021 CLINICAL DATA:  Endotracheal tube placement EXAM: CHEST  1 VIEW COMPARISON:  Radiograph 09/22/2021 FINDINGS: Endotracheal tube extends into the proximal RIGHT mainstem bronchus. Recommend retraction by approximately 4 cm to 5 cm. There is new atelectasis versus infiltrate in the RIGHT lower lobe. Mild pulmonary edema interstitial edema pattern. RIGHT central venous line with tip in the distal RIGHT subclavian vein. IMPRESSION: 1. Endotracheal tube is low.  Recommend retraction by 4-5 cm. 2. Central venous line with tip in the distal RIGHT subclavian vein 3. No pneumothorax. 4. Increased RIGHT basilar atelectasis. These results will be called to the ordering clinician or representative by the Radiologist Assistant, and communication documented in the PACS or [employer]. Electronically Signed   By: Suzy Bouchard M.D.   On: 09/22/2021 13:59   DG Chest Port 1 View  Result Date: 09/23/2021 CLINICAL DATA:  Respiratory failure. EXAM: PORTABLE CHEST 1 VIEW COMPARISON:  09/22/2021 FINDINGS: The endotracheal tube is 17 mm above the carina. The NG tube is coursing down the esophagus and into the stomach. The right subclavian line has an unusual course. May be looped and a branch of the right subclavian vein. The tip is in the subclavian vein near its junction with the right jugular vein. Stable cardiac enlargement and tortuous calcified thoracic aorta. Persistent perihilar predominant interstitial and airspace process, likely pulmonary  edema. Small bilateral pleural effusions. IMPRESSION: 1. Support apparatus as above. 2. Persistent cardiac enlargement, pulmonary edema and pleural effusions. Electronically Signed   By: Marijo Sanes M.D.   On: 09/23/2021 09:35   DG CHEST PORT 1 VIEW  Result Date: 09/22/2021 CLINICAL DATA:  Intubation EXAM: PORTABLE CHEST 1 VIEW COMPARISON:  Portable exam 1409 hours compared to 1321 hours FINDINGS: Tip of endotracheal tube is within origin of the RIGHT mainstem bronchus; recommend withdrawal 3 cm. Tip of RIGHT subclavian central venous catheter projects over distal RIGHT jugular vein, stable. Enlargement of cardiac silhouette with pulmonary vascular congestion. Scattered interstitial infiltrates and small bibasilar effusions. No pneumothorax. IMPRESSION: Recommend withdrawal of endotracheal tube 3 cm to place tip in trachea above carina. Persistent pulmonary edema. Tip of RIGHT subclavian line projects over  distal RIGHT jugular vein. Findings called to [place] on [facility] on 09/21/2021 at 1435 hrs. Electronically Signed   By: Lavonia Dana M.D.   On: 09/22/2021 14:37   DG Chest Port 1 View  Result Date: 09/22/2021 CLINICAL DATA:  Pt brought in via EMS with c/c of epigastric abd pain radiating to back. Abd pain started 4 days ago but was worsen last night and constant. Family called EMS due to constant pain and radiating to shoulderPt having difficulty breathing with oxygen sats in the 80s EXAM: PORTABLE CHEST 1 VIEW COMPARISON:  06/19/2021 and older exams. FINDINGS: Cardiac silhouette is mildly enlarged. No mediastinal or hilar masses. Stable chronic prominence of the interstitial markings. Lungs otherwise clear. No convincing pleural effusion and no pneumothorax. Skeletal structures are demineralized, grossly intact. IMPRESSION: No acute cardiopulmonary disease. Electronically Signed   By: Lajean Manes M.D.   On: 09/22/2021 08:48   CT Angio Chest/Abd/Pel for Dissection W and/or W/WO  Result Date:  09/22/2021 CLINICAL DATA:  Abdominal pain.  Concern for aortic dissection. EXAM: CT ANGIOGRAPHY CHEST, ABDOMEN AND PELVIS TECHNIQUE: Non-contrast CT of the chest was initially obtained. Multidetector CT imaging through the chest, abdomen and pelvis was performed using the standard protocol during bolus administration of intravenous contrast. Multiplanar reconstructed images and MIPs were obtained and reviewed to evaluate the vascular anatomy. CONTRAST:  6m OMNIPAQUE IOHEXOL 350 MG/ML SOLN COMPARISON:  None. FINDINGS: CTA CHEST FINDINGS Vascular Findings: Review of the precontrast images is negative for the presence of an intramural hematoma. Moderate amount of atherosclerotic plaque within a normal caliber thoracic aorta, not resulting in hemodynamically significant stenosis. No evidence of thoracic aortic dissection or periaortic stranding on this non gated examination. Borderline cardiomegaly. Coronary artery calcifications. No pericardial effusion though there is a trace amount of fluid seen with the pericardial recess. Although this examination was not tailored for the evaluation the pulmonary arteries, there are no discrete filling defects within the central pulmonary arterial tree to suggest central pulmonary embolism. Small amount of air is seen within the root of the main pulmonary artery, likely the sequela of peripheral IV insertion. ------------------------------------------------------------- Thoracic aortic measurements: SINOTUBULAR JUNCTION: 27 mm as measured in greatest oblique short axis coronal dimension. PROXIMAL ASCENDING THORACIC AORTA: 30 mm as measured in greatest oblique short axis axial dimension at the level of the main pulmonary artery. AORTIC ARCH: 26 mm as measured in greatest oblique short axis sagittal dimension. PROXIMAL DESCENDING THORACIC AORTA: 24 mm as measured in greatest oblique short axis axial dimension at the level of the main pulmonary artery. DISTAL DESCENDING THORACIC  AORTA: 24 mm as measured in greatest oblique short axis axial dimension at the level of the diaphragmatic hiatus. Review of the MIP images confirms the above findings. ------------------------------------------------------------- Non-Vascular Findings: Mediastinum/Lymph Nodes: No bulky mediastinal, hilar or axillary lymphadenopathy. Lungs/Pleura: Minimal dependent subpleural ground-glass and consolidative opacities favored to represent atelectasis. No discrete air bronchograms though there are nodular areas of persistent aeration within the consolidative opacities within the bilateral lung bases. No pleural effusion or pneumothorax. The central pulmonary airways appear patent. No discrete pulmonary nodules given respiratory artifact. Musculoskeletal: No acute or aggressive osseous abnormalities. Moderate to severe degenerative change of the bilateral glenohumeral joints, right greater than left, incompletely evaluated. Stigmata of dish throughout the thoracic spine. Moderate DDD within the imaged lower cervical spine. Regional soft tissues appear normal. Normal appearance of the thyroid gland. _________________________________________________________ _________________________________________________________ CTA ABDOMEN AND PELVIS FINDINGS VASCULAR Aorta: There is a moderate to large amount  of predominantly calcified atherosclerotic plaque throughout the abdominal aorta, particularly within the superior most aspect of the abdominal aorta at the level of the diaphragmatic hiatus (image 91, series 100), though not resulting in hemodynamically significant stenosis. No evidence of abdominal aortic dissection or periaortic stranding. Celiac: There is a large amount of calcified atherosclerotic plaque involving the origin of the celiac artery with subtotal occlusion involving the proximal 1 point 3 cm of the vessel (axial image 93, series 100; sagittal image 114, series 13). There is collateral supply from the SMA via the  GDA and a hypertrophied network of pancreaticoduodenal arteries. SMA: Minimal amount of eccentric predominantly calcified atherosclerotic plaque involving the origin and proximal aspect of the SMA, not resulting in hemodynamically significant stenosis. As above, the SMA provides robust collateral supply to the celiac artery via the GDA and hypertrophied network of pancreaticoduodenal arteries Renals: The right renal artery is duplicated, nearly co-dominant. Solitary left renal artery. There is a large amount of eccentric mixed calcified and noncalcified atherosclerotic plaque involving the origin of the left renal artery resulting in short segment at least 50% luminal narrowing (coronal image 103, series 12). The right renal arteries appear patent without a hemodynamically significant narrowing. No vessel irregularity to suggest FMD. IMA: Diseased at its origin though remains patent, also with collateral supply from the SMA. Inflow: There is a minimal amount of eccentric mixed calcified and noncalcified atherosclerotic plaque involving the bilateral common iliac arteries, not resulting in a hemodynamically significant stenosis. The bilateral internal iliac arteries are diseased though patent of normal caliber. The bilateral external iliac arteries are of normal caliber and widely patent without a hemodynamically significant narrowing. There is a very minimal amount of atherosclerotic plaque involving the bilateral common and imaged portions of the bilateral deep and superficial femoral arteries, not resulting in a hemodynamically significant stenosis. Veins: The IVC and pelvic venous systems appear patent on this arterial phase examination Review of the MIP images confirms the above findings. _________________________________________________________ NON-VASCULAR Hepatobiliary: Normal hepatic contour. No discrete hyperenhancing hepatic lesions. Post cholecystectomy. Common bile duct is mildly dilated measuring 1.2 cm  in diameter, likely secondary to patient's age and post cholecystectomy state. No intrahepatic biliary dilatation. No ascites. Pancreas: Normal early arterial phase appearance of the pancreas. Spleen: The spleen is somewhat atrophic but otherwise normal appearance. Adrenals/Urinary Tract: There is symmetric enhancement of the bilateral kidneys. No evidence of nephrolithiasis. No urinary obstruction or perinephric stranding. Normal appearance of the bilateral adrenal glands. Normal appearance of the urinary bladder given degree of distention. Stomach/Bowel: Scattered colonic diverticulosis without evidence of superimposed acute diverticulitis. Moderate colonic stool burden without evidence of enteric obstruction. Normal appearance of the terminal ileum and the retrocecal appendix. High density ingested radiopaque debris is seen within the gastric fundus. Small hiatal hernia. No discrete areas of bowel wall thickening. Note is made of a small diverticulum involving the horizontal segment of the duodenum. Lymphatic: No bulky retroperitoneal, porta hepatis, mesenteric, pelvic or inguinal lymphadenopathy. Reproductive: Normal appearance of the pelvic organs for age. No discrete adnexal lesion. No free fluid the pelvic cul-de-sac. Other: Subcutaneous edema about the midline of the low back. Small bilateral mesenteric fat containing inguinal hernias. Musculoskeletal: No acute or aggressive osseous abnormalities. Moderate severe multilevel lumbar spine DDD. Mild presumably degenerative scoliotic curvature of the thoracolumbar spine. Post left total hip replacement without evidence of hardware failure or loosening within the imaged components. Mild-to-moderate degenerative change of the right hip with joint space loss, subchondral sclerosis and osteophytosis. Review  of the MIP images confirms the above findings. IMPRESSION: Chest CTA impression: 1. No acute cardiopulmonary disease. Specifically, no evidence of thoracic  aortic aneurysm, dissection or central pulmonary embolism. 2. Coronary artery calcifications. Abdomen and pelvic CTA impression: 1. No acute findings within the abdomen or pelvis. 2. Moderate to large amount of atherosclerotic plaque within a normal caliber abdominal aorta, not resulting in a hemodynamically significant stenosis. Aortic Atherosclerosis (ICD10-I70.0). 3. 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 The IMA is diseased at its origin though remains patent, though also with collateral supply from the SMA. Presently, there is no evidence of acute mesenteric ischemia, specifically, no discrete areas of bowel wall thickening or evidence of pneumatosis or portal venous gas, however constellation of above findings could be seen in the setting of chronic mesenteric ischemia. Clinical correlation is advised. 4. Suspected hemodynamically significant narrowing involving the origin of the left renal artery without associated delayed renal enhancement or asymmetric renal atrophy. 5. Colonic diverticulosis without evidence superimposed acute diverticulitis. 6. Post cholecystectomy. Electronically Signed   By: Sandi Mariscal M.D.   On: 09/22/2021 09:43   US Abdomen Limited RUQ (LIVER/GB)  Result Date: 09/23/2021 CLINICAL DATA:  Elevated liver enzymes EXAM: ULTRASOUND ABDOMEN LIMITED RIGHT UPPER QUADRANT COMPARISON:  None. FINDINGS: Gallbladder: Prior cholecystectomy. Common bile duct: Diameter: 6-9 mm Liver: Mildly coarsened liver echotexture with mildly nodular left hepatic lobe contours. Portal vein is patent on color Doppler imaging with normal direction of blood flow towards the liver. Other: Trace ascites.  Right pleural effusion. IMPRESSION: Mildly coarsened liver echotexture with mildly nodular left hepatic lobe contours, which can be seen in chronic liver disease. Common bile duct measures 6-9 mm, which can be normal after  cholecystectomy. Correlate with bilirubin levels. Trace ascites.  Right pleural effusion. Electronically Signed   By: Maurine Simmering M.D.   On: 09/23/2021 10:00    Assessment/Plan:  87yo F with severe sepsis with respiratory distress s/p intubated due to polymicrobial bacteremia thought to be GI source. Strep infantarius is part of strep bovis group -- association with colon cancer  PLUS also identified e.coli  -recommend to narrow abtx to ceftriaxone 2gm IV daily plus metronidazole - will repeat blood cx tomorrow to see she is clearing bacteremia - recommend TTE  Transaminitis = likely shock liver from prolonged hypoperfusion. Continue to monitor and anticipate to improve  Respiratory distress s/p intubation = manage by pulm cc team to wean over next few days if no worsening pulmonary issues.

## 2021-09-24 ENCOUNTER — Inpatient Hospital Stay (HOSPITAL_COMMUNITY): Payer: Medicare Other

## 2021-09-24 ENCOUNTER — Encounter (HOSPITAL_COMMUNITY): Payer: Self-pay | Admitting: Pulmonary Disease

## 2021-09-24 DIAGNOSIS — R7881 Bacteremia: Secondary | ICD-10-CM | POA: Diagnosis not present

## 2021-09-24 DIAGNOSIS — A419 Sepsis, unspecified organism: Secondary | ICD-10-CM | POA: Diagnosis not present

## 2021-09-24 DIAGNOSIS — N179 Acute kidney failure, unspecified: Secondary | ICD-10-CM | POA: Diagnosis not present

## 2021-09-24 DIAGNOSIS — A498 Other bacterial infections of unspecified site: Secondary | ICD-10-CM | POA: Diagnosis not present

## 2021-09-24 DIAGNOSIS — J9601 Acute respiratory failure with hypoxia: Secondary | ICD-10-CM | POA: Diagnosis not present

## 2021-09-24 DIAGNOSIS — R7401 Elevation of levels of liver transaminase levels: Secondary | ICD-10-CM | POA: Diagnosis not present

## 2021-09-24 LAB — BASIC METABOLIC PANEL
Anion gap: 9 (ref 5–15)
Anion gap: 7 (ref 5–15)
BUN: 34 mg/dL — ABNORMAL HIGH (ref 8–23)
BUN: 32 mg/dL — ABNORMAL HIGH (ref 8–23)
CO2: 21 mmol/L — ABNORMAL LOW (ref 22–32)
CO2: 25 mmol/L (ref 22–32)
Calcium: 8.5 mg/dL — ABNORMAL LOW (ref 8.9–10.3)
Calcium: 8.5 mg/dL — ABNORMAL LOW (ref 8.9–10.3)
Chloride: 101 mmol/L (ref 98–111)
Chloride: 101 mmol/L (ref 98–111)
Creatinine, Ser: 1.02 mg/dL — ABNORMAL HIGH (ref 0.44–1.00)
Creatinine, Ser: 1.01 mg/dL — ABNORMAL HIGH (ref 0.44–1.00)
GFR, Estimated: 54 mL/min — ABNORMAL LOW (ref >60)
GFR, Estimated: 54 mL/min — ABNORMAL LOW (ref >60)
Glucose, Bld: 113 mg/dL — ABNORMAL HIGH (ref 70–99)
Glucose, Bld: 119 mg/dL — ABNORMAL HIGH (ref 70–99)
Potassium: 3.2 mmol/L — ABNORMAL LOW (ref 3.5–5.1)
Potassium: 3.7 mmol/L (ref 3.5–5.1)
Sodium: 131 mmol/L — ABNORMAL LOW (ref 135–145)
Sodium: 133 mmol/L — ABNORMAL LOW (ref 135–145)

## 2021-09-24 LAB — CBC
HCT: 28.9 % — ABNORMAL LOW (ref 36.0–46.0)
Hemoglobin: 9.8 g/dL — ABNORMAL LOW (ref 12.0–15.0)
MCH: 28.7 pg (ref 26.0–34.0)
MCHC: 33.9 g/dL (ref 30.0–36.0)
MCV: 84.8 fL (ref 80.0–100.0)
Platelets: 208 10*3/uL (ref 150–400)
RBC: 3.41 MIL/uL — ABNORMAL LOW (ref 3.87–5.11)
RDW: 14.5 % (ref 11.5–15.5)
WBC: 38.6 10*3/uL — ABNORMAL HIGH (ref 4.0–10.5)
nRBC: 0 % (ref 0.0–0.2)

## 2021-09-24 LAB — GLUCOSE, CAPILLARY
Glucose-Capillary: 103 mg/dL — ABNORMAL HIGH (ref 70–99)
Glucose-Capillary: 120 mg/dL — ABNORMAL HIGH (ref 70–99)
Glucose-Capillary: 110 mg/dL — ABNORMAL HIGH (ref 70–99)
Glucose-Capillary: 126 mg/dL — ABNORMAL HIGH (ref 70–99)
Glucose-Capillary: 118 mg/dL — ABNORMAL HIGH (ref 70–99)
Glucose-Capillary: 126 mg/dL — ABNORMAL HIGH (ref 70–99)

## 2021-09-24 LAB — MAGNESIUM: Magnesium: 2 mg/dL (ref 1.7–2.4)

## 2021-09-24 MED ORDER — POTASSIUM CHLORIDE 10 MEQ/100ML IV SOLN
10.0000 meq | Freq: Once | INTRAVENOUS | Status: AC
  Administered 2021-09-24: 10 meq via INTRAVENOUS
  Filled 2021-09-24: qty 100

## 2021-09-24 MED ORDER — POTASSIUM CHLORIDE 20 MEQ PO PACK
20.0000 meq | PACK | ORAL | Status: AC
  Administered 2021-09-24 (×2): 20 meq
  Filled 2021-09-24 (×2): qty 1

## 2021-09-24 MED ORDER — FUROSEMIDE 10 MG/ML IJ SOLN: 40.0000 mg | Freq: Once | INTRAMUSCULAR | Status: DC

## 2021-09-24 MED ORDER — FUROSEMIDE 10 MG/ML IJ SOLN
40.0000 mg | Freq: Once | INTRAMUSCULAR | Status: AC
  Administered 2021-09-24: 40 mg via INTRAVENOUS
  Filled 2021-09-24: qty 4

## 2021-09-24 MED ORDER — PANTOPRAZOLE 2 MG/ML SUSPENSION
40.0000 mg | Freq: Every day | ORAL | Status: DC
  Administered 2021-09-24 – 2021-09-26 (×3): 40 mg
  Filled 2021-09-24 (×3): qty 20

## 2021-09-24 MED ORDER — DOCUSATE SODIUM 50 MG/5ML PO LIQD: 100.0000 mg | Freq: Two times a day (BID) | ORAL | Status: DC | PRN

## 2021-09-24 MED ORDER — POTASSIUM CHLORIDE 10 MEQ/50ML IV SOLN
10.0000 meq | INTRAVENOUS | Status: DC
  Administered 2021-09-24 (×3): 10 meq via INTRAVENOUS
  Filled 2021-09-24 (×4): qty 50

## 2021-09-24 MED ORDER — GABAPENTIN 250 MG/5ML PO SOLN
100.0000 mg | Freq: Every day | ORAL | Status: DC
  Administered 2021-09-24 – 2021-09-25 (×2): 100 mg
  Filled 2021-09-24 (×2): qty 2

## 2021-09-24 NOTE — Progress Notes (Signed)
[facility] Progress Note Patient Name: Deborah Jordan DOB: 09-23-1933 MRN: [id number]   Date of Service  09/24/2021  HPI/Events of Note  Nursing request for I/O cath PRN and portable CXR in AM.  eICU Interventions  Plan: Portable CXR at 5 AM. I/O Cath PRN.      Intervention Category Major Interventions: Other:  Katina Remick Cornelia Copa 09/24/2021, 1:34 AM

## 2021-09-24 NOTE — Progress Notes (Signed)
[facility] for Infectious Disease    Date of Admission:  09/22/2021      ID: Deborah Jordan is a 87 y.o. female with e.coli and infantarius bacteremia, presumably GI source.  Active Problems:   Septic shock (HCC)   AKI (acute kidney injury) ([place])   Hypokalemia   Transaminitis    Subjective: Afebrile. Remains off of pressor. Still intubated  Medications:   arformoterol  15 mcg Nebulization BID   aspirin  81 mg Per Tube Daily   chlorhexidine gluconate (MEDLINE KIT)  15 mL Mouth Rinse BID   Chlorhexidine Gluconate Cloth  6 each Topical Daily   gabapentin  100 mg Per Tube QHS   heparin  5,000 Units Subcutaneous Q8H   mouth rinse  15 mL Mouth Rinse 10 times per day   pantoprazole sodium  40 mg Per Tube Daily   polyethylene glycol  17 g Per Tube Daily   revefenacin  175 mcg Nebulization Daily   sodium chloride flush  10-40 mL Intracatheter Q12H    Objective: Vital signs in last 24 hours: Temp:  [97.2 F (36.2 C)-98.4 F (36.9 C)] 98.4 F (36.9 C) (10/07 1523) Pulse Rate:  [56-73] 73 (10/07 1530) Resp:  [13-29] 29 (10/07 1530) BP: (98-156)/(40-121) 156/121 (10/07 1300) SpO2:  [90 %-95 %] 91 % (10/07 1530) Arterial Line BP: (109-127)/(46-56) 127/56 (10/07 1000) FiO2 (%):  [40 %-60 %] 40 % (10/07 1510) Weight:  [87.5 kg] 87.5 kg (10/07 0500) Physical Exam  Constitutional:  sedated. appears well-developed and well-nourished. No distress.  HENT: [place]/AT, PERRLA, no scleral icterus Mouth/Throat: OETT in place Cardiovascular: Normal rate, regular rhythm and normal heart sounds. Exam reveals no gallop and no friction rub.  No murmur heard.  Pulmonary/Chest: Effort normal and breath sounds normal. No respiratory distress.  has no wheezes.  Neck = supple, no nuchal rigidity Abdominal: Soft. Bowel sounds are decreased.  exhibits no distension. There is no tenderness.  Lymphadenopathy: no cervical adenopathy. No axillary adenopathy GXQ:JJHER edema Skin: Skin is warm and dry.  No rash noted. No erythema.    Lab Results Recent Labs    09/23/21 0343 09/24/21 0428 09/24/21 1313  WBC 46.3* 38.6*  --   HGB 10.3* 9.8*  --   HCT 30.0* 28.9*  --   NA 131* 131* 133*  K 4.3 3.2* 3.7  CL 101 101 101  CO2 19* 21* 25  BUN 20 34* 32*  CREATININE 1.11* 1.02* 1.01*   Liver Panel Recent Labs    09/22/21 0811 09/23/21 0343  PROT 5.1* 5.1*  ALBUMIN 2.8* 2.4*  AST 468* 240*  ALT 499* 370*  ALKPHOS 186* 153*  BILITOT 2.3* 1.4*  BILIDIR  --  0.7*  IBILI  --  0.7   Sedimentation Rate No results for input(s): ESRSEDRATE in the last 72 hours. C-Reactive Protein No results for input(s): CRP in the last 72 hours.  Microbiology: Repeat blood cx NGTD on day 0 Blood cx 10/5 = ecoli and strep infantarius Studies/Results: DG CHEST PORT 1 VIEW  Addendum Date: 09/24/2021   ADDENDUM REPORT: 09/24/2021 08:51 ADDENDUM: These results will be called to the ordering clinician or representative by the Radiologist Assistant, and communication documented in the PACS or [employer]. Electronically Signed   By: Zetta Bills M.D.   On: 09/24/2021 08:51   Result Date: 09/24/2021 CLINICAL DATA:  Endotracheal to placement. EXAM: PORTABLE CHEST 1 VIEW COMPARISON:  Exam from September 23, 2021. FINDINGS: Endotracheal tube terminates proximally 1.7  cm above the carina. Gastric tube courses through in off field of the radiograph. RIGHT-sided central venous access device or catheter projects over the RIGHT axilla slowly retracted over a series of prior imaging studies now projecting over the edge of the RIGHT upper chest just below the RIGHT second rib, proximally 2.8 cm peripheral to the clavicle, previously projecting underneath the clavicular head. EKG leads project over the chest. Heart size is enlarged as before. No new area of consolidation with basilar airspace disease and obscured LEFT and RIGHT hemidiaphragm with bilateral effusions. On limited assessment there is no acute skeletal  process. IMPRESSION: Presumed RIGHT-sided central venous access device slowly retracted over a series of prior imaging studies. May be peripherally placed now or outside of the vessel and should be correlated with catheter function. Endotracheal tube 1.7 cm above the carina. Cardiomegaly with persistent effusions, basilar airspace disease and pulmonary edema. Electronically Signed: By: Zetta Bills M.D. On: 09/24/2021 08:03   DG Chest Port 1 View  Result Date: 09/23/2021 CLINICAL DATA:  Respiratory failure. EXAM: PORTABLE CHEST 1 VIEW COMPARISON:  09/22/2021 FINDINGS: The endotracheal tube is 17 mm above the carina. The NG tube is coursing down the esophagus and into the stomach. The right subclavian line has an unusual course. May be looped and a branch of the right subclavian vein. The tip is in the subclavian vein near its junction with the right jugular vein. Stable cardiac enlargement and tortuous calcified thoracic aorta. Persistent perihilar predominant interstitial and airspace process, likely pulmonary edema. Small bilateral pleural effusions. IMPRESSION: 1. Support apparatus as above. 2. Persistent cardiac enlargement, pulmonary edema and pleural effusions. Electronically Signed   By: Marijo Sanes M.D.   On: 09/23/2021 09:35   US Abdomen Limited RUQ (LIVER/GB)  Result Date: 09/23/2021 CLINICAL DATA:  Elevated liver enzymes EXAM: ULTRASOUND ABDOMEN LIMITED RIGHT UPPER QUADRANT COMPARISON:  None. FINDINGS: Gallbladder: Prior cholecystectomy. Common bile duct: Diameter: 6-9 mm Liver: Mildly coarsened liver echotexture with mildly nodular left hepatic lobe contours. Portal vein is patent on color Doppler imaging with normal direction of blood flow towards the liver. Other: Trace ascites.  Right pleural effusion. IMPRESSION: Mildly coarsened liver echotexture with mildly nodular left hepatic lobe contours, which can be seen in chronic liver disease. Common bile duct measures 6-9 mm, which can be  normal after cholecystectomy. Correlate with bilirubin levels. Trace ascites.  Right pleural effusion. Electronically Signed   By: Maurine Simmering M.D.   On: 09/23/2021 10:00     Assessment/Plan: Polymicrobial bacteremia = continue on ceftriaxone and metronidazole. Important to note that her strep species in part of strep bovis group associated with endocarditis, as well as colon ca.  Recommend TTE  Respiratory distress s/p intubation = continues on low settings, and being diuresed. Defer to PCCM management/timing of extubation  Leukocytosis = trending downward, recommend repeat cbc tomorrow.    [facility] Infectious Diseases Pager: [phone]  09/24/2021, 3:47 PM

## 2021-09-24 NOTE — Progress Notes (Signed)
  Echocardiogram 2D Echocardiogram has been performed.  Merrie Roof F 09/24/2021, 5:23 PM

## 2021-09-24 NOTE — Progress Notes (Signed)
[place] [facility] Progress Note Patient Name: Deborah Jordan DOB: [birth year] MRN: [id number]   Date of Service  09/24/2021  HPI/Events of Note  Patient only had 400 mL urine output yesterday. CXR from 09/22/2021 AM revealed cardiac enlargement, pulmonary edema and pleural effusions.   eICU Interventions  Plan: Lasix 40 mg IV X 1 now.      Intervention Category Major Interventions: Other:  Reyes Fifield Cornelia Copa 09/24/2021, 3:21 AM

## 2021-09-24 NOTE — Progress Notes (Signed)
NAME:  Deborah Jordan, MRN:  [id number], DOB:  07/05/1933, LOS: 2 ADMISSION DATE:  09/22/2021, CONSULTATION DATE:  09/22/21 REFERRING MD:  Melina Copa CHIEF COMPLAINT:  Abd pain   History of Present Illness:  [name] is a 87 y.o. female who has a PMH as outlined below.  She presented to [facility] ED 10/5 with abd pain x 3 - 4 days.  Initially was intermittent then night of 10/4, became constant and worsened with radiation into back.  EMS was called and she was found to be hypotensive in the 80s.  She was brought to ED where she was still hypotensive.  She received 2L fluids without improvement so was started on Levophed.  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  Due to shock, PCCM asked to admit to ICU.  Pertinent  Medical History:  has Degenerative arthritis of hip; Pleural effusion; Hyponatremia; Hypertension; Bilateral chronic knee pain; Chronic bilateral low back pain with bilateral sciatica; Dyspnea and respiratory abnormalities; Septic shock ([place]); AKI (acute kidney injury) ([place]); Hypokalemia; and Transaminitis on their problem list.  Significant Hospital Events: Including procedures, antibiotic start and stop dates in addition to other pertinent events   10/5 > admit 10/6 > Weaned off pressors  Interim History / Subjective:   Weaned off pressors yesterday. Diuresed overnight with good UOP  Objective:  Blood pressure (!) 109/53, pulse 61, temperature 97.7 F (36.5 C), temperature source Oral, resp. rate (!) 21, height 4\' 9"  (1.448 m), weight 87.5 kg, SpO2 95 %.    Vent Mode: PRVC FiO2 (%):  [60 %-90 %] 60 % Set Rate:  [20 bmp] 20 bmp Vt Set:  [370 mL] 370 mL PEEP:  [5 cmH20-8 cmH20] 5 cmH20 Plateau Pressure:  [11 cmH20-19 cmH20] 15 cmH20   Intake/Output Summary (Last 24 hours) at 09/24/2021 0744 Last data  filed at 09/24/2021 0700 Gross per 24 hour  Intake 1587.2 ml  Output 1100 ml  Net 487.2 ml   Filed Weights   09/22/21 1116 09/23/21 0500 09/24/21 0500  Weight: 83.6 kg 86.9 kg 87.5 kg    Physical Exam: General: Elderly-appearing, no acute distress, RASS 0 HENT: [place], AT, ETT in place Eyes: EOMI, no scleral icterus Respiratory: Diminished breath sounds bilaterally.  No crackles, wheezing or rales Cardiovascular: RRR, -M/R/G, no JVD GI: BS+, soft, nontender Extremities:-Edema,-tenderness Neuro: Drowsy, awakens to voice, follows commands, moves extremities x 4 GU: Purewick in place  Labs/imaging personally reviewed:  CTA chest/abd/pelv 10/5 >  neg for dissection or aneurysm but showed lare amount of atherosclerotic plaque with subtotal occlusion involving the origin and proximal 1.3cm of celiac artery with collateral supply from the SMA.  Assessment & Plan:  Septic shock to polymicrobial bacteremia (E.Coli and Strep infantarious), POA - shock resolved In setting of bacteremia due to E. Coli and Strep infantarius. Possibly due to gut translocation given her underlying vascular issues. Differential also includes cardiogenic shock. -Appreciate ID involvement. De-escalate abx to Ceftriaxone + metronidazole -Repeat Bcx for bacteremia clearance -F/u echocardiogram  Acute hypoxemic respiratory failure secondary to critical illness Pulmonary edema -Full vent support. Wean FIO2/PEEP for goal SpO2 88-95% -SBT/WUA daily -VAP -Brovana -PAD protocol for RASS goal 0: Precedex -Diuresis for daily goal net negative  AKI - improving Hypokalemia. Hyponatremia. AGMA - resolved Lactic Acidosis - improving - Trend BMET and Mg - Replete potassium -  Insert foley for retention  Transaminitis - presumed shock liver in setting of shock - Trend LFT's  Subtotal occlusion of celiac artery. - Supportive care. - Continue home ASA. - No signs of acute abdomen, will consider vascular surgery consult  if needed.  Hx CAD, HTN, HLD, dCHF, LVH, RBB. - Continue home ASA. - Hold home Amlodipine, Carvedilol, Furosemide.  Hx anxiety. - Hold home Duloxetine, Gabapentin. - PRN ativan  Best practice (evaluated daily):  Diet/type: tubefeeds DVT prophylaxis: prophylactic heparin  GI prophylaxis: N/A and PPI Lines: Central line Foley:  N/A Code Status:  full code Last date of multidisciplinary goals of care discussion: None.  Labs   Labs reviewed - WBC and LA improving. Hypokalemia. Improving AKI. LFTs decreasing  CBC: Recent Labs  Lab 09/22/21 0811 09/22/21 0831 09/22/21 1359 09/23/21 0343 09/24/21 0428  WBC 33.8*  --   --  46.3* 38.6*  NEUTROABS 29.7*  --   --   --   --   HGB 9.6* 10.2* 10.9* 10.3* 9.8*  HCT 29.6* 30.0* 32.0* 30.0* 28.9*  MCV 90.0  --   --  85.5 84.8  PLT 243  --   --  223 284    Basic Metabolic Panel: Recent Labs  Lab 09/22/21 0811 09/22/21 0831 09/22/21 1359 09/22/21 1623 09/23/21 0343 09/23/21 0905 09/23/21 1130 09/23/21 2005 09/24/21 0428  NA 131* 131* 133* 130* 131*  --   --   --  131*  K 3.3* 3.2* 3.1* 4.4 4.3  --   --   --  3.2*  CL 98 97*  --  100 101  --   --   --  101  CO2 17*  --   --  19* 19*  --   --   --  21*  GLUCOSE 78 80  --  107* 116*  --   --   --  113*  BUN 20 20  --  17 20  --   --   --  34*  CREATININE 1.49* 1.20*  --  1.11* 1.11*  --   --   --  1.02*  CALCIUM 8.9  --   --  8.8* 8.4*  --   --   --  8.5*  MG  --   --   --   --  1.2* 2.5* 2.7* 2.4  --   PHOS  --   --   --   --  4.4  --   --   --   --    GFR: Estimated Creatinine Clearance: 36.4 mL/min (A) (by C-G formula based on SCr of 1.02 mg/dL (H)). Recent Labs  Lab 09/22/21 0811 09/22/21 0858 09/22/21 1624 09/22/21 1955 09/23/21 0343 09/23/21 0905 09/23/21 1126 09/24/21 0428  WBC 33.8*  --   --   --  46.3*  --   --  38.6*  LATICACIDVEN  --    < > 4.1* 3.9*  --  2.3* 2.1*  --    < > = values in this interval not displayed.    Liver Function Tests: Recent  Labs  Lab 09/22/21 0811 09/23/21 0343  AST 468* 240*  ALT 499* 370*  ALKPHOS 186* 153*  BILITOT 2.3* 1.4*  PROT 5.1* 5.1*  ALBUMIN 2.8* 2.4*   CBG: Recent Labs  Lab 09/23/21 1142 09/23/21 1542 09/23/21 1946 09/23/21 2316 09/24/21 0331  GLUCAP 125* 122* 113* 96 103*    Critical care time:  45 min.   The patient is critically  ill with multiple organ systems failure and requires high complexity decision making for assessment and support, frequent evaluation and titration of therapies, application of advanced monitoring technologies and extensive interpretation of multiple databases.   Rodman Pickle, M.D. [facility] Pulmonary/Critical Care Medicine 09/24/2021 7:44 AM   Please see Amion for pager number to reach on-call Pulmonary and Critical Care Team.

## 2021-09-24 NOTE — Progress Notes (Signed)
K+ 3.2 Replaced per protocol  

## 2021-09-25 DIAGNOSIS — J9601 Acute respiratory failure with hypoxia: Secondary | ICD-10-CM | POA: Diagnosis not present

## 2021-09-25 DIAGNOSIS — N179 Acute kidney failure, unspecified: Secondary | ICD-10-CM | POA: Diagnosis not present

## 2021-09-25 DIAGNOSIS — E876 Hypokalemia: Secondary | ICD-10-CM | POA: Diagnosis not present

## 2021-09-25 LAB — ECHOCARDIOGRAM LIMITED
Area-P 1/2: 2.91 cm2
Height: 57 in
S' Lateral: 2.9 cm
Weight: 3086.44 oz

## 2021-09-25 LAB — CULTURE, BLOOD (ROUTINE X 2)

## 2021-09-25 LAB — BASIC METABOLIC PANEL
Anion gap: 9 (ref 5–15)
Anion gap: 10 (ref 5–15)
BUN: 30 mg/dL — ABNORMAL HIGH (ref 8–23)
BUN: 27 mg/dL — ABNORMAL HIGH (ref 8–23)
CO2: 25 mmol/L (ref 22–32)
CO2: 23 mmol/L (ref 22–32)
Calcium: 8.3 mg/dL — ABNORMAL LOW (ref 8.9–10.3)
Calcium: 8.1 mg/dL — ABNORMAL LOW (ref 8.9–10.3)
Chloride: 102 mmol/L (ref 98–111)
Chloride: 101 mmol/L (ref 98–111)
Creatinine, Ser: 0.88 mg/dL (ref 0.44–1.00)
Creatinine, Ser: 0.8 mg/dL (ref 0.44–1.00)
GFR, Estimated: >60 mL/min (ref >60)
GFR, Estimated: >60 mL/min (ref >60)
Glucose, Bld: 144 mg/dL — ABNORMAL HIGH (ref 70–99)
Glucose, Bld: 154 mg/dL — ABNORMAL HIGH (ref 70–99)
Potassium: 3.3 mmol/L — ABNORMAL LOW (ref 3.5–5.1)
Potassium: 3.6 mmol/L (ref 3.5–5.1)
Sodium: 136 mmol/L (ref 135–145)
Sodium: 134 mmol/L — ABNORMAL LOW (ref 135–145)

## 2021-09-25 LAB — GLUCOSE, CAPILLARY
Glucose-Capillary: 120 mg/dL — ABNORMAL HIGH (ref 70–99)
Glucose-Capillary: 137 mg/dL — ABNORMAL HIGH (ref 70–99)
Glucose-Capillary: 116 mg/dL — ABNORMAL HIGH (ref 70–99)
Glucose-Capillary: 128 mg/dL — ABNORMAL HIGH (ref 70–99)
Glucose-Capillary: 126 mg/dL — ABNORMAL HIGH (ref 70–99)
Glucose-Capillary: 86 mg/dL (ref 70–99)

## 2021-09-25 MED ORDER — ALPRAZOLAM 0.5 MG PO TABS
0.2500 mg | ORAL_TABLET | Freq: Three times a day (TID) | ORAL | Status: DC
  Administered 2021-09-25 – 2021-09-26 (×3): 0.25 mg
  Filled 2021-09-25 (×3): qty 1

## 2021-09-25 MED ORDER — FUROSEMIDE 10 MG/ML IJ SOLN
40.0000 mg | Freq: Once | INTRAMUSCULAR | Status: AC
  Administered 2021-09-25: 40 mg via INTRAVENOUS
  Filled 2021-09-25: qty 4

## 2021-09-25 MED ORDER — POTASSIUM CHLORIDE 10 MEQ/100ML IV SOLN
10.0000 meq | INTRAVENOUS | Status: AC
  Administered 2021-09-25 (×4): 10 meq via INTRAVENOUS
  Filled 2021-09-25 (×4): qty 100

## 2021-09-25 MED ORDER — POTASSIUM CHLORIDE 20 MEQ PO PACK
20.0000 meq | PACK | ORAL | Status: AC
  Administered 2021-09-25 (×2): 20 meq
  Filled 2021-09-25 (×2): qty 1

## 2021-09-25 MED ORDER — DEXMEDETOMIDINE HCL IN NACL 400 MCG/100ML IV SOLN
0.2000 ug/kg/h | INTRAVENOUS | Status: DC
  Administered 2021-09-25: 0.2 ug/kg/h via INTRAVENOUS
  Administered 2021-09-26: 0.6 ug/kg/h via INTRAVENOUS
  Filled 2021-09-25 (×2): qty 100

## 2021-09-25 NOTE — Progress Notes (Signed)
K+3.3 ?Replaced per protocol  ?

## 2021-09-25 NOTE — Progress Notes (Signed)
[place] Progress Note Patient Name: Deborah Jordan DOB: 01/08/1933 MRN: [id number]   Date of Service  09/25/2021  HPI/Events of Note  Massive diarrhea - Nursing request for Flexiseal.  eICU Interventions  Plan: Place Flexiseal.     Intervention Category Major Interventions: Other:  Lysle Dingwall 09/25/2021, 8:40 PM

## 2021-09-25 NOTE — Progress Notes (Signed)
[place] Progress Note Patient Name: Deborah Jordan DOB: Apr 27, 1933 MRN: [id number]   Date of Service  09/25/2021  HPI/Events of Note  Agitation - Nursing request to renew restraint orders.   eICU Interventions  Will renew bilateral soft wrist restraints X 12 hours.     Intervention Category Major Interventions: Delirium, psychosis, severe agitation - evaluation and management  Raisha Brabender [place] 09/25/2021, 8:04 PM

## 2021-09-25 NOTE — Progress Notes (Signed)
[place] Progress Note Patient Name: Deborah Jordan DOB: 1933/01/10 MRN: [id number]   Date of Service  09/25/2021  HPI/Events of Note  Agitation - Patient crying and reaching for ETT. Precedex IV infusion stopped earlier today.   eICU Interventions  Plan: Restart low dose Precedex IV infusion (0.2-0.6 mcg/kg/hour). Titrate to RASS = 0.     Intervention Category Major Interventions: Delirium, psychosis, severe agitation - evaluation and management  Eldrige Pitkin [place] 09/25/2021, 10:38 PM

## 2021-09-25 NOTE — Progress Notes (Addendum)
NAME:  Deborah Jordan, MRN:  [id number], DOB:  1933/07/18, LOS: 3 ADMISSION DATE:  09/22/2021, CONSULTATION DATE:  09/22/21 REFERRING MD:  Melina Copa CHIEF COMPLAINT:  Abd pain   History of Present Illness:  Deborah Jordan is a 87 y.o. female who has a PMH as outlined below.  She presented to [facility] ED 10/5 with abd pain x 3 - 4 days.  Initially was intermittent then night of 10/4, became constant and worsened with radiation into back.  EMS was called and she was found to be hypotensive in the 80s.  She was brought to ED where she was still hypotensive.  She received 2L fluids without improvement so was started on Levophed.  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  Due to shock, PCCM asked to admit to ICU.  Pertinent  Medical History:  has Degenerative arthritis of hip; Pleural effusion; Hyponatremia; Hypertension; Bilateral chronic knee pain; Chronic bilateral low back pain with bilateral sciatica; Dyspnea and respiratory abnormalities; Septic shock ([place]); AKI (acute kidney injury) ([place]); Hypokalemia; and Transaminitis on their problem list.  Significant Hospital Events: Including procedures, antibiotic start and stop dates in addition to other pertinent events   10/5 > admit 10/6 > Weaned off pressors 10/7 > Tolerated PS  Interim History / Subjective:   Tolerated PS however became anxious and tachypneic. Good UOP with diuresis  Objective:  Blood pressure (!) 119/51, pulse 68, temperature 98.6 F (37 C), temperature source Oral, resp. rate 18, height 4\' 9"  (1.448 m), weight 84.3 kg, SpO2 93 %.    Vent Mode: PRVC FiO2 (%):  [40 %] 40 % Set Rate:  [20 bmp] 20 bmp Vt Set:  [370 mL] 370 mL PEEP:  [5 cmH20] 5 cmH20 Pressure Support:  [5 cmH20] 5 cmH20 Plateau Pressure:  [15 cmH20-17 cmH20] 17 cmH20   Intake/Output  Summary (Last 24 hours) at 09/25/2021 0749 Last data filed at 09/25/2021 0600 Gross per 24 hour  Intake 2580.76 ml  Output 2667 ml  Net -86.24 ml   Filed Weights   09/23/21 0500 09/24/21 0500 09/25/21 0500  Weight: 86.9 kg 87.5 kg 84.3 kg   Physical Exam: General: Elderly-appearing, no acute distress HENT: [place], AT, ETT in place Eyes: EOMI, no scleral icterus Respiratory: Clear to auscultation bilaterally.  No crackles, wheezing or rales Cardiovascular: RRR, -M/R/G, no JVD GI: BS+, soft, nontender Extremities:-Edema,-tenderness Neuro: Drowsy, follows commands, moves extremities x 4   Labs/imaging personally reviewed:  CTA chest/abd/pelv 10/5 >  neg for dissection or aneurysm but showed lare amount of atherosclerotic plaque with subtotal occlusion involving the origin and proximal 1.3cm of celiac artery with collateral supply from the SMA.  Echo 09/24/21 - EF normal. Mitral valve prolapse - new compared to 2021. No vegetations  Assessment & Plan:  Septic shock to polymicrobial bacteremia (E.Coli and Strep infantarious), POA - shock resolved In setting of bacteremia due to E. Coli and Strep infantarius. Echo neg for vegetations. Possibly due to gut translocation given her underlying vascular issues. Differential also includes cardiogenic shock. -Appreciate ID involvement. De-escalate abx to Ceftriaxone + metronidazole -Repeat Bcx sent on 10/7 for bacteremia clearance  Acute hypoxemic respiratory failure secondary to critical illness Pulmonary edema -Full vent support. Wean FIO2/PEEP for goal SpO2 88-95% -SBT/WUA daily -VAP -Brovana -PAD protocol for RASS goal 0: Precedex -Diuresis for daily goal net negative  AKI - resolved Hypokalemia. Hyponatremia. AGMA - resolved Lactic Acidosis - improving - Trend BMET and Mg - Replete K - Foley  Transaminitis - presumed shock liver in setting of shock - Trend LFT's  Subtotal occlusion of celiac artery. - Supportive care. -  Continue home ASA. - No signs of acute abdomen, will consider vascular surgery consult if needed.  Hx CAD, HTN, HLD, dCHF, LVH, RBB. Mild MV regurg with prolapse - Continue home ASA. - Hold home Amlodipine, Carvedilol, Furosemide - Consider Cardiology consult inpatient vs outpatient  Hx anxiety. - Hold home Duloxetine, Gabapentin. - PRN ativan  Best practice (evaluated daily):  Diet/type: tubefeeds DVT prophylaxis: prophylactic heparin  GI prophylaxis: N/A and PPI Lines: Central line Foley:  N/A Code Status:  full code Last date of multidisciplinary goals of care discussion: None.  Labs   Labs reviewed - K hypokalemia, resolved AKI  CBC: Recent Labs  Lab 09/22/21 0811 09/22/21 0831 09/22/21 1359 09/23/21 0343 09/24/21 0428  WBC 33.8*  --   --  46.3* 38.6*  NEUTROABS 29.7*  --   --   --   --   HGB 9.6* 10.2* 10.9* 10.3* 9.8*  HCT 29.6* 30.0* 32.0* 30.0* 28.9*  MCV 90.0  --   --  85.5 84.8  PLT 243  --   --  223 295    Basic Metabolic Panel: Recent Labs  Lab 09/22/21 1623 09/23/21 0343 09/23/21 0905 09/23/21 1130 09/23/21 2005 09/24/21 0428 09/24/21 1313 09/25/21 0508  NA 130* 131*  --   --   --  131* 133* 136  K 4.4 4.3  --   --   --  3.2* 3.7 3.3*  CL 100 101  --   --   --  101 101 102  CO2 19* 19*  --   --   --  21* 25 25  GLUCOSE 107* 116*  --   --   --  113* 119* 144*  BUN 17 20  --   --   --  34* 32* 30*  CREATININE 1.11* 1.11*  --   --   --  1.02* 1.01* 0.88  CALCIUM 8.8* 8.4*  --   --   --  8.5* 8.5* 8.3*  MG  --  1.2* 2.5* 2.7* 2.4  --  2.0  --   PHOS  --  4.4  --   --   --   --   --   --    GFR: Estimated Creatinine Clearance: 41.2 mL/min (by C-G formula based on SCr of 0.88 mg/dL). Recent Labs  Lab 09/22/21 0811 09/22/21 0858 09/22/21 1624 09/22/21 1955 09/23/21 0343 09/23/21 0905 09/23/21 1126 09/24/21 0428  WBC 33.8*  --   --   --  46.3*  --   --  38.6*  LATICACIDVEN  --    < > 4.1* 3.9*  --  2.3* 2.1*  --    < > = values in this  interval not displayed.    Liver Function Tests: Recent Labs  Lab 09/22/21 0811 09/23/21 0343  AST 468* 240*  ALT 499* 370*  ALKPHOS 186* 153*  BILITOT 2.3* 1.4*  PROT 5.1* 5.1*  ALBUMIN 2.8* 2.4*   CBG: Recent Labs  Lab 09/24/21 1522 09/24/21 1940 09/24/21 2325 09/25/21 0331 09/25/21 0742  GLUCAP 126* 118* 126* 120* 137*    Critical care time:  40 min.   The patient is critically ill with multiple organ systems failure and requires high complexity decision  making for assessment and support, frequent evaluation and titration of therapies, application of advanced monitoring technologies and extensive interpretation of multiple databases.  Independent Critical Care Time: 40 Minutes.   Rodman Pickle, M.D. [facility] Pulmonary/Critical Care Medicine 09/25/2021 9:55 AM   Please see Amion for pager number to reach on-call Pulmonary and Critical Care Team.

## 2021-09-26 DIAGNOSIS — J9601 Acute respiratory failure with hypoxia: Secondary | ICD-10-CM | POA: Diagnosis not present

## 2021-09-26 DIAGNOSIS — R6521 Severe sepsis with septic shock: Secondary | ICD-10-CM | POA: Diagnosis not present

## 2021-09-26 DIAGNOSIS — R7401 Elevation of levels of liver transaminase levels: Secondary | ICD-10-CM | POA: Diagnosis not present

## 2021-09-26 DIAGNOSIS — A419 Sepsis, unspecified organism: Secondary | ICD-10-CM | POA: Diagnosis not present

## 2021-09-26 LAB — BASIC METABOLIC PANEL
Anion gap: 11 (ref 5–15)
BUN: 31 mg/dL — ABNORMAL HIGH (ref 8–23)
CO2: 25 mmol/L (ref 22–32)
Calcium: 8.3 mg/dL — ABNORMAL LOW (ref 8.9–10.3)
Chloride: 101 mmol/L (ref 98–111)
Creatinine, Ser: 1.01 mg/dL — ABNORMAL HIGH (ref 0.44–1.00)
GFR, Estimated: 54 mL/min — ABNORMAL LOW (ref >60)
Glucose, Bld: 165 mg/dL — ABNORMAL HIGH (ref 70–99)
Potassium: 4 mmol/L (ref 3.5–5.1)
Sodium: 137 mmol/L (ref 135–145)

## 2021-09-26 LAB — CBC
HCT: 29.7 % — ABNORMAL LOW (ref 36.0–46.0)
HCT: 28.7 % — ABNORMAL LOW (ref 36.0–46.0)
Hemoglobin: 10 g/dL — ABNORMAL LOW (ref 12.0–15.0)
Hemoglobin: 9.8 g/dL — ABNORMAL LOW (ref 12.0–15.0)
MCH: 28.2 pg (ref 26.0–34.0)
MCH: 28.9 pg (ref 26.0–34.0)
MCHC: 33.7 g/dL (ref 30.0–36.0)
MCHC: 34.1 g/dL (ref 30.0–36.0)
MCV: 83.7 fL (ref 80.0–100.0)
MCV: 84.7 fL (ref 80.0–100.0)
Platelets: 220 10*3/uL (ref 150–400)
Platelets: 226 10*3/uL (ref 150–400)
RBC: 3.55 MIL/uL — ABNORMAL LOW (ref 3.87–5.11)
RBC: 3.39 MIL/uL — ABNORMAL LOW (ref 3.87–5.11)
RDW: 14.2 % (ref 11.5–15.5)
RDW: 14.6 % (ref 11.5–15.5)
WBC: 31.2 10*3/uL — ABNORMAL HIGH (ref 4.0–10.5)
WBC: 26.3 10*3/uL — ABNORMAL HIGH (ref 4.0–10.5)
nRBC: 0 % (ref 0.0–0.2)
nRBC: 0 % (ref 0.0–0.2)

## 2021-09-26 LAB — CULTURE, BLOOD (ROUTINE X 2)

## 2021-09-26 LAB — COMPREHENSIVE METABOLIC PANEL
ALT: 114 U/L — ABNORMAL HIGH (ref 0–44)
AST: 33 U/L (ref 15–41)
Albumin: 2.2 g/dL — ABNORMAL LOW (ref 3.5–5.0)
Alkaline Phosphatase: 191 U/L — ABNORMAL HIGH (ref 38–126)
Anion gap: 11 (ref 5–15)
BUN: 28 mg/dL — ABNORMAL HIGH (ref 8–23)
CO2: 25 mmol/L (ref 22–32)
Calcium: 8.2 mg/dL — ABNORMAL LOW (ref 8.9–10.3)
Chloride: 101 mmol/L (ref 98–111)
Creatinine, Ser: 0.98 mg/dL (ref 0.44–1.00)
GFR, Estimated: 56 mL/min — ABNORMAL LOW (ref >60)
Glucose, Bld: 113 mg/dL — ABNORMAL HIGH (ref 70–99)
Potassium: 3.7 mmol/L (ref 3.5–5.1)
Sodium: 137 mmol/L (ref 135–145)
Total Bilirubin: 0.8 mg/dL (ref 0.3–1.2)
Total Protein: 5.1 g/dL — ABNORMAL LOW (ref 6.5–8.1)

## 2021-09-26 LAB — GLUCOSE, CAPILLARY
Glucose-Capillary: 113 mg/dL — ABNORMAL HIGH (ref 70–99)
Glucose-Capillary: 115 mg/dL — ABNORMAL HIGH (ref 70–99)
Glucose-Capillary: 146 mg/dL — ABNORMAL HIGH (ref 70–99)
Glucose-Capillary: 117 mg/dL — ABNORMAL HIGH (ref 70–99)
Glucose-Capillary: 99 mg/dL (ref 70–99)
Glucose-Capillary: 101 mg/dL — ABNORMAL HIGH (ref 70–99)

## 2021-09-26 LAB — MAGNESIUM: Magnesium: 1.4 mg/dL — ABNORMAL LOW (ref 1.7–2.4)

## 2021-09-26 MED ORDER — POTASSIUM CHLORIDE 20 MEQ PO PACK
40.0000 meq | PACK | Freq: Once | ORAL | Status: AC
  Administered 2021-09-26: 40 meq
  Filled 2021-09-26: qty 2

## 2021-09-26 MED ORDER — CHLORHEXIDINE GLUCONATE 0.12 % MT SOLN
15.0000 mL | Freq: Two times a day (BID) | OROMUCOSAL | Status: DC
  Administered 2021-09-26 – 2021-10-04 (×16): 15 mL via OROMUCOSAL
  Filled 2021-09-26 (×13): qty 15

## 2021-09-26 MED ORDER — MAGNESIUM SULFATE 4 GM/100ML IV SOLN
4.0000 g | Freq: Once | INTRAVENOUS | Status: AC
  Administered 2021-09-26: 4 g via INTRAVENOUS
  Filled 2021-09-26: qty 100

## 2021-09-26 MED ORDER — ONDANSETRON HCL 4 MG/2ML IJ SOLN
4.0000 mg | Freq: Three times a day (TID) | INTRAMUSCULAR | Status: DC | PRN
  Administered 2021-09-26: 4 mg via INTRAVENOUS
  Filled 2021-09-26: qty 2

## 2021-09-26 MED ORDER — ORAL CARE MOUTH RINSE
15.0000 mL | Freq: Two times a day (BID) | OROMUCOSAL | Status: DC
  Administered 2021-09-26 – 2021-10-03 (×11): 15 mL via OROMUCOSAL

## 2021-09-26 MED ORDER — GABAPENTIN 100 MG PO CAPS
100.0000 mg | ORAL_CAPSULE | Freq: Every day | ORAL | Status: DC
  Administered 2021-09-27 – 2021-10-03 (×7): 100 mg via ORAL
  Filled 2021-09-26 (×7): qty 1

## 2021-09-26 MED ORDER — DOCUSATE SODIUM 100 MG PO CAPS: 100.0000 mg | ORAL_CAPSULE | Freq: Two times a day (BID) | ORAL | Status: DC | PRN

## 2021-09-26 MED ORDER — ACETAMINOPHEN 325 MG PO TABS
650.0000 mg | ORAL_TABLET | Freq: Four times a day (QID) | ORAL | Status: DC | PRN
  Administered 2021-09-26 – 2021-10-03 (×7): 650 mg via ORAL
  Filled 2021-09-26 (×8): qty 2

## 2021-09-26 MED ORDER — CALCIUM CARBONATE ANTACID 500 MG PO CHEW
1.0000 | CHEWABLE_TABLET | Freq: Four times a day (QID) | ORAL | Status: DC | PRN
  Administered 2021-09-26: 200 mg via ORAL
  Filled 2021-09-26 (×2): qty 1

## 2021-09-26 MED ORDER — PANTOPRAZOLE SODIUM 40 MG PO TBEC
40.0000 mg | DELAYED_RELEASE_TABLET | Freq: Every day | ORAL | Status: DC
  Administered 2021-09-27 – 2021-10-04 (×8): 40 mg via ORAL
  Filled 2021-09-26 (×8): qty 1

## 2021-09-26 MED ORDER — ASPIRIN 81 MG PO CHEW
81.0000 mg | CHEWABLE_TABLET | Freq: Every day | ORAL | Status: DC
  Administered 2021-09-27 – 2021-10-04 (×8): 81 mg via ORAL
  Filled 2021-09-26 (×8): qty 1

## 2021-09-26 MED ORDER — CHLORHEXIDINE GLUCONATE CLOTH 2 % EX PADS
6.0000 | MEDICATED_PAD | Freq: Every day | CUTANEOUS | Status: DC
  Administered 2021-09-26 – 2021-10-04 (×6): 6 via TOPICAL

## 2021-09-26 MED ORDER — POLYETHYLENE GLYCOL 3350 17 G PO PACK: 17.0000 g | PACK | Freq: Every day | ORAL | Status: DC | PRN

## 2021-09-26 MED ORDER — ALPRAZOLAM 0.25 MG PO TABS
0.2500 mg | ORAL_TABLET | Freq: Three times a day (TID) | ORAL | Status: DC
  Administered 2021-09-26 – 2021-10-04 (×21): 0.25 mg via ORAL
  Filled 2021-09-26 (×23): qty 1

## 2021-09-26 NOTE — Procedures (Signed)
Extubation Procedure Note  Patient Details:   Name: Deborah Jordan DOB: 1933/06/09 MRN: [id number]   Airway Documentation:   Patient extubated per orders. Patient had positive cuff leak prior to extubation. Placed on 5lpm nasal cannula at this time. Tolerating well. Will continue to monitor. RN at bedside. Vent end date: 09/26/21 Vent end time: 1033   Evaluation  O2 sats: stable throughout Complications: No apparent complications Patient did tolerate procedure well. Bilateral Breath Sounds: Clear   Yes  Ander Purpura 09/26/2021, 10:34 AM

## 2021-09-26 NOTE — Progress Notes (Addendum)
NAME:  Deborah Jordan, MRN:  [id number], DOB:  06-17-1933, LOS: 4 ADMISSION DATE:  09/22/2021, CONSULTATION DATE:  09/22/21 REFERRING MD:  Melina Copa CHIEF COMPLAINT:  Abd pain   History of Present Illness:  Deborah Jordan is a 87 y.o. female who has a PMH as outlined below.  She presented to [facility] ED 10/5 with abd pain x 3 - 4 days.  Initially was intermittent then night of 10/4, became constant and worsened with radiation into back.  EMS was called and she was found to be hypotensive in the 80s.  She was brought to ED where she was still hypotensive.  She received 2L fluids without improvement so was started on Levophed.  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  Due to shock, PCCM asked to admit to ICU.  Pertinent  Medical History:  has Degenerative arthritis of hip; Pleural effusion; Hyponatremia; Hypertension; Bilateral chronic knee pain; Chronic bilateral low back pain with bilateral sciatica; Dyspnea and respiratory abnormalities; Septic shock ([place]); AKI (acute kidney injury) ([place]); Hypokalemia; and Transaminitis on their problem list.  Significant Hospital Events: Including procedures, antibiotic start and stop dates in addition to other pertinent events   10/5 > admit 10/6 > Weaned off pressors 10/7 > Tolerated PS 10/8 > Tolerated PS  Interim History / Subjective:   Tolerated PS. Good UOP  Objective:  Blood pressure (!) 101/47, pulse 72, temperature 100 F (37.8 C), temperature source Oral, resp. rate (!) 23, height 4\' 9"  (1.448 m), weight 82.6 kg, SpO2 93 %.    Vent Mode: PSV;CPAP FiO2 (%):  [40 %] 40 % Set Rate:  [20 bmp] 20 bmp Vt Set:  [370 mL] 370 mL PEEP:  [5 cmH20] 5 cmH20 Pressure Support:  [8 cmH20-10 cmH20] 10 cmH20 Plateau Pressure:  [16 cmH20-22 cmH20] 16 cmH20   Intake/Output Summary (Last  24 hours) at 09/26/2021 1006 Last data filed at 09/26/2021 0900 Gross per 24 hour  Intake 2161.08 ml  Output 3035 ml  Net -873.92 ml   Filed Weights   09/24/21 0500 09/25/21 0500 09/26/21 0500  Weight: 87.5 kg 84.3 kg 82.6 kg    Physical Exam: General: Elderly, well-appearing, no acute distress HENT: [place], AT, ETT in place Eyes: EOMI, no scleral icterus Respiratory: Clear to auscultation bilaterally.  No crackles, wheezing or rales Cardiovascular: RRR, -M/R/G, no JVD GI: BS+, soft, nontender Extremities:-Edema,-tenderness Neuro: Awake and alert, CNII-XII grossly intact, moves extremities x 4 Psych: Normal mood, normal affect GU: Foley in place  Labs/imaging personally reviewed:  CTA chest/abd/pelv 10/5 >  neg for dissection or aneurysm but showed lare amount of atherosclerotic plaque with subtotal occlusion involving the origin and proximal 1.3cm of celiac artery with collateral supply from the SMA.  Echo 09/24/21 - EF normal. Mitral valve prolapse - new compared to 2021. No vegetations  Assessment & Plan:  Septic shock to polymicrobial bacteremia (E.Coli and Strep infantarious), POA - shock resolved In setting of bacteremia due to E. Coli and Strep infantarius. Echo neg for vegetations. Possibly due to gut translocation given her underlying vascular issues. Differential also includes cardiogenic shock. Improving leukocytosis -Appreciate ID involvement. Continue Ceftriaxone + metronidazole -Repeat Bcx sent on 10/7 for bacteremia clearance. NGTD x 1 day  Acute hypoxemic respiratory failure secondary to critical illness Pulmonary edema -Full vent support. Wean FIO2/PEEP for goal SpO2 88-95% -SBT/WUA  daily. Plan to extubate today -BiPAP as needed and nightly -Speech/PT ordered -VAP -Brovana -PAD protocol for RASS goal 0: Precedex -Hold diuresis today due to low-normal pressures  AKI - resolved Hypokalemia. Hypomag - Trend BMET/Mag - Foley  Transaminitis - presumed shock  liver in setting of shock - improving - Trend LFT's  Subtotal occlusion of celiac artery. - Supportive care. - Continue home ASA. - No signs of acute abdomen, will consider vascular surgery consult if needed.  Hx CAD, HTN, HLD, dCHF, LVH, RBB. Mild MV regurg with prolapse - Continue home ASA. - Hold home Amlodipine, Carvedilol, Furosemide - Consider Cardiology consult inpatient vs outpatient  Hx anxiety. - Hold home Duloxetine, Gabapentin. - PRN ativan  Best practice (evaluated daily):  Diet/type: tubefeeds DVT prophylaxis: prophylactic heparin  GI prophylaxis: N/A and PPI Lines: Central line Foley:  N/A Code Status:  full code Last date of multidisciplinary goals of care discussion: None.  Labs   Labs reviewed - Improving leukocytosis, stable anemia, hypomag, improving LFTs  CBC: Recent Labs  Lab 09/22/21 0811 09/22/21 0831 09/22/21 1359 09/23/21 0343 09/24/21 0428 09/26/21 0220  WBC 33.8*  --   --  46.3* 38.6* 31.2*  NEUTROABS 29.7*  --   --   --   --   --   HGB 9.6* 10.2* 10.9* 10.3* 9.8* 10.0*  HCT 29.6* 30.0* 32.0* 30.0* 28.9* 29.7*  MCV 90.0  --   --  85.5 84.8 83.7  PLT 243  --   --  223 208 166    Basic Metabolic Panel: Recent Labs  Lab 09/23/21 0343 09/23/21 0905 09/23/21 1130 09/23/21 2005 09/24/21 0428 09/24/21 1313 09/25/21 0508 09/25/21 1824 09/26/21 0220  NA 131*  --   --   --  131* 133* 136 134* 137  K 4.3  --   --   --  3.2* 3.7 3.3* 3.6 3.7  CL 101  --   --   --  101 101 102 101 101  CO2 19*  --   --   --  21* 25 25 23 25   GLUCOSE 116*  --   --   --  113* 119* 144* 154* 113*  BUN 20  --   --   --  34* 32* 30* 27* 28*  CREATININE 1.11*  --   --   --  1.02* 1.01* 0.88 0.80 0.98  CALCIUM 8.4*  --   --   --  8.5* 8.5* 8.3* 8.1* 8.2*  MG 1.2* 2.5* 2.7* 2.4  --  2.0  --   --  1.4*  PHOS 4.4  --   --   --   --   --   --   --   --    GFR: Estimated Creatinine Clearance: 36.6 mL/min (by C-G formula based on SCr of 0.98 mg/dL). Recent  Labs  Lab 09/22/21 0811 09/22/21 0858 09/22/21 1624 09/22/21 1955 09/23/21 0343 09/23/21 0905 09/23/21 1126 09/24/21 0428 09/26/21 0220  WBC 33.8*  --   --   --  46.3*  --   --  38.6* 31.2*  LATICACIDVEN  --    < > 4.1* 3.9*  --  2.3* 2.1*  --   --    < > = values in this interval not displayed.    Liver Function Tests: Recent Labs  Lab 09/22/21 0811 09/23/21 0343 09/26/21 0220  AST 468* 240* 33  ALT 499* 370* 114*  ALKPHOS 186* 153* 191*  BILITOT 2.3* 1.4* 0.8  PROT 5.1* 5.1* 5.1*  ALBUMIN 2.8* 2.4* 2.2*   CBG: Recent Labs  Lab 09/25/21 1540 09/25/21 1935 09/25/21 2316 09/26/21 0319 09/26/21 0736  GLUCAP 128* 126* 86 113* 115*    Critical care time:  46 min.   The patient is critically ill with multiple organ systems failure and requires high complexity decision making for assessment and support, frequent evaluation and titration of therapies, application of advanced monitoring technologies and extensive interpretation of multiple databases.  Independent Critical Care Time: 58 Minutes.   Rodman Pickle, M.D. [facility] Pulmonary/Critical Care Medicine 09/26/2021 10:10 AM   Please see Amion for pager number to reach on-call Pulmonary and Critical Care Team.

## 2021-09-26 NOTE — Progress Notes (Signed)
Placed pt on BIPAP for QHS use.

## 2021-09-26 NOTE — Progress Notes (Signed)
[facility] ADULT ICU REPLACEMENT PROTOCOL   The patient does apply for the [facility] Adult ICU Electrolyte Replacment Protocol based on the criteria listed below:   1.Exclusion criteria: TCTS patients, ECMO patients and Hypothermia Protocol, and   Dialysis patients 2. Is GFR >/= 30 ml/min? Yes.    Patient's GFR today is 56 3. Is SCr </= 2? Yes.   Patient's SCr is 0.98 mg/dL 4. Did SCr increase >/= 0.5 in 24 hours? No. 5.Pt's weight >40kg  Yes.   6. Abnormal electrolyte(s): K+3.7, Mag 1.4  7. Electrolytes replaced per protocol 8.  Call MD STAT for K+ </= 2.5, Phos </= 1, or Mag </= 1 Physician:  Dr. Radene Knee, [place] Grumbling 09/26/2021 4:50 AM

## 2021-09-26 NOTE — Evaluation (Signed)
Clinical/Bedside Swallow Evaluation Patient Details  Name: Deborah Jordan MRN: [id number] Date of Birth: 11/10/1933  Today's Date: 09/26/2021 Time: SLP Start Time (ACUTE ONLY): 41 SLP Stop Time (ACUTE ONLY): 1655 SLP Time Calculation (min) (ACUTE ONLY): 20 min  Past Medical History:  Past Medical History:  Diagnosis Date   Anxiety    Arthritis    osteoarthritis. spinal stenosis. Scoliosis of spine-degenerative spine.   Bilateral cataracts    CAD (coronary artery disease)    minimal, improved on right 06/2016   Chronic kidney disease    STAGE 4   DDD (degenerative disc disease), lumbar    Dyspnea    Elevated cholesterol    GERD (gastroesophageal reflux disease)    controls with Nexium   Grade I diastolic dysfunction 07/86/7544   Noted on ECHO   History of cardiomegaly    History of gallstones    Hypertension    LVH (left ventricular hypertrophy) 05/04/2017   Mil, noted on ECHO   Mild depression    Pre-diabetes    RBBB (right bundle branch block)    Spinal stenosis    Tubular adenoma    and benign polyps   Vitamin D deficiency    Wears partial dentures    Past Surgical History:  Past Surgical History:  Procedure Laterality Date   CATARACT EXTRACTION Bilateral 03/06/2013   CHOLECYSTECTOMY     COLONOSCOPY     CRYOTHERAPY     DILATION AND CURETTAGE OF UTERUS     esi     HEMORRHOID SURGERY N/A 09/21/2018   Procedure: SINGLE COLUMN HEMORRHOIDECTOMY, HEMORRHOIDPEXY;  Surgeon: Leighton Ruff, MD;  Location: [facility];  Service: General;  Laterality: N/A;   TOTAL HIP ARTHROPLASTY Left 03/08/2013   Procedure: LEFT TOTAL HIP ARTHROPLASTY ANTERIOR APPROACH;  Surgeon: Mcarthur Rossetti, MD;  Location: [facility];  Service: Orthopedics;  Laterality: Left;   HPI:  Patient is an 87 y.o. female with PMH:  Degenerative arthritis of hip; Pleural effusion; Hyponatremia; Hypertension; Bilateral chronic knee pain; Chronic bilateral low back pain with bilateral  sciatica; Dyspnea and respiratory abnormalities; and Septic shock. She presented to [facility] ED on 10/5 after 3-4 days of abdominal pain which initially was intermittent but became constant and worsened in severity with radiation into back. EMS called and she was found to be hypotensive in the 80's. In ED she received 2L fluids without improvement and started on Levophed. CXR revealed pulmonary edema and small bilateral pleural effusions. She was intubated on day of admission and extubated on 10/9 to supplemental oxygen via nasal cannula.    Assessment / Plan / Recommendation  Clinical Impression  Patient presents with clinical s/s of a post-extubation dysphagia. At baseline, patient has hoarse sounding voice and has been coughing periodically since being extubated this morning. During PO intake of thin liquids (water) via straw sips patient appeared with timely swallow response and no observed changes in vitals or vocal quality. She did exhibit an intermittent and inconsistent cough response during PO intake of liquids. SLP is recommending to initiate full liquids (thin) diet and continued ST intervention to ensure diet toleration and readiness for diet consistency upgrade. SLP Visit Diagnosis: Dysphagia, unspecified (R13.10)    Aspiration Risk  Mild aspiration risk    Diet Recommendation Thin liquid;Other (Comment) (full liquids)   Liquid Administration via: Cup;Straw Medication Administration: Whole meds with puree Supervision: Patient able to self feed;Staff to assist with self feeding Compensations: Slow rate;Small sips/bites Postural Changes: Seated upright at 90 degrees  Other  Recommendations Oral Care Recommendations: Oral care BID    Recommendations for follow up therapy are one component of a multi-disciplinary discharge planning process, led by the attending physician.  Recommendations may be updated based on patient status, additional functional criteria and insurance  authorization.  Follow up Recommendations None      Frequency and Duration min 2x/week  1 week       Prognosis Prognosis for Safe Diet Advancement: Good      Swallow Study   General Date of Onset: 09/22/21 HPI: Patient is an 87 y.o. female with PMH:  Degenerative arthritis of hip; Pleural effusion; Hyponatremia; Hypertension; Bilateral chronic knee pain; Chronic bilateral low back pain with bilateral sciatica; Dyspnea and respiratory abnormalities; and Septic shock. She presented to [facility] ED on 10/5 after 3-4 days of abdominal pain which initially was intermittent but became constant and worsened in severity with radiation into back. EMS called and she was found to be hypotensive in the 80's. In ED she received 2L fluids without improvement and started on Levophed. CXR revealed pulmonary edema and small bilateral pleural effusions. She was intubated on day of admission and extubated on 10/9 to supplemental oxygen via nasal cannula. Type of Study: Bedside Swallow Evaluation Previous Swallow Assessment: None found Diet Prior to this Study: NPO Temperature Spikes Noted: No Respiratory Status: Nasal cannula History of Recent Intubation: Yes Length of Intubations (days): 5 days Date extubated: 09/26/21 Behavior/Cognition: Alert;Cooperative;Pleasant mood Oral Cavity Assessment: Other (comment) (mildly dry and sticky with trace oral secretions) Oral Care Completed by SLP: Yes Oral Cavity - Dentition: Adequate natural dentition Vision: Functional for self-feeding Self-Feeding Abilities: Needs set up;Able to feed self Patient Positioning: Upright in bed Baseline Vocal Quality: Hoarse Volitional Cough: Strong Volitional Swallow: Able to elicit    Oral/Motor/Sensory Function Overall Oral Motor/Sensory Function: Within functional limits   Ice Chips Ice chips: Within functional limits   Thin Liquid Thin Liquid: Impaired Presentation: Straw Pharyngeal  Phase Impairments: Other  (comments) Other Comments: Patient did exhibit intermittent, inconsistent hoarse non-productive cough    Nectar Thick     Honey Thick     Puree Puree: Within functional limits   Solid     Solid: Not tested     Sonia Baller, MA, CCC-SLP Speech Therapy

## 2021-09-27 DIAGNOSIS — R7881 Bacteremia: Secondary | ICD-10-CM

## 2021-09-27 DIAGNOSIS — R6521 Severe sepsis with septic shock: Secondary | ICD-10-CM | POA: Diagnosis not present

## 2021-09-27 DIAGNOSIS — A419 Sepsis, unspecified organism: Secondary | ICD-10-CM | POA: Diagnosis not present

## 2021-09-27 LAB — GLUCOSE, CAPILLARY
Glucose-Capillary: 101 mg/dL — ABNORMAL HIGH (ref 70–99)
Glucose-Capillary: 109 mg/dL — ABNORMAL HIGH (ref 70–99)
Glucose-Capillary: 125 mg/dL — ABNORMAL HIGH (ref 70–99)
Glucose-Capillary: 81 mg/dL (ref 70–99)
Glucose-Capillary: 81 mg/dL (ref 70–99)
Glucose-Capillary: 99 mg/dL (ref 70–99)

## 2021-09-27 MED ORDER — CEFAZOLIN SODIUM-DEXTROSE 2-4 GM/100ML-% IV SOLN
2.0000 g | Freq: Three times a day (TID) | INTRAVENOUS | Status: DC
  Administered 2021-09-28 – 2021-09-29 (×3): 2 g via INTRAVENOUS
  Filled 2021-09-27 (×5): qty 100

## 2021-09-27 MED ORDER — ENSURE ENLIVE PO LIQD
237.0000 mL | Freq: Two times a day (BID) | ORAL | Status: DC
  Administered 2021-09-27 – 2021-10-03 (×11): 237 mL via ORAL

## 2021-09-27 MED ORDER — GUAIFENESIN 100 MG/5ML PO SOLN
5.0000 mL | ORAL | Status: DC | PRN
  Administered 2021-09-27 (×2): 100 mg via ORAL
  Filled 2021-09-27 (×4): qty 5

## 2021-09-27 NOTE — Evaluation (Signed)
Physical Therapy Evaluation Patient Details Name: Deborah Jordan MRN: [id number] DOB: January 14, 1933 Today's Date: 09/27/2021  History of Present Illness  pt is an 87 y/o female admitted 10/5 with intermittent abdominal pain, nausea and vomiting for 4 days.  Pt noted to be severely hypotensive requiring aggressive IV fluid resuscitation.  PMHx: HTN, bil chronic knee pain, deg arthritis of hip, bil sciaticaLVH, CAD, CKD4.  Clinical Impression  Pt admitted with/for abdominal pain, n/v and hypotensive.  Pt presently at a mod assist level for mobility.  Pt currently limited functionally due to the problems listed below.  (see problems list.)  Pt will benefit from PT to maximize function and safety to be able to get home safely with available assist.        Recommendations for follow up therapy are one component of a multi-disciplinary discharge planning process, led by the attending physician.  Recommendations may be updated based on patient status, additional functional criteria and insurance authorization.  Follow Up Recommendations Home health PT;Supervision/Assistance - 24 hour;Supervision - Intermittent    Equipment Recommendations  None recommended by PT;Other (comment) (TBA further)    Recommendations for Other Services       Precautions / Restrictions Precautions Precautions: Fall      Mobility  Bed Mobility Overal bed mobility: Needs Assistance Bed Mobility: Sit to Sidelying         Sit to sidelying: Mod assist General bed mobility comments: pt had difficulty controlling descent to sidelying.  needed LE's assisted into bed.    Transfers Overall transfer level: Needs assistance   Transfers: Sit to/from Stand;Stand Pivot Transfers Sit to Stand: Mod assist Stand pivot transfers: Mod assist       General transfer comment: cues for hand placement, face to face assist to stand with pt bear hugging therapist during pivot and back up to the bed.  Ambulation/Gait                 Stairs            Wheelchair Mobility    Modified Rankin (Stroke Patients Only)       Balance Overall balance assessment: Needs assistance Sitting-balance support: Bilateral upper extremity supported;No upper extremity supported;Feet supported Sitting balance-Leahy Scale: Fair       Standing balance-Leahy Scale: Poor Standing balance comment: needs external support.                             Pertinent Vitals/Pain Pain Assessment: Faces Faces Pain Scale: No hurt    Home Living Family/patient expects to be discharged to:: Private residence Living Arrangements: Children Available Help at Discharge: Family;Available 24 hours/day;Available PRN/intermittently Type of Home: House Home Access: Stairs to enter Entrance Stairs-Rails: Right;Left Entrance Stairs-Number of Steps: 4 Home Layout: Two level;Able to live on main level with bedroom/bathroom Home Equipment: Walker - 4 wheels;Bedside commode;Tub bench      Prior Function Level of Independence: Independent with assistive device(s)               Hand Dominance   Dominant Hand: Right    Extremity/Trunk Assessment   Upper Extremity Assessment Upper Extremity Assessment: Generalized weakness    Lower Extremity Assessment Lower Extremity Assessment: Generalized weakness    Cervical / Trunk Assessment Cervical / Trunk Assessment: Kyphotic  Communication   Communication: No difficulties  Cognition Arousal/Alertness: Awake/alert Behavior During Therapy: WFL for tasks assessed/performed Overall Cognitive Status: Within Functional Limits for tasks assessed (pt states  mentation not normal, slowed processing, but functional for evaluation.)                                        General Comments General comments (skin integrity, edema, etc.): vss on 4L [place], sats at 93-95%    Exercises     Assessment/Plan    PT Assessment Patient needs continued PT services   PT Problem List Decreased strength;Decreased activity tolerance;Decreased balance;Decreased mobility;Cardiopulmonary status limiting activity       PT Treatment Interventions Gait training;DME instruction;Functional mobility training;Stair training;Therapeutic activities;Balance training;Patient/family education    PT Goals (Current goals can be found in the Care Plan section)  Acute Rehab PT Goals Patient Stated Goal: get back home, PLOF Time For Goal Achievement: 10/11/21 Potential to Achieve Goals: Good    Frequency Min 3X/week   Barriers to discharge        Co-evaluation               AM-PAC PT "6 Clicks" Mobility  Outcome Measure Help needed turning from your back to your side while in a flat bed without using bedrails?: A Lot Help needed moving from lying on your back to sitting on the side of a flat bed without using bedrails?: A Lot Help needed moving to and from a bed to a chair (including a wheelchair)?: A Lot Help needed standing up from a chair using your arms (e.g., wheelchair or bedside chair)?: A Lot Help needed to walk in hospital room?: Total Help needed climbing 3-5 steps with a railing? : Total 6 Click Score: 10    End of Session     Patient left: in bed;with call bell/phone within reach;with bed alarm set;with nursing/sitter in room Nurse Communication: Mobility status PT Visit Diagnosis: Other abnormalities of gait and mobility (R26.89);Muscle weakness (generalized) (M62.81);Difficulty in walking, not elsewhere classified (R26.2)    Time: 6333-5456 PT Time Calculation (min) (ACUTE ONLY): 34 min   Charges:   PT Evaluation $PT Eval Moderate Complexity: 1 Mod PT Treatments $Therapeutic Activity: 8-22 mins        09/27/2021  Ginger Carne., PT Acute Rehabilitation Services [phone]  (pager) [phone]  (office)  Tessie Fass Miyana Mordecai 09/27/2021, 1:44 PM

## 2021-09-27 NOTE — Progress Notes (Signed)
Speech Language Pathology Treatment: Dysphagia  Patient Details Name: Deborah Jordan MRN: [id number] DOB: 07-23-1933 Today's Date: 09/27/2021 Time: 8937-3428 SLP Time Calculation (min) (ACUTE ONLY): 15 min  Assessment / Plan / Recommendation Clinical Impression  After lift to chair this am pt has been continually coughing. Cough is congested and minimally productive. Coughing present prior to and after swallows. Vocal quality is clear today. Strongly suspect pt is protecting airway but is working on clearing congestion. Will allow pt to continue drinking thins but follow up for tolerance again. Pt ay also advance to regular solids as mastication is WNL. Pt does not eat red meat.   HPI HPI: Patient is an 87 y.o. female with PMH:  Degenerative arthritis of hip; Pleural effusion; Hyponatremia; Hypertension; Bilateral chronic knee pain; Chronic bilateral low back pain with bilateral sciatica; Dyspnea and respiratory abnormalities; and Septic shock. She presented to [facility] ED on 10/5 after 3-4 days of abdominal pain which initially was intermittent but became constant and worsened in severity with radiation into back. EMS called and she was found to be hypotensive in the 80's. In ED she received 2L fluids without improvement and started on Levophed. CXR revealed pulmonary edema and small bilateral pleural effusions. She was intubated on day of admission and extubated on 10/9 to supplemental oxygen via nasal cannula.      SLP Plan  Continue with current plan of care      Recommendations for follow up therapy are one component of a multi-disciplinary discharge planning process, led by the attending physician.  Recommendations may be updated based on patient status, additional functional criteria and insurance authorization.    Recommendations  Diet recommendations: Regular;Thin liquid Liquids provided via: Cup;Straw Medication Administration: Whole meds with liquid Supervision: Patient able to self  feed Compensations: Slow rate;Small sips/bites Postural Changes and/or Swallow Maneuvers: Seated upright 90 degrees                Oral Care Recommendations: Oral care BID Follow up Recommendations: None SLP Visit Diagnosis: Dysphagia, unspecified (R13.10) Plan: Continue with current plan of care       GO                Jonnette Nuon, Katherene Ponto  09/27/2021, 9:54 AM

## 2021-09-27 NOTE — Progress Notes (Signed)
Patient is frequently coughing. BIPAP not placed at this time.

## 2021-09-27 NOTE — Progress Notes (Signed)
Initial Nutrition Assessment  DOCUMENTATION CODES:   Obesity unspecified  INTERVENTION:   Ensure Enlive po BID, each supplement provides 350 kcal and 20 grams of protein  NUTRITION DIAGNOSIS:   Inadequate oral intake related to inability to eat as evidenced by NPO status.  Ongoing  GOAL:   Patient will meet greater than or equal to 90% of their needs  Progressing  MONITOR:   Vent status, Labs, TF tolerance  REASON FOR ASSESSMENT:   Rounds    ASSESSMENT:   87 yo female admitted with shock in the setting of bacteremia d/t E. Coli and Strep infantarius, hypotension. PMH includes HTN, GERD, cardiomegaly, DDD, spinal stenosis, LVH, CKD, HLD, vitamin D deficiency, CAD.  Discussed patient in ICU rounds and with RN today. Extubated 10/9. S/P swallow evaluation with SLP, diet to be upgraded to regular today.   Spoke with patient and her son at bedside. She c/o coughing a lot. Per discussion with RN, SLP is aware of the coughing and does not think it is related to her swallowing function. Patient is nervous to eat or drink anything because when she coughs it feels like she is strangling.   Labs reviewed.  CBG: 81-81-99  Medications reviewed and include Protonix, IV antibiotics.  I/O +3.6 L since admission. Admission weight 83.6 kg (10/5) Current weight 82.6 kg (10/9)  NUTRITION - FOCUSED PHYSICAL EXAM: Flowsheet Row Most Recent Value  Orbital Region No depletion  Upper Arm Region No depletion  Thoracic and Lumbar Region No depletion  Buccal Region No depletion  Temple Region No depletion  Clavicle Bone Region No depletion  Clavicle and Acromion Bone Region No depletion  Scapular Bone Region No depletion  Dorsal Hand No depletion  Patellar Region No depletion  Anterior Thigh Region No depletion  Posterior Calf Region No depletion  Edema (RD Assessment) Mild  Hair Reviewed  Eyes Reviewed  Mouth Reviewed  Skin Reviewed  Nails Reviewed      Diet Order:    Diet Order             Diet regular Room service appropriate? Yes; Fluid consistency: Thin  Diet effective now                   EDUCATION NEEDS:   Not appropriate for education at this time  Skin:  Skin Assessment: Reviewed RN Assessment (MASD to thigh)  Last BM:  10/10 rectal tube  Height:   Ht Readings from Last 1 Encounters:  09/22/21 4\' 9"  (1.448 m)    Weight:   Wt Readings from Last 1 Encounters:  09/26/21 82.6 kg    BMI:  Body mass index is 39.41 kg/m.  Estimated Nutritional Needs:   Kcal:  1450-1650  Protein:  85-95 gm  Fluid:  >/= 1.5 L    Lucas Mallow, RD, LDN, CNSC Please refer to Amion for contact information.

## 2021-09-27 NOTE — Progress Notes (Signed)
    [facility] Infectious [facility]    Date of Admission:  09/22/2021      ID: Deborah Jordan is a 87 y.o. female with e.coli and infantarius bacteremia, presumably GI source.  Active Problems:   Septic shock (HCC)   AKI (acute kidney injury) ([place])   Hypokalemia   Transaminitis    Subjective: Extubated over the weekend On 3-4 liters o2 via [place] Normal hemodynamics No f/c Rectal bag remains with more formed green stool  10/07 repeat bcx negative  Medications:   ALPRAZolam  0.25 mg Oral TID   arformoterol  15 mcg Nebulization BID   aspirin  81 mg Oral Daily   chlorhexidine  15 mL Mouth Rinse BID   Chlorhexidine Gluconate Cloth  6 each Topical Q0600   feeding supplement  237 mL Oral BID BM   gabapentin  100 mg Oral QHS   heparin  5,000 Units Subcutaneous Q8H   mouth rinse  15 mL Mouth Rinse q12n4p   pantoprazole  40 mg Oral Daily   revefenacin  175 mcg Nebulization Daily    Objective: Vital signs in last 24 hours: Temp:  [98.6 F (37 C)-99 F (37.2 C)] 98.7 F (37.1 C) (10/10 1131) Pulse Rate:  [75-100] 85 (10/10 1100) Resp:  [15-31] 19 (10/10 1100) BP: (101-157)/(37-101) 143/56 (10/10 1100) SpO2:  [90 %-99 %] 93 % (10/10 1100) Physical Exam  General/constitutional: no distress, pleasant, conversant, wet cough; sitting in chair HEENT: Normocephalic, PER, Conj Clear, EOMI, Oropharynx clear Neck supple CV: rrr no mrg Lungs: clear to auscultation, normal respiratory effort Abd: Soft, Nontender Ext: no edema Skin: No Rash Neuro: nonfocal MSK: no peripheral joint swelling/tenderness/warmth; back spines nontender     Lab Results Recent Labs    09/26/21 0220 09/26/21 1043  WBC 31.2* 26.3*  HGB 10.0* 9.8*  HCT 29.7* 28.7*  NA 137 137  K 3.7 4.0  CL 101 101  CO2 25 25  BUN 28* 31*  CREATININE 0.98 1.01*   Liver Panel Recent Labs    09/26/21 0220  PROT 5.1*  ALBUMIN 2.2*  AST 33  ALT 114*  ALKPHOS 191*  BILITOT 0.8   Sedimentation Rate No  results for input(s): ESRSEDRATE in the last 72 hours. C-Reactive Protein No results for input(s): CRP in the last 72 hours.  Microbiology: Repeat blood cx NGTD on day 0 Blood cx 10/5 = ecoli and strep infantarius Studies/Results: No results found.   Assessment/Plan: Polymicrobial bacteremia (strep bovis family and ecoli). Suspect translocation from gut  abd ct no abscess. Questionable celiac trunk thrombus Repeat bcx negative Echo no evidence endocarditis  Clinically much improved.    -finish total 14 days abx on 10/21; treating long due to concern of thrombus being a complicating factor -transition all abx to cefazolin iv. In another 1-2 days if taking good PO can transition to oral cefadroxil a day to finish course of abx -discussed with primary team -will sign off   I spent more than 35 minute reviewing data/chart, and coordinating care and >50% direct face to face time providing counseling/discussing diagnostics/treatment plan with patient    Tiney Rouge for Infectious Diseases  09/27/2021, 12:41 PM

## 2021-09-27 NOTE — Progress Notes (Signed)
NAME:  Deborah Jordan, MRN:  [id number], DOB:  [birth year], LOS: 5 ADMISSION DATE:  09/22/2021, CONSULTATION DATE:  09/22/21 REFERRING MD:  Melina Copa CHIEF COMPLAINT:  Abd pain   History of Present Illness:  Deborah Jordan is a 87 y.o. female who has a PMH as outlined below.  She presented to [facility] ED 10/5 with abd pain x 3 - 4 days.  Initially was intermittent then night of 10/4, became constant and worsened with radiation into back.  EMS was called and she was found to be hypotensive in the 80s.  She was brought to ED where she was still hypotensive.  She received 2L fluids without improvement so was started on Levophed.  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  Due to shock, PCCM asked to admit to ICU.  Pertinent  Medical History:  has Degenerative arthritis of hip; Pleural effusion; Hyponatremia; Hypertension; Bilateral chronic knee pain; Chronic bilateral low back pain with bilateral sciatica; Dyspnea and respiratory abnormalities; Septic shock ([place]); AKI (acute kidney injury) ([place]); Hypokalemia; Transaminitis; and Bacteremia on their problem list.  Significant Hospital Events: Including procedures, antibiotic start and stop dates in addition to other pertinent events   10/5 > admit 10/6 > Weaned off pressors 10/7 > Tolerated PS 10/8 > Tolerated PS  Interim History / Subjective:  10/10: doing well. Instructed on yonker use to help with mucous clearance. Pt with strong cough and certainly productive. Son at bedside and updated as well.    Objective:  Blood pressure (!) 143/56, pulse 85, temperature 98.7 F (37.1 C), temperature source Oral, resp. rate 19, height 4\' 9"  (1.448 m), weight 82.6 kg, SpO2 93 %.        Intake/Output Summary (Last 24 hours) at 09/27/2021 1313 Last data filed at 09/27/2021 1100 Gross  per 24 hour  Intake 1320.86 ml  Output 675 ml  Net 645.86 ml   Filed Weights   09/24/21 0500 09/25/21 0500 09/26/21 0500  Weight: 87.5 kg 84.3 kg 82.6 kg    Physical Exam: General: Elderly, well-appearing, no acute distress oob in chair HENT: NCAT, mmmp Eyes: EOMI, perrla no scleral icterus Respiratory:diminished in b/l bases. 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  Echo 09/24/21 - EF normal. Mitral valve prolapse - new compared to 2021. No vegetations  Assessment & Plan:  Septic shock to polymicrobial bacteremia (E.Coli and Strep infantarious), POA - shock resolved In setting of bacteremia due to E. Coli and Strep infantarius. Echo neg for vegetations. Possibly due to gut translocation given her underlying vascular issues. Differential also includes cardiogenic shock. Improving leukocytosis -Appreciate ID involvement. Continue Ceftriaxone + metronidazole -Repeat Bcx sent on 10/7 for bacteremia clearance. NGTD x 1 day  Acute hypoxemic respiratory failure secondary to critical illness Pulmonary edema -titrate supplemental oxygen on 4L at this time.  -BiPAP as needed and nightly -Speech/PT ordered -encourage pulm hygiene -[place]  AKI - resolved Hypokalemia. Hypomag - Trend BMET/Mag -  Foley ok to remove per protocol  Transaminitis - presumed shock liver in setting of shock - improving - Trend LFT's  Subtotal occlusion of celiac artery. - Supportive care. - Continue home ASA. - No signs of acute  abdomen, will consider vascular surgery consult if needed.  Hx CAD, HTN, HLD, dCHF, LVH, RBB. Mild MV regurg with prolapse - Continue home ASA. - Hold home Amlodipine, Carvedilol, Furosemide - Consider Cardiology consult inpatient vs outpatient  Hx anxiety. - Hold home Duloxetine, Gabapentin. - PRN ativan  Best practice (evaluated daily):  Diet/type: Regular consistency (see orders) DVT prophylaxis: prophylactic heparin  GI prophylaxis: N/A and PPI Lines: Central line Foley:  N/A Code Status:  full code Last date of multidisciplinary goals of care discussion: 10/10 with son and pt. Will transfer from ICU to progressive. Have asked TRH to assume care. We will sign off.   Labs   Labs reviewed - Improving leukocytosis, stable anemia, hypomag, improving LFTs  CBC: Recent Labs  Lab 09/22/21 0811 09/22/21 0831 09/22/21 1359 09/23/21 0343 09/24/21 0428 09/26/21 0220 09/26/21 1043  WBC 33.8*  --   --  46.3* 38.6* 31.2* 26.3*  NEUTROABS 29.7*  --   --   --   --   --   --   HGB 9.6*   < > 10.9* 10.3* 9.8* 10.0* 9.8*  HCT 29.6*   < > 32.0* 30.0* 28.9* 29.7* 28.7*  MCV 90.0  --   --  85.5 84.8 83.7 84.7  PLT 243  --   --  223 208 220 226   < > = values in this interval not displayed.    Basic Metabolic Panel: Recent Labs  Lab 09/23/21 0343 09/23/21 0905 09/23/21 1130 09/23/21 2005 09/24/21 0428 09/24/21 1313 09/25/21 0508 09/25/21 1824 09/26/21 0220 09/26/21 1043  NA 131*  --   --   --    < > 133* 136 134* 137 137  K 4.3  --   --   --    < > 3.7 3.3* 3.6 3.7 4.0  CL 101  --   --   --    < > 101 102 101 101 101  CO2 19*  --   --   --    < > 25 25 23 25 25   GLUCOSE 116*  --   --   --    < > 119* 144* 154* 113* 165*  BUN 20  --   --   --    < > 32* 30* 27* 28* 31*  CREATININE 1.11*  --   --   --    < > 1.01* 0.88 0.80 0.98 1.01*  CALCIUM 8.4*  --   --   --    < > 8.5* 8.3* 8.1* 8.2* 8.3*  MG 1.2* 2.5* 2.7* 2.4  --  2.0  --   --  1.4*  --   PHOS 4.4  --   --   --   --    --   --   --   --   --    < > = values in this interval not displayed.   GFR: Estimated Creatinine Clearance: 35.5 mL/min (A) (by C-G formula based on SCr of 1.01 mg/dL (H)). Recent Labs  Lab 09/22/21 1624 09/22/21 1955 09/23/21 0343 09/23/21 0905 09/23/21 1126 09/24/21 0428 09/26/21 0220 09/26/21 1043  WBC  --   --  46.3*  --   --  38.6* 31.2* 26.3*  LATICACIDVEN 4.1*  3.9*  --  2.3* 2.1*  --   --   --     Liver Function Tests: Recent Labs  Lab 09/22/21 0811 09/23/21 0343 09/26/21 0220  AST 468* 240* 33  ALT 499* 370* 114*  ALKPHOS 186* 153* 191*  BILITOT 2.3* 1.4* 0.8  PROT 5.1* 5.1* 5.1*  ALBUMIN 2.8* 2.4* 2.2*   CBG: Recent Labs  Lab 09/26/21 1935 09/26/21 2325 09/27/21 0337 09/27/21 0749 09/27/21 1130  GLUCAP 99 101* 81 81 99    Critical care time: The patient is critically ill with multiple organ systems failure and requires high complexity decision making for assessment and support, frequent evaluation and titration of therapies, application of advanced monitoring technologies and extensive interpretation of multiple databases.  Critical care time 38 mins. This represents my time independent of the NPs time taking care of the pt. This is excluding procedures.    Audria Nine DO [facility] 09/27/2021, 1:13 PM See Amion for pager If no response to pager, please call [phone] until 1900 After 1900 please call [facility] [phone]

## 2021-09-28 DIAGNOSIS — Z8249 Family history of ischemic heart disease and other diseases of the circulatory system: Secondary | ICD-10-CM

## 2021-09-28 DIAGNOSIS — A419 Sepsis, unspecified organism: Secondary | ICD-10-CM | POA: Diagnosis not present

## 2021-09-28 DIAGNOSIS — I771 Stricture of artery: Secondary | ICD-10-CM

## 2021-09-28 DIAGNOSIS — Z833 Family history of diabetes mellitus: Secondary | ICD-10-CM

## 2021-09-28 DIAGNOSIS — E669 Obesity, unspecified: Secondary | ICD-10-CM

## 2021-09-28 DIAGNOSIS — R6521 Severe sepsis with septic shock: Secondary | ICD-10-CM | POA: Diagnosis not present

## 2021-09-28 LAB — CBC
HCT: 33.3 % — ABNORMAL LOW (ref 36.0–46.0)
Hemoglobin: 10.9 g/dL — ABNORMAL LOW (ref 12.0–15.0)
MCH: 28.5 pg (ref 26.0–34.0)
MCHC: 32.7 g/dL (ref 30.0–36.0)
MCV: 87.2 fL (ref 80.0–100.0)
Platelets: 343 10*3/uL (ref 150–400)
RBC: 3.82 MIL/uL — ABNORMAL LOW (ref 3.87–5.11)
RDW: 14.9 % (ref 11.5–15.5)
WBC: 20.7 10*3/uL — ABNORMAL HIGH (ref 4.0–10.5)
nRBC: 0 % (ref 0.0–0.2)

## 2021-09-28 LAB — BASIC METABOLIC PANEL
Anion gap: 8 (ref 5–15)
BUN: 11 mg/dL (ref 8–23)
CO2: 29 mmol/L (ref 22–32)
Calcium: 8.8 mg/dL — ABNORMAL LOW (ref 8.9–10.3)
Chloride: 97 mmol/L — ABNORMAL LOW (ref 98–111)
Creatinine, Ser: 0.75 mg/dL (ref 0.44–1.00)
GFR, Estimated: >60 mL/min (ref >60)
Glucose, Bld: 112 mg/dL — ABNORMAL HIGH (ref 70–99)
Potassium: 3.8 mmol/L (ref 3.5–5.1)
Sodium: 134 mmol/L — ABNORMAL LOW (ref 135–145)

## 2021-09-28 LAB — GLUCOSE, CAPILLARY
Glucose-Capillary: 104 mg/dL — ABNORMAL HIGH (ref 70–99)
Glucose-Capillary: 105 mg/dL — ABNORMAL HIGH (ref 70–99)
Glucose-Capillary: 102 mg/dL — ABNORMAL HIGH (ref 70–99)
Glucose-Capillary: 102 mg/dL — ABNORMAL HIGH (ref 70–99)
Glucose-Capillary: 135 mg/dL — ABNORMAL HIGH (ref 70–99)

## 2021-09-28 LAB — MAGNESIUM: Magnesium: 1.9 mg/dL (ref 1.7–2.4)

## 2021-09-28 LAB — URIC ACID: Uric Acid, Serum: 4 mg/dL (ref 2.5–7.1)

## 2021-09-28 MED ORDER — GUAIFENESIN ER 600 MG PO TB12
600.0000 mg | ORAL_TABLET | Freq: Two times a day (BID) | ORAL | Status: DC
  Administered 2021-09-28 – 2021-10-04 (×13): 600 mg via ORAL
  Filled 2021-09-28 (×14): qty 1

## 2021-09-28 MED ORDER — SODIUM CHLORIDE 0.9% FLUSH
3.0000 mL | INTRAVENOUS | Status: DC | PRN
  Administered 2021-09-29: 3 mL via INTRAVENOUS

## 2021-09-28 MED ORDER — DULOXETINE HCL 20 MG PO CPEP
20.0000 mg | ORAL_CAPSULE | Freq: Every day | ORAL | Status: DC
  Administered 2021-09-28 – 2021-10-04 (×7): 20 mg via ORAL
  Filled 2021-09-28 (×7): qty 1

## 2021-09-28 NOTE — Consult Note (Signed)
Hospital Consult    Reason for Consult:  celiac artery occlusion Referring Physician:  Dr. Tyrell Antonio MRN #:  [id number]  History of Present Illness: This is a 87 y.o. female initially presenting to the emergency department on 10/5 with 3 to 4 days of generalized abdominal pain.  She was subsequently found to be septic with concern for an intra-abdominal source.  She underwent CT scan which demonstrated occlusion of her celiac artery.  In talking the patient she is now eating does not have any abdominal pain now.  She denies any previous pain with eating or food fear.  She is recently admitted to the hospital and did have some weight loss but has not had significant weight loss recently.  She denies any previous history of vascular disease.  She states that she is a lifelong non-smoker does have hypertension and hyperlipidemia and her father had vascular disease.  She has had a hip replaced in the past no previous vascular interventions.  She denies any coronary artery disease.  She does take a baby aspirin at home she is not on blood thinners or a statin.  Past Medical History:  Diagnosis Date   Anxiety    Arthritis    osteoarthritis. spinal stenosis. Scoliosis of spine-degenerative spine.   Bilateral cataracts    CAD (coronary artery disease)    minimal, improved on right 06/2016   Chronic kidney disease    STAGE 4   DDD (degenerative disc disease), lumbar    Dyspnea    Elevated cholesterol    GERD (gastroesophageal reflux disease)    controls with Nexium   Grade I diastolic dysfunction 99/24/2683   Noted on ECHO   History of cardiomegaly    History of gallstones    Hypertension    LVH (left ventricular hypertrophy) 05/04/2017   Mil, noted on ECHO   Mild depression    Pre-diabetes    RBBB (right bundle branch block)    Spinal stenosis    Tubular adenoma    and benign polyps   Vitamin D deficiency    Wears partial dentures     Past Surgical History:  Procedure Laterality  Date   CATARACT EXTRACTION Bilateral 03/06/2013   CHOLECYSTECTOMY     COLONOSCOPY     CRYOTHERAPY     DILATION AND CURETTAGE OF UTERUS     esi     HEMORRHOID SURGERY N/A 09/21/2018   Procedure: SINGLE COLUMN HEMORRHOIDECTOMY, HEMORRHOIDPEXY;  Surgeon: Leighton Ruff, MD;  Location: [facility];  Service: General;  Laterality: N/A;   TOTAL HIP ARTHROPLASTY Left 03/08/2013   Procedure: LEFT TOTAL HIP ARTHROPLASTY ANTERIOR APPROACH;  Surgeon: Mcarthur Rossetti, MD;  Location: [facility];  Service: Orthopedics;  Laterality: Left;    Allergies  Allergen Reactions   Ace Inhibitors Cough   Hydrochlorothiazide     Low Sodium    Prior to Admission medications   Medication Sig Start Date End Date Taking? Authorizing Provider  acetaminophen (TYLENOL) 650 MG CR tablet Take 650 mg by mouth every 8 (eight) hours as needed for pain.   Yes [provider]  ALPRAZolam (XANAX) 0.25 MG tablet Take 0.25 mg by mouth 3 (three) times daily as needed for anxiety.   Yes [provider]  amLODipine (NORVASC) 10 MG tablet Take 10 mg by mouth daily.   Yes [provider]  aspirin EC 81 MG tablet Take 81 mg by mouth daily. Swallow whole.   Yes [provider]  carvedilol (COREG) 12.5 MG  tablet Take 12.5 mg by mouth 2 (two) times daily with a meal.   Yes [provider]  Cholecalciferol (VITAMIN D) 2000 units CAPS Take 2,000 Units by mouth daily.   Yes [provider]  DULoxetine (CYMBALTA) 20 MG capsule Take 1 capsule (20 mg total) by mouth daily. 06/03/21  Yes Jessy Oto, MD  esomeprazole (NEXIUM) 40 MG capsule Take 40 mg by mouth daily.   Yes [provider]  gabapentin (NEURONTIN) 100 MG capsule Take 1 capsule (100 mg total) by mouth at bedtime. 06/03/21  Yes Jessy Oto, MD  loratadine (CLARITIN) 10 MG tablet Take 10 mg by mouth daily.   Yes [provider]  traMADol-acetaminophen (ULTRACET) 37.5-325 MG tablet Take 1  tablet by mouth every 6 (six) hours as needed for moderate pain.   Yes [provider]    Social History   Socioeconomic History   Marital status: Widowed    Spouse name: Not on file   Number of children: 2   Years of education: Not on file   Highest education level: Not on file  Occupational History   Not on file  Tobacco Use   Smoking status: Never   Smokeless tobacco: Never  Vaping Use   Vaping Use: Never used  Substance and Sexual Activity   Alcohol use: Yes    Comment: OCC   Drug use: Never   Sexual activity: Not Currently    Birth control/protection: Post-menopausal  Other Topics Concern   Not on file  Social History Narrative   Not on file   Social Determinants of Health   Financial Resource Strain: Not on file  Food Insecurity: Not on file  Transportation Needs: Not on file  Physical Activity: Not on file  Stress: Not on file  Social Connections: Not on file  Intimate Partner Violence: Not on file     Family History  Problem Relation Age of Onset   Heart disease Father    Heart failure Father    Diabetes Father    Atrial fibrillation Sister     Review of Systems  Constitutional:  Positive for malaise/fatigue and weight loss.  HENT: Negative.    Eyes: Negative.   Respiratory: Negative.    Cardiovascular: Negative.   Gastrointestinal:  Positive for abdominal pain.  Musculoskeletal:  Positive for back pain.  Skin: Negative.      Physical Examination  Vitals:   09/28/21 0800 09/28/21 1125  BP: (!) 152/42   Pulse: 79   Resp: (!) 29   Temp:  97.6 F (36.4 C)  SpO2: 96%    Body mass index is 40.31 kg/m.  Physical Exam Constitutional:      Appearance: She is obese.  HENT:     Head: Normocephalic.     Nose: Nose normal.  Eyes:     Pupils: Pupils are equal, round, and reactive to light.  Cardiovascular:     Rate and Rhythm: Normal rate.     Pulses:          Radial pulses are 2+ on the right side and 2+ on the left side.        Femoral pulses are 2+ on the right side and 2+ on the left side.      Dorsalis pedis pulses are 2+ on the right side and 2+ on the left side.  Abdominal:     General: Abdomen is flat.     Palpations: Abdomen is soft. There is no mass.  Musculoskeletal:  Cervical back: Normal range of motion and neck supple.     Right lower leg: No edema.     Left lower leg: No edema.  Skin:    General: Skin is warm and dry.     Capillary Refill: Capillary refill takes less than 2 seconds.  Neurological:     Mental Status: She is alert.     CBC    Component Value Date/Time   WBC 20.7 (H) 09/28/2021 0831   RBC 3.82 (L) 09/28/2021 0831   HGB 10.9 (L) 09/28/2021 0831   HCT 33.3 (L) 09/28/2021 0831   PLT 343 09/28/2021 0831   MCV 87.2 09/28/2021 0831   MCH 28.5 09/28/2021 0831   MCHC 32.7 09/28/2021 0831   RDW 14.9 09/28/2021 0831   LYMPHSABS 0.6 (L) 09/22/2021 0811   MONOABS 2.7 (H) 09/22/2021 0811   EOSABS 0.0 09/22/2021 0811   BASOSABS 0.1 09/22/2021 0811    BMET    Component Value Date/Time   NA 134 (L) 09/28/2021 0831   K 3.8 09/28/2021 0831   CL 97 (L) 09/28/2021 0831   CO2 29 09/28/2021 0831   GLUCOSE 112 (H) 09/28/2021 0831   BUN 11 09/28/2021 0831   CREATININE 0.75 09/28/2021 0831   CALCIUM 8.8 (L) 09/28/2021 0831   GFRNONAA >60 09/28/2021 0831   GFRAA 70 (L) 03/11/2013 0510    COAGS: Lab Results  Component Value Date   INR 1.3 (H) 09/22/2021   INR 1.1 06/19/2021   INR 0.96 03/06/2013     Non-Invasive Vascular Imaging:   Abdomen and pelvic CTA impression:   1. No acute findings within the abdomen or pelvis. 2. Moderate to large amount of atherosclerotic plaque within a normal caliber abdominal aorta, not resulting in a hemodynamically significant stenosis. Aortic Atherosclerosis (ICD10-I70.0). 3. 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 The IMA is diseased at its origin though  remains patent, though also with collateral supply from the SMA. Presently, there is no evidence of acute mesenteric ischemia, specifically, no discrete areas of bowel wall thickening or evidence of pneumatosis or portal venous gas, however constellation of above findings could be seen in the setting of chronic mesenteric ischemia. Clinical correlation is advised. 4. Suspected hemodynamically significant narrowing involving the origin of the left renal artery without associated delayed renal enhancement or asymmetric renal atrophy. 5. Colonic diverticulosis without evidence superimposed acute diverticulitis. 6. Post cholecystectomy.     ASSESSMENT/PLAN: This is a 87 y.o. female here with abdominal pain found to be septic with likely source from intra-abdominal lesion.  She has occluded celiac artery with large calcium burden likely very chronic and appears to be well compensated with SMA collaterals.  Her IMA may be diseased at the ostium but is also patent.  The occlusion is likely not of clinical significance.  She should continue baby aspirin and would likely benefit from statin therapy as well.  Vascular surgery will be available as needed.  Arden Tinoco C. Donzetta Matters, MD [facility] Office: [phone] Pager: [phone]

## 2021-09-28 NOTE — Progress Notes (Signed)
PROGRESS NOTE    Deborah Jordan  JFH:[id number] DOB: [birth year] DOA: 09/22/2021 PCP: [name], MD   Brief Narrative: 87 year old with past medical history significant for hyponatremia, hypertension, bilateral chronic knee pain, who presents to [name] ED on 10/5 with abdominal pain for 3 to 4 days.  EMS was called and patient was found to be hypotensive blood pressure in the 80s.  Patient received 2 L of IV fluids and she was a started on Levophed.  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 Patient was admitted under CCM for septic shock from E. coli and strep bacteremia.    Assessment & Plan:   Active Problems:   Septic shock (HCC)   AKI (acute kidney injury) ([place])   Hypokalemia   Transaminitis   Bacteremia  1-Septic shock secondary to E. coli and strep bacteremia Patient presented with hypotension, leukocytosis hypothermia. Blood cultures 10/5; E. coli and Streptococcus Infantarius.  Repeated blood cultures 10/7: No growth to date. Bacteremia thought to be secondary to bowel translocation. ID consulted recommended total 14 days of antibiotics.  Last day of antibiotics 10/21.  Continue with IV cefazolin, could transition to cefadroxil when tolerating oral WBC cell trending down. Echo negative for vegetation.  2-Acute hypoxic respiratory failure secondary to critical illness, pulmonary edema: Patient require mechanical ventilation on 10/5 She received IV diuresis. Patient was extubated 10/9 Patient currently on 4 L of oxygen. She continues to have cough, flutter valve ordered, will add scheduled guaifenesin. Continue with Brovana  Subtotal occlusion of celiac artery: I have consulted vascular. She denies abdominal pain, had mild abdominal pain yesterday.  AKI: Resolved Hypokalemia,  hypomagnesemia: Replace  Transaminases: Presume shock liver  History of CAD, hypertension, hyperlipidemia, diastolic heart failure, right bundle branch block: Continue with aspirin.  If blood pressure remains stable could resume carvedilol tomorrow.  Monitor volume status and consider resuming Lasix  Anxiety: Continue with Xanax.  Will resume Cymbalta.  Continue with gabapentin   Nutrition Problem: Inadequate oral intake Etiology: inability to eat    Signs/Symptoms: NPO status    Interventions: Tube feeding  Estimated body mass index is 40.31 kg/m as calculated from the following:   Height as of this encounter: 4\' 9"  (1.448 m).   Weight as of this encounter: 84.5 kg.   DVT prophylaxis: Heparin Code Status: Full Family Communication: Discussed with daughter who was at bedside Disposition Plan:  Status is: Inpatient  Remains inpatient appropriate because:IV treatments appropriate due to intensity of illness or inability to take PO  Dispo: The patient is from: Home              Anticipated d/c is to: SNF versus home health              Patient currently is not medically stable to d/c.   Difficult to place patient No        Consultants:  CM admitted patient. Vascular surgery ID  Procedures:  ECHO;   Antimicrobials:    Subjective: She is alert, denies worsening dyspnea.  Cough has improved.  She denies abdominal pain currently.  Daughter reported that she complained of some mild abdominal pain yesterday.  She had a bowel movement after so she does not know if it was just related to bowel movement.  Objective: Vitals:   09/28/21 0615 09/28/21 9373 09/28/21 0743 09/28/21  0750  BP: (!) 155/56     Pulse: 90 88 83   Resp: 19 (!) 23 16   Temp:    97.7 F (36.5 C)  TempSrc:    Axillary  SpO2: (!) 87% 91% 95%   Weight:      Height:        Intake/Output Summary (Last 24 hours) at 09/28/2021 0753 Last data filed at 09/28/2021 0600 Gross per 24 hour   Intake 1102.76 ml  Output 700 ml  Net 402.76 ml   Filed Weights   09/25/21 0500 09/26/21 0500 09/28/21 0500  Weight: 84.3 kg 82.6 kg 84.5 kg    Examination:  General exam: Appears calm and comfortable  Respiratory system: Bilateral air movement, bilateral crackles Cardiovascular system: S1 & S2 heard, RRR.  Gastrointestinal system: Abdomen is nondistended, soft and nontender. No organomegaly or masses felt. Normal bowel sounds heard. Central nervous system: Alert and oriented.  Extremities: Symmetric 5 x 5 power.   Data Reviewed: I have personally reviewed following labs and imaging studies  CBC: Recent Labs  Lab 09/22/21 0811 09/22/21 0831 09/22/21 1359 09/23/21 0343 09/24/21 0428 09/26/21 0220 09/26/21 1043  WBC 33.8*  --   --  46.3* 38.6* 31.2* 26.3*  NEUTROABS 29.7*  --   --   --   --   --   --   HGB 9.6*   < > 10.9* 10.3* 9.8* 10.0* 9.8*  HCT 29.6*   < > 32.0* 30.0* 28.9* 29.7* 28.7*  MCV 90.0  --   --  85.5 84.8 83.7 84.7  PLT 243  --   --  223 208 220 226   < > = values in this interval not displayed.   Basic Metabolic Panel: Recent Labs  Lab 09/23/21 0343 09/23/21 0905 09/23/21 1130 09/23/21 2005 09/24/21 0428 09/24/21 1313 09/25/21 0508 09/25/21 1824 09/26/21 0220 09/26/21 1043  NA 131*  --   --   --    < > 133* 136 134* 137 137  K 4.3  --   --   --    < > 3.7 3.3* 3.6 3.7 4.0  CL 101  --   --   --    < > 101 102 101 101 101  CO2 19*  --   --   --    < > 25 25 23 25 25   GLUCOSE 116*  --   --   --    < > 119* 144* 154* 113* 165*  BUN 20  --   --   --    < > 32* 30* 27* 28* 31*  CREATININE 1.11*  --   --   --    < > 1.01* 0.88 0.80 0.98 1.01*  CALCIUM 8.4*  --   --   --    < > 8.5* 8.3* 8.1* 8.2* 8.3*  MG 1.2* 2.5* 2.7* 2.4  --  2.0  --   --  1.4*  --   PHOS 4.4  --   --   --   --   --   --   --   --   --    < > = values in this interval not displayed.   GFR: Estimated Creatinine Clearance: 36 mL/min (A) (by C-G formula based on SCr of 1.01  mg/dL (H)). Liver Function Tests: Recent Labs  Lab 09/22/21 0811 09/23/21 0343 09/26/21 0220  AST 468* 240* 33  ALT 499* 370* 114*  ALKPHOS 186* 153* 191*  BILITOT 2.3* 1.4* 0.8  PROT 5.1* 5.1* 5.1*  ALBUMIN 2.8* 2.4* 2.2*   Recent Labs  Lab 09/22/21 0811  LIPASE 135*   No results for input(s): AMMONIA in the last 168 hours. Coagulation Profile: Recent Labs  Lab 09/22/21 0811  INR 1.3*   Cardiac Enzymes: No results for input(s): CKTOTAL, CKMB, CKMBINDEX, TROPONINI in the last 168 hours. BNP (last 3 results) No results for input(s): PROBNP in the last 8760 hours. HbA1C: No results for input(s): HGBA1C in the last 72 hours. CBG: Recent Labs  Lab 09/27/21 1130 09/27/21 1539 09/27/21 2047 09/27/21 2346 09/28/21 0348  GLUCAP 99 109* 125* 101* 104*   Lipid Profile: No results for input(s): CHOL, HDL, LDLCALC, TRIG, CHOLHDL, LDLDIRECT in the last 72 hours. Thyroid Function Tests: No results for input(s): TSH, T4TOTAL, FREET4, T3FREE, THYROIDAB in the last 72 hours. Anemia Panel: No results for input(s): VITAMINB12, FOLATE, FERRITIN, TIBC, IRON, RETICCTPCT in the last 72 hours. Sepsis Labs: Recent Labs  Lab 09/22/21 1624 09/22/21 1955 09/23/21 0905 09/23/21 1126  LATICACIDVEN 4.1* 3.9* 2.3* 2.1*    Recent Results (from the past 240 hour(s))  Culture, blood (routine x 2)     Status: Abnormal   Collection Time: 09/22/21  8:10 AM   Specimen: BLOOD  Result Value Ref Range Status   Specimen Description BLOOD RIGHT ANTECUBITAL  Final   Special Requests   Final    BOTTLES DRAWN AEROBIC AND ANAEROBIC Blood Culture results may not be optimal due to an inadequate volume of blood received in culture bottles   Culture  Setup Time   Final    GRAM POSITIVE COCCI IN BOTH AEROBIC AND ANAEROBIC BOTTLES CRITICAL VALUE NOTED.  VALUE IS CONSISTENT WITH PREVIOUSLY REPORTED AND CALLED VALUE.    Culture (A)  Final    ESCHERICHIA COLI STREPTOCOCCUS  INFANTARIUS SUSCEPTIBILITIES PERFORMED ON PREVIOUS CULTURE WITHIN THE LAST 5 DAYS. Performed at [facility], [place] [address], [place], [place] [address]    Report Status 09/25/2021 FINAL  Final  Resp Panel by RT-PCR (Flu A&B, Covid) Nasopharyngeal Swab     Status: None   Collection Time: 09/22/21  8:13 AM   Specimen: Nasopharyngeal Swab; Nasopharyngeal(NP) swabs in vial transport medium  Result Value Ref Range Status   SARS Coronavirus 2 by RT PCR NEGATIVE NEGATIVE Final    Comment: (NOTE) SARS-CoV-2 target nucleic acids are NOT DETECTED.  The SARS-CoV-2 RNA is generally detectable in upper respiratory specimens during the acute phase of infection. The lowest concentration of SARS-CoV-2 viral copies this assay can detect is 138 copies/mL. A negative result does not preclude SARS-Cov-2 infection and should not be used as the sole basis for treatment or other patient management decisions. A negative result may occur with  improper specimen collection/handling, submission of specimen other than nasopharyngeal swab, presence of viral mutation(s) within the areas targeted by this assay, and inadequate number of viral copies(<138 copies/mL). A negative result must be combined with clinical observations, patient history, and epidemiological information. The expected result is Negative.  Fact Sheet for Patients:  EntrepreneurPulse.com.au  Fact Sheet for Healthcare Providers:  IncredibleEmployment.be  This test is no t yet approved or cleared by the [place] FDA and  has been authorized for detection and/or diagnosis of SARS-CoV-2 by FDA under an Emergency Use Authorization (EUA). This EUA will remain  in effect (meaning this test can be used) for the duration of the COVID-19 declaration under Section 564(b)(1) of the Act, 21 U.S.C.section 360bbb-3(b)(1), unless  the authorization is terminated  or revoked sooner.       Influenza A by PCR  NEGATIVE NEGATIVE Final   Influenza B by PCR NEGATIVE NEGATIVE Final    Comment: (NOTE) The Xpert Xpress SARS-CoV-2/FLU/RSV plus assay is intended as an aid in the diagnosis of influenza from Nasopharyngeal swab specimens and should not be used as a sole basis for treatment. Nasal washings and aspirates are unacceptable for Xpert Xpress SARS-CoV-2/FLU/RSV testing.  Fact Sheet for Patients: EntrepreneurPulse.com.au  Fact Sheet for Healthcare Providers: IncredibleEmployment.be  This test is not yet approved or cleared by the [place] FDA and has been authorized for detection and/or diagnosis of SARS-CoV-2 by FDA under an Emergency Use Authorization (EUA). This EUA will remain in effect (meaning this test can be used) for the duration of the COVID-19 declaration under Section 564(b)(1) of the Act, 21 U.S.C. section 360bbb-3(b)(1), unless the authorization is terminated or revoked.  Performed at [facility], [place] [address], [place], [place] [address]   Culture, blood (routine x 2)     Status: Abnormal   Collection Time: 09/22/21  8:15 AM   Specimen: BLOOD LEFT FOREARM  Result Value Ref Range Status   Specimen Description BLOOD LEFT FOREARM  Final   Special Requests   Final    BOTTLES DRAWN AEROBIC AND ANAEROBIC Blood Culture results may not be optimal due to an inadequate volume of blood received in culture bottles   Culture  Setup Time   Final    GRAM POSITIVE COCCI GRAM NEGATIVE RODS IN BOTH AEROBIC AND ANAEROBIC BOTTLES CRITICAL RESULT CALLED TO, READ BACK BY AND VERIFIED WITH: PHARMD GREG ABBOTT 09/23/2021@00 :07 BY TW Performed at [facility], [place] [address], [place], [place] [address]    Culture ESCHERICHIA COLI STREPTOCOCCUS INFANTARIUS  (A)  Final   Report Status 09/26/2021 FINAL  Final   Organism ID, Bacteria ESCHERICHIA COLI  Final   Organism ID, Bacteria STREPTOCOCCUS INFANTARIUS  Final      Susceptibility    Escherichia coli - MIC*    AMPICILLIN >=32 RESISTANT Resistant     CEFAZOLIN <=4 SENSITIVE Sensitive     CEFEPIME <=0.12 SENSITIVE Sensitive     CEFTAZIDIME <=1 SENSITIVE Sensitive     CEFTRIAXONE <=0.25 SENSITIVE Sensitive     CIPROFLOXACIN 0.5 SENSITIVE Sensitive     GENTAMICIN >=16 RESISTANT Resistant     IMIPENEM <=0.25 SENSITIVE Sensitive     TRIMETH/SULFA <=20 SENSITIVE Sensitive     AMPICILLIN/SULBACTAM 16 INTERMEDIATE Intermediate     PIP/TAZO <=4 SENSITIVE Sensitive     * ESCHERICHIA COLI   Streptococcus infantarius - MIC*    PENICILLIN <=0.06 SENSITIVE Sensitive     CEFTRIAXONE <=0.12 SENSITIVE Sensitive     ERYTHROMYCIN <=0.12 SENSITIVE Sensitive     LEVOFLOXACIN 2 SENSITIVE Sensitive     VANCOMYCIN 0.5 SENSITIVE Sensitive     * STREPTOCOCCUS INFANTARIUS  Blood Culture ID Panel (Reflexed)     Status: Abnormal   Collection Time: 09/22/21  8:15 AM  Result Value Ref Range Status   Enterococcus faecalis NOT DETECTED NOT DETECTED Final   Enterococcus Faecium NOT DETECTED NOT DETECTED Final   Listeria monocytogenes NOT DETECTED NOT DETECTED Final   Staphylococcus species NOT DETECTED NOT DETECTED Final   Staphylococcus aureus (BCID) NOT DETECTED NOT DETECTED Final   Staphylococcus epidermidis NOT DETECTED NOT DETECTED Final   Staphylococcus lugdunensis NOT DETECTED NOT DETECTED Final   Streptococcus species DETECTED (A) NOT DETECTED Final  Comment: Not Enterococcus species, Streptococcus agalactiae, Streptococcus pyogenes, or Streptococcus pneumoniae. CRITICAL RESULT CALLED TO, READ BACK BY AND VERIFIED WITH: PHARMD GREG ABBOTT 09/23/2021@00 :07 BY TW    Streptococcus agalactiae NOT DETECTED NOT DETECTED Final   Streptococcus pneumoniae NOT DETECTED NOT DETECTED Final   Streptococcus pyogenes NOT DETECTED NOT DETECTED Final   A.calcoaceticus-baumannii NOT DETECTED NOT DETECTED Final   Bacteroides fragilis NOT DETECTED NOT DETECTED Final   Enterobacterales DETECTED (A) NOT  DETECTED Final    Comment: Enterobacterales represent a large order of gram negative bacteria, not a single organism. CRITICAL RESULT CALLED TO, READ BACK BY AND VERIFIED WITH: PHARMD GREG ABBOTT 09/23/2021@00 :07 BY TW    Enterobacter cloacae complex NOT DETECTED NOT DETECTED Final   Escherichia coli DETECTED (A) NOT DETECTED Final    Comment: CRITICAL RESULT CALLED TO, READ BACK BY AND VERIFIED WITH: PHARMD GREG ABBOTT 09/23/2021@00 :07 BY TW    Klebsiella aerogenes NOT DETECTED NOT DETECTED Final   Klebsiella oxytoca NOT DETECTED NOT DETECTED Final   Klebsiella pneumoniae NOT DETECTED NOT DETECTED Final   Proteus species NOT DETECTED NOT DETECTED Final   Salmonella species NOT DETECTED NOT DETECTED Final   Serratia marcescens NOT DETECTED NOT DETECTED Final   Haemophilus influenzae NOT DETECTED NOT DETECTED Final   Neisseria meningitidis NOT DETECTED NOT DETECTED Final   Pseudomonas aeruginosa NOT DETECTED NOT DETECTED Final   Stenotrophomonas maltophilia NOT DETECTED NOT DETECTED Final   Candida albicans NOT DETECTED NOT DETECTED Final   Candida auris NOT DETECTED NOT DETECTED Final   Candida glabrata NOT DETECTED NOT DETECTED Final   Candida krusei NOT DETECTED NOT DETECTED Final   Candida parapsilosis NOT DETECTED NOT DETECTED Final   Candida tropicalis NOT DETECTED NOT DETECTED Final   Cryptococcus neoformans/gattii NOT DETECTED NOT DETECTED Final   CTX-M ESBL NOT DETECTED NOT DETECTED Final   Carbapenem resistance IMP NOT DETECTED NOT DETECTED Final   Carbapenem resistance KPC NOT DETECTED NOT DETECTED Final   Carbapenem resistance NDM NOT DETECTED NOT DETECTED Final   Carbapenem resist OXA 48 LIKE NOT DETECTED NOT DETECTED Final   Carbapenem resistance VIM NOT DETECTED NOT DETECTED Final    Comment: Performed at [facility], [address] [place], [place] [address]  MRSA Next Gen by PCR, Nasal     Status: None   Collection Time: 09/22/21 11:46 AM   Specimen: Nasal  Mucosa; Nasal Swab  Result Value Ref Range Status   MRSA by PCR Next Gen NOT DETECTED NOT DETECTED Final    Comment: (NOTE) The GeneXpert MRSA Assay (FDA approved for NASAL specimens only), is one component of a comprehensive MRSA colonization surveillance program. It is not intended to diagnose MRSA infection nor to guide or monitor treatment for MRSA infections. Test performance is not FDA approved in patients less than 32 years old. Performed at [facility], [place] [address], [place], [place] [address]   Culture, blood (routine x 2)     Status: None (Preliminary result)   Collection Time: 09/24/21 10:00 AM   Specimen: BLOOD RIGHT HAND  Result Value Ref Range Status   Specimen Description BLOOD RIGHT HAND  Final   Special Requests   Final    BOTTLES DRAWN AEROBIC ONLY Blood Culture adequate volume   Culture   Final    NO GROWTH 3 DAYS Performed at [facility], [place] [address] [place], [place] [address]    Report Status PENDING  Incomplete  Culture, blood (routine x  2)     Status: None (Preliminary result)   Collection Time: 09/24/21 10:08 AM   Specimen: BLOOD RIGHT HAND  Result Value Ref Range Status   Specimen Description BLOOD RIGHT HAND  Final   Special Requests   Final    BOTTLES DRAWN AEROBIC ONLY Blood Culture adequate volume   Culture   Final    NO GROWTH 3 DAYS Performed at [facility], [address] [place], [place] [address]    Report Status PENDING  Incomplete         Radiology Studies: No results found.      Scheduled Meds:  ALPRAZolam  0.25 mg Oral TID   arformoterol  15 mcg Nebulization BID   aspirin  81 mg Oral Daily   chlorhexidine  15 mL Mouth Rinse BID   Chlorhexidine Gluconate Cloth  6 each Topical Q0600   feeding supplement  237 mL Oral BID BM   gabapentin  100 mg Oral QHS   heparin  5,000 Units Subcutaneous Q8H   mouth rinse  15 mL Mouth Rinse q12n4p   pantoprazole  40 mg Oral Daily   revefenacin  175 mcg  Nebulization Daily   Continuous Infusions:  sodium chloride Stopped (09/26/21 1408)    ceFAZolin (ANCEF) IV 200 mL/hr at 09/28/21 0600     LOS: 6 days    Time spent: 35 minutes.     Elmarie Shiley, MD [facility]   If 7PM-7AM, please contact night-coverage [email or web address]  09/28/2021, 7:53 AM

## 2021-09-28 NOTE — Progress Notes (Signed)
SLP Cancellation Note  Patient Details Name: SHALIE SCHREMP MRN: [id number] DOB: 1933/06/09   Cancelled treatment:       Reason Eval/Treat Not Completed: Fatigue/lethargy limiting ability to participate. Checked in with RN. Pt is sleeping soundly but generally improving. Coughing, unrelated to PO intake, continues. Will f/u when pt able to participate.    Manroop Jakubowicz, Katherene Ponto 09/28/2021, 10:37 AM

## 2021-09-29 ENCOUNTER — Inpatient Hospital Stay (HOSPITAL_COMMUNITY): Payer: Medicare Other

## 2021-09-29 DIAGNOSIS — N179 Acute kidney failure, unspecified: Secondary | ICD-10-CM | POA: Diagnosis not present

## 2021-09-29 LAB — BASIC METABOLIC PANEL
Anion gap: 10 (ref 5–15)
BUN: 12 mg/dL (ref 8–23)
CO2: 27 mmol/L (ref 22–32)
Calcium: 8.9 mg/dL (ref 8.9–10.3)
Chloride: 97 mmol/L — ABNORMAL LOW (ref 98–111)
Creatinine, Ser: 0.67 mg/dL (ref 0.44–1.00)
GFR, Estimated: >60 mL/min (ref >60)
Glucose, Bld: 113 mg/dL — ABNORMAL HIGH (ref 70–99)
Potassium: 3.9 mmol/L (ref 3.5–5.1)
Sodium: 134 mmol/L — ABNORMAL LOW (ref 135–145)

## 2021-09-29 LAB — CBC
HCT: 31 % — ABNORMAL LOW (ref 36.0–46.0)
Hemoglobin: 10.1 g/dL — ABNORMAL LOW (ref 12.0–15.0)
MCH: 28.5 pg (ref 26.0–34.0)
MCHC: 32.6 g/dL (ref 30.0–36.0)
MCV: 87.3 fL (ref 80.0–100.0)
Platelets: 368 10*3/uL (ref 150–400)
RBC: 3.55 MIL/uL — ABNORMAL LOW (ref 3.87–5.11)
RDW: 14.6 % (ref 11.5–15.5)
WBC: 20.8 10*3/uL — ABNORMAL HIGH (ref 4.0–10.5)
nRBC: 0 % (ref 0.0–0.2)

## 2021-09-29 LAB — CULTURE, BLOOD (ROUTINE X 2)
Culture: NO GROWTH
Culture: NO GROWTH
Special Requests: ADEQUATE
Special Requests: ADEQUATE

## 2021-09-29 LAB — GLUCOSE, CAPILLARY
Glucose-Capillary: 117 mg/dL — ABNORMAL HIGH (ref 70–99)
Glucose-Capillary: 121 mg/dL — ABNORMAL HIGH (ref 70–99)
Glucose-Capillary: 124 mg/dL — ABNORMAL HIGH (ref 70–99)

## 2021-09-29 MED ORDER — COLCHICINE 0.6 MG PO TABS
0.6000 mg | ORAL_TABLET | Freq: Every day | ORAL | Status: DC
  Administered 2021-09-29 – 2021-10-04 (×6): 0.6 mg via ORAL
  Filled 2021-09-29 (×6): qty 1

## 2021-09-29 MED ORDER — ATORVASTATIN CALCIUM 10 MG PO TABS
20.0000 mg | ORAL_TABLET | Freq: Every day | ORAL | Status: DC
  Administered 2021-09-29 – 2021-10-04 (×6): 20 mg via ORAL
  Filled 2021-09-29 (×6): qty 2

## 2021-09-29 MED ORDER — CEFADROXIL 500 MG PO CAPS
500.0000 mg | ORAL_CAPSULE | Freq: Two times a day (BID) | ORAL | Status: DC
  Administered 2021-09-29 – 2021-10-04 (×11): 500 mg via ORAL
  Filled 2021-09-29 (×12): qty 1

## 2021-09-29 MED ORDER — TAMSULOSIN HCL 0.4 MG PO CAPS
0.4000 mg | ORAL_CAPSULE | Freq: Every day | ORAL | Status: DC
  Administered 2021-09-29 – 2021-10-03 (×5): 0.4 mg via ORAL
  Filled 2021-09-29 (×6): qty 1

## 2021-09-29 MED ORDER — PANTOPRAZOLE SODIUM 40 MG PO TBEC: 40.0000 mg | DELAYED_RELEASE_TABLET | Freq: Every day | ORAL | Status: DC

## 2021-09-29 MED ORDER — ENOXAPARIN SODIUM 40 MG/0.4ML IJ SOSY
40.0000 mg | PREFILLED_SYRINGE | INTRAMUSCULAR | Status: DC
  Administered 2021-09-29 – 2021-10-03 (×5): 40 mg via SUBCUTANEOUS
  Filled 2021-09-29 (×5): qty 0.4

## 2021-09-29 MED ORDER — ALBUTEROL SULFATE (2.5 MG/3ML) 0.083% IN NEBU
2.5000 mg | INHALATION_SOLUTION | Freq: Four times a day (QID) | RESPIRATORY_TRACT | Status: DC | PRN
  Administered 2021-09-29 – 2021-10-02 (×4): 2.5 mg via RESPIRATORY_TRACT
  Filled 2021-09-29 (×4): qty 3

## 2021-09-29 MED ORDER — CARVEDILOL 12.5 MG PO TABS
12.5000 mg | ORAL_TABLET | Freq: Two times a day (BID) | ORAL | Status: DC
  Administered 2021-09-29 – 2021-10-04 (×10): 12.5 mg via ORAL
  Filled 2021-09-29 (×11): qty 1

## 2021-09-29 MED ORDER — ASPIRIN EC 81 MG PO TBEC: 81.0000 mg | DELAYED_RELEASE_TABLET | Freq: Every day | ORAL | Status: DC

## 2021-09-29 MED ORDER — DICLOFENAC SODIUM 1 % EX GEL
2.0000 g | Freq: Three times a day (TID) | CUTANEOUS | Status: DC
  Administered 2021-09-29 – 2021-10-03 (×18): 2 g via TOPICAL
  Filled 2021-09-29: qty 100

## 2021-09-29 MED ORDER — ALPRAZOLAM 0.25 MG PO TABS: 0.2500 mg | ORAL_TABLET | Freq: Three times a day (TID) | ORAL | Status: DC | PRN

## 2021-09-29 MED ORDER — GABAPENTIN 100 MG PO CAPS: 100.0000 mg | ORAL_CAPSULE | Freq: Every day | ORAL | Status: DC

## 2021-09-29 MED ORDER — COLCHICINE 0.6 MG PO TABS
0.6000 mg | ORAL_TABLET | Freq: Once | ORAL | Status: AC
  Administered 2021-09-29: 0.6 mg via ORAL
  Filled 2021-09-29: qty 1

## 2021-09-29 NOTE — Progress Notes (Signed)
Pt bladder scan resulted in 426 ml.  MD order for foley to be placed.

## 2021-09-29 NOTE — Progress Notes (Signed)
Nutrition Follow-up  DOCUMENTATION CODES:   Obesity unspecified  INTERVENTION:   -Continue Ensure Enlive po BID, each supplement provides 350 kcal and 20 grams of protein  -Continue Magic cup TID with meals, each supplement provides 290 kcal and 9 grams of protein  -Continue MVI with minerals daily  NUTRITION DIAGNOSIS:   Inadequate oral intake related to inability to eat as evidenced by NPO status.  Progressing; advanced to PO diet on 09/27/21  GOAL:   Patient will meet greater than or equal to 90% of their needs  Progressing   MONITOR:   PO intake, Supplement acceptance, Labs, Weight trends, Skin, I & O's  REASON FOR ASSESSMENT:   Rounds    ASSESSMENT:   87 yo female admitted with shock in the setting of bacteremia d/t E. Coli and Strep infantarius, hypotension. PMH includes HTN, GERD, cardiomegaly, DDD, spinal stenosis, LVH, CKD, HLD, vitamin D deficiency, CAD.  10/9- extubated 10/10- a/p BSE- advanced to regular diet with thin liquids  Reviewed I/o's: -1.9 L x 24 hours and +1.7 L since admission  UOP: 2.3 L x 24 hours  Spoke with pt, daughter, and granddaughter. Pt shares that she is afraid to eat, due to coughing and discomfort in throat (feeling of food getting stuck). She is tolerating soft solid foods and liquids well. Per pt, she consumed all of her grits this morning and has been drinking her Ensure supplements. Noted meal completions documented at 0-40%.   Per pt daughter, pt has had sensation of food getting stuck in her throat for about a year and was evaluated by PCP and GI. A swallow evaluation was suggested an an outpatient, however, this was not done. Pt also with hiatal hernia per daughter, which she suspects may be contributing to swallow discomfort. RD reached out to SLP regarding findings.   Discussed importance of good meal and supplement intake to promote healing.   Medications reviewed.   Labs reviewed: Na: 134, CBGS: 121-135.  Diet Order:    Diet Order             Diet regular Room service appropriate? Yes; Fluid consistency: Thin  Diet effective now                   EDUCATION NEEDS:   Education needs have been addressed  Skin:  Skin Assessment: Skin Integrity Issues: Skin Integrity Issues:: Other (Comment) Other: MASD to rt thigh  Last BM:  09/29/21  Height:   Ht Readings from Last 1 Encounters:  09/28/21 4\' 9"  (1.448 m)    Weight:   Wt Readings from Last 1 Encounters:  09/29/21 88.1 kg   BMI:  Body mass index is 42.03 kg/m.  Estimated Nutritional Needs:   Kcal:  1450-1650  Protein:  85-95 gm  Fluid:  >/= 1.5 L    Loistine Chance, RD, LDN, [place] Registered Dietitian II Certified Diabetes Care and Education Specialist Please refer to [facility] for RD and/or RD on-call/weekend/after hours pager

## 2021-09-29 NOTE — Progress Notes (Signed)
Physical Therapy Treatment Patient Details Name: Deborah Jordan MRN: [id number] DOB: December 14, 1933 Today's Date: 09/29/2021   History of Present Illness pt is an 87 y/o female admitted 10/5 with intermittent abdominal pain, nausea and vomiting for 4 days.  Pt noted to be severely hypotensive requiring aggressive IV fluid resuscitation.  PMHx: HTN, bil chronic knee pain, deg arthritis of hip, bil sciaticaLVH, CAD, CKD4.    PT Comments    Patient assisted on/off BSC with moderate assist, followed by transfer training and exercises. Daughter present throughout and agrees pt needs further time with therapies prior to discharge home. Both are in agreement with SNF for therapies. Discharge plan updated.     Recommendations for follow up therapy are one component of a multi-disciplinary discharge planning process, led by the attending physician.  Recommendations may be updated based on patient status, additional functional criteria and insurance authorization.  Follow Up Recommendations  SNF;Supervision/Assistance - 24 hour     Equipment Recommendations  None recommended by PT;Other (comment) (TBA further)    Recommendations for Other Services       Precautions / Restrictions Precautions Precautions: Fall     Mobility  Bed Mobility                    Transfers Overall transfer level: Needs assistance Equipment used: Rolling walker (2 wheeled);1 person hand held assist Transfers: Sit to/from [facility] Sit to Stand: Min assist Stand pivot transfers: Mod assist       General transfer comment: cues for hand placement (both with pivoting with HHA and reaching for armrest and with use of RW); anterior wt-shift assisted  Ambulation/Gait             General Gait Details: unable due to rt foot pain   Stairs             Wheelchair Mobility    Modified Rankin (Stroke Patients Only)       Balance Overall balance assessment: Needs  assistance Sitting-balance support: Bilateral upper extremity supported;No upper extremity supported;Feet supported Sitting balance-Leahy Scale: Fair     Standing balance support: Bilateral upper extremity supported;During functional activity Standing balance-Leahy Scale: Poor Standing balance comment: needs external support.                            Cognition Arousal/Alertness: Awake/alert Behavior During Therapy: WFL for tasks assessed/performed Overall Cognitive Status: Within Functional Limits for tasks assessed (pt states mentation not normal, slowed processing, but functional for evaluation.)                                        Exercises General Exercises - Lower Extremity Ankle Circles/Pumps: AROM;Both;10 reps Long Arc Quad: AROM;Both;10 reps Hip Flexion/Marching: AROM;Both;10 reps Other Exercises Other Exercises: sit to stand x 5 reps in a row with pt requiring incr time for rest between attempts. assist with anterior wt-shift and balance    General Comments General comments (skin integrity, edema, etc.): daughter present; reports pt has been very sleepy ever since she came off the "breathing machine"      Pertinent Vitals/Pain Faces Pain Scale: No hurt    Home Living Family/patient expects to be discharged to:: Private residence Living Arrangements: Children Available Help at Discharge: Family;Available 24 hours/day;Available PRN/intermittently Type of Home: House Home Access: Stairs to enter Entrance Stairs-Rails: Right;Left Home  Layout: Two level;Able to live on main level with bedroom/bathroom Home Equipment: Walker - 4 wheels;Bedside commode;Tub bench      Prior Function Level of Independence: Independent with assistive device(s)          PT Goals (current goals can now be found in the care plan section) Acute Rehab PT Goals Patient Stated Goal: get back home, PLOF Time For Goal Achievement: 10/11/21 Potential to Achieve  Goals: Good Progress towards PT goals: Progressing toward goals    Frequency    Min 3X/week      PT Plan Discharge plan needs to be updated    Co-evaluation              AM-PAC PT "6 Clicks" Mobility   Outcome Measure  Help needed turning from your back to your side while in a flat bed without using bedrails?: A Lot Help needed moving from lying on your back to sitting on the side of a flat bed without using bedrails?: A Lot Help needed moving to and from a bed to a chair (including a wheelchair)?: A Lot Help needed standing up from a chair using your arms (e.g., wheelchair or bedside chair)?: A Little Help needed to walk in hospital room?: Total Help needed climbing 3-5 steps with a railing? : Total 6 Click Score: 11    End of Session   Activity Tolerance: Patient limited by fatigue Patient left: with call bell/phone within reach;with nursing/sitter in room;in chair;with chair alarm set;with family/visitor present Nurse Communication: Mobility status PT Visit Diagnosis: Other abnormalities of gait and mobility (R26.89);Muscle weakness (generalized) (M62.81);Difficulty in walking, not elsewhere classified (R26.2)     Time: 4010-2725 PT Time Calculation (min) (ACUTE ONLY): 33 min  Charges:  $Gait Training: 8-22 mins $Therapeutic Exercise: 8-22 mins                      Arby Barrette, PT Pager [phone]    Rexanne Mano 09/29/2021, 11:36 AM

## 2021-09-29 NOTE — Progress Notes (Signed)
Speech Language Pathology Treatment: Dysphagia  Patient Details Name: Deborah Jordan MRN: [id number] DOB: 1933-03-17 Today's Date: 09/29/2021 Time: 1224-4975 SLP Time Calculation (min) (ACUTE ONLY): 13 min  Assessment / Plan / Recommendation Clinical Impression  Pt was seen for dysphagia treatment and she was cooperative throughout the session. Pt, her son, and Marni Griffon, RN reported that the pt has been tolerating the current diet without overt s/sx of aspiration. Pt demonstrated occasional coughing at baseline. However, she tolerated regular texture solids, and thin liquids via straw using individual and consecutive swallows without symptoms of oropharyngeal dysphagia. Pt and her son were educated regarding swallowing-breathing reciprocity and the need for rest breaks if she becomes dyspneic. Both parties verbalized understanding and agreement. It is recommended that the current diet of regular texture solids and thin liquids be continued. Further skilled SLP services are not clinically indicated at this time.    HPI HPI: Patient is an 87 y.o. female with PMH:  Degenerative arthritis of hip; Pleural effusion; Hyponatremia; Hypertension; Bilateral chronic knee pain; Chronic bilateral low back pain with bilateral sciatica; Dyspnea and respiratory abnormalities; and Septic shock. She presented to [facility] ED on 10/5 after 3-4 days of abdominal pain which initially was intermittent but became constant and worsened in severity with radiation into back. EMS called and she was found to be hypotensive in the 80's. In ED she received 2L fluids without improvement and started on Levophed. CXR revealed pulmonary edema and small bilateral pleural effusions. She was intubated on day of admission and extubated on 10/9 to supplemental oxygen via nasal cannula.      SLP Plan  All goals met;Discharge SLP treatment due to (comment)      Recommendations for follow up therapy are one component of a multi-disciplinary  discharge planning process, led by the attending physician.  Recommendations may be updated based on patient status, additional functional criteria and insurance authorization.    Recommendations  Diet recommendations: Regular;Thin liquid Liquids provided via: Cup;Straw Medication Administration: Whole meds with liquid Supervision: Patient able to self feed Compensations: Slow rate;Small sips/bites Postural Changes and/or Swallow Maneuvers: Seated upright 90 degrees                Oral Care Recommendations: Oral care BID Follow up Recommendations: None SLP Visit Diagnosis: Dysphagia, unspecified (R13.10) Plan: All goals met;Discharge SLP treatment due to (comment)       Fidelia Cathers I. Hardin Negus, [place], [place] Office number [phone] Pager [place]  09/29/2021, 5:17 PM

## 2021-09-29 NOTE — NC FL2 (Signed)
[place] LEVEL OF CARE SCREENING TOOL     IDENTIFICATION  Patient Name: Deborah Jordan Birthdate: 08/26/1933 Sex: female Admission Date (Current Location): 09/22/2021  [facility] [place] Number:  Herbalist and Address:  The . [facility], [place] [address] [place], [place] [address]      Provider Number: [id number]  Attending Physician Name and Address:  Darliss Cheney, MD  Relative Name and Phone Number:       Current Level of Care: Hospital Recommended Level of Care: [place] Prior Approval Number:    Date Approved/Denied:   PASRR Number: [id number]  Discharge Plan: SNF    Current Diagnoses: Patient Active Problem List   Diagnosis Date Noted   Bacteremia    Septic shock ([place]) 09/22/2021   AKI (acute kidney injury) ([place])    Hypokalemia    Transaminitis    Dyspnea and respiratory abnormalities    Chronic bilateral low back pain with bilateral sciatica 09/27/2017   Bilateral chronic knee pain 03/13/2017   Pleural effusion 03/10/2013   Hyponatremia 03/10/2013   Hypertension 03/10/2013   Degenerative arthritis of hip 03/08/2013    Orientation RESPIRATION BLADDER Height & Weight     Self, Time, Situation, Place  O2 (2L nasal cannula) Incontinent, Indwelling catheter Weight: 194 lb 3.6 oz (88.1 kg) Height:  4\' 9"  (144.8 cm)  BEHAVIORAL SYMPTOMS/MOOD NEUROLOGICAL BOWEL NUTRITION STATUS      Incontinent Diet (see dc summary)  AMBULATORY STATUS COMMUNICATION OF NEEDS Skin   Extensive Assist Verbally Normal (MASD on thigh)                       Personal Care Assistance Level of Assistance  Bathing, Feeding, Dressing Bathing Assistance: Maximum assistance Feeding assistance: Independent Dressing Assistance: Limited assistance     Functional Limitations Info  Sight Sight Info: Impaired        SPECIAL CARE FACTORS FREQUENCY  PT (By licensed PT), OT (By licensed OT)     PT Frequency: 5x/week OT  Frequency: 5x/week            Contractures Contractures Info: Not present    Additional Factors Info  Psychotropic, Code Status, Allergies Code Status Info: Full Allergies Info: Ace Inhibitors, Hydrochlorothiazide Psychotropic Info: Xanax; Cymbalta         Current Medications (09/29/2021):  This is the current hospital active medication list Current Facility-Administered Medications  Medication Dose Route Frequency Provider Last Rate Last Admin   0.9 %  sodium chloride infusion   Intravenous PRN Freddi Starr, MD   Stopped at 09/26/21 1408   acetaminophen (TYLENOL) tablet 650 mg  650 mg Oral Q6H PRN Mauri Brooklyn, MD   650 mg at 09/29/21 1122   ALPRAZolam (XANAX) tablet 0.25 mg  0.25 mg Oral TID Margaretha Seeds, MD   0.25 mg at 09/29/21 0819   arformoterol (BROVANA) nebulizer solution 15 mcg  15 mcg Nebulization BID Freddi Starr, MD   15 mcg at 09/29/21 0749   aspirin chewable tablet 81 mg  81 mg Oral Daily Margaretha Seeds, MD   81 mg at 09/29/21 5621   calcium carbonate (TUMS - dosed in mg elemental calcium) chewable tablet 200 mg of elemental calcium  1 tablet Oral Q6H PRN Mauri Brooklyn, MD   200 mg of elemental calcium at 09/26/21 2039   cefadroxil (DURICEF) capsule 500 mg  500 mg Oral BID Darliss Cheney, MD   500 mg  at 09/29/21 1122   chlorhexidine (PERIDEX) 0.12 % solution 15 mL  15 mL Mouth Rinse BID Margaretha Seeds, MD   15 mL at 09/29/21 6811   Chlorhexidine Gluconate Cloth 2 % PADS 6 each  6 each Topical Q0600 Margaretha Seeds, MD   6 each at 09/28/21 0535   diclofenac Sodium (VOLTAREN) 1 % topical gel 2 g  2 g Topical TID AC & HS Chotiner, Yevonne Aline, MD   2 g at 09/29/21 1123   docusate sodium (COLACE) capsule 100 mg  100 mg Oral BID PRN Margaretha Seeds, MD       DULoxetine (CYMBALTA) DR capsule 20 mg  20 mg Oral Daily Regalado, Belkys A, MD   20 mg at 09/29/21 0818   enoxaparin (LOVENOX) injection 40 mg  40 mg Subcutaneous Q24H Pahwani, Einar Grad, MD        feeding supplement (ENSURE ENLIVE / ENSURE PLUS) liquid 237 mL  237 mL Oral BID BM Audria Nine, DO   237 mL at 09/29/21 1000   gabapentin (NEURONTIN) capsule 100 mg  100 mg Oral QHS Margaretha Seeds, MD   100 mg at 09/28/21 2153   guaiFENesin (MUCINEX) 12 hr tablet 600 mg  600 mg Oral BID Regalado, Belkys A, MD   600 mg at 09/29/21 0819   guaiFENesin (ROBITUSSIN) 100 MG/5ML solution 100 mg  5 mL Oral Q4H PRN Audria Nine, DO   100 mg at 09/27/21 2207   MEDLINE mouth rinse  15 mL Mouth Rinse q12n4p Margaretha Seeds, MD   15 mL at 09/29/21 1123   ondansetron (ZOFRAN) injection 4 mg  4 mg Intravenous Q8H PRN Mauri Brooklyn, MD   4 mg at 09/26/21 2039   pantoprazole (PROTONIX) EC tablet 40 mg  40 mg Oral Daily Margaretha Seeds, MD   40 mg at 09/29/21 0819   polyethylene glycol (MIRALAX / GLYCOLAX) packet 17 g  17 g Oral Daily PRN Margaretha Seeds, MD       revefenacin [facility]) nebulizer solution 175 mcg  175 mcg Nebulization Daily Freda Jackson B, MD   175 mcg at 09/29/21 0749   sodium chloride flush (NS) 0.9 % injection 3 mL  3 mL Intravenous PRN Audria Nine, DO   3 mL at 09/29/21 5726     Discharge Medications: Please see discharge summary for a list of discharge medications.  Relevant Imaging Results:  Relevant Lab Results:   Additional Information SSN: [id number]. Moderna COVID-19 Vaccine 03/19/2020  Benard Halsted, LCSW

## 2021-09-29 NOTE — Progress Notes (Addendum)
Notified by RN that pt complains of pain in right first toe and right foot at base of toe. States her mother has gout and concerned she does. No known injury. No redness.  Foot examined and no warmth to touch or erythema. Tender to palpation of base of right toe. Has minimal movement of 1st MP joint.  Uric acid done last night was 4.  No signs of gout.  Obtain xray right foot to further evaluate  Addendum: Xray of foot shows no fracture or dislocation. Does have OA.  Trial of Voltaren gel on right foot.  Pt also has not had urine output and has 440 on bladder scan. Had I&O earlier so will place foley

## 2021-09-29 NOTE — Progress Notes (Signed)
RT called to bedside by RN stating patient now wanted to try BiPAP.   Patient continues to state it makes her claustrophobic per previous note. Patient SOB RT gave patient prn treatment and placed back on [place].  Patient satting 97% on [place].

## 2021-09-29 NOTE — Progress Notes (Signed)
Pt arrived to unit; transfer from ICU.  Patient oriented to room and call bell use. Patient arrived to unit w/ flexiseal that was used in past; disposed of in biohazard bin by RN.  Patient PIV x 3 checked for patency and all functioning.  Patient placed on 2 L [place]; later pt. Bipap brought to her new room but did not tolerate and stopped using, switching back to the Nasal cannula.  Call bell within reach.  No questions from patient; will continue to follow.

## 2021-09-29 NOTE — Progress Notes (Signed)
Patient states she can not tolerate BiPAP machine due to claustrophobia.  Patient on [place] VS stable.  RT will continue to monitor.

## 2021-09-29 NOTE — Progress Notes (Signed)
PROGRESS NOTE    Deborah SILVEY  Jordan:[id number] DOB: Feb 07, 1933 DOA: 09/22/2021 PCP: [name], MD   Brief Narrative: 87 year old with past medical history significant for hyponatremia, hypertension, bilateral chronic knee pain, who presents to [name] ED on 10/5 with abdominal pain for 3 to 4 days.  EMS was called and patient was found to be hypotensive blood pressure in the 80s.  Patient received 2 L of IV fluids and she was a started on Levophed.  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 Patient was admitted under CCM for septic shock from E. coli and strep bacteremia.    Assessment & Plan:   Active Problems:   Septic shock (HCC)   AKI (acute kidney injury) ([place])   Hypokalemia   Transaminitis   Bacteremia  1-Septic shock secondary to E. coli and strep infantarius bacteremia: Patient presented with hypotension, leukocytosis, hypothermia.  Required pressors which were weaned off on 09/23/2021. Blood cultures 10/5; E. coli and Streptococcus Infantarius.  Repeated blood cultures 10/7: No growth to date. Bacteremia thought to be secondary to bowel translocation. ID consulted recommended total 14 days of antibiotics with end date of 10/08/2021.  Patient on IV cefazolin, now that she is taking oral, will transition to cefadroxil per ID recommendations.  She is afebrile with leukocytosis improving. Echo negative for vegetation.  2-Acute hypoxic respiratory failure secondary to critical illness, pulmonary edema: Patient required mechanical ventilation on 10/5 and extubated on 09/26/2021. She received IV diuresis. Patient currently on 3 L oxygen.  We will continue to wean. She continues to have cough, flutter valve ordered, continue scheduled guaifenesin. Continue with Brovana  Subtotal occlusion of celiac  artery: Per vascular surgery, she has occluded celiac artery with large calcium burden likely very chronic and appears to be well compensated with SMA collaterals.  Occlusion is not of clinical significance.  They recommended continue aspirin and adding statin but no a statin ordered.  We will order atorvastatin 20 mg.  AKI: Resolved  Hypokalemia, hypomagnesemia: Resolved  Transaminases: Presume shock liver, will repeat labs tomorrow morning.  History of CAD, hypertension, hyperlipidemia, diastolic heart failure, right bundle branch block: Resume aspirin  Acute urinary retention: Foley catheter was placed last night.  Will start on Flomax.  Right great toe gout: Patient started complaining of pain at the right great toe base.  On examination, toe is swollen, red, tender and warm to touch.  She has all the signs of gout.  X-ray was negative.  I will start her on colchicine.  Anxiety: Continue Xanax, Cymbalta and gabapentin  Deconditioning: PT OT recommended SNF.  TOC on board.  Nutrition Problem: Inadequate oral intake Etiology: inability to eat    Signs/Symptoms: NPO status    Interventions: Tube feeding  Estimated body mass index is 42.03 kg/m as calculated from the following:   Height as of this encounter: 4\' 9"  (1.448 m).   Weight as of this encounter: 88.1 kg.   DVT prophylaxis: Heparin Code Status: Full Family Communication: Discussed with daughter and granddaughter who was at bedside Disposition Plan:  Status is: Inpatient  Remains inpatient appropriate because:IV treatments appropriate due to intensity of illness or inability to take PO  Dispo: The patient is from: Home              Anticipated d/c is to: SNF versus home health  Patient currently is not medically stable to d/c.   Difficult to place patient No  Consultants:  CM admitted patient. Vascular surgery ID  Procedures:  ECHO;   Antimicrobials:    Subjective: Patient seen and  examined.  Daughter and granddaughter at the bedside.  The only complaint she had was pain in the base of the right great toe.  Otherwise no shortness of breath.  Objective: Vitals:   09/29/21 0436 09/29/21 0750 09/29/21 0754 09/29/21 0813  BP:    (!) 157/65  Pulse:  (!) 102  96  Resp:  20  18  Temp:    98.4 F (36.9 C)  TempSrc:    Oral  SpO2:  96% 96% 94%  Weight: 88.1 kg     Height:        Intake/Output Summary (Last 24 hours) at 09/29/2021 1230 Last data filed at 09/29/2021 0708 Gross per 24 hour  Intake 439.87 ml  Output 3000 ml  Net -2560.13 ml    Filed Weights   09/28/21 0500 09/28/21 2243 09/29/21 0436  Weight: 84.5 kg 88.3 kg 88.1 kg    Examination:  General exam: Appears calm and comfortable  Respiratory system: Clear to auscultation. Respiratory effort normal. Cardiovascular system: S1 & S2 heard, RRR. No JVD, murmurs, rubs, gallops or clicks. No pedal edema. Gastrointestinal system: Abdomen is nondistended, soft and nontender. No organomegaly or masses felt. Normal bowel sounds heard. Central nervous system: Alert and oriented. No focal neurological deficits. Extremities: Symmetric 5 x 5 power.  Right great toe with edema, erythema, warm to touch and tender to palpation. Skin: No rashes, lesions or ulcers.  Psychiatry: Judgement and insight appear normal. Mood & affect appropriate.    Data Reviewed: I have personally reviewed following labs and imaging studies  CBC: Recent Labs  Lab 09/24/21 0428 09/26/21 0220 09/26/21 1043 09/28/21 0831 09/29/21 0121  WBC 38.6* 31.2* 26.3* 20.7* 20.8*  HGB 9.8* 10.0* 9.8* 10.9* 10.1*  HCT 28.9* 29.7* 28.7* 33.3* 31.0*  MCV 84.8 83.7 84.7 87.2 87.3  PLT 208 220 226 343 683    Basic Metabolic Panel: Recent Labs  Lab 09/23/21 0343 09/23/21 0905 09/23/21 1130 09/23/21 2005 09/24/21 0428 09/24/21 1313 09/25/21 0508 09/25/21 1824 09/26/21 0220 09/26/21 1043 09/28/21 0831 09/29/21 0121  NA 131*  --   --    --    < > 133*   < > 134* 137 137 134* 134*  K 4.3  --   --   --    < > 3.7   < > 3.6 3.7 4.0 3.8 3.9  CL 101  --   --   --    < > 101   < > 101 101 101 97* 97*  CO2 19*  --   --   --    < > 25   < > 23 25 25 29 27   GLUCOSE 116*  --   --   --    < > 119*   < > 154* 113* 165* 112* 113*  BUN 20  --   --   --    < > 32*   < > 27* 28* 31* 11 12  CREATININE 1.11*  --   --   --    < > 1.01*   < > 0.80 0.98 1.01* 0.75 0.67  CALCIUM 8.4*  --   --   --    < > 8.5*   < > 8.1* 8.2* 8.3* 8.8* 8.9  MG 1.2*   < > 2.7* 2.4  --  2.0  --   --  1.4*  --  1.9  --   PHOS 4.4  --   --   --   --   --   --   --   --   --   --   --    < > = values in this interval not displayed.    GFR: Estimated Creatinine Clearance: 46.5 mL/min (by C-G formula based on SCr of 0.67 mg/dL). Liver Function Tests: Recent Labs  Lab 09/23/21 0343 09/26/21 0220  AST 240* 33  ALT 370* 114*  ALKPHOS 153* 191*  BILITOT 1.4* 0.8  PROT 5.1* 5.1*  ALBUMIN 2.4* 2.2*    No results for input(s): LIPASE, AMYLASE in the last 168 hours.  No results for input(s): AMMONIA in the last 168 hours. Coagulation Profile: No results for input(s): INR, PROTIME in the last 168 hours.  Cardiac Enzymes: No results for input(s): CKTOTAL, CKMB, CKMBINDEX, TROPONINI in the last 168 hours. BNP (last 3 results) No results for input(s): PROBNP in the last 8760 hours. HbA1C: No results for input(s): HGBA1C in the last 72 hours. CBG: Recent Labs  Lab 09/28/21 1123 09/28/21 1507 09/28/21 1925 09/29/21 0046 09/29/21 0435  GLUCAP 102* 102* 135* 121* 124*    Lipid Profile: No results for input(s): CHOL, HDL, LDLCALC, TRIG, CHOLHDL, LDLDIRECT in the last 72 hours. Thyroid Function Tests: No results for input(s): TSH, T4TOTAL, FREET4, T3FREE, THYROIDAB in the last 72 hours. Anemia Panel: No results for input(s): VITAMINB12, FOLATE, FERRITIN, TIBC, IRON, RETICCTPCT in the last 72 hours. Sepsis Labs: Recent Labs  Lab 09/22/21 1624  09/22/21 1955 09/23/21 0905 09/23/21 1126  LATICACIDVEN 4.1* 3.9* 2.3* 2.1*     Recent Results (from the past 240 hour(s))  Culture, blood (routine x 2)     Status: Abnormal   Collection Time: 09/22/21  8:10 AM   Specimen: BLOOD  Result Value Ref Range Status   Specimen Description BLOOD RIGHT ANTECUBITAL  Final   Special Requests   Final    BOTTLES DRAWN AEROBIC AND ANAEROBIC Blood Culture results may not be optimal due to an inadequate volume of blood received in culture bottles   Culture  Setup Time   Final    GRAM POSITIVE COCCI IN BOTH AEROBIC AND ANAEROBIC BOTTLES CRITICAL VALUE NOTED.  VALUE IS CONSISTENT WITH PREVIOUSLY REPORTED AND CALLED VALUE.    Culture (A)  Final    ESCHERICHIA COLI STREPTOCOCCUS INFANTARIUS SUSCEPTIBILITIES PERFORMED ON PREVIOUS CULTURE WITHIN THE LAST 5 DAYS. Performed at [facility], [place] [address] [place], [place] [address]    Report Status 09/25/2021 FINAL  Final  Resp Panel by RT-PCR (Flu A&B, Covid) Nasopharyngeal Swab     Status: None   Collection Time: 09/22/21  8:13 AM   Specimen: Nasopharyngeal Swab; Nasopharyngeal(NP) swabs in vial transport medium  Result Value Ref Range Status   SARS Coronavirus 2 by RT PCR NEGATIVE NEGATIVE Final    Comment: (NOTE) SARS-CoV-2 target nucleic acids are NOT DETECTED.  The SARS-CoV-2 RNA is generally detectable in upper respiratory specimens during the acute phase of infection. The lowest concentration of SARS-CoV-2 viral copies this assay can detect is 138 copies/mL. A negative result does not preclude SARS-Cov-2 infection and should not be used as the sole basis for treatment or other patient management decisions. A negative result may occur with  improper specimen collection/handling, submission of specimen other than nasopharyngeal  swab, presence of viral mutation(s) within the areas targeted by this assay, and inadequate number of viral copies(<138 copies/mL). A negative result must be  combined with clinical observations, patient history, and epidemiological information. The expected result is Negative.  Fact Sheet for Patients:  EntrepreneurPulse.com.au  Fact Sheet for Healthcare Providers:  IncredibleEmployment.be  This test is no t yet approved or cleared by the [place] FDA and  has been authorized for detection and/or diagnosis of SARS-CoV-2 by FDA under an Emergency Use Authorization (EUA). This EUA will remain  in effect (meaning this test can be used) for the duration of the COVID-19 declaration under Section 564(b)(1) of the Act, 21 U.S.C.section 360bbb-3(b)(1), unless the authorization is terminated  or revoked sooner.       Influenza A by PCR NEGATIVE NEGATIVE Final   Influenza B by PCR NEGATIVE NEGATIVE Final    Comment: (NOTE) The Xpert Xpress SARS-CoV-2/FLU/RSV plus assay is intended as an aid in the diagnosis of influenza from Nasopharyngeal swab specimens and should not be used as a sole basis for treatment. Nasal washings and aspirates are unacceptable for Xpert Xpress SARS-CoV-2/FLU/RSV testing.  Fact Sheet for Patients: EntrepreneurPulse.com.au  Fact Sheet for Healthcare Providers: IncredibleEmployment.be  This test is not yet approved or cleared by the [place] FDA and has been authorized for detection and/or diagnosis of SARS-CoV-2 by FDA under an Emergency Use Authorization (EUA). This EUA will remain in effect (meaning this test can be used) for the duration of the COVID-19 declaration under Section 564(b)(1) of the Act, 21 U.S.C. section 360bbb-3(b)(1), unless the authorization is terminated or revoked.  Performed at [facility], [place] [address] [place], [place] [address]   Culture, blood (routine x 2)     Status: Abnormal   Collection Time: 09/22/21  8:15 AM   Specimen: BLOOD LEFT FOREARM  Result Value Ref Range Status   Specimen  Description BLOOD LEFT FOREARM  Final   Special Requests   Final    BOTTLES DRAWN AEROBIC AND ANAEROBIC Blood Culture results may not be optimal due to an inadequate volume of blood received in culture bottles   Culture  Setup Time   Final    GRAM POSITIVE COCCI GRAM NEGATIVE RODS IN BOTH AEROBIC AND ANAEROBIC BOTTLES CRITICAL RESULT CALLED TO, READ BACK BY AND VERIFIED WITH: PHARMD GREG ABBOTT 09/23/2021@00 :07 BY TW Performed at [facility], [place] [address] [place], [place] [address]    Culture ESCHERICHIA COLI STREPTOCOCCUS INFANTARIUS  (A)  Final   Report Status 09/26/2021 FINAL  Final   Organism ID, Bacteria ESCHERICHIA COLI  Final   Organism ID, Bacteria STREPTOCOCCUS INFANTARIUS  Final      Susceptibility   Escherichia coli - MIC*    AMPICILLIN >=32 RESISTANT Resistant     CEFAZOLIN <=4 SENSITIVE Sensitive     CEFEPIME <=0.12 SENSITIVE Sensitive     CEFTAZIDIME <=1 SENSITIVE Sensitive     CEFTRIAXONE <=0.25 SENSITIVE Sensitive     CIPROFLOXACIN 0.5 SENSITIVE Sensitive     GENTAMICIN >=16 RESISTANT Resistant     IMIPENEM <=0.25 SENSITIVE Sensitive     TRIMETH/SULFA <=20 SENSITIVE Sensitive     AMPICILLIN/SULBACTAM 16 INTERMEDIATE Intermediate     PIP/TAZO <=4 SENSITIVE Sensitive     * ESCHERICHIA COLI   Streptococcus infantarius - MIC*    PENICILLIN <=0.06 SENSITIVE Sensitive     CEFTRIAXONE <=0.12 SENSITIVE Sensitive     ERYTHROMYCIN <=0.12 SENSITIVE Sensitive     LEVOFLOXACIN 2 SENSITIVE Sensitive  VANCOMYCIN 0.5 SENSITIVE Sensitive     * STREPTOCOCCUS INFANTARIUS  Blood Culture ID Panel (Reflexed)     Status: Abnormal   Collection Time: 09/22/21  8:15 AM  Result Value Ref Range Status   Enterococcus faecalis NOT DETECTED NOT DETECTED Final   Enterococcus Faecium NOT DETECTED NOT DETECTED Final   Listeria monocytogenes NOT DETECTED NOT DETECTED Final   Staphylococcus species NOT DETECTED NOT DETECTED Final   Staphylococcus aureus (BCID) NOT DETECTED NOT  DETECTED Final   Staphylococcus epidermidis NOT DETECTED NOT DETECTED Final   Staphylococcus lugdunensis NOT DETECTED NOT DETECTED Final   Streptococcus species DETECTED (A) NOT DETECTED Final    Comment: Not Enterococcus species, Streptococcus agalactiae, Streptococcus pyogenes, or Streptococcus pneumoniae. CRITICAL RESULT CALLED TO, READ BACK BY AND VERIFIED WITH: PHARMD GREG ABBOTT 09/23/2021@00 :07 BY TW    Streptococcus agalactiae NOT DETECTED NOT DETECTED Final   Streptococcus pneumoniae NOT DETECTED NOT DETECTED Final   Streptococcus pyogenes NOT DETECTED NOT DETECTED Final   A.calcoaceticus-baumannii NOT DETECTED NOT DETECTED Final   Bacteroides fragilis NOT DETECTED NOT DETECTED Final   Enterobacterales DETECTED (A) NOT DETECTED Final    Comment: Enterobacterales represent a large order of gram negative bacteria, not a single organism. CRITICAL RESULT CALLED TO, READ BACK BY AND VERIFIED WITH: PHARMD GREG ABBOTT 09/23/2021@00 :07 BY TW    Enterobacter cloacae complex NOT DETECTED NOT DETECTED Final   Escherichia coli DETECTED (A) NOT DETECTED Final    Comment: CRITICAL RESULT CALLED TO, READ BACK BY AND VERIFIED WITH: PHARMD GREG ABBOTT 09/23/2021@00 :07 BY TW    Klebsiella aerogenes NOT DETECTED NOT DETECTED Final   Klebsiella oxytoca NOT DETECTED NOT DETECTED Final   Klebsiella pneumoniae NOT DETECTED NOT DETECTED Final   Proteus species NOT DETECTED NOT DETECTED Final   Salmonella species NOT DETECTED NOT DETECTED Final   Serratia marcescens NOT DETECTED NOT DETECTED Final   Haemophilus influenzae NOT DETECTED NOT DETECTED Final   Neisseria meningitidis NOT DETECTED NOT DETECTED Final   Pseudomonas aeruginosa NOT DETECTED NOT DETECTED Final   Stenotrophomonas maltophilia NOT DETECTED NOT DETECTED Final   Candida albicans NOT DETECTED NOT DETECTED Final   Candida auris NOT DETECTED NOT DETECTED Final   Candida glabrata NOT DETECTED NOT DETECTED Final   Candida krusei NOT  DETECTED NOT DETECTED Final   Candida parapsilosis NOT DETECTED NOT DETECTED Final   Candida tropicalis NOT DETECTED NOT DETECTED Final   Cryptococcus neoformans/gattii NOT DETECTED NOT DETECTED Final   CTX-M ESBL NOT DETECTED NOT DETECTED Final   Carbapenem resistance IMP NOT DETECTED NOT DETECTED Final   Carbapenem resistance KPC NOT DETECTED NOT DETECTED Final   Carbapenem resistance NDM NOT DETECTED NOT DETECTED Final   Carbapenem resist OXA 48 LIKE NOT DETECTED NOT DETECTED Final   Carbapenem resistance VIM NOT DETECTED NOT DETECTED Final    Comment: Performed at [facility], [address], [place], [place] [address]  MRSA Next Gen by PCR, Nasal     Status: None   Collection Time: 09/22/21 11:46 AM   Specimen: Nasal Mucosa; Nasal Swab  Result Value Ref Range Status   MRSA by PCR Next Gen NOT DETECTED NOT DETECTED Final    Comment: (NOTE) The GeneXpert MRSA Assay (FDA approved for NASAL specimens only), is one component of a comprehensive MRSA colonization surveillance program. It is not intended to diagnose MRSA infection nor to guide or monitor treatment for MRSA infections. Test performance is not FDA approved in patients less than 33 years old.  Performed at [facility], [place] [address] [place], [place] [address]   Culture, blood (routine x 2)     Status: None   Collection Time: 09/24/21 10:00 AM   Specimen: BLOOD RIGHT HAND  Result Value Ref Range Status   Specimen Description BLOOD RIGHT HAND  Final   Special Requests   Final    BOTTLES DRAWN AEROBIC ONLY Blood Culture adequate volume   Culture   Final    NO GROWTH 5 DAYS Performed at [facility], [place] [address] [place], [place] [address]    Report Status 09/29/2021 FINAL  Final  Culture, blood (routine x 2)     Status: None   Collection Time: 09/24/21 10:08 AM   Specimen: BLOOD RIGHT HAND  Result Value Ref Range Status   Specimen Description BLOOD RIGHT HAND  Final   Special Requests   Final     BOTTLES DRAWN AEROBIC ONLY Blood Culture adequate volume   Culture   Final    NO GROWTH 5 DAYS Performed at [facility], [place] [address], [place], [place] [address]    Report Status 09/29/2021 FINAL  Final          Radiology Studies: DG Foot 2 Views Right  Result Date: 09/29/2021 CLINICAL DATA:  Right great toe pain EXAM: RIGHT FOOT - 2 VIEW COMPARISON:  None. FINDINGS: Normal alignment. No fracture or dislocation. Moderate midfoot degenerative arthritis. Vascular calcifications are seen within the right foot. Mild-to-moderate soft tissue swelling of the right forefoot and dorsum of the foot is present, best appreciated on lateral examination. IMPRESSION: Soft tissue swelling.  No acute fracture or dislocation. Moderate midfoot degenerative arthritis. Peripheral vascular disease. Electronically Signed   By: Fidela Salisbury M.D.   On: 09/29/2021 01:45        Scheduled Meds:  ALPRAZolam  0.25 mg Oral TID   arformoterol  15 mcg Nebulization BID   aspirin  81 mg Oral Daily   cefadroxil  500 mg Oral BID   chlorhexidine  15 mL Mouth Rinse BID   Chlorhexidine Gluconate Cloth  6 each Topical Q0600   colchicine  0.6 mg Oral Daily   And   colchicine  0.6 mg Oral Once   diclofenac Sodium  2 g Topical TID AC & HS   DULoxetine  20 mg Oral Daily   enoxaparin (LOVENOX) injection  40 mg Subcutaneous Q24H   feeding supplement  237 mL Oral BID BM   gabapentin  100 mg Oral QHS   guaiFENesin  600 mg Oral BID   mouth rinse  15 mL Mouth Rinse q12n4p   pantoprazole  40 mg Oral Daily   revefenacin  175 mcg Nebulization Daily   Continuous Infusions:  sodium chloride Stopped (09/26/21 1408)     LOS: 7 days    Time spent: 39 minutes.   Darliss Cheney, MD [facility]   If 7PM-7AM, please contact night-coverage [email or web address]  09/29/2021, 12:30 PM

## 2021-09-29 NOTE — Progress Notes (Signed)
Placed patient on CPAP via FFM, auto titrate max 14, min 6 cm H20 with 2L O2 bleed in. Tolerating well at this time. RN aware.

## 2021-09-29 NOTE — Plan of Care (Signed)
  Problem: Education: Goal: Knowledge of General Education information will improve Description: Including pain rating scale, medication(s)/side effects and non-pharmacologic comfort measures Outcome: Progressing   Problem: Nutrition: Goal: Adequate nutrition will be maintained Outcome: Progressing   Problem: Coping: Goal: Level of anxiety will decrease Outcome: Progressing   Problem: Elimination: Goal: Will not experience complications related to bowel motility Outcome: Progressing   Problem: Activity: Goal: Risk for activity intolerance will decrease Outcome: Not Progressing

## 2021-09-30 DIAGNOSIS — N179 Acute kidney failure, unspecified: Secondary | ICD-10-CM | POA: Diagnosis not present

## 2021-09-30 LAB — CBC WITH DIFFERENTIAL/PLATELET
Abs Immature Granulocytes: 0 10*3/uL (ref 0.00–0.07)
Basophils Absolute: 0 10*3/uL (ref 0.0–0.1)
Basophils Relative: 0 %
Eosinophils Absolute: 0.3 10*3/uL (ref 0.0–0.5)
Eosinophils Relative: 2 %
HCT: 27.3 % — ABNORMAL LOW (ref 36.0–46.0)
Hemoglobin: 9.4 g/dL — ABNORMAL LOW (ref 12.0–15.0)
Lymphocytes Relative: 8 %
Lymphs Abs: 1.4 10*3/uL (ref 0.7–4.0)
MCH: 29.5 pg (ref 26.0–34.0)
MCHC: 34.4 g/dL (ref 30.0–36.0)
MCV: 85.6 fL (ref 80.0–100.0)
Monocytes Absolute: 1 10*3/uL (ref 0.1–1.0)
Monocytes Relative: 6 %
Neutro Abs: 14.4 10*3/uL — ABNORMAL HIGH (ref 1.7–7.7)
Neutrophils Relative %: 84 %
Platelets: 306 10*3/uL (ref 150–400)
RBC: 3.19 MIL/uL — ABNORMAL LOW (ref 3.87–5.11)
RDW: 14.5 % (ref 11.5–15.5)
WBC: 17.1 10*3/uL — ABNORMAL HIGH (ref 4.0–10.5)
nRBC: 0 % (ref 0.0–0.2)
nRBC: 0 /100 WBC

## 2021-09-30 LAB — COMPREHENSIVE METABOLIC PANEL
ALT: 30 U/L (ref 0–44)
AST: 21 U/L (ref 15–41)
Albumin: 2.4 g/dL — ABNORMAL LOW (ref 3.5–5.0)
Alkaline Phosphatase: 114 U/L (ref 38–126)
Anion gap: 9 (ref 5–15)
BUN: 8 mg/dL (ref 8–23)
CO2: 27 mmol/L (ref 22–32)
Calcium: 8.7 mg/dL — ABNORMAL LOW (ref 8.9–10.3)
Chloride: 96 mmol/L — ABNORMAL LOW (ref 98–111)
Creatinine, Ser: 0.69 mg/dL (ref 0.44–1.00)
GFR, Estimated: >60 mL/min (ref >60)
Glucose, Bld: 113 mg/dL — ABNORMAL HIGH (ref 70–99)
Potassium: 4.2 mmol/L (ref 3.5–5.1)
Sodium: 132 mmol/L — ABNORMAL LOW (ref 135–145)
Total Bilirubin: 0.8 mg/dL (ref 0.3–1.2)
Total Protein: 5.6 g/dL — ABNORMAL LOW (ref 6.5–8.1)

## 2021-09-30 LAB — GLUCOSE, CAPILLARY
Glucose-Capillary: 103 mg/dL — ABNORMAL HIGH (ref 70–99)
Glucose-Capillary: 105 mg/dL — ABNORMAL HIGH (ref 70–99)
Glucose-Capillary: 109 mg/dL — ABNORMAL HIGH (ref 70–99)
Glucose-Capillary: 114 mg/dL — ABNORMAL HIGH (ref 70–99)
Glucose-Capillary: 118 mg/dL — ABNORMAL HIGH (ref 70–99)
Glucose-Capillary: 129 mg/dL — ABNORMAL HIGH (ref 70–99)

## 2021-09-30 NOTE — Progress Notes (Signed)
Bipap removed from room. Patient refused and said she tried it but she is too claustrophobic to wear this machine.  She will wear her O2 and be fine.

## 2021-09-30 NOTE — TOC Initial Note (Addendum)
Transition of Care [facility]) - Initial/Assessment Note    Patient Details  Name: Deborah Jordan MRN: [id number] Date of Birth: [birth year]  Transition of [facility] CM/SW Contact:    Canda Podgorski Aileen Fass, [place] Work Phone Number: 09/30/2021, 10:50 AM  Clinical Narrative:                 CSW intern spoke with the patient and their daughter Judieth Keens [phone]) at bedside. CSW Intern provided a list of SNF bed offers that they would like to look over before making a decision. We will follow up later today to discuss facility choice.  10/14: Spoke with patients daughter yesterday afternoon, she has decided on [facility]. Now waiting to hear back from [facility] to see if there is a bed available. Patient does not have home CPAP machine, SNF will need to order one.  Expected Discharge Plan: Skilled Nursing Facility Barriers to Discharge: No Barriers Identified   Patient Goals and CMS Choice   CMS Medicare.gov Compare Post Acute Care list provided to:: Other (Comment Required) Meredith Mody Suzie Portela (Daughter) [phone]) Choice offered to / list presented to : Patient, Adult Children  Expected Discharge Plan and Services Expected Discharge Plan: [place] In-house Referral: Clinical Social Work   Post Acute Care Choice: [place] Living arrangements for the past 2 months: [place]                                      Prior Living Arrangements/Services Living arrangements for the past 2 months: Single Family Home Lives with:: Adult Children Patient language and need for interpreter reviewed:: Yes Do you feel safe going back to the place where you live?: No      Need for Family Participation in Patient Care: Yes (Comment) Care giver support system in place?: Yes (comment)   Criminal Activity/Legal Involvement Pertinent to Current Situation/Hospitalization: No - Comment as needed  Activities of Daily Living   ADL Screening (condition  at time of admission) Is the patient deaf or have difficulty hearing?: No Does the patient have difficulty seeing, even when wearing glasses/contacts?: No Does the patient have difficulty concentrating, remembering, or making decisions?: No Does the patient have difficulty dressing or bathing?: Yes Does the patient have difficulty walking or climbing stairs?: Yes  Permission Sought/Granted Permission sought to share information with : Case Manager, Customer service manager, Family Supports Permission granted to share information with : Yes, Release of Information Signed  Share Information with NAME: Judieth Keens (Daughter) [phone]           Emotional Assessment Appearance:: Developmentally appropriate Attitude/Demeanor/Rapport: Engaged Affect (typically observed): Accepting Orientation: : Oriented to Self, Oriented to Place, Oriented to  Time, Oriented to Situation Alcohol / Substance Use: Never Used Psych Involvement: No (comment)  Admission diagnosis:  Septic shock ([place]) [A41.9, R65.21] Patient Active Problem List   Diagnosis Date Noted   Bacteremia    Septic shock ([place]) 09/22/2021   AKI (acute kidney injury) ([place])    Hypokalemia    Transaminitis    Dyspnea and respiratory abnormalities    Chronic bilateral low back pain with bilateral sciatica 09/27/2017   Bilateral chronic knee pain 03/13/2017   Pleural effusion 03/10/2013   Hyponatremia 03/10/2013   Hypertension 03/10/2013   Degenerative arthritis of hip 03/08/2013   PCP:  [name], MD Pharmacy:   [facility] [place], [place] - [phone] [address] [place] [address] [place] [address] Phone: [phone] Fax: [phone]     Social Determinants of Health (SDOH) Interventions    Readmission Risk Interventions No flowsheet data found.

## 2021-09-30 NOTE — Progress Notes (Signed)
PROGRESS NOTE    Deborah Jordan  NOB:[id number] DOB: May 03, 1933 DOA: 09/22/2021 PCP: [name], MD   Brief Narrative: 87 year old with past medical history significant for hyponatremia, hypertension, bilateral chronic knee pain, who presents to [name] ED on 10/5 with abdominal pain for 3 to 4 days.  EMS was called and patient was found to be hypotensive blood pressure in the 80s.  Patient received 2 L of IV fluids and she was a started on Levophed.  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 Patient was admitted under CCM for septic shock from E. coli and strep bacteremia.    Assessment & Plan:   Active Problems:   Septic shock (HCC)   AKI (acute kidney injury) ([place])   Hypokalemia   Transaminitis   Bacteremia  1-Septic shock secondary to E. coli and strep infantarius bacteremia: Patient presented with hypotension, leukocytosis, hypothermia.  Required pressors which were weaned off on 09/23/2021. Blood cultures 10/5; E. coli and Streptococcus Infantarius.  Repeated blood cultures 10/7: No growth to date. Bacteremia thought to be secondary to bowel translocation. ID consulted recommended total 14 days of antibiotics with end date of 10/08/2021.  Patient was on IV cefazolin, transitioned to cefadroxil on 09/29/2021 per ID recommendations.  She is afebrile with leukocytosis improving. Echo negative for vegetation.  2-Acute hypoxic respiratory failure secondary to critical illness, pulmonary edema: Patient required mechanical ventilation on 10/5 and extubated on 09/26/2021. She received IV diuresis. Patient currently on 2 L oxygen.  We will continue to wean.  Encouraged incentive spirometry. She continues to have cough, flutter valve ordered, continue scheduled guaifenesin. 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  Occlusion is not of clinical significance.  They recommended continue aspirin and adding statin but no a statin ordered.  We will order atorvastatin 20 mg.  AKI: Resolved  Hypokalemia, hypomagnesemia: Resolved  Transaminases: Presume shock liver, resolved and back to normal.  History of CAD, hypertension, hyperlipidemia, diastolic heart failure, right bundle branch block: Continue aspirin.  Acute urinary retention: We will try to remove catheter.  Voiding trial.  Continue Flomax.  Right great toe gout: Toe looks better than yesterday.  Less tender and less swollen.  Continue colchicine.  Anxiety: Continue Xanax, Cymbalta and gabapentin  Deconditioning: PT OT recommended SNF.  TOC on board.  Family to pick up a facility.  Potential discharge in next 1 to 2 days.  Nutrition Problem: Inadequate oral intake Etiology: inability to eat    Signs/Symptoms: NPO status    Interventions: Tube feeding  Estimated body mass index is 42.03 kg/m as calculated from the following:   Height as of this encounter: 4\' 9"  (1.448 m).   Weight as of this encounter: 88.1 kg.   DVT prophylaxis: Heparin Code Status: Full Family Communication: Discussed with daughter  who was at bedside Disposition Plan:  Status is: Inpatient  Remains inpatient appropriate because:IV treatments appropriate due to intensity of illness or inability to take PO  Dispo: The patient is from: Home              Anticipated d/c is to: SNF versus home health  Patient currently is not medically stable to d/c.   Difficult to place patient No  Consultants:  CM admitted patient. Vascular surgery ID  Procedures:  ECHO;   Antimicrobials:    Subjective: Patient seen and examined.  Daughter was also at the bedside.  Patient  stated that her toe is slightly better than yesterday.  She did not have any other specific complaint.  Looked clinically better than yesterday.  Objective: Vitals:   09/30/21 0739 09/30/21 0743 09/30/21 0746 09/30/21 0805  BP: 113/65   105/66  Pulse: 80   81  Resp: 20   (!) 23  Temp:    97.8 F (36.6 C)  TempSrc:    Oral  SpO2: 95% 95% 95% 93%  Weight:      Height:        Intake/Output Summary (Last 24 hours) at 09/30/2021 1240 Last data filed at 09/30/2021 1214 Gross per 24 hour  Intake --  Output 2450 ml  Net -2450 ml    Filed Weights   09/28/21 0500 09/28/21 2243 09/29/21 0436  Weight: 84.5 kg 88.3 kg 88.1 kg    Examination:  General exam: Appears calm and comfortable  Respiratory system: Clear to auscultation. Respiratory effort normal. Cardiovascular system: S1 & S2 heard, RRR. No JVD, murmurs, rubs, gallops or clicks. No pedal edema. Gastrointestinal system: Abdomen is nondistended, soft and nontender. No organomegaly or masses felt. Normal bowel sounds heard. Central nervous system: Alert and oriented. No focal neurological deficits. Extremities: Symmetric 5 x 5 power.  Right great toe slightly swollen and red and warm to touch and tender to touch as well.  Improved compared to yesterday. Skin: No rashes, lesions or ulcers.  Psychiatry: Judgement and insight appear normal. Mood & affect appropriate.   Data Reviewed: I have personally reviewed following labs and imaging studies  CBC: Recent Labs  Lab 09/26/21 0220 09/26/21 1043 09/28/21 0831 09/29/21 0121 09/30/21 0148  WBC 31.2* 26.3* 20.7* 20.8* 17.1*  NEUTROABS  --   --   --   --  14.4*  HGB 10.0* 9.8* 10.9* 10.1* 9.4*  HCT 29.7* 28.7* 33.3* 31.0* 27.3*  MCV 83.7 84.7 87.2 87.3 85.6  PLT 220 226 343 368 194    Basic Metabolic Panel: Recent Labs  Lab 09/23/21 2005 09/24/21 0428 09/24/21 1313 09/25/21 0508 09/26/21 0220 09/26/21 1043 09/28/21 0831 09/29/21 0121 09/30/21 0148  NA  --    < >  133*   < > 137 137 134* 134* 132*  K  --    < > 3.7   < > 3.7 4.0 3.8 3.9 4.2  CL  --    < > 101   < > 101 101 97* 97* 96*  CO2  --    < > 25   < > 25 25 29 27 27   GLUCOSE  --    < > 119*   < > 113* 165* 112* 113* 113*  BUN  --    < > 32*   < > 28* 31* 11 12 8   CREATININE  --    < > 1.01*   < > 0.98 1.01* 0.75 0.67 0.69  CALCIUM  --    < > 8.5*   < > 8.2* 8.3* 8.8* 8.9 8.7*  MG 2.4  --  2.0  --  1.4*  --  1.9  --   --    < > = values in this interval not displayed.    GFR: Estimated Creatinine Clearance:  46.5 mL/min (by C-G formula based on SCr of 0.69 mg/dL). Liver Function Tests: Recent Labs  Lab 09/26/21 0220 09/30/21 0148  AST 33 21  ALT 114* 30  ALKPHOS 191* 114  BILITOT 0.8 0.8  PROT 5.1* 5.6*  ALBUMIN 2.2* 2.4*    No results for input(s): LIPASE, AMYLASE in the last 168 hours.  No results for input(s): AMMONIA in the last 168 hours. Coagulation Profile: No results for input(s): INR, PROTIME in the last 168 hours.  Cardiac Enzymes: No results for input(s): CKTOTAL, CKMB, CKMBINDEX, TROPONINI in the last 168 hours. BNP (last 3 results) No results for input(s): PROBNP in the last 8760 hours. HbA1C: No results for input(s): HGBA1C in the last 72 hours. CBG: Recent Labs  Lab 09/29/21 0435 09/29/21 1302 09/30/21 0017 09/30/21 0358 09/30/21 0810  GLUCAP 124* 117* 129* 114* 103*    Lipid Profile: No results for input(s): CHOL, HDL, LDLCALC, TRIG, CHOLHDL, LDLDIRECT in the last 72 hours. Thyroid Function Tests: No results for input(s): TSH, T4TOTAL, FREET4, T3FREE, THYROIDAB in the last 72 hours. Anemia Panel: No results for input(s): VITAMINB12, FOLATE, FERRITIN, TIBC, IRON, RETICCTPCT in the last 72 hours. Sepsis Labs: No results for input(s): PROCALCITON, LATICACIDVEN in the last 168 hours.   Recent Results (from the past 240 hour(s))  Culture, blood (routine x 2)     Status: Abnormal   Collection Time: 09/22/21  8:10 AM   Specimen: BLOOD  Result Value  Ref Range Status   Specimen Description BLOOD RIGHT ANTECUBITAL  Final   Special Requests   Final    BOTTLES DRAWN AEROBIC AND ANAEROBIC Blood Culture results may not be optimal due to an inadequate volume of blood received in culture bottles   Culture  Setup Time   Final    GRAM POSITIVE COCCI IN BOTH AEROBIC AND ANAEROBIC BOTTLES CRITICAL VALUE NOTED.  VALUE IS CONSISTENT WITH PREVIOUSLY REPORTED AND CALLED VALUE.    Culture (A)  Final    ESCHERICHIA COLI STREPTOCOCCUS INFANTARIUS SUSCEPTIBILITIES PERFORMED ON PREVIOUS CULTURE WITHIN THE LAST 5 DAYS. Performed at [facility], [place] [address], [place], [place] [address]    Report Status 09/25/2021 FINAL  Final  Resp Panel by RT-PCR (Flu A&B, Covid) Nasopharyngeal Swab     Status: None   Collection Time: 09/22/21  8:13 AM   Specimen: Nasopharyngeal Swab; Nasopharyngeal(NP) swabs in vial transport medium  Result Value Ref Range Status   SARS Coronavirus 2 by RT PCR NEGATIVE NEGATIVE Final    Comment: (NOTE) SARS-CoV-2 target nucleic acids are NOT DETECTED.  The SARS-CoV-2 RNA is generally detectable in upper respiratory specimens during the acute phase of infection. The lowest concentration of SARS-CoV-2 viral copies this assay can detect is 138 copies/mL. A negative result does not preclude SARS-Cov-2 infection and should not be used as the sole basis for treatment or other patient management decisions. A negative result may occur with  improper specimen collection/handling, submission of specimen other than nasopharyngeal swab, presence of viral mutation(s) within the areas targeted by this assay, and inadequate number of viral copies(<138 copies/mL). A negative result must be combined with clinical observations, patient history, and epidemiological information. The expected result is Negative.  Fact Sheet for Patients:  EntrepreneurPulse.com.au  Fact Sheet for Healthcare Providers:   IncredibleEmployment.be  This test is no t yet approved or cleared by the [place] FDA and  has been authorized for detection and/or diagnosis of SARS-CoV-2 by FDA under an Emergency Use Authorization (EUA). This  EUA will remain  in effect (meaning this test can be used) for the duration of the COVID-19 declaration under Section 564(b)(1) of the Act, 21 U.S.C.section 360bbb-3(b)(1), unless the authorization is terminated  or revoked sooner.       Influenza A by PCR NEGATIVE NEGATIVE Final   Influenza B by PCR NEGATIVE NEGATIVE Final    Comment: (NOTE) The Xpert Xpress SARS-CoV-2/FLU/RSV plus assay is intended as an aid in the diagnosis of influenza from Nasopharyngeal swab specimens and should not be used as a sole basis for treatment. Nasal washings and aspirates are unacceptable for Xpert Xpress SARS-CoV-2/FLU/RSV testing.  Fact Sheet for Patients: EntrepreneurPulse.com.au  Fact Sheet for Healthcare Providers: IncredibleEmployment.be  This test is not yet approved or cleared by the [place] FDA and has been authorized for detection and/or diagnosis of SARS-CoV-2 by FDA under an Emergency Use Authorization (EUA). This EUA will remain in effect (meaning this test can be used) for the duration of the COVID-19 declaration under Section 564(b)(1) of the Act, 21 U.S.C. section 360bbb-3(b)(1), unless the authorization is terminated or revoked.  Performed at [facility], [place] [address] [place], [place] [address]   Culture, blood (routine x 2)     Status: Abnormal   Collection Time: 09/22/21  8:15 AM   Specimen: BLOOD LEFT FOREARM  Result Value Ref Range Status   Specimen Description BLOOD LEFT FOREARM  Final   Special Requests   Final    BOTTLES DRAWN AEROBIC AND ANAEROBIC Blood Culture results may not be optimal due to an inadequate volume of blood received in culture bottles   Culture  Setup Time   Final     GRAM POSITIVE COCCI GRAM NEGATIVE RODS IN BOTH AEROBIC AND ANAEROBIC BOTTLES CRITICAL RESULT CALLED TO, READ BACK BY AND VERIFIED WITH: PHARMD GREG ABBOTT 09/23/2021@00 :07 BY TW Performed at [facility], [place] [address], [place], [place] [address]    Culture ESCHERICHIA COLI STREPTOCOCCUS INFANTARIUS  (A)  Final   Report Status 09/26/2021 FINAL  Final   Organism ID, Bacteria ESCHERICHIA COLI  Final   Organism ID, Bacteria STREPTOCOCCUS INFANTARIUS  Final      Susceptibility   Escherichia coli - MIC*    AMPICILLIN >=32 RESISTANT Resistant     CEFAZOLIN <=4 SENSITIVE Sensitive     CEFEPIME <=0.12 SENSITIVE Sensitive     CEFTAZIDIME <=1 SENSITIVE Sensitive     CEFTRIAXONE <=0.25 SENSITIVE Sensitive     CIPROFLOXACIN 0.5 SENSITIVE Sensitive     GENTAMICIN >=16 RESISTANT Resistant     IMIPENEM <=0.25 SENSITIVE Sensitive     TRIMETH/SULFA <=20 SENSITIVE Sensitive     AMPICILLIN/SULBACTAM 16 INTERMEDIATE Intermediate     PIP/TAZO <=4 SENSITIVE Sensitive     * ESCHERICHIA COLI   Streptococcus infantarius - MIC*    PENICILLIN <=0.06 SENSITIVE Sensitive     CEFTRIAXONE <=0.12 SENSITIVE Sensitive     ERYTHROMYCIN <=0.12 SENSITIVE Sensitive     LEVOFLOXACIN 2 SENSITIVE Sensitive     VANCOMYCIN 0.5 SENSITIVE Sensitive     * STREPTOCOCCUS INFANTARIUS  Blood Culture ID Panel (Reflexed)     Status: Abnormal   Collection Time: 09/22/21  8:15 AM  Result Value Ref Range Status   Enterococcus faecalis NOT DETECTED NOT DETECTED Final   Enterococcus Faecium NOT DETECTED NOT DETECTED Final   Listeria monocytogenes NOT DETECTED NOT DETECTED Final   Staphylococcus species NOT DETECTED NOT DETECTED Final   Staphylococcus aureus (BCID) NOT DETECTED NOT DETECTED Final  Staphylococcus epidermidis NOT DETECTED NOT DETECTED Final   Staphylococcus lugdunensis NOT DETECTED NOT DETECTED Final   Streptococcus species DETECTED (A) NOT DETECTED Final    Comment: Not Enterococcus species,  Streptococcus agalactiae, Streptococcus pyogenes, or Streptococcus pneumoniae. CRITICAL RESULT CALLED TO, READ BACK BY AND VERIFIED WITH: PHARMD GREG ABBOTT 09/23/2021@00 :07 BY TW    Streptococcus agalactiae NOT DETECTED NOT DETECTED Final   Streptococcus pneumoniae NOT DETECTED NOT DETECTED Final   Streptococcus pyogenes NOT DETECTED NOT DETECTED Final   A.calcoaceticus-baumannii NOT DETECTED NOT DETECTED Final   Bacteroides fragilis NOT DETECTED NOT DETECTED Final   Enterobacterales DETECTED (A) NOT DETECTED Final    Comment: Enterobacterales represent a large order of gram negative bacteria, not a single organism. CRITICAL RESULT CALLED TO, READ BACK BY AND VERIFIED WITH: PHARMD GREG ABBOTT 09/23/2021@00 :07 BY TW    Enterobacter cloacae complex NOT DETECTED NOT DETECTED Final   Escherichia coli DETECTED (A) NOT DETECTED Final    Comment: CRITICAL RESULT CALLED TO, READ BACK BY AND VERIFIED WITH: PHARMD GREG ABBOTT 09/23/2021@00 :07 BY TW    Klebsiella aerogenes NOT DETECTED NOT DETECTED Final   Klebsiella oxytoca NOT DETECTED NOT DETECTED Final   Klebsiella pneumoniae NOT DETECTED NOT DETECTED Final   Proteus species NOT DETECTED NOT DETECTED Final   Salmonella species NOT DETECTED NOT DETECTED Final   Serratia marcescens NOT DETECTED NOT DETECTED Final   Haemophilus influenzae NOT DETECTED NOT DETECTED Final   Neisseria meningitidis NOT DETECTED NOT DETECTED Final   Pseudomonas aeruginosa NOT DETECTED NOT DETECTED Final   Stenotrophomonas maltophilia NOT DETECTED NOT DETECTED Final   Candida albicans NOT DETECTED NOT DETECTED Final   Candida auris NOT DETECTED NOT DETECTED Final   Candida glabrata NOT DETECTED NOT DETECTED Final   Candida krusei NOT DETECTED NOT DETECTED Final   Candida parapsilosis NOT DETECTED NOT DETECTED Final   Candida tropicalis NOT DETECTED NOT DETECTED Final   Cryptococcus neoformans/gattii NOT DETECTED NOT DETECTED Final   CTX-M ESBL NOT DETECTED NOT  DETECTED Final   Carbapenem resistance IMP NOT DETECTED NOT DETECTED Final   Carbapenem resistance KPC NOT DETECTED NOT DETECTED Final   Carbapenem resistance NDM NOT DETECTED NOT DETECTED Final   Carbapenem resist OXA 48 LIKE NOT DETECTED NOT DETECTED Final   Carbapenem resistance VIM NOT DETECTED NOT DETECTED Final    Comment: Performed at [facility], [address], [place], [place] [address]  MRSA Next Gen by PCR, Nasal     Status: None   Collection Time: 09/22/21 11:46 AM   Specimen: Nasal Mucosa; Nasal Swab  Result Value Ref Range Status   MRSA by PCR Next Gen NOT DETECTED NOT DETECTED Final    Comment: (NOTE) The GeneXpert MRSA Assay (FDA approved for NASAL specimens only), is one component of a comprehensive MRSA colonization surveillance program. It is not intended to diagnose MRSA infection nor to guide or monitor treatment for MRSA infections. Test performance is not FDA approved in patients less than 11 years old. Performed at [facility], [place] [address], [place],  [address]   Culture, blood (routine x 2)     Status: None   Collection Time: 09/24/21 10:00 AM   Specimen: BLOOD RIGHT HAND  Result Value Ref Range Status   Specimen Description BLOOD RIGHT HAND  Final   Special Requests   Final    BOTTLES DRAWN AEROBIC ONLY Blood Culture adequate volume   Culture   Final    NO GROWTH 5 DAYS  Performed at [facility], [place] [address], [place], [place] [address]    Report Status 09/29/2021 FINAL  Final  Culture, blood (routine x 2)     Status: None   Collection Time: 09/24/21 10:08 AM   Specimen: BLOOD RIGHT HAND  Result Value Ref Range Status   Specimen Description BLOOD RIGHT HAND  Final   Special Requests   Final    BOTTLES DRAWN AEROBIC ONLY Blood Culture adequate volume   Culture   Final    NO GROWTH 5 DAYS Performed at [facility], [place] [address], [place], [place] [address]    Report Status 09/29/2021 FINAL  Final           Radiology Studies: DG Foot 2 Views Right  Result Date: 09/29/2021 CLINICAL DATA:  Right great toe pain EXAM: RIGHT FOOT - 2 VIEW COMPARISON:  None. FINDINGS: Normal alignment. No fracture or dislocation. Moderate midfoot degenerative arthritis. Vascular calcifications are seen within the right foot. Mild-to-moderate soft tissue swelling of the right forefoot and dorsum of the foot is present, best appreciated on lateral examination. IMPRESSION: Soft tissue swelling.  No acute fracture or dislocation. Moderate midfoot degenerative arthritis. Peripheral vascular disease. Electronically Signed   By: Fidela Salisbury M.D.   On: 09/29/2021 01:45        Scheduled Meds:  ALPRAZolam  0.25 mg Oral TID   arformoterol  15 mcg Nebulization BID   aspirin  81 mg Oral Daily   atorvastatin  20 mg Oral Daily   carvedilol  12.5 mg Oral BID WC   cefadroxil  500 mg Oral BID   chlorhexidine  15 mL Mouth Rinse BID   Chlorhexidine Gluconate Cloth  6 each Topical Q0600   colchicine  0.6 mg Oral Daily   diclofenac Sodium  2 g Topical TID AC & HS   DULoxetine  20 mg Oral Daily   enoxaparin (LOVENOX) injection  40 mg Subcutaneous Q24H   feeding supplement  237 mL Oral BID BM   gabapentin  100 mg Oral QHS   guaiFENesin  600 mg Oral BID   mouth rinse  15 mL Mouth Rinse q12n4p   pantoprazole  40 mg Oral Daily   revefenacin  175 mcg Nebulization Daily   tamsulosin  0.4 mg Oral QPC supper   Continuous Infusions:  sodium chloride Stopped (09/26/21 1408)     LOS: 8 days    Time spent: 30 minutes.   Darliss Cheney, MD [facility]   If 7PM-7AM, please contact night-coverage [email or web address]  09/30/2021, 12:40 PM

## 2021-09-30 NOTE — Plan of Care (Signed)
  Problem: Education: Goal: Knowledge of General Education information will improve Description: Including pain rating scale, medication(s)/side effects and non-pharmacologic comfort measures Outcome: Progressing   Problem: Pain Managment: Goal: General experience of comfort will improve Outcome: Progressing   Problem: Safety: Goal: Ability to remain free from injury will improve Outcome: Progressing   

## 2021-09-30 NOTE — Evaluation (Signed)
Occupational Therapy Evaluation Patient Details Name: Deborah Jordan MRN: [id number] DOB: 1933/11/08 Today's Date: 09/30/2021   History of Present Illness pt is an 87 y/o female admitted 10/5 with intermittent abdominal pain, nausea and vomiting for 4 days.  Pt noted to be severely hypotensive requiring aggressive IV fluid resuscitation.  PMHx: HTN, bil chronic knee pain, deg arthritis of hip, bil sciaticaLVH, CAD, CKD4.   Clinical Impression   Pt admitted with above. She demonstrates the below listed deficits and will benefit from continued OT to maximize safety and independence with BADLs.  Pt presents to OT with decreased activity tolerance, generalized weakness, impaired balance.  She currently requires set up - min A for UB ADLs and mod A for LB ADLs and functional transfers.  PTA, pt was mod I with ADLs        Recommendations for follow up therapy are one component of a multi-disciplinary discharge planning process, led by the attending physician.  Recommendations may be updated based on patient status, additional functional criteria and insurance authorization.   Follow Up Recommendations  SNF    Equipment Recommendations       Recommendations for Other Services       Precautions / Restrictions Precautions Precautions: Fall      Mobility Bed Mobility               General bed mobility comments: up in chair    Transfers Overall transfer level: Needs assistance Equipment used: Rolling walker (2 wheeled);1 person hand held assist Transfers: Sit to/from Stand;Stand Pivot Transfers Sit to Stand: Min assist         General transfer comment: verbal cues for hand placement    Balance Overall balance assessment: Needs assistance Sitting-balance support: Bilateral upper extremity supported;No upper extremity supported;Feet supported Sitting balance-Leahy Scale: Fair     Standing balance support: Bilateral upper extremity supported;During functional  activity Standing balance-Leahy Scale: Poor Standing balance comment: requires UE support                           ADL either performed or assessed with clinical judgement   ADL Overall ADL's : Needs assistance/impaired Eating/Feeding: Independent   Grooming: Wash/dry hands;Wash/dry face;Oral care;Brushing hair;Sitting;Set up   Upper Body Bathing: Set up;Sitting   Lower Body Bathing: Moderate assistance;Sit to/from stand   Upper Body Dressing : Set up;Supervision/safety;Sitting   Lower Body Dressing: Moderate assistance;Sit to/from stand Lower Body Dressing Details (indicate cue type and reason): able to don/doff socks seated.  She is unable to unweight her UEs when standing with RW Toilet Transfer: Moderate assistance;Stand-pivot;Comfort height toilet;RW   Toileting- Clothing Manipulation and Hygiene: Moderate assistance;Sit to/from stand       Functional mobility during ADLs: Moderate assistance;Rolling walker General ADL Comments: Pt fatigues rapidly     Vision Patient Visual Report: No change from baseline       Perception     Praxis      Pertinent Vitals/Pain Pain Assessment: No/denies pain     Hand Dominance Right   Extremity/Trunk Assessment Upper Extremity Assessment Upper Extremity Assessment: Generalized weakness   Lower Extremity Assessment Lower Extremity Assessment: Generalized weakness   Cervical / Trunk Assessment Cervical / Trunk Assessment: Kyphotic   Communication Communication Communication: No difficulties   Cognition Arousal/Alertness: Awake/alert Behavior During Therapy: WFL for tasks assessed/performed Overall Cognitive Status: Within Functional Limits for tasks assessed (Grossly WFL for tasks assessed)  General Comments  spo2 96% with pt on 2L supplemental 02.    Exercises Other Exercises Other Exercises: Pt moved sit to stand x 4 and able to maintain static  standing for 30 seconds - ~1 min   Shoulder Instructions      Home Living Family/patient expects to be discharged to:: Private residence Living Arrangements: Children Available Help at Discharge: Family;Available 24 hours/day;Available PRN/intermittently Type of Home: House Home Access: Stairs to enter [employer] of Steps: 4 Entrance Stairs-Rails: Right;Left Home Layout: Two level;Able to live on main level with bedroom/bathroom     Bathroom Shower/Tub: Tub/shower unit   Bathroom Toilet: Standard Bathroom Accessibility: Yes   Home Equipment: Walker - 4 wheels;Bedside commode;Tub bench          Prior Functioning/Environment Level of Independence: Independent with assistive device(s)                 OT Problem List: Decreased strength;Decreased activity tolerance;Impaired balance (sitting and/or standing);Decreased safety awareness;Decreased knowledge of use of DME or AE;Cardiopulmonary status limiting activity      OT Treatment/Interventions: Self-care/ADL training;Therapeutic exercise;Energy conservation;DME and/or AE instruction;Therapeutic activities;Patient/family education;Balance training    OT Goals(Current goals can be found in the care plan section) Acute Rehab OT Goals Patient Stated Goal: get back home, PLOF OT Goal Formulation: With patient/family Time For Goal Achievement: 10/14/21 Potential to Achieve Goals: Good ADL Goals Pt Will Perform Grooming: standing;with min assist Pt Will Perform Lower Body Bathing: with min assist;sit to/from stand Pt Will Perform Lower Body Dressing: with min assist;sit to/from stand Pt Will Transfer to Toilet: with min assist;ambulating;regular height toilet;bedside commode;grab bars Pt Will Perform Toileting - Clothing Manipulation and hygiene: with min assist;sit to/from stand  OT Frequency: Min 2X/week   Barriers to D/C:            Co-evaluation              AM-PAC OT "6 Clicks" Daily Activity      Outcome Measure Help from another person eating meals?: None Help from another person taking care of personal grooming?: A Little Help from another person toileting, which includes using toliet, bedpan, or urinal?: A Lot Help from another person bathing (including washing, rinsing, drying)?: A Lot Help from another person to put on and taking off regular upper body clothing?: A Little Help from another person to put on and taking off regular lower body clothing?: A Lot 6 Click Score: 16   End of Session Equipment Utilized During Treatment: Rolling walker;Gait belt;Oxygen Nurse Communication: Mobility status  Activity Tolerance: Patient tolerated treatment well Patient left: in chair;with call bell/phone within reach;with family/visitor present;with chair alarm set  OT Visit Diagnosis: Unsteadiness on feet (R26.81)                Time: 0037-0488 OT Time Calculation (min): 34 min Charges:  OT General Charges $OT Visit: 1 Visit OT Evaluation $OT Eval Moderate Complexity: 1 Mod OT Treatments $Therapeutic Activity: 8-22 mins  Nilsa Nutting., OTR/L Acute Rehabilitation Services Pager [phone] Office [phone]   Lucille Passy M 09/30/2021, 3:05 PM

## 2021-10-01 DIAGNOSIS — N179 Acute kidney failure, unspecified: Secondary | ICD-10-CM | POA: Diagnosis not present

## 2021-10-01 NOTE — Plan of Care (Signed)

## 2021-10-01 NOTE — Progress Notes (Signed)
Pt refused Bipap QHS.

## 2021-10-01 NOTE — Progress Notes (Addendum)
Bladder scan at 2000 showed >44mL. Foley replaced per order with return of 684mL clear yellow urine.

## 2021-10-01 NOTE — Progress Notes (Signed)
Physical Therapy Treatment Patient Details Name: BLAYKE PINERA MRN: [id number] DOB: 05-Oct-1933 Today's Date: 10/01/2021   History of Present Illness pt is an 87 y/o female admitted 10/5 with intermittent abdominal pain, nausea and vomiting for 4 days.  Pt noted to be severely hypotensive requiring aggressive IV fluid resuscitation. Noted R great toe gout. PMHx: HTN, bil chronic knee pain, deg arthritis of hip, bil sciaticaLVH, CAD, CKD4.    PT Comments    Pt is making good progress with mobility, ambulating up to ~24 ft with her rollator this date. However, she displays an antalgic gait pattern and fatigues quickly, needing minA to step anteriorly but modA to prevent LOB when stepping posteriorly to access her seat. She remains at high risk for falls and, at this time, would benefit from short-term rehab at a SNF to maximize her independence and safety with mobility prior to returning home. Will continue to follow acutely.    Recommendations for follow up therapy are one component of a multi-disciplinary discharge planning process, led by the attending physician.  Recommendations may be updated based on patient status, additional functional criteria and insurance authorization.  Follow Up Recommendations  SNF;Supervision/Assistance - 24 hour     Equipment Recommendations  None recommended by PT    Recommendations for Other Services       Precautions / Restrictions Precautions Precautions: Fall Restrictions Weight Bearing Restrictions: No     Mobility  Bed Mobility               General bed mobility comments: up in chair    Transfers Overall transfer level: Needs assistance Equipment used: 4-wheeled walker Transfers: Sit to/from Stand Sit to Stand: Min guard         General transfer comment: Min guard assist for safety with transfer to stand. Cues for hand placement.  Ambulation/Gait Ambulation/Gait assistance: Min assist;Mod assist Gait Distance (Feet): 24  Feet (x2 bouts of ~6 ft > ~24 ft) Assistive device: Rolling walker (2 wheeled) Gait Pattern/deviations: Step-to pattern;Decreased step length - left;Decreased stance time - right;Decreased stride length;Trunk flexed;Decreased dorsiflexion - left;Antalgic Gait velocity: reduced Gait velocity interpretation: <1.31 ft/sec, indicative of household ambulator General Gait Details: Pt with antalgic gait due to R big toe pain. Pt with increased trunk flexion as pt fatigued and when stepping posteriorly, needing repeated tactile and verbal cues to improve her posture between steps. MinA to steady with gait anteriorly but modA for stepping posteriorly.   Stairs             Wheelchair Mobility    Modified Rankin (Stroke Patients Only)       Balance Overall balance assessment: Needs assistance Sitting-balance support: No upper extremity supported;Feet supported Sitting balance-Leahy Scale: Fair     Standing balance support: Bilateral upper extremity supported;During functional activity Standing balance-Leahy Scale: Poor Standing balance comment: requires UE support                            Cognition Arousal/Alertness: Awake/alert Behavior During Therapy: WFL for tasks assessed/performed Overall Cognitive Status: Within Functional Limits for tasks assessed                                        Exercises General Exercises - Lower Extremity Long Arc Quad: Strengthening;Both;10 reps;Seated;Other (comment) (with pt's personal red t-band) Hip ABduction/ADduction: Strengthening;Both;10 reps;Seated (with pt's personal  red t-band) Hip Flexion/Marching: Strengthening;Both;10 reps;Seated (with pt's personal red t-band) Toe Raises: Strengthening;Both;10 reps;Seated (with pt's personal red t-band)    General Comments General comments (skin integrity, edema, etc.): SpO2 >/= 92% on RA, HR in 80s; educated pt on UE exercises with red t-band      Pertinent  Vitals/Pain Pain Assessment: Faces Faces Pain Scale: Hurts little more Pain Location: R toe from gout Pain Descriptors / Indicators: Discomfort;Grimacing;Guarding Pain Intervention(s): Limited activity within patient's tolerance;Monitored during session;Repositioned    Home Living                      Prior Function            PT Goals (current goals can now be found in the care plan section) Acute Rehab PT Goals Patient Stated Goal: get back home, PLOF Time For Goal Achievement: 10/11/21 Potential to Achieve Goals: Good Progress towards PT goals: Progressing toward goals    Frequency    Min 3X/week      PT Plan Current plan remains appropriate    Co-evaluation              AM-PAC PT "6 Clicks" Mobility   Outcome Measure  Help needed turning from your back to your side while in a flat bed without using bedrails?: A Lot Help needed moving from lying on your back to sitting on the side of a flat bed without using bedrails?: A Lot Help needed moving to and from a bed to a chair (including a wheelchair)?: A Little Help needed standing up from a chair using your arms (e.g., wheelchair or bedside chair)?: A Little Help needed to walk in hospital room?: A Lot Help needed climbing 3-5 steps with a railing? : Total 6 Click Score: 13    End of Session Equipment Utilized During Treatment: Gait belt Activity Tolerance: Patient limited by pain;Patient limited by fatigue Patient left: in chair;with call bell/phone within reach   PT Visit Diagnosis: Other abnormalities of gait and mobility (R26.89);Muscle weakness (generalized) (M62.81);Difficulty in walking, not elsewhere classified (R26.2);Unsteadiness on feet (R26.81);Pain Pain - Right/Left: Right Pain - part of body: Ankle and joints of foot     Time: 0912-0932 PT Time Calculation (min) (ACUTE ONLY): 20 min  Charges:  $Gait Training: 8-22 mins                     Moishe Spice, PT, DPT Acute  Rehabilitation Services  Pager: [phone] Office: [place] 10/01/2021, 10:53 AM

## 2021-10-01 NOTE — Progress Notes (Addendum)
PROGRESS NOTE    Deborah Jordan  GQQ:[id number] DOB: [birth year] DOA: 09/22/2021 PCP: [name], MD   Brief Narrative: 87 year old with past medical history significant for hyponatremia, hypertension, bilateral chronic knee pain, who presents to [name] ED on 10/5 with abdominal pain for 3 to 4 days.  EMS was called and patient was found to be hypotensive blood pressure in the 80s.  Patient received 2 L of IV fluids and she was a started on Levophed.  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 Patient was admitted under CCM for septic shock from E. coli and strep bacteremia.    Assessment & Plan:   Active Problems:   Septic shock (HCC)   AKI (acute kidney injury) ([place])   Hypokalemia   Transaminitis   Bacteremia  1-Septic shock secondary to E. coli and strep infantarius bacteremia: Patient presented with hypotension, leukocytosis, hypothermia.  Required pressors which were weaned off on 09/23/2021. Blood cultures 10/5; E. coli and Streptococcus Infantarius.  Repeated blood cultures 10/7: No growth to date. Bacteremia thought to be secondary to bowel translocation. ID consulted recommended total 14 days of antibiotics with end date of 10/08/2021.  Patient was on IV cefazolin, transitioned to cefadroxil on 09/29/2021 per ID recommendations.  She is afebrile with leukocytosis improving. Echo negative for vegetation.  2-Acute hypoxic respiratory failure secondary to critical illness, pulmonary edema: Patient required mechanical ventilation on 10/5 and extubated on 09/26/2021. She received IV diuresis. Patient currently on room air and saturating 95%.  Encouraged incentive in summary, continue Brovana, and flutter valve.  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  Occlusion is not of clinical significance.  They recommended continue aspirin and adding statin but no a statin ordered.  We will order atorvastatin 20 mg.  AKI: Resolved  Hypokalemia, hypomagnesemia: Resolved  Transaminases: Presume shock liver, resolved and back to normal.  History of CAD, hypertension, hyperlipidemia, diastolic heart failure, right bundle branch block: Continue aspirin.  Acute urinary retention: Catheter removed yesterday.  Voiding well but still has significant postvoid amount in bladder scan.  Required in an hour x1 last night.  Will monitor closely, will likely require indwelling Foley catheter again.  Continue Flomax.  Right great toe gout: Improving.  Continue colchicine  Anxiety: Continue Xanax, Cymbalta and gabapentin  Deconditioning: PT OT recommended SNF.  TOC on board.  Family to pick up a facility.  Potential discharge in next 1 to 2 days.  Nutrition Problem: Inadequate oral intake Etiology: inability to eat    Signs/Symptoms: NPO status    Interventions: Tube feeding  Estimated body mass index is 39.84 kg/m as calculated from the following:   Height as of this encounter: 4\' 9"  (1.448 m).   Weight as of this encounter: 83.5 kg.   DVT prophylaxis: Heparin Code Status: Full Family Communication: None at bedside. Disposition Plan:  Status is: Inpatient  Remains inpatient appropriate because:IV treatments appropriate due to intensity of illness or inability to take PO  Dispo: The patient is from: Home              Anticipated d/c is to: SNF versus home health              Patient  currently is not medically stable to d/c.   Difficult to place patient No  Consultants:  CM admitted patient. Vascular surgery ID  Procedures:  ECHO;   Antimicrobials:    Subjective: Patient seen and examined.  She states that her foot pain is  improving.  She has no other complaint. Objective: Vitals:   10/01/21 0452 10/01/21 0453 10/01/21 0825 10/01/21 0839  BP: (!) 146/58  (!) 153/66   Pulse: 77  77   Resp: (!) 21  19   Temp: 98 F (36.7 C)     TempSrc: Oral  Oral   SpO2: 92%  93% 99%  Weight:  83.5 kg    Height:        Intake/Output Summary (Last 24 hours) at 10/01/2021 0959 Last data filed at 10/01/2021 0644 Gross per 24 hour  Intake 520 ml  Output 1000 ml  Net -480 ml    Filed Weights   09/28/21 2243 09/29/21 0436 10/01/21 0453  Weight: 88.3 kg 88.1 kg 83.5 kg    Examination:  General exam: Appears calm and comfortable  Respiratory system: Clear to auscultation. Respiratory effort normal. Cardiovascular system: S1 & S2 heard, RRR. No JVD, murmurs, rubs, gallops or clicks. No pedal edema. Gastrointestinal system: Abdomen is nondistended, soft and nontender. No organomegaly or masses felt. Normal bowel sounds heard. Central nervous system: Alert and oriented. No focal neurological deficits. Extremities: Symmetric 5 x 5 power.  Right great toe is slightly swollen but improved compared to yesterday.  Less tender and less warmth to touch. Skin: No rashes, lesions or ulcers.  Psychiatry: Judgement and insight appear normal. Mood & affect appropriate.    Data Reviewed: I have personally reviewed following labs and imaging studies  CBC: Recent Labs  Lab 09/26/21 0220 09/26/21 1043 09/28/21 0831 09/29/21 0121 09/30/21 0148  WBC 31.2* 26.3* 20.7* 20.8* 17.1*  NEUTROABS  --   --   --   --  14.4*  HGB 10.0* 9.8* 10.9* 10.1* 9.4*  HCT 29.7* 28.7* 33.3* 31.0* 27.3*  MCV 83.7 84.7 87.2 87.3 85.6  PLT 220 226 343 368 786    Basic Metabolic Panel: Recent Labs  Lab 09/24/21 1313 09/25/21 0508 09/26/21 0220 09/26/21 1043 09/28/21 0831 09/29/21 0121 09/30/21 0148  NA 133*   < > 137 137 134* 134* 132*  K 3.7   < > 3.7 4.0 3.8 3.9 4.2  CL 101   < > 101 101 97* 97* 96*  CO2 25   < > 25 25 29 27 27    GLUCOSE 119*   < > 113* 165* 112* 113* 113*  BUN 32*   < > 28* 31* 11 12 8   CREATININE 1.01*   < > 0.98 1.01* 0.75 0.67 0.69  CALCIUM 8.5*   < > 8.2* 8.3* 8.8* 8.9 8.7*  MG 2.0  --  1.4*  --  1.9  --   --    < > = values in this interval not displayed.    GFR: Estimated Creatinine Clearance: 45.1 mL/min (by C-G formula based on SCr of 0.69 mg/dL). Liver Function Tests: Recent Labs  Lab 09/26/21 0220 09/30/21 0148  AST 33 21  ALT 114* 30  ALKPHOS 191* 114  BILITOT 0.8 0.8  PROT 5.1* 5.6*  ALBUMIN 2.2* 2.4*    No results for input(s): LIPASE, AMYLASE in the last 168 hours.  No results for input(s): AMMONIA in the last 168 hours. Coagulation Profile: No results for input(s): INR, PROTIME in the last  168 hours.  Cardiac Enzymes: No results for input(s): CKTOTAL, CKMB, CKMBINDEX, TROPONINI in the last 168 hours. BNP (last 3 results) No results for input(s): PROBNP in the last 8760 hours. HbA1C: No results for input(s): HGBA1C in the last 72 hours. CBG: Recent Labs  Lab 09/30/21 0358 09/30/21 0810 09/30/21 1258 09/30/21 1535 09/30/21 2101  GLUCAP 114* 103* 109* 118* 105*    Lipid Profile: No results for input(s): CHOL, HDL, LDLCALC, TRIG, CHOLHDL, LDLDIRECT in the last 72 hours. Thyroid Function Tests: No results for input(s): TSH, T4TOTAL, FREET4, T3FREE, THYROIDAB in the last 72 hours. Anemia Panel: No results for input(s): VITAMINB12, FOLATE, FERRITIN, TIBC, IRON, RETICCTPCT in the last 72 hours. Sepsis Labs: No results for input(s): PROCALCITON, LATICACIDVEN in the last 168 hours.   Recent Results (from the past 240 hour(s))  Culture, blood (routine x 2)     Status: Abnormal   Collection Time: 09/22/21  8:10 AM   Specimen: BLOOD  Result Value Ref Range Status   Specimen Description BLOOD RIGHT ANTECUBITAL  Final   Special Requests   Final    BOTTLES DRAWN AEROBIC AND ANAEROBIC Blood Culture results may not be optimal due to an inadequate volume of blood  received in culture bottles   Culture  Setup Time   Final    GRAM POSITIVE COCCI IN BOTH AEROBIC AND ANAEROBIC BOTTLES CRITICAL VALUE NOTED.  VALUE IS CONSISTENT WITH PREVIOUSLY REPORTED AND CALLED VALUE.    Culture (A)  Final    ESCHERICHIA COLI STREPTOCOCCUS INFANTARIUS SUSCEPTIBILITIES PERFORMED ON PREVIOUS CULTURE WITHIN THE LAST 5 DAYS. Performed at [facility], [place] [address] [place], [place] [address]    Report Status 09/25/2021 FINAL  Final  Resp Panel by RT-PCR (Flu A&B, Covid) Nasopharyngeal Swab     Status: None   Collection Time: 09/22/21  8:13 AM   Specimen: Nasopharyngeal Swab; Nasopharyngeal(NP) swabs in vial transport medium  Result Value Ref Range Status   SARS Coronavirus 2 by RT PCR NEGATIVE NEGATIVE Final    Comment: (NOTE) SARS-CoV-2 target nucleic acids are NOT DETECTED.  The SARS-CoV-2 RNA is generally detectable in upper respiratory specimens during the acute phase of infection. The lowest concentration of SARS-CoV-2 viral copies this assay can detect is 138 copies/mL. A negative result does not preclude SARS-Cov-2 infection and should not be used as the sole basis for treatment or other patient management decisions. A negative result may occur with  improper specimen collection/handling, submission of specimen other than nasopharyngeal swab, presence of viral mutation(s) within the areas targeted by this assay, and inadequate number of viral copies(<138 copies/mL). A negative result must be combined with clinical observations, patient history, and epidemiological information. The expected result is Negative.  Fact Sheet for Patients:  EntrepreneurPulse.com.au  Fact Sheet for Healthcare Providers:  IncredibleEmployment.be  This test is no t yet approved or cleared by the [place] FDA and  has been authorized for detection and/or diagnosis of SARS-CoV-2 by FDA under an Emergency Use Authorization (EUA).  This EUA will remain  in effect (meaning this test can be used) for the duration of the COVID-19 declaration under Section 564(b)(1) of the Act, 21 U.S.C.section 360bbb-3(b)(1), unless the authorization is terminated  or revoked sooner.       Influenza A by PCR NEGATIVE NEGATIVE Final   Influenza B by PCR NEGATIVE NEGATIVE Final    Comment: (NOTE) The Xpert Xpress SARS-CoV-2/FLU/RSV plus assay is intended as an aid in the diagnosis of influenza from Nasopharyngeal  swab specimens and should not be used as a sole basis for treatment. Nasal washings and aspirates are unacceptable for Xpert Xpress SARS-CoV-2/FLU/RSV testing.  Fact Sheet for Patients: EntrepreneurPulse.com.au  Fact Sheet for Healthcare Providers: IncredibleEmployment.be  This test is not yet approved or cleared by the [place] FDA and has been authorized for detection and/or diagnosis of SARS-CoV-2 by FDA under an Emergency Use Authorization (EUA). This EUA will remain in effect (meaning this test can be used) for the duration of the COVID-19 declaration under Section 564(b)(1) of the Act, 21 U.S.C. section 360bbb-3(b)(1), unless the authorization is terminated or revoked.  Performed at [facility], [place] [address] [place], [place] [address]   Culture, blood (routine x 2)     Status: Abnormal   Collection Time: 09/22/21  8:15 AM   Specimen: BLOOD LEFT FOREARM  Result Value Ref Range Status   Specimen Description BLOOD LEFT FOREARM  Final   Special Requests   Final    BOTTLES DRAWN AEROBIC AND ANAEROBIC Blood Culture results may not be optimal due to an inadequate volume of blood received in culture bottles   Culture  Setup Time   Final    GRAM POSITIVE COCCI GRAM NEGATIVE RODS IN BOTH AEROBIC AND ANAEROBIC BOTTLES CRITICAL RESULT CALLED TO, READ BACK BY AND VERIFIED WITH: PHARMD GREG ABBOTT 09/23/2021@00 :07 BY TW Performed at [facility], [place] [address] [place], [place] [address]    Culture ESCHERICHIA COLI STREPTOCOCCUS INFANTARIUS  (A)  Final   Report Status 09/26/2021 FINAL  Final   Organism ID, Bacteria ESCHERICHIA COLI  Final   Organism ID, Bacteria STREPTOCOCCUS INFANTARIUS  Final      Susceptibility   Escherichia coli - MIC*    AMPICILLIN >=32 RESISTANT Resistant     CEFAZOLIN <=4 SENSITIVE Sensitive     CEFEPIME <=0.12 SENSITIVE Sensitive     CEFTAZIDIME <=1 SENSITIVE Sensitive     CEFTRIAXONE <=0.25 SENSITIVE Sensitive     CIPROFLOXACIN 0.5 SENSITIVE Sensitive     GENTAMICIN >=16 RESISTANT Resistant     IMIPENEM <=0.25 SENSITIVE Sensitive     TRIMETH/SULFA <=20 SENSITIVE Sensitive     AMPICILLIN/SULBACTAM 16 INTERMEDIATE Intermediate     PIP/TAZO <=4 SENSITIVE Sensitive     * ESCHERICHIA COLI   Streptococcus infantarius - MIC*    PENICILLIN <=0.06 SENSITIVE Sensitive     CEFTRIAXONE <=0.12 SENSITIVE Sensitive     ERYTHROMYCIN <=0.12 SENSITIVE Sensitive     LEVOFLOXACIN 2 SENSITIVE Sensitive     VANCOMYCIN 0.5 SENSITIVE Sensitive     * STREPTOCOCCUS INFANTARIUS  Blood Culture ID Panel (Reflexed)     Status: Abnormal   Collection Time: 09/22/21  8:15 AM  Result Value Ref Range Status   Enterococcus faecalis NOT DETECTED NOT DETECTED Final   Enterococcus Faecium NOT DETECTED NOT DETECTED Final   Listeria monocytogenes NOT DETECTED NOT DETECTED Final   Staphylococcus species NOT DETECTED NOT DETECTED Final   Staphylococcus aureus (BCID) NOT DETECTED NOT DETECTED Final   Staphylococcus epidermidis NOT DETECTED NOT DETECTED Final   Staphylococcus lugdunensis NOT DETECTED NOT DETECTED Final   Streptococcus species DETECTED (A) NOT DETECTED Final    Comment: Not Enterococcus species, Streptococcus agalactiae, Streptococcus pyogenes, or Streptococcus pneumoniae. CRITICAL RESULT CALLED TO, READ BACK BY AND VERIFIED WITH: PHARMD GREG ABBOTT 09/23/2021@00 :07 BY TW    Streptococcus agalactiae NOT DETECTED NOT DETECTED Final    Streptococcus pneumoniae NOT DETECTED NOT DETECTED Final   Streptococcus pyogenes NOT DETECTED NOT  DETECTED Final   A.calcoaceticus-baumannii NOT DETECTED NOT DETECTED Final   Bacteroides fragilis NOT DETECTED NOT DETECTED Final   Enterobacterales DETECTED (A) NOT DETECTED Final    Comment: Enterobacterales represent a large order of gram negative bacteria, not a single organism. CRITICAL RESULT CALLED TO, READ BACK BY AND VERIFIED WITH: PHARMD GREG ABBOTT 09/23/2021@00 :07 BY TW    Enterobacter cloacae complex NOT DETECTED NOT DETECTED Final   Escherichia coli DETECTED (A) NOT DETECTED Final    Comment: CRITICAL RESULT CALLED TO, READ BACK BY AND VERIFIED WITH: PHARMD GREG ABBOTT 09/23/2021@00 :07 BY TW    Klebsiella aerogenes NOT DETECTED NOT DETECTED Final   Klebsiella oxytoca NOT DETECTED NOT DETECTED Final   Klebsiella pneumoniae NOT DETECTED NOT DETECTED Final   Proteus species NOT DETECTED NOT DETECTED Final   Salmonella species NOT DETECTED NOT DETECTED Final   Serratia marcescens NOT DETECTED NOT DETECTED Final   Haemophilus influenzae NOT DETECTED NOT DETECTED Final   Neisseria meningitidis NOT DETECTED NOT DETECTED Final   Pseudomonas aeruginosa NOT DETECTED NOT DETECTED Final   Stenotrophomonas maltophilia NOT DETECTED NOT DETECTED Final   Candida albicans NOT DETECTED NOT DETECTED Final   Candida auris NOT DETECTED NOT DETECTED Final   Candida glabrata NOT DETECTED NOT DETECTED Final   Candida krusei NOT DETECTED NOT DETECTED Final   Candida parapsilosis NOT DETECTED NOT DETECTED Final   Candida tropicalis NOT DETECTED NOT DETECTED Final   Cryptococcus neoformans/gattii NOT DETECTED NOT DETECTED Final   CTX-M ESBL NOT DETECTED NOT DETECTED Final   Carbapenem resistance IMP NOT DETECTED NOT DETECTED Final   Carbapenem resistance KPC NOT DETECTED NOT DETECTED Final   Carbapenem resistance NDM NOT DETECTED NOT DETECTED Final   Carbapenem resist OXA 48 LIKE NOT DETECTED NOT  DETECTED Final   Carbapenem resistance VIM NOT DETECTED NOT DETECTED Final    Comment: Performed at [facility], [address] [place], [place] [address]  MRSA Next Gen by PCR, Nasal     Status: None   Collection Time: 09/22/21 11:46 AM   Specimen: Nasal Mucosa; Nasal Swab  Result Value Ref Range Status   MRSA by PCR Next Gen NOT DETECTED NOT DETECTED Final    Comment: (NOTE) The GeneXpert MRSA Assay (FDA approved for NASAL specimens only), is one component of a comprehensive MRSA colonization surveillance program. It is not intended to diagnose MRSA infection nor to guide or monitor treatment for MRSA infections. Test performance is not FDA approved in patients less than 24 years old. Performed at [facility], [place] [address], [place], [place] [address]   Culture, blood (routine x 2)     Status: None   Collection Time: 09/24/21 10:00 AM   Specimen: BLOOD RIGHT HAND  Result Value Ref Range Status   Specimen Description BLOOD RIGHT HAND  Final   Special Requests   Final    BOTTLES DRAWN AEROBIC ONLY Blood Culture adequate volume   Culture   Final    NO GROWTH 5 DAYS Performed at [facility], [place] [address], [place], [place] [address]    Report Status 09/29/2021 FINAL  Final  Culture, blood (routine x 2)     Status: None   Collection Time: 09/24/21 10:08 AM   Specimen: BLOOD RIGHT HAND  Result Value Ref Range Status   Specimen Description BLOOD RIGHT HAND  Final   Special Requests   Final    BOTTLES DRAWN AEROBIC ONLY Blood Culture adequate volume  Culture   Final    NO GROWTH 5 DAYS Performed at [facility], [place] [address], [place],  [address]    Report Status 09/29/2021 FINAL  Final          Radiology Studies: No results found.      Scheduled Meds:  ALPRAZolam  0.25 mg Oral TID   arformoterol  15 mcg Nebulization BID   aspirin  81 mg Oral Daily   atorvastatin  20 mg Oral Daily   carvedilol  12.5 mg Oral BID WC    cefadroxil  500 mg Oral BID   chlorhexidine  15 mL Mouth Rinse BID   Chlorhexidine Gluconate Cloth  6 each Topical Q0600   colchicine  0.6 mg Oral Daily   diclofenac Sodium  2 g Topical TID AC & HS   DULoxetine  20 mg Oral Daily   enoxaparin (LOVENOX) injection  40 mg Subcutaneous Q24H   feeding supplement  237 mL Oral BID BM   gabapentin  100 mg Oral QHS   guaiFENesin  600 mg Oral BID   mouth rinse  15 mL Mouth Rinse q12n4p   pantoprazole  40 mg Oral Daily   revefenacin  175 mcg Nebulization Daily   tamsulosin  0.4 mg Oral QPC supper   Continuous Infusions:  sodium chloride Stopped (09/26/21 1408)     LOS: 9 days    Time spent: 28 minutes.   Darliss Cheney, MD [facility]   If 7PM-7AM, please contact night-coverage [email or web address]  10/01/2021, 9:59 AM

## 2021-10-01 NOTE — Progress Notes (Signed)
Patient bladder scanned throughout the night to assess for retention post Foley removal. All bladder scan volumes <339mL. Post void residual scan at 0604 revealed 333mL. Order obtained for I&O cath x1. I&O performed with return of 760mL of clear yellow urine

## 2021-10-01 NOTE — TOC Progression Note (Signed)
Transition of Care [facility]) - Progression Note    Patient Details  Name: KAE LAUMAN MRN: [id number] Date of Birth: 07-10-1933  Transition of Care [facility]) CM/SW [place], LCSW Phone Number: 10/01/2021, 5:30 PM  Clinical Narrative:    11am-[facility] unsure if they will be able to accept patients over the weekend. Per daughter, second choice would be [facility].  12pm-CSW received call back from daughter stating second choice is actually [facility].  3pm-[facility] unable to accept patient until Monday. CSW checked with other bed offers of [facility], [facility], [facility], and [facility] and they are all unable to accept patient until Monday.  CSW intern spoke with patient's daughter and she stated that [facility] would be their first preference. CSW uploaded clinicals to [facility] for review. Auth approved, Ref# [id number], effective from 10/02/2021-10/05/2021.   Expected Discharge Plan: [place] Barriers to Discharge: No Barriers Identified  Expected Discharge Plan and Services Expected Discharge Plan: [place] In-house Referral: Clinical Social Work   Post Acute Care Choice: [place] Living arrangements for the past 2 months: Single Family Home                                       Social Determinants of Health (SDOH) Interventions    Readmission Risk Interventions No flowsheet data found.

## 2021-10-02 DIAGNOSIS — N179 Acute kidney failure, unspecified: Secondary | ICD-10-CM | POA: Diagnosis not present

## 2021-10-02 LAB — CBC WITH DIFFERENTIAL/PLATELET
Abs Immature Granulocytes: 0.38 10*3/uL — ABNORMAL HIGH (ref 0.00–0.07)
Basophils Absolute: 0 10*3/uL (ref 0.0–0.1)
Basophils Relative: 0 %
Eosinophils Absolute: 0.2 10*3/uL (ref 0.0–0.5)
Eosinophils Relative: 1 %
HCT: 26.6 % — ABNORMAL LOW (ref 36.0–46.0)
Hemoglobin: 8.9 g/dL — ABNORMAL LOW (ref 12.0–15.0)
Immature Granulocytes: 3 %
Lymphocytes Relative: 18 %
Lymphs Abs: 2.1 10*3/uL (ref 0.7–4.0)
MCH: 28.7 pg (ref 26.0–34.0)
MCHC: 33.5 g/dL (ref 30.0–36.0)
MCV: 85.8 fL (ref 80.0–100.0)
Monocytes Absolute: 1.5 10*3/uL — ABNORMAL HIGH (ref 0.1–1.0)
Monocytes Relative: 13 %
Neutro Abs: 7.3 10*3/uL (ref 1.7–7.7)
Neutrophils Relative %: 65 %
Platelets: 519 10*3/uL — ABNORMAL HIGH (ref 150–400)
RBC: 3.1 MIL/uL — ABNORMAL LOW (ref 3.87–5.11)
RDW: 14.4 % (ref 11.5–15.5)
WBC: 11.6 10*3/uL — ABNORMAL HIGH (ref 4.0–10.5)
nRBC: 0 % (ref 0.0–0.2)

## 2021-10-02 LAB — BASIC METABOLIC PANEL
Anion gap: 7 (ref 5–15)
BUN: 8 mg/dL (ref 8–23)
CO2: 27 mmol/L (ref 22–32)
Calcium: 8.5 mg/dL — ABNORMAL LOW (ref 8.9–10.3)
Chloride: 98 mmol/L (ref 98–111)
Creatinine, Ser: 0.65 mg/dL (ref 0.44–1.00)
GFR, Estimated: >60 mL/min (ref >60)
Glucose, Bld: 103 mg/dL — ABNORMAL HIGH (ref 70–99)
Potassium: 3.7 mmol/L (ref 3.5–5.1)
Sodium: 132 mmol/L — ABNORMAL LOW (ref 135–145)

## 2021-10-02 LAB — MAGNESIUM: Magnesium: 1.4 mg/dL — ABNORMAL LOW (ref 1.7–2.4)

## 2021-10-02 MED ORDER — ALBUTEROL SULFATE (2.5 MG/3ML) 0.083% IN NEBU
2.5000 mg | INHALATION_SOLUTION | RESPIRATORY_TRACT | Status: DC | PRN
  Administered 2021-10-02: 2.5 mg via RESPIRATORY_TRACT
  Filled 2021-10-02: qty 3

## 2021-10-02 MED ORDER — MAGNESIUM SULFATE 2 GM/50ML IV SOLN
2.0000 g | Freq: Once | INTRAVENOUS | Status: AC
  Administered 2021-10-02: 2 g via INTRAVENOUS
  Filled 2021-10-02: qty 50

## 2021-10-02 MED ORDER — ALBUTEROL SULFATE HFA 108 (90 BASE) MCG/ACT IN AERS: 1.0000 | INHALATION_SPRAY | Freq: Four times a day (QID) | RESPIRATORY_TRACT | Status: DC | PRN

## 2021-10-02 NOTE — Plan of Care (Signed)

## 2021-10-02 NOTE — Progress Notes (Signed)
Pt refused BIPAP tonight 

## 2021-10-02 NOTE — Progress Notes (Addendum)
PROGRESS NOTE    Deborah Jordan  ZHY:[id number] DOB: 1933/04/27 DOA: 09/22/2021 PCP: [name], MD   Brief Narrative: 87 year old with past medical history significant for hyponatremia, hypertension, bilateral chronic knee pain, who presents to [name], ED on 10/5 with abdominal pain for 3 to 4 days.  EMS was called and patient was found to be hypotensive blood pressure in the 80s.  Patient received 2 L of IV fluids and she was a started on Levophed.  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 Patient was admitted under CCM for septic shock from E. coli and strep bacteremia.    Assessment & Plan:   Active Problems:   Septic shock (HCC)   AKI (acute kidney injury) ([place])   Hypokalemia   Transaminitis   Bacteremia  1-Septic shock secondary to E. coli and strep infantarius bacteremia: Patient presented with hypotension, leukocytosis, hypothermia.  Required pressors which were weaned off on 09/23/2021. Blood cultures 10/5; E. coli and Streptococcus Infantarius.  Repeated blood cultures 10/7: No growth to date. Bacteremia thought to be secondary to bowel translocation. ID consulted recommended total 14 days of antibiotics with end date of 10/08/2021.  Patient was on IV cefazolin, transitioned to cefadroxil on 09/29/2021 per ID recommendations.  She is afebrile with leukocytosis improving. Echo negative for vegetation.  2-Acute hypoxic respiratory failure secondary to critical illness, pulmonary edema: Patient required mechanical ventilation on 10/5 and extubated on 09/26/2021. She received IV diuresis. Patient currently on room air and saturating 95%.  Encouraged incentive spirometry, continue Brovana, and flutter valve.  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  Occlusion is not of clinical significance.  They recommended continue aspirin and adding statin but no a statin ordered.  We will order atorvastatin 20 mg.  AKI: Resolved  Hypokalemia, hypomagnesemia: Resolved  Transaminases: Presume shock liver, resolved and back to normal.  History of CAD, hypertension, hyperlipidemia, diastolic heart failure, right bundle branch block: Continue aspirin.  Acute urinary retention: Issue with retention again, indwelling Foley catheter was anchored second time on 10/01/2021.  Continue Flomax.  Will need to be discharged with Foley catheter with follow-up with urology in 1 week for voiding trial.  Right great toe gout: Significantly improved, almost resolved.  Continue colchicine for couple more days.  Anxiety: Continue Xanax, Cymbalta and gabapentin  Deconditioning: PT OT recommended SNF.  TOC on board.  Family prefers [facility].  They are not able to accept her until Monday.  Per TOC note, no other facility is able to accept her until Monday.  Hypokalemia: We will replace.  Nutrition Problem: Inadequate oral intake Etiology: inability to eat    Signs/Symptoms: NPO status    Interventions: Tube feeding  Estimated body mass index is 39.69 kg/m as calculated from the following:   Height as of this encounter: 4\' 9"  (1.448 m).   Weight as of this encounter: 83.2 kg.   DVT prophylaxis: Heparin Code Status: Full Family Communication: None at bedside. Disposition Plan:  Status is: Inpatient  Remains inpatient appropriate because:IV treatments appropriate due to intensity of illness or inability to take PO  Dispo: The patient is from: Home              Anticipated  d/c is to: SNF               Patient currently is medically stable to d/c.   Difficult to place patient No  Consultants:  CM admitted patient. Vascular  surgery ID  Procedures:  ECHO;   Antimicrobials:    Subjective: Seen and examined.  She complains of low back pain, likely due to improper positioning in the bed.  No more toe pain.  Objective: Vitals:   10/02/21 0500 10/02/21 0807 10/02/21 0834 10/02/21 0905  BP:   (!) 150/52   Pulse:   74   Resp:  (!) 23 17   Temp:   98 F (36.7 C)   TempSrc:   Oral   SpO2:   97% 95%  Weight: 83.2 kg     Height:        Intake/Output Summary (Last 24 hours) at 10/02/2021 1124 Last data filed at 10/02/2021 1000 Gross per 24 hour  Intake 480 ml  Output 1800 ml  Net -1320 ml    Filed Weights   09/29/21 0436 10/01/21 0453 10/02/21 0500  Weight: 88.1 kg 83.5 kg 83.2 kg    Examination:  General exam: Appears calm and comfortable  Respiratory system: Clear to auscultation. Respiratory effort normal. Cardiovascular system: S1 & S2 heard, RRR. No JVD, murmurs, rubs, gallops or clicks. No pedal edema. Gastrointestinal system: Abdomen is nondistended, soft and nontender. No organomegaly or masses felt. Normal bowel sounds heard. Central nervous system: Alert and oriented. No focal neurological deficits. Extremities: Symmetric 5 x 5 power. Skin: No rashes, lesions or ulcers.  Psychiatry: Judgement and insight appear normal. Mood & affect appropriate.    Data Reviewed: I have personally reviewed following labs and imaging studies  CBC: Recent Labs  Lab 09/26/21 1043 09/28/21 0831 09/29/21 0121 09/30/21 0148 10/02/21 0202  WBC 26.3* 20.7* 20.8* 17.1* 11.6*  NEUTROABS  --   --   --  14.4* 7.3  HGB 9.8* 10.9* 10.1* 9.4* 8.9*  HCT 28.7* 33.3* 31.0* 27.3* 26.6*  MCV 84.7 87.2 87.3 85.6 85.8  PLT 226 343 368 306 519*    Basic Metabolic Panel: Recent Labs  Lab 09/26/21 0220 09/26/21 1043 09/28/21 0831 09/29/21 0121 09/30/21 0148 10/02/21 0202  NA 137 137 134* 134* 132* 132*  K 3.7 4.0 3.8 3.9 4.2 3.7  CL 101 101 97* 97* 96* 98  CO2 25 25 29 27 27 27   GLUCOSE 113* 165*  112* 113* 113* 103*  BUN 28* 31* 11 12 8 8   CREATININE 0.98 1.01* 0.75 0.67 0.69 0.65  CALCIUM 8.2* 8.3* 8.8* 8.9 8.7* 8.5*  MG 1.4*  --  1.9  --   --  1.4*    GFR: Estimated Creatinine Clearance: 44.9 mL/min (by C-G formula based on SCr of 0.65 mg/dL). Liver Function Tests: Recent Labs  Lab 09/26/21 0220 09/30/21 0148  AST 33 21  ALT 114* 30  ALKPHOS 191* 114  BILITOT 0.8 0.8  PROT 5.1* 5.6*  ALBUMIN 2.2* 2.4*    No results for input(s): LIPASE, AMYLASE in the last 168 hours.  No results for input(s): AMMONIA in the last 168 hours. Coagulation Profile: No results for input(s): INR, PROTIME in the last 168 hours.  Cardiac Enzymes: No results for input(s): CKTOTAL, CKMB, CKMBINDEX, TROPONINI in the last 168 hours. BNP (last 3 results) No results for input(s): PROBNP in the last 8760 hours. HbA1C: No results for input(s): HGBA1C in the last 72 hours. CBG: Recent Labs  Lab  09/30/21 0358 09/30/21 0810 09/30/21 1258 09/30/21 1535 09/30/21 2101  GLUCAP 114* 103* 109* 118* 105*    Lipid Profile: No results for input(s): CHOL, HDL, LDLCALC, TRIG, CHOLHDL, LDLDIRECT in the last 72 hours. Thyroid Function Tests: No results for input(s): TSH, T4TOTAL, FREET4, T3FREE, THYROIDAB in the last 72 hours. Anemia Panel: No results for input(s): VITAMINB12, FOLATE, FERRITIN, TIBC, IRON, RETICCTPCT in the last 72 hours. Sepsis Labs: No results for input(s): PROCALCITON, LATICACIDVEN in the last 168 hours.   Recent Results (from the past 240 hour(s))  MRSA Next Gen by PCR, Nasal     Status: None   Collection Time: 09/22/21 11:46 AM   Specimen: Nasal Mucosa; Nasal Swab  Result Value Ref Range Status   MRSA by PCR Next Gen NOT DETECTED NOT DETECTED Final    Comment: (NOTE) The GeneXpert MRSA Assay (FDA approved for NASAL specimens only), is one component of a comprehensive MRSA colonization surveillance program. It is not intended to diagnose MRSA infection nor to guide or  monitor treatment for MRSA infections. Test performance is not FDA approved in patients less than 51 years old. Performed at [facility], [place] [address] [place], [place] [address]   Culture, blood (routine x 2)     Status: None   Collection Time: 09/24/21 10:00 AM   Specimen: BLOOD RIGHT HAND  Result Value Ref Range Status   Specimen Description BLOOD RIGHT HAND  Final   Special Requests   Final    BOTTLES DRAWN AEROBIC ONLY Blood Culture adequate volume   Culture   Final    NO GROWTH 5 DAYS Performed at [facility], [place] [address], [place], [place] [address]    Report Status 09/29/2021 FINAL  Final  Culture, blood (routine x 2)     Status: None   Collection Time: 09/24/21 10:08 AM   Specimen: BLOOD RIGHT HAND  Result Value Ref Range Status   Specimen Description BLOOD RIGHT HAND  Final   Special Requests   Final    BOTTLES DRAWN AEROBIC ONLY Blood Culture adequate volume   Culture   Final    NO GROWTH 5 DAYS Performed at [facility], [place] [address] [place], [place] [address]    Report Status 09/29/2021 FINAL  Final          Radiology Studies: No results found.      Scheduled Meds:  ALPRAZolam  0.25 mg Oral TID   arformoterol  15 mcg Nebulization BID   aspirin  81 mg Oral Daily   atorvastatin  20 mg Oral Daily   carvedilol  12.5 mg Oral BID WC   cefadroxil  500 mg Oral BID   chlorhexidine  15 mL Mouth Rinse BID   Chlorhexidine Gluconate Cloth  6 each Topical Q0600   colchicine  0.6 mg Oral Daily   diclofenac Sodium  2 g Topical TID AC & HS   DULoxetine  20 mg Oral Daily   enoxaparin (LOVENOX) injection  40 mg Subcutaneous Q24H   feeding supplement  237 mL Oral BID BM   gabapentin  100 mg Oral QHS   guaiFENesin  600 mg Oral BID   mouth rinse  15 mL Mouth Rinse q12n4p   pantoprazole  40 mg Oral Daily   revefenacin  175 mcg Nebulization Daily   tamsulosin  0.4 mg Oral QPC supper   Continuous Infusions:  sodium chloride Stopped  (09/26/21 1408)     LOS: 10 days  Time spent: 27 minutes.   Darliss Cheney, MD [facility]   If 7PM-7AM, please contact night-coverage [email or web address]  10/02/2021, 11:24 AM

## 2021-10-03 DIAGNOSIS — N179 Acute kidney failure, unspecified: Secondary | ICD-10-CM | POA: Diagnosis not present

## 2021-10-03 LAB — MAGNESIUM: Magnesium: 1.7 mg/dL (ref 1.7–2.4)

## 2021-10-03 LAB — SARS CORONAVIRUS 2 (TAT 6-24 HRS): SARS Coronavirus 2: NEGATIVE

## 2021-10-03 NOTE — Progress Notes (Signed)
   10/03/21 1229  What Happened  Was fall witnessed? No  Was patient injured? No  Patient found on floor  Found by No one-pt stated they fell  Stated prior activity to/from bed, chair, or stretcher (pt was sitting in chair)  Follow Up  MD notified Yes  Time MD notified 1232  Family notified Yes - comment (son-in-law)  Time family notified 1333  Additional tests No  Simple treatment Other (comment) (pt denies pain)  Progress note created (see row info) Yes  Adult Fall Risk Assessment  Risk Factor Category (scoring not indicated) High fall risk per protocol (document High fall risk);Fall has occurred during this admission (document High fall risk)  Patient Fall Risk Level High fall risk  Adult Fall Risk Interventions  Required Bundle Interventions *See Row Information* High fall risk - low, moderate, and high requirements implemented  Additional Interventions Use of appropriate toileting equipment (bedpan, BSC, etc.)  Screening for Fall Injury Risk (To be completed on HIGH fall risk patients) - Assessing Need for Floor Mats  Risk For Fall Injury- Criteria for Floor Mats 85 years or older  Will Implement Floor Mats Yes  Vitals  Temp 97.8 F (36.6 C)  Temp Source Oral  BP 117/67  MAP (mmHg) 83  BP Location Left Arm  BP Method Automatic  Patient Position (if appropriate) Lying  Pulse Rate 77  Pulse Rate Source Monitor  Resp 16  Oxygen Therapy  SpO2 96 %  O2 Device Room Air  Pulse Oximetry Type Continuous  Pain Assessment  Pain Scale 0-10  Pain Score 0  PCA/Epidural/Spinal Assessment  Respiratory Pattern Regular;Dyspnea with exertion  Neurological  Neuro (WDL) WDL  Level of Consciousness Alert  Glasgow Coma Scale  Eye Opening 4  Best Verbal Response (NON-intubated) 5  Best Motor Response 6  Glasgow Coma Scale Score 15  Musculoskeletal  Musculoskeletal (WDL) X  Assistive Device Four wheel walker  Generalized Weakness Yes  Integumentary  Integumentary (WDL) X   Skin Color Appropriate for ethnicity  Skin Condition Dry  Skin Integrity Ecchymosis  Ecchymosis Location Abdomen;Perineum;Toe (Comment  which one)  Pain Assessment  Result of Injury No  Pain Assessment  Work-Related Injury No  Pain Screening  Response to Interventions no c/o pain  Effect of Pain on Daily Activities n/a  Clinical Progression Not changed   Pt was sitting in chair pt stated she stated she was "changing positions and the foot rest collapsed" and she fell to the floor. She says she "bumped her head" but it "doesn't hurt anymore", no obvious sign of injury. Pt assisted to the bed. VSS. Pahwani MD messaged via secure chat. Pt stated she wasn't having any more pain once she got back into the bed.   Q4H neuro checks ordered x3

## 2021-10-03 NOTE — Progress Notes (Signed)
PROGRESS NOTE    Deborah Jordan  HQP:[id number] DOB: 09-19-1933 DOA: 09/22/2021 PCP: [name], MD   Brief Narrative: 87 year old with past medical history significant for hyponatremia, hypertension, bilateral chronic knee pain, who presents to [name] ED on 10/5 with abdominal pain for 3 to 4 days.  EMS was called and patient was found to be hypotensive blood pressure in the 80s.  Patient received 2 L of IV fluids and she was a started on Levophed.  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 Patient was admitted under CCM for septic shock from E. coli and strep bacteremia.  Assessment & Plan:   Active Problems:   Septic shock (HCC)   AKI (acute kidney injury) ([place])   Hypokalemia   Transaminitis   Bacteremia  1-Septic shock secondary to E. coli and strep infantarius bacteremia: Patient presented with hypotension, leukocytosis, hypothermia.  Required pressors which were weaned off on 09/23/2021. Blood cultures 10/5; E. coli and Streptococcus Infantarius.  Repeated blood cultures 10/7: No growth to date. Bacteremia thought to be secondary to bowel translocation. ID consulted recommended total 14 days of antibiotics with end date of 10/08/2021.  Patient was on IV cefazolin, transitioned to cefadroxil on 09/29/2021 per ID recommendations.  She is afebrile with leukocytosis improving. Echo negative for vegetation.  2-Acute hypoxic respiratory failure secondary to critical illness, pulmonary edema: Patient required mechanical ventilation on 10/5 and extubated on 09/26/2021. She received IV diuresis. Patient currently on room air and saturating 95%.  Encouraged incentive spirometry, continue Brovana, and flutter valve.  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  Occlusion is not of clinical significance.  They recommended continue aspirin and adding statin but no a statin ordered.  We will order atorvastatin 20 mg.  AKI: Resolved  Hypokalemia, hypomagnesemia: Resolved  Transaminases: Presume shock liver, resolved and back to normal.  History of CAD, hypertension, hyperlipidemia, diastolic heart failure, right bundle branch block: Continue aspirin.  Acute urinary retention: Issue with retention again, indwelling Foley catheter was anchored second time on 10/01/2021.  Continue Flomax.  Will need to be discharged with Foley catheter with follow-up with urology in 1 week for voiding trial.  Right great toe gout: Significantly improved, almost resolved.  Continue colchicine until discharge.  Anxiety: Continue Xanax, Cymbalta and gabapentin  Deconditioning: PT OT recommended SNF.  TOC on board.  [facility].  They are not able to accept her until Monday.  Per TOC note, no other facility is able to accept her until Monday.  Hypokalemia: We will replace.  Unwitnessed fall: Reportedly, patient was changing her position in the chair when her foot wrist collapsed and she fell to the floor.  Per patient, she bumped her head but she was not complaining of any pain and there was no visible injury or bruise.  Patient neurologically intact.  We will check neuro check every 4 hours for rest of the day today.  If any signs of change in mental status, will keep low threshold for CT head but currently no indication to do that.  Nutrition Problem: Inadequate oral intake Etiology: inability to eat  Signs/Symptoms: NPO status  Interventions: Tube feeding  Estimated body mass index is 39.12 kg/m as calculated  from the following:   Height as of this encounter: 4\' 9"  (1.448 m).   Weight as of this encounter: 82 kg.   DVT prophylaxis: Heparin Code  Status: Full Family Communication: None at bedside. Disposition Plan:  Status is: Inpatient  Remains inpatient appropriate because:IV treatments appropriate due to intensity of illness or inability to take PO  Dispo: The patient is from: Home              Anticipated d/c is to: SNF               Patient currently is medically stable to d/c.   Difficult to place patient No  Consultants:  CM admitted patient. Vascular surgery ID  Procedures:  ECHO;   Antimicrobials:    Subjective: Patient seen and examined.  She had no complaint this morning other than her chronic low back pain.  Objective: Vitals:   10/03/21 0630 10/03/21 0805 10/03/21 0815 10/03/21 1229  BP:   (!) 146/57 117/67  Pulse: 74  73 77  Resp: 16 (!) 25 (!) 22 16  Temp:   98.1 F (36.7 C) 97.8 F (36.6 C)  TempSrc:   Oral Oral  SpO2: 91% 92% 92% 96%  Weight: 82 kg     Height:        Intake/Output Summary (Last 24 hours) at 10/03/2021 1406 Last data filed at 10/03/2021 0530 Gross per 24 hour  Intake 600 ml  Output 1525 ml  Net -925 ml    Filed Weights   10/02/21 0500 10/03/21 0546 10/03/21 0630  Weight: 83.2 kg 82.2 kg 82 kg    Examination:  General exam: Appears calm and comfortable  Respiratory system: Clear to auscultation. Respiratory effort normal. Cardiovascular system: S1 & S2 heard, RRR. No JVD, murmurs, rubs, gallops or clicks. No pedal edema. Gastrointestinal system: Abdomen is nondistended, soft and nontender. No organomegaly or masses felt. Normal bowel sounds heard. Central nervous system: Alert and oriented. No focal neurological deficits. Extremities: Symmetric 5 x 5 power. Skin: No rashes, lesions or ulcers.  Psychiatry: Judgement and insight appear normal. Mood & affect appropriate.    Data Reviewed: I have personally reviewed following labs and imaging studies  CBC: Recent Labs  Lab 09/28/21 0831 09/29/21 0121 09/30/21 0148 10/02/21 0202  WBC 20.7* 20.8* 17.1* 11.6*   NEUTROABS  --   --  14.4* 7.3  HGB 10.9* 10.1* 9.4* 8.9*  HCT 33.3* 31.0* 27.3* 26.6*  MCV 87.2 87.3 85.6 85.8  PLT 343 368 306 519*    Basic Metabolic Panel: Recent Labs  Lab 09/28/21 0831 09/29/21 0121 09/30/21 0148 10/02/21 0202 10/03/21 0137  NA 134* 134* 132* 132*  --   K 3.8 3.9 4.2 3.7  --   CL 97* 97* 96* 98  --   CO2 29 27 27 27   --   GLUCOSE 112* 113* 113* 103*  --   BUN 11 12 8 8   --   CREATININE 0.75 0.67 0.69 0.65  --   CALCIUM 8.8* 8.9 8.7* 8.5*  --   MG 1.9  --   --  1.4* 1.7    GFR: Estimated Creatinine Clearance: 44.6 mL/min (by C-G formula based on SCr of 0.65 mg/dL). Liver Function Tests: Recent Labs  Lab 09/30/21 0148  AST 21  ALT 30  ALKPHOS 114  BILITOT 0.8  PROT 5.6*  ALBUMIN 2.4*    No results for input(s): LIPASE, AMYLASE in the last 168 hours.  No results for input(s): AMMONIA  in the last 168 hours. Coagulation Profile: No results for input(s): INR, PROTIME in the last 168 hours.  Cardiac Enzymes: No results for input(s): CKTOTAL, CKMB, CKMBINDEX, TROPONINI in the last 168 hours. BNP (last 3 results) No results for input(s): PROBNP in the last 8760 hours. HbA1C: No results for input(s): HGBA1C in the last 72 hours. CBG: Recent Labs  Lab 09/30/21 0358 09/30/21 0810 09/30/21 1258 09/30/21 1535 09/30/21 2101  GLUCAP 114* 103* 109* 118* 105*    Lipid Profile: No results for input(s): CHOL, HDL, LDLCALC, TRIG, CHOLHDL, LDLDIRECT in the last 72 hours. Thyroid Function Tests: No results for input(s): TSH, T4TOTAL, FREET4, T3FREE, THYROIDAB in the last 72 hours. Anemia Panel: No results for input(s): VITAMINB12, FOLATE, FERRITIN, TIBC, IRON, RETICCTPCT in the last 72 hours. Sepsis Labs: No results for input(s): PROCALCITON, LATICACIDVEN in the last 168 hours.   Recent Results (from the past 240 hour(s))  Culture, blood (routine x 2)     Status: None   Collection Time: 09/24/21 10:00 AM   Specimen: BLOOD RIGHT HAND   Result Value Ref Range Status   Specimen Description BLOOD RIGHT HAND  Final   Special Requests   Final    BOTTLES DRAWN AEROBIC ONLY Blood Culture adequate volume   Culture   Final    NO GROWTH 5 DAYS Performed at [facility], [address], [place], [place] [address]    Report Status 09/29/2021 FINAL  Final  Culture, blood (routine x 2)     Status: None   Collection Time: 09/24/21 10:08 AM   Specimen: BLOOD RIGHT HAND  Result Value Ref Range Status   Specimen Description BLOOD RIGHT HAND  Final   Special Requests   Final    BOTTLES DRAWN AEROBIC ONLY Blood Culture adequate volume   Culture   Final    NO GROWTH 5 DAYS Performed at [facility], [place] [address], [place], [place] [address]    Report Status 09/29/2021 FINAL  Final  SARS CORONAVIRUS 2 (TAT 6-24 HRS) Nasopharyngeal Nasopharyngeal Swab     Status: None   Collection Time: 10/03/21  3:19 AM   Specimen: Nasopharyngeal Swab  Result Value Ref Range Status   SARS Coronavirus 2 NEGATIVE NEGATIVE Final    Comment: (NOTE) SARS-CoV-2 target nucleic acids are NOT DETECTED.  The SARS-CoV-2 RNA is generally detectable in upper and lower respiratory specimens during the acute phase of infection. Negative results do not preclude SARS-CoV-2 infection, do not rule out co-infections with other pathogens, and should not be used as the sole basis for treatment or other patient management decisions. Negative results must be combined with clinical observations, patient history, and epidemiological information. The expected result is Negative.  Fact Sheet for Patients: SugarRoll.be  Fact Sheet for Healthcare Providers: https://www.woods-mathews.com/  This test is not yet approved or cleared by the [place] FDA and  has been authorized for detection and/or diagnosis of SARS-CoV-2 by FDA under an Emergency Use Authorization (EUA). This EUA will remain  in effect (meaning  this test can be used) for the duration of the COVID-19 declaration under Se ction 564(b)(1) of the Act, 21 U.S.C. section 360bbb-3(b)(1), unless the authorization is terminated or revoked sooner.  Performed at [facility], [place] [address], [place], [place] [address]           Radiology Studies: No results found.      Scheduled Meds:  ALPRAZolam  0.25 mg Oral TID   arformoterol  15 mcg Nebulization BID   aspirin  81 mg Oral Daily   atorvastatin  20 mg Oral Daily   carvedilol  12.5 mg Oral BID WC   cefadroxil  500 mg Oral BID   chlorhexidine  15 mL Mouth Rinse BID   Chlorhexidine Gluconate Cloth  6 each Topical Q0600   colchicine  0.6 mg Oral Daily   diclofenac Sodium  2 g Topical TID AC & HS   DULoxetine  20 mg Oral Daily   enoxaparin (LOVENOX) injection  40 mg Subcutaneous Q24H   feeding supplement  237 mL Oral BID BM   gabapentin  100 mg Oral QHS   guaiFENesin  600 mg Oral BID   mouth rinse  15 mL Mouth Rinse q12n4p   pantoprazole  40 mg Oral Daily   revefenacin  175 mcg Nebulization Daily   tamsulosin  0.4 mg Oral QPC supper   Continuous Infusions:  sodium chloride Stopped (09/26/21 1408)     LOS: 11 days    Time spent: 30 minutes.   Darliss Cheney, MD [facility]   If 7PM-7AM, please contact night-coverage [email or web address]  10/03/2021, 2:06 PM

## 2021-10-03 NOTE — TOC Progression Note (Addendum)
Transition of Care [facility]) - Progression Note    Patient Details  Name: Deborah Jordan MRN: [id number] Date of Birth: [birth year]  Transition of Care [facility]) CM/SW [place], [place] Phone Number: 10/03/2021, 1:52 PM  Clinical Narrative:     CSW will check in with [facility] in the am to see if they  can accept patient if medically ready. Insurance authorization has been approved. CSW will continue to follow and assist with patients dc planning needs.  Expected Discharge Plan: [place] Barriers to Discharge: No Barriers Identified  Expected Discharge Plan and Services Expected Discharge Plan: [place] In-house Referral: Clinical Social Work   Post Acute Care Choice: [place] Living arrangements for the past 2 months: Single Family Home                                       Social Determinants of Health (SDOH) Interventions    Readmission Risk Interventions No flowsheet data found.

## 2021-10-03 NOTE — Plan of Care (Signed)
  Problem: Education: Goal: Knowledge of General Education information will improve Description Including pain rating scale, medication(s)/side effects and non-pharmacologic comfort measures Outcome: Progressing   Problem: Health Behavior/Discharge Planning: Goal: Ability to manage health-related needs will improve Outcome: Progressing   

## 2021-10-03 NOTE — Progress Notes (Signed)
Pt refuses BIPAP QHS.  

## 2021-10-04 DIAGNOSIS — R7881 Bacteremia: Secondary | ICD-10-CM | POA: Diagnosis not present

## 2021-10-04 DIAGNOSIS — Z743 Need for continuous supervision: Secondary | ICD-10-CM | POA: Diagnosis not present

## 2021-10-04 DIAGNOSIS — K219 Gastro-esophageal reflux disease without esophagitis: Secondary | ICD-10-CM | POA: Diagnosis not present

## 2021-10-04 DIAGNOSIS — N390 Urinary tract infection, site not specified: Secondary | ICD-10-CM | POA: Diagnosis not present

## 2021-10-04 DIAGNOSIS — K5900 Constipation, unspecified: Secondary | ICD-10-CM | POA: Diagnosis not present

## 2021-10-04 DIAGNOSIS — Z8619 Personal history of other infectious and parasitic diseases: Secondary | ICD-10-CM | POA: Diagnosis not present

## 2021-10-04 DIAGNOSIS — Z8709 Personal history of other diseases of the respiratory system: Secondary | ICD-10-CM | POA: Diagnosis not present

## 2021-10-04 DIAGNOSIS — I748 Embolism and thrombosis of other arteries: Secondary | ICD-10-CM | POA: Diagnosis not present

## 2021-10-04 DIAGNOSIS — I10 Essential (primary) hypertension: Secondary | ICD-10-CM | POA: Diagnosis not present

## 2021-10-04 DIAGNOSIS — R7401 Elevation of levels of liver transaminase levels: Secondary | ICD-10-CM | POA: Diagnosis not present

## 2021-10-04 DIAGNOSIS — R6521 Severe sepsis with septic shock: Secondary | ICD-10-CM | POA: Diagnosis not present

## 2021-10-04 DIAGNOSIS — R54 Age-related physical debility: Secondary | ICD-10-CM | POA: Diagnosis not present

## 2021-10-04 DIAGNOSIS — E785 Hyperlipidemia, unspecified: Secondary | ICD-10-CM | POA: Diagnosis not present

## 2021-10-04 DIAGNOSIS — E876 Hypokalemia: Secondary | ICD-10-CM | POA: Diagnosis not present

## 2021-10-04 DIAGNOSIS — M109 Gout, unspecified: Secondary | ICD-10-CM | POA: Diagnosis not present

## 2021-10-04 DIAGNOSIS — R531 Weakness: Secondary | ICD-10-CM | POA: Diagnosis not present

## 2021-10-04 DIAGNOSIS — R6889 Other general symptoms and signs: Secondary | ICD-10-CM | POA: Diagnosis not present

## 2021-10-04 DIAGNOSIS — I5030 Unspecified diastolic (congestive) heart failure: Secondary | ICD-10-CM | POA: Diagnosis not present

## 2021-10-04 DIAGNOSIS — N179 Acute kidney failure, unspecified: Secondary | ICD-10-CM | POA: Diagnosis not present

## 2021-10-04 DIAGNOSIS — W19XXXD Unspecified fall, subsequent encounter: Secondary | ICD-10-CM | POA: Diagnosis not present

## 2021-10-04 DIAGNOSIS — E559 Vitamin D deficiency, unspecified: Secondary | ICD-10-CM | POA: Diagnosis not present

## 2021-10-04 DIAGNOSIS — E871 Hypo-osmolality and hyponatremia: Secondary | ICD-10-CM | POA: Diagnosis not present

## 2021-10-04 DIAGNOSIS — J9690 Respiratory failure, unspecified, unspecified whether with hypoxia or hypercapnia: Secondary | ICD-10-CM | POA: Diagnosis not present

## 2021-10-04 DIAGNOSIS — A419 Sepsis, unspecified organism: Secondary | ICD-10-CM | POA: Diagnosis not present

## 2021-10-04 DIAGNOSIS — R339 Retention of urine, unspecified: Secondary | ICD-10-CM | POA: Diagnosis not present

## 2021-10-04 MED ORDER — ATORVASTATIN CALCIUM 20 MG PO TABS: 20.0000 mg | ORAL_TABLET | Freq: Every day | ORAL | 0 refills | Status: DC

## 2021-10-04 MED ORDER — TAMSULOSIN HCL 0.4 MG PO CAPS: 0.4000 mg | ORAL_CAPSULE | Freq: Every day | ORAL | 0 refills | Status: DC

## 2021-10-04 MED ORDER — ALPRAZOLAM 0.25 MG PO TABS: 0.2500 mg | ORAL_TABLET | Freq: Three times a day (TID) | ORAL | 0 refills | Status: DC | PRN

## 2021-10-04 MED ORDER — CEFADROXIL 500 MG PO CAPS: 500.0000 mg | ORAL_CAPSULE | Freq: Two times a day (BID) | ORAL | 0 refills | Status: AC

## 2021-10-04 NOTE — Progress Notes (Signed)
Discharge instructions discussed with pt and daughter. All belongings sent with daughter. Pt left via EMS

## 2021-10-04 NOTE — TOC Transition Note (Signed)
Transition of Care [facility]) - CM/SW Discharge Note   Patient Details  Name: Deborah Jordan MRN: [id number] Date of Birth: 10-04-1933  Transition of Care [facility]) CM/SW Contact:  Benard Halsted, LCSW Phone Number: 10/04/2021, 10:48 AM   Clinical Narrative:    Patient will DC to: [facility] Anticipated DC date: 10/04/21 Family notified: Daughter, Leisure centre manager by: Corey Harold    Per MD patient ready for DC to [facility]. RN to call report prior to discharge [phone] Room 3222). RN, patient, patient's family, and facility notified of DC. Discharge Summary and FL2 sent to facility. DC packet on chart. Ambulance transport requested for patient.   CSW will sign off for now as social work intervention is no longer needed. Please consult [place] again if new needs arise.     Final next level of care: Skilled Nursing Facility Barriers to Discharge: No Barriers Identified   Patient Goals and CMS Choice   CMS Medicare.gov Compare Post Acute Care list provided to:: Other (Comment Required) Meredith Mody Suzie Portela (Daughter) [phone]) Choice offered to / list presented to : Patient, Adult Children  Discharge Placement   Existing PASRR number confirmed : 10/04/21          Patient [facility] bed at: [facility] Patient to be transferred to facility by: [place] Name of family member notified: Daughter Patient and family notified of of transfer: 10/04/21  Discharge Plan and Services In-house Referral: Clinical Social Work   Post Acute Care Choice: [place]                               Social Determinants of Health (SDOH) Interventions     Readmission Risk Interventions No flowsheet data found.

## 2021-10-04 NOTE — Progress Notes (Signed)
Report called to [facility], Nelida Gores, all questions answered.

## 2021-10-04 NOTE — TOC Progression Note (Signed)
Transition of Care [facility]) - Progression Note    Patient Details  Name: Deborah Jordan MRN: [id number] Date of Birth: Jan 20, 1933  Transition of Care [facility]) CM/SW [place], LCSW Phone Number: 10/04/2021, 9:42 AM  Clinical Narrative:    [facility] ready to accept patient today. CSW spoke with patient's daughter and she will complete paperwork there at 10:30am. CSW will then schedule non-emergency transport.    Expected Discharge Plan: [place] Barriers to Discharge: No Barriers Identified  Expected Discharge Plan and Services Expected Discharge Plan: [place] In-house Referral: Clinical Social Work   Post Acute Care Choice: [place] Living arrangements for the past 2 months: Single Family Home Expected Discharge Date: 10/04/21                                     Social Determinants of Health (SDOH) Interventions    Readmission Risk Interventions No flowsheet data found.

## 2021-10-04 NOTE — Discharge Summary (Signed)
Physician Discharge Summary  Deborah Jordan TTS:[id number] DOB: 09-Apr-1933 DOA: 09/22/2021  PCP: [name], MD  Admit date: 09/22/2021 Discharge date: 10/04/2021 30 Day Unplanned Readmission Risk Score    Flowsheet Row ED to Hosp-Admission (Current) from 09/22/2021 in [facility]  30 Day Unplanned Readmission Risk Score (%) 24.84 Filed at 10/04/2021 0400       This score is the patient's risk of an unplanned readmission within 30 days of being discharged (0 -100%). The score is based on dignosis, age, lab data, medications, orders, and past utilization.   Low:  0-14.9   Medium: 15-21.9   High: 22-29.9   Extreme: 30 and above          Admitted From: Home Disposition: SNF  Recommendations for Outpatient Follow-up:  Follow up with PCP in 1-2 weeks Please obtain BMP/CBC in one week Follow-up with urology within 1 week for voiding trial Please follow up with your PCP on the following pending results: Unresulted Labs (From admission, onward)     Start     Ordered   09/22/21 1000  Miscellaneous Genetic Test (non-interfaced send-out)  Once,   STAT       Comments: [employer]   09/22/21 [place]: None Equipment/Devices: None  Discharge Condition: Stable CODE STATUS: Full code Diet recommendation: Cardiac  Subjective: Seen and examined.  No complaints.  Brief/Interim Summary: 87 year old with past medical history significant for hyponatremia, hypertension, bilateral chronic knee pain, who presented to [name] ED on 10/5 with abdominal pain for 3 to 4 days.  EMS was called and patient was found to be hypotensive blood pressure in the 80s.  Patient received 2 L of IV fluids and she was a started on Levophed.  CT chest abdomen and pelvis was negative for dissection but showed large amount of atherosclerotic plaque with supra total occlusion involving origin and proximal 1.3 cm of celiac artery with collateral supply from  SMA. Patient was admitted under CCM for septic shock from E. coli and strep bacteremia and subsequently was transitioned to floor under TRH/hospitalist service.Marland Kitchen  1-Septic shock secondary to E. coli and strep infantarius bacteremia: Required pressors which were weaned off on 09/23/2021. Blood cultures 10/5; E. coli and Streptococcus Infantarius.  Repeated blood cultures 10/7: No growth to date. Bacteremia thought to be secondary to bowel translocation. ID recommended total 14 days of antibiotics with end date of 10/08/2021.  Patient was on IV cefazolin, transitioned to cefadroxil on 09/29/2021 per ID recommendations.  She is afebrile with leukocytosis improving. Echo negative for vegetation.  She is being discharged to SNF today.   2-Acute hypoxic respiratory failure secondary to critical illness, pulmonary edema: Patient required mechanical ventilation on 10/5 and extubated on 09/26/2021. She received IV diuresis. Patient currently on room air and saturating 95%.    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  Occlusion is not of clinical significance.  They recommended continue aspirin and adding statin.   AKI: Resolved   Transaminases: Presume shock liver, resolved and back to normal.   History of CAD, hypertension, hyperlipidemia, diastolic heart failure, right bundle branch block: Continue aspirin.   Acute urinary retention: Patient had recurrent issues with retention.  She failed voiding trial x2.  Indwelling Foley catheter was once again placed.  She  was started on Flomax.  She is being discharged with indwelling Foley catheter with instructions to follow-up with urology for voiding trial.   Right great toe gout: Resolved with colchicine.  No more colchicine needed.  She has been pain-free for the last 3 days now.   Anxiety: Continue  Xanax, Cymbalta and gabapentin   Deconditioning: PT OT recommended SNF.  She is being discharged to SNF today.  Hypokalemia: Resolved   Unwitnessed fall: On the afternoon of 10/03/2021, reportedly, patient was changing her position in the chair when her foot wrist collapsed and she fell to the floor.  Per patient, she bumped her head but she was not complaining of any pain and there was no visible injury or bruise.  Patient neurologically intact.  On reassessment again today, patient had no complaints.  She is neurologically intact.  Discharge Diagnoses:  Active Problems:   Septic shock (HCC)   AKI (acute kidney injury) ([place])   Hypokalemia   Transaminitis   Bacteremia    Discharge Instructions   Allergies as of 10/04/2021       Reactions   Ace Inhibitors Cough   Hydrochlorothiazide    Low Sodium        Medication List     STOP taking these medications    traMADol-acetaminophen 37.5-325 MG tablet Commonly known as: ULTRACET       TAKE these medications    acetaminophen 650 MG CR tablet Commonly known as: TYLENOL Take 650 mg by mouth every 8 (eight) hours as needed for pain.   ALPRAZolam 0.25 MG tablet Commonly known as: XANAX Take 1 tablet (0.25 mg total) by mouth 3 (three) times daily as needed for anxiety.   amLODipine 10 MG tablet Commonly known as: NORVASC Take 10 mg by mouth daily.   aspirin EC 81 MG tablet Take 81 mg by mouth daily. Swallow whole.   atorvastatin 20 MG tablet Commonly known as: LIPITOR Take 1 tablet (20 mg total) by mouth daily.   carvedilol 12.5 MG tablet Commonly known as: COREG Take 12.5 mg by mouth 2 (two) times daily with a meal.   cefadroxil 500 MG capsule Commonly known as: DURICEF Take 1 capsule (500 mg total) by mouth 2 (two) times daily for 4 days. Start taking on: October 05, 2021   DULoxetine 20 MG capsule Commonly known as: Cymbalta Take 1 capsule (20 mg total) by mouth daily.   esomeprazole 40 MG  capsule Commonly known as: NEXIUM Take 40 mg by mouth daily.   gabapentin 100 MG capsule Commonly known as: NEURONTIN Take 1 capsule (100 mg total) by mouth at bedtime.   loratadine 10 MG tablet Commonly known as: CLARITIN Take 10 mg by mouth daily.   tamsulosin 0.4 MG Caps capsule Commonly known as: FLOMAX Take 1 capsule (0.4 mg total) by mouth daily after supper.   Vitamin D 50 MCG (2000 UT) Caps Take 2,000 Units by mouth daily.        Follow-up Information     [name], MD Follow up in 1 week(s).   Specialty: Family Medicine Contact information: [place] [id number] [place] Follow up in 1 week(s).   Contact information: [place] [address] [phone]               Allergies  Allergen Reactions   Ace Inhibitors Cough   Hydrochlorothiazide  Low Sodium    Consultations: ID   Procedures/Studies: DG Chest 1 View  Result Date: 09/22/2021 CLINICAL DATA:  Endotracheal tube placement EXAM: CHEST  1 VIEW COMPARISON:  Radiograph 09/22/2021 FINDINGS: Endotracheal tube extends into the proximal RIGHT mainstem bronchus. Recommend retraction by approximately 4 cm to 5 cm. There is new atelectasis versus infiltrate in the RIGHT lower lobe. Mild pulmonary edema interstitial edema pattern. RIGHT central venous line with tip in the distal RIGHT subclavian vein. IMPRESSION: 1. Endotracheal tube is low.  Recommend retraction by 4-5 cm. 2. Central venous line with tip in the distal RIGHT subclavian vein 3. No pneumothorax. 4. Increased RIGHT basilar atelectasis. These results will be called to the ordering clinician or representative by the Radiologist Assistant, and communication documented in the PACS or [employer]. Electronically Signed   By: Suzy Bouchard M.D.   On: 09/22/2021 13:59   DG CHEST PORT 1 VIEW  Addendum Date: 09/24/2021    ADDENDUM REPORT: 09/24/2021 08:51 ADDENDUM: These results will be called to the ordering clinician or representative by the Radiologist Assistant, and communication documented in the PACS or [employer]. Electronically Signed   By: Zetta Bills M.D.   On: 09/24/2021 08:51   Result Date: 09/24/2021 CLINICAL DATA:  Endotracheal to placement. EXAM: PORTABLE CHEST 1 VIEW COMPARISON:  Exam from September 23, 2021. FINDINGS: Endotracheal tube terminates proximally 1.7 cm above the carina. Gastric tube courses through in off field of the radiograph. RIGHT-sided central venous access device or catheter projects over the RIGHT axilla slowly retracted over a series of prior imaging studies now projecting over the edge of the RIGHT upper chest just below the RIGHT second rib, proximally 2.8 cm peripheral to the clavicle, previously projecting underneath the clavicular head. EKG leads project over the chest. Heart size is enlarged as before. No new area of consolidation with basilar airspace disease and obscured LEFT and RIGHT hemidiaphragm with bilateral effusions. On limited assessment there is no acute skeletal process. IMPRESSION: Presumed RIGHT-sided central venous access device slowly retracted over a series of prior imaging studies. May be peripherally placed now or outside of the vessel and should be correlated with catheter function. Endotracheal tube 1.7 cm above the carina. Cardiomegaly with persistent effusions, basilar airspace disease and pulmonary edema. Electronically Signed: By: Zetta Bills M.D. On: 09/24/2021 08:03   DG Chest Port 1 View  Result Date: 09/23/2021 CLINICAL DATA:  Respiratory failure. EXAM: PORTABLE CHEST 1 VIEW COMPARISON:  09/22/2021 FINDINGS: The endotracheal tube is 17 mm above the carina. The NG tube is coursing down the esophagus and into the stomach. The right subclavian line has an unusual course. May be looped and a branch of the right subclavian vein. The tip is in the  subclavian vein near its junction with the right jugular vein. Stable cardiac enlargement and tortuous calcified thoracic aorta. Persistent perihilar predominant interstitial and airspace process, likely pulmonary edema. Small bilateral pleural effusions. IMPRESSION: 1. Support apparatus as above. 2. Persistent cardiac enlargement, pulmonary edema and pleural effusions. Electronically Signed   By: Marijo Sanes M.D.   On: 09/23/2021 09:35   DG CHEST PORT 1 VIEW  Result Date: 09/22/2021 CLINICAL DATA:  Intubation EXAM: PORTABLE CHEST 1 VIEW COMPARISON:  Portable exam 1409 hours compared to 1321 hours FINDINGS: Tip of endotracheal tube is within origin of the RIGHT mainstem bronchus; recommend withdrawal 3 cm. Tip of RIGHT subclavian central venous catheter projects over distal RIGHT jugular vein, stable. Enlargement of cardiac silhouette with pulmonary vascular  congestion. Scattered interstitial infiltrates and small bibasilar effusions. No pneumothorax. IMPRESSION: Recommend withdrawal of endotracheal tube 3 cm to place tip in trachea above carina. Persistent pulmonary edema. Tip of RIGHT subclavian line projects over distal RIGHT jugular vein. Findings called to [place] on [facility] on 09/21/2021 at 1435 hrs. Electronically Signed   By: Lavonia Dana M.D.   On: 09/22/2021 14:37   DG Chest Port 1 View  Result Date: 09/22/2021 CLINICAL DATA:  Pt brought in via EMS with c/c of epigastric abd pain radiating to back. Abd pain started 4 days ago but was worsen last night and constant. Family called EMS due to constant pain and radiating to shoulderPt having difficulty breathing with oxygen sats in the 80s EXAM: PORTABLE CHEST 1 VIEW COMPARISON:  06/19/2021 and older exams. FINDINGS: Cardiac silhouette is mildly enlarged. No mediastinal or hilar masses. Stable chronic prominence of the interstitial markings. Lungs otherwise clear. No convincing pleural effusion and no pneumothorax. Skeletal structures are  demineralized, grossly intact. IMPRESSION: No acute cardiopulmonary disease. Electronically Signed   By: Lajean Manes M.D.   On: 09/22/2021 08:48   DG Foot 2 Views Right  Result Date: 09/29/2021 CLINICAL DATA:  Right great toe pain EXAM: RIGHT FOOT - 2 VIEW COMPARISON:  None. FINDINGS: Normal alignment. No fracture or dislocation. Moderate midfoot degenerative arthritis. Vascular calcifications are seen within the right foot. Mild-to-moderate soft tissue swelling of the right forefoot and dorsum of the foot is present, best appreciated on lateral examination. IMPRESSION: Soft tissue swelling.  No acute fracture or dislocation. Moderate midfoot degenerative arthritis. Peripheral vascular disease. Electronically Signed   By: Fidela Salisbury M.D.   On: 09/29/2021 01:45   EEG adult  Result Date: 09/07/2021 Alric Ran, MD     09/08/2021  6:03 PM History: 51 yof with confusion and speech difficulty EEG classification: Normal awake and drowsy Description of the recording: The background rhythms of this recording consists of a fairly well modulated medium amplitude theta rhythm , there was no clear PDR. As the record progresses, the patient appears to remain in the waking state throughout the recording. Photic stimulation was performed, did not show any abnormalities. Hyperventilation was also performed, did not show any abnormalities. Toward the end of the recording, the patient enters the drowsy state with slight symmetric slowing seen. The patient never enters stage II sleep. No abnormal epileptiform discharges seen during this recording. There was mild diffuse generalized slowing. EKG monitor shows no evidence of cardiac rhythm abnormalities with a heart rate of 66 bpm. Impression: This is an abnormal EEG recording in the waking and drowsy state due to mild generalized slowing. No evidence of interictal epileptiform discharges seen. Diffuse slowing consistent with a mild generalized brain dysfunction such as  encephalopathy. Alric Ran, MD [facility]    CT Angio Chest/Abd/Pel for Dissection W and/or W/WO  Result Date: 09/22/2021 CLINICAL DATA:  Abdominal pain.  Concern for aortic dissection. EXAM: CT ANGIOGRAPHY CHEST, ABDOMEN AND PELVIS TECHNIQUE: Non-contrast CT of the chest was initially obtained. Multidetector CT imaging through the chest, abdomen and pelvis was performed using the standard protocol during bolus administration of intravenous contrast. Multiplanar reconstructed images and MIPs were obtained and reviewed to evaluate the vascular anatomy. CONTRAST:  77mL OMNIPAQUE IOHEXOL 350 MG/ML SOLN COMPARISON:  None. FINDINGS: CTA CHEST FINDINGS Vascular Findings: Review of the precontrast images is negative for the presence of an intramural hematoma. Moderate amount of atherosclerotic plaque within a normal caliber thoracic aorta, not resulting in  hemodynamically significant stenosis. No evidence of thoracic aortic dissection or periaortic stranding on this non gated examination. Borderline cardiomegaly. Coronary artery calcifications. No pericardial effusion though there is a trace amount of fluid seen with the pericardial recess. Although this examination was not tailored for the evaluation the pulmonary arteries, there are no discrete filling defects within the central pulmonary arterial tree to suggest central pulmonary embolism. Small amount of air is seen within the root of the main pulmonary artery, likely the sequela of peripheral IV insertion. ------------------------------------------------------------- Thoracic aortic measurements: SINOTUBULAR JUNCTION: 27 mm as measured in greatest oblique short axis coronal dimension. PROXIMAL ASCENDING THORACIC AORTA: 30 mm as measured in greatest oblique short axis axial dimension at the level of the main pulmonary artery. AORTIC ARCH: 26 mm as measured in greatest oblique short axis sagittal dimension. PROXIMAL DESCENDING THORACIC AORTA:  24 mm as measured in greatest oblique short axis axial dimension at the level of the main pulmonary artery. DISTAL DESCENDING THORACIC AORTA: 24 mm as measured in greatest oblique short axis axial dimension at the level of the diaphragmatic hiatus. Review of the MIP images confirms the above findings. ------------------------------------------------------------- Non-Vascular Findings: Mediastinum/Lymph Nodes: No bulky mediastinal, hilar or axillary lymphadenopathy. Lungs/Pleura: Minimal dependent subpleural ground-glass and consolidative opacities favored to represent atelectasis. No discrete air bronchograms though there are nodular areas of persistent aeration within the consolidative opacities within the bilateral lung bases. No pleural effusion or pneumothorax. The central pulmonary airways appear patent. No discrete pulmonary nodules given respiratory artifact. Musculoskeletal: No acute or aggressive osseous abnormalities. Moderate to severe degenerative change of the bilateral glenohumeral joints, right greater than left, incompletely evaluated. Stigmata of dish throughout the thoracic spine. Moderate DDD within the imaged lower cervical spine. Regional soft tissues appear normal. Normal appearance of the thyroid gland. _________________________________________________________ _________________________________________________________ CTA ABDOMEN AND PELVIS FINDINGS VASCULAR Aorta: There is a moderate to large amount of predominantly calcified atherosclerotic plaque throughout the abdominal aorta, particularly within the superior most aspect of the abdominal aorta at the level of the diaphragmatic hiatus (image 91, series 100), though not resulting in hemodynamically significant stenosis. No evidence of abdominal aortic dissection or periaortic stranding. Celiac: There is a large amount of calcified atherosclerotic plaque involving the origin of the celiac artery with subtotal occlusion involving the proximal 1  point 3 cm of the vessel (axial image 93, series 100; sagittal image 114, series 13). There is collateral supply from the SMA via the GDA and a hypertrophied network of pancreaticoduodenal arteries. SMA: Minimal amount of eccentric predominantly calcified atherosclerotic plaque involving the origin and proximal aspect of the SMA, not resulting in hemodynamically significant stenosis. As above, the SMA provides robust collateral supply to the celiac artery via the GDA and hypertrophied network of pancreaticoduodenal arteries Renals: The right renal artery is duplicated, nearly co-dominant. Solitary left renal artery. There is a large amount of eccentric mixed calcified and noncalcified atherosclerotic plaque involving the origin of the left renal artery resulting in short segment at least 50% luminal narrowing (coronal image 103, series 12). The right renal arteries appear patent without a hemodynamically significant narrowing. No vessel irregularity to suggest FMD. IMA: Diseased at its origin though remains patent, also with collateral supply from the SMA. Inflow: There is a minimal amount of eccentric mixed calcified and noncalcified atherosclerotic plaque involving the bilateral common iliac arteries, not resulting in a hemodynamically significant stenosis. The bilateral internal iliac arteries are diseased though patent of normal caliber. The bilateral external iliac arteries are of normal  caliber and widely patent without a hemodynamically significant narrowing. There is a very minimal amount of atherosclerotic plaque involving the bilateral common and imaged portions of the bilateral deep and superficial femoral arteries, not resulting in a hemodynamically significant stenosis. Veins: The IVC and pelvic venous systems appear patent on this arterial phase examination Review of the MIP images confirms the above findings. _________________________________________________________ NON-VASCULAR Hepatobiliary: Normal  hepatic contour. No discrete hyperenhancing hepatic lesions. Post cholecystectomy. Common bile duct is mildly dilated measuring 1.2 cm in diameter, likely secondary to patient's age and post cholecystectomy state. No intrahepatic biliary dilatation. No ascites. Pancreas: Normal early arterial phase appearance of the pancreas. Spleen: The spleen is somewhat atrophic but otherwise normal appearance. Adrenals/Urinary Tract: There is symmetric enhancement of the bilateral kidneys. No evidence of nephrolithiasis. No urinary obstruction or perinephric stranding. Normal appearance of the bilateral adrenal glands. Normal appearance of the urinary bladder given degree of distention. Stomach/Bowel: Scattered colonic diverticulosis without evidence of superimposed acute diverticulitis. Moderate colonic stool burden without evidence of enteric obstruction. Normal appearance of the terminal ileum and the retrocecal appendix. High density ingested radiopaque debris is seen within the gastric fundus. Small hiatal hernia. No discrete areas of bowel wall thickening. Note is made of a small diverticulum involving the horizontal segment of the duodenum. Lymphatic: No bulky retroperitoneal, porta hepatis, mesenteric, pelvic or inguinal lymphadenopathy. Reproductive: Normal appearance of the pelvic organs for age. No discrete adnexal lesion. No free fluid the pelvic cul-de-sac. Other: Subcutaneous edema about the midline of the low back. Small bilateral mesenteric fat containing inguinal hernias. Musculoskeletal: No acute or aggressive osseous abnormalities. Moderate severe multilevel lumbar spine DDD. Mild presumably degenerative scoliotic curvature of the thoracolumbar spine. Post left total hip replacement without evidence of hardware failure or loosening within the imaged components. Mild-to-moderate degenerative change of the right hip with joint space loss, subchondral sclerosis and osteophytosis. Review of the MIP images confirms  the above findings. IMPRESSION: Chest CTA impression: 1. No acute cardiopulmonary disease. Specifically, no evidence of thoracic aortic aneurysm, dissection or central pulmonary embolism. 2. Coronary artery calcifications. Abdomen and pelvic CTA impression: 1. No acute findings within the abdomen or pelvis. 2. Moderate to large amount of atherosclerotic plaque within a normal caliber abdominal aorta, not resulting in a hemodynamically significant stenosis. Aortic Atherosclerosis (ICD10-I70.0). 3. 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 The IMA is diseased at its origin though remains patent, though also with collateral supply from the SMA. Presently, there is no evidence of acute mesenteric ischemia, specifically, no discrete areas of bowel wall thickening or evidence of pneumatosis or portal venous gas, however constellation of above findings could be seen in the setting of chronic mesenteric ischemia. Clinical correlation is advised. 4. Suspected hemodynamically significant narrowing involving the origin of the left renal artery without associated delayed renal enhancement or asymmetric renal atrophy. 5. Colonic diverticulosis without evidence superimposed acute diverticulitis. 6. Post cholecystectomy. Electronically Signed   By: Sandi Mariscal M.D.   On: 09/22/2021 09:43   ECHOCARDIOGRAM LIMITED  Result Date: 09/25/2021    ECHOCARDIOGRAM LIMITED REPORT   Patient Name:   Deborah Jordan [facility] Date of Exam: 09/24/2021 Medical Rec #:  [id number]     Height:       57.0 in Accession #:    [id number]    Weight:       192.9 lb Date of Birth:  Feb 27, 1933     BSA:  1.771 m Patient Age:    67 years      BP:           12/58 mmHg Patient Gender: F             HR:           73 bpm. Exam Location:  Inpatient Procedure: Limited Echo Indications:    Bacteremia  History:        Patient has prior history of Echocardiogram examinations, most                  recent 05/04/2017. Respiratory failure requiring intubation.  Sonographer:    Merrie Roof RDCS Referring Phys: [id number] [place]  1. Left ventricular ejection fraction, by estimation, is 55 to 60%. The left ventricle has normal function. The left ventricle has no regional wall motion abnormalities.  2. Right ventricular systolic function is normal. The right ventricular size is normal.  3. Left atrial size was mildly dilated.  4. Mild mitral valve regurgitation. There is moderate holosystolic prolapse of the middle scallop of the posterior leaflet of the mitral valve.  5. The aortic valve is calcified. Aortic valve regurgitation is not visualized. Conclusion(s)/Recommendation(s): No evidence of valvular vegetations on this transthoracic echocardiogram. Would recommend a transesophageal echocardiogram to exclude infective endocarditis if clinically indicated. FINDINGS  Left Ventricle: Left ventricular ejection fraction, by estimation, is 55 to 60%. The left ventricle has normal function. The left ventricle has no regional wall motion abnormalities. Right Ventricle: The right ventricular size is normal. Right ventricular systolic function is normal. Left Atrium: Left atrial size was mildly dilated. Right Atrium: Right atrial size was normal in size. Pericardium: There is no evidence of pericardial effusion. Mitral Valve: There is moderate holosystolic prolapse of the middle scallop of the posterior leaflet of the mitral valve. There is mild thickening of the mitral valve leaflet(s). There is mild calcification of the mitral valve leaflet(s). Mild mitral annular calcification. Mild mitral valve regurgitation. Tricuspid Valve: The tricuspid valve is not well visualized. Aortic Valve: The aortic valve is calcified. Aortic valve regurgitation is not visualized. Pulmonic Valve: The pulmonic valve was not well visualized. LEFT VENTRICLE PLAX 2D LVIDd:         4.50 cm Diastology LVIDs:         2.90 cm LV  e' medial:    5.93 cm/s LV PW:         1.10 cm LV E/e' medial:  14.0 LV IVS:        1.00 cm LV e' lateral:   4.88 cm/s                        LV E/e' lateral: 17.0  IVC IVC diam: 2.40 cm LEFT ATRIUM             Index        RIGHT ATRIUM           Index LA diam:        3.70 cm 2.09 cm/m   RA Area:     21.00 cm LA Vol (A2C):   71.1 ml 40.14 ml/m  RA Volume:   55.80 ml  31.50 ml/m LA Vol (A4C):   63.2 ml 35.68 ml/m LA Biplane Vol: 66.6 ml 37.60 ml/m   AORTA Ao Root diam: 2.50 cm Ao Asc diam:  2.80 cm MITRAL VALVE MV Area (PHT): 2.91 cm MV Decel Time: 261 msec MV E velocity: 83.20  cm/s MV A velocity: 125.00 cm/s MV E/A ratio:  0.67 Candee Furbish MD Electronically signed by Candee Furbish MD Signature Date/Time: 09/25/2021/6:33:27 AM    Final    US Abdomen Limited RUQ (LIVER/GB)  Result Date: 09/23/2021 CLINICAL DATA:  Elevated liver enzymes EXAM: ULTRASOUND ABDOMEN LIMITED RIGHT UPPER QUADRANT COMPARISON:  None. FINDINGS: Gallbladder: Prior cholecystectomy. Common bile duct: Diameter: 6-9 mm Liver: Mildly coarsened liver echotexture with mildly nodular left hepatic lobe contours. Portal vein is patent on color Doppler imaging with normal direction of blood flow towards the liver. Other: Trace ascites.  Right pleural effusion. IMPRESSION: Mildly coarsened liver echotexture with mildly nodular left hepatic lobe contours, which can be seen in chronic liver disease. Common bile duct measures 6-9 mm, which can be normal after cholecystectomy. Correlate with bilirubin levels. Trace ascites.  Right pleural effusion. Electronically Signed   By: Maurine Simmering M.D.   On: 09/23/2021 10:00     Discharge Exam: Vitals:   10/04/21 0849 10/04/21 0857  BP: (!) 143/66   Pulse: 79   Resp: 16   Temp: 97.7 F (36.5 C)   SpO2: 96% 96%   Vitals:   10/03/21 2339 10/04/21 0404 10/04/21 0849 10/04/21 0857  BP: (!) 146/67 (!) 143/57 (!) 143/66   Pulse: 79 74 79   Resp: 16 17 16    Temp: (!) 97.5 F (36.4 C) (!) 97.3 F (36.3  C) 97.7 F (36.5 C)   TempSrc: Oral Oral Oral   SpO2: 93% 93% 96% 96%  Weight:      Height:        General: Pt is alert, awake, not in acute distress Cardiovascular: RRR, S1/S2 +, no rubs, no gallops Respiratory: CTA bilaterally, no wheezing, no rhonchi Abdominal: Soft, NT, ND, bowel sounds + Extremities: no edema, no cyanosis    The results of significant diagnostics from this hospitalization (including imaging, microbiology, ancillary and laboratory) are listed below for reference.     Microbiology: Recent Results (from the past 240 hour(s))  Culture, blood (routine x 2)     Status: None   Collection Time: 09/24/21 10:00 AM   Specimen: BLOOD RIGHT HAND  Result Value Ref Range Status   Specimen Description BLOOD RIGHT HAND  Final   Special Requests   Final    BOTTLES DRAWN AEROBIC ONLY Blood Culture adequate volume   Culture   Final    NO GROWTH 5 DAYS Performed at [facility], [address], [place], [place] [address]    Report Status 09/29/2021 FINAL  Final  Culture, blood (routine x 2)     Status: None   Collection Time: 09/24/21 10:08 AM   Specimen: BLOOD RIGHT HAND  Result Value Ref Range Status   Specimen Description BLOOD RIGHT HAND  Final   Special Requests   Final    BOTTLES DRAWN AEROBIC ONLY Blood Culture adequate volume   Culture   Final    NO GROWTH 5 DAYS Performed at [facility], [place] [address], [place], [place] [address]    Report Status 09/29/2021 FINAL  Final  SARS CORONAVIRUS 2 (TAT 6-24 HRS) Nasopharyngeal Nasopharyngeal Swab     Status: None   Collection Time: 10/03/21  3:19 AM   Specimen: Nasopharyngeal Swab  Result Value Ref Range Status   SARS Coronavirus 2 NEGATIVE NEGATIVE Final    Comment: (NOTE) SARS-CoV-2 target nucleic acids are NOT DETECTED.  The SARS-CoV-2 RNA is generally detectable in upper and lower respiratory specimens during the  acute phase of infection. Negative results do not preclude SARS-CoV-2 infection,  do not rule out co-infections with other pathogens, and should not be used as the sole basis for treatment or other patient management decisions. Negative results must be combined with clinical observations, patient history, and epidemiological information. The expected result is Negative.  Fact Sheet for Patients: SugarRoll.be  Fact Sheet for Healthcare Providers: https://www.woods-mathews.com/  This test is not yet approved or cleared by the [place] FDA and  has been authorized for detection and/or diagnosis of SARS-CoV-2 by FDA under an Emergency Use Authorization (EUA). This EUA will remain  in effect (meaning this test can be used) for the duration of the COVID-19 declaration under Se ction 564(b)(1) of the Act, 21 U.S.C. section 360bbb-3(b)(1), unless the authorization is terminated or revoked sooner.  Performed at [facility], [place] [address], [place], [place] [address]      Labs: BNP (last 3 results) Recent Labs    06/19/21 0319  BNP 106.2*   Basic Metabolic Panel: Recent Labs  Lab 09/28/21 0831 09/29/21 0121 09/30/21 0148 10/02/21 0202 10/03/21 0137  NA 134* 134* 132* 132*  --   K 3.8 3.9 4.2 3.7  --   CL 97* 97* 96* 98  --   CO2 29 27 27 27   --   GLUCOSE 112* 113* 113* 103*  --   BUN 11 12 8 8   --   CREATININE 0.75 0.67 0.69 0.65  --   CALCIUM 8.8* 8.9 8.7* 8.5*  --   MG 1.9  --   --  1.4* 1.7   Liver Function Tests: Recent Labs  Lab 09/30/21 0148  AST 21  ALT 30  ALKPHOS 114  BILITOT 0.8  PROT 5.6*  ALBUMIN 2.4*   No results for input(s): LIPASE, AMYLASE in the last 168 hours. No results for input(s): AMMONIA in the last 168 hours. CBC: Recent Labs  Lab 09/28/21 0831 09/29/21 0121 09/30/21 0148 10/02/21 0202  WBC 20.7* 20.8* 17.1* 11.6*  NEUTROABS  --   --  14.4* 7.3  HGB 10.9* 10.1* 9.4* 8.9*  HCT 33.3* 31.0* 27.3* 26.6*  MCV 87.2 87.3 85.6 85.8  PLT 343 368 306 519*   Cardiac  Enzymes: No results for input(s): CKTOTAL, CKMB, CKMBINDEX, TROPONINI in the last 168 hours. BNP: Invalid input(s): POCBNP CBG: Recent Labs  Lab 09/30/21 0358 09/30/21 0810 09/30/21 1258 09/30/21 1535 09/30/21 2101  GLUCAP 114* 103* 109* 118* 105*   D-Dimer No results for input(s): DDIMER in the last 72 hours. Hgb A1c No results for input(s): HGBA1C in the last 72 hours. Lipid Profile No results for input(s): CHOL, HDL, LDLCALC, TRIG, CHOLHDL, LDLDIRECT in the last 72 hours. Thyroid function studies No results for input(s): TSH, T4TOTAL, T3FREE, THYROIDAB in the last 72 hours.  Invalid input(s): FREET3 Anemia work up No results for input(s): VITAMINB12, FOLATE, FERRITIN, TIBC, IRON, RETICCTPCT in the last 72 hours. Urinalysis    Component Value Date/Time   COLORURINE AMBER (A) 09/22/2021 0923   APPEARANCEUR CLEAR 09/22/2021 0923   LABSPEC 1.035 (H) 09/22/2021 0923   PHURINE 6.0 09/22/2021 0923   GLUCOSEU NEGATIVE 09/22/2021 0923   HGBUR NEGATIVE 09/22/2021 0923   BILIRUBINUR NEGATIVE 09/22/2021 0923   KETONESUR NEGATIVE 09/22/2021 0923   PROTEINUR 30 (A) 09/22/2021 0923   UROBILINOGEN 0.2 03/08/2013 0916   NITRITE NEGATIVE 09/22/2021 0923   LEUKOCYTESUR NEGATIVE 09/22/2021 0923   Sepsis Labs Invalid input(s): PROCALCITONIN,  WBC,  LACTICIDVEN Microbiology Recent Results (  from the past 240 hour(s))  Culture, blood (routine x 2)     Status: None   Collection Time: 09/24/21 10:00 AM   Specimen: BLOOD RIGHT HAND  Result Value Ref Range Status   Specimen Description BLOOD RIGHT HAND  Final   Special Requests   Final    BOTTLES DRAWN AEROBIC ONLY Blood Culture adequate volume   Culture   Final    NO GROWTH 5 DAYS Performed at [facility], [address], [place], [place] [address]    Report Status 09/29/2021 FINAL  Final  Culture, blood (routine x 2)     Status: None   Collection Time: 09/24/21 10:08 AM   Specimen: BLOOD RIGHT HAND  Result Value Ref Range  Status   Specimen Description BLOOD RIGHT HAND  Final   Special Requests   Final    BOTTLES DRAWN AEROBIC ONLY Blood Culture adequate volume   Culture   Final    NO GROWTH 5 DAYS Performed at [facility], [place] [address] [place], [place] [address]    Report Status 09/29/2021 FINAL  Final  SARS CORONAVIRUS 2 (TAT 6-24 HRS) Nasopharyngeal Nasopharyngeal Swab     Status: None   Collection Time: 10/03/21  3:19 AM   Specimen: Nasopharyngeal Swab  Result Value Ref Range Status   SARS Coronavirus 2 NEGATIVE NEGATIVE Final    Comment: (NOTE) SARS-CoV-2 target nucleic acids are NOT DETECTED.  The SARS-CoV-2 RNA is generally detectable in upper and lower respiratory specimens during the acute phase of infection. Negative results do not preclude SARS-CoV-2 infection, do not rule out co-infections with other pathogens, and should not be used as the sole basis for treatment or other patient management decisions. Negative results must be combined with clinical observations, patient history, and epidemiological information. The expected result is Negative.  Fact Sheet for Patients: SugarRoll.be  Fact Sheet for Healthcare Providers: https://www.woods-mathews.com/  This test is not yet approved or cleared by the [place] FDA and  has been authorized for detection and/or diagnosis of SARS-CoV-2 by FDA under an Emergency Use Authorization (EUA). This EUA will remain  in effect (meaning this test can be used) for the duration of the COVID-19 declaration under Se ction 564(b)(1) of the Act, 21 U.S.C. section 360bbb-3(b)(1), unless the authorization is terminated or revoked sooner.  Performed at [facility], [place] [address], [place], [place] [address]      Time coordinating discharge: Over 30 minutes  SIGNED:   Darliss Cheney, MD  [facility] 10/04/2021, 9:49 AM  If 7PM-7AM, please contact  night-coverage [email or web address]

## 2021-10-05 DIAGNOSIS — J9690 Respiratory failure, unspecified, unspecified whether with hypoxia or hypercapnia: Secondary | ICD-10-CM | POA: Diagnosis not present

## 2021-10-05 DIAGNOSIS — E871 Hypo-osmolality and hyponatremia: Secondary | ICD-10-CM | POA: Diagnosis not present

## 2021-10-05 DIAGNOSIS — I10 Essential (primary) hypertension: Secondary | ICD-10-CM | POA: Diagnosis not present

## 2021-10-05 DIAGNOSIS — E559 Vitamin D deficiency, unspecified: Secondary | ICD-10-CM | POA: Diagnosis not present

## 2021-10-05 DIAGNOSIS — R339 Retention of urine, unspecified: Secondary | ICD-10-CM | POA: Diagnosis not present

## 2021-10-05 DIAGNOSIS — E785 Hyperlipidemia, unspecified: Secondary | ICD-10-CM | POA: Diagnosis not present

## 2021-10-05 DIAGNOSIS — I5030 Unspecified diastolic (congestive) heart failure: Secondary | ICD-10-CM | POA: Diagnosis not present

## 2021-10-05 DIAGNOSIS — K219 Gastro-esophageal reflux disease without esophagitis: Secondary | ICD-10-CM | POA: Diagnosis not present

## 2021-10-05 DIAGNOSIS — M109 Gout, unspecified: Secondary | ICD-10-CM | POA: Diagnosis not present

## 2021-10-05 DIAGNOSIS — W19XXXD Unspecified fall, subsequent encounter: Secondary | ICD-10-CM | POA: Diagnosis not present

## 2021-10-05 DIAGNOSIS — I748 Embolism and thrombosis of other arteries: Secondary | ICD-10-CM | POA: Diagnosis not present

## 2021-10-05 DIAGNOSIS — R7881 Bacteremia: Secondary | ICD-10-CM | POA: Diagnosis not present

## 2021-10-06 DIAGNOSIS — R7881 Bacteremia: Secondary | ICD-10-CM | POA: Diagnosis not present

## 2021-10-06 DIAGNOSIS — R339 Retention of urine, unspecified: Secondary | ICD-10-CM | POA: Diagnosis not present

## 2021-10-06 DIAGNOSIS — N179 Acute kidney failure, unspecified: Secondary | ICD-10-CM | POA: Diagnosis not present

## 2021-10-06 DIAGNOSIS — A419 Sepsis, unspecified organism: Secondary | ICD-10-CM | POA: Diagnosis not present

## 2021-10-08 DIAGNOSIS — E785 Hyperlipidemia, unspecified: Secondary | ICD-10-CM | POA: Diagnosis not present

## 2021-10-08 DIAGNOSIS — K5900 Constipation, unspecified: Secondary | ICD-10-CM | POA: Diagnosis not present

## 2021-10-08 DIAGNOSIS — E871 Hypo-osmolality and hyponatremia: Secondary | ICD-10-CM | POA: Diagnosis not present

## 2021-10-08 DIAGNOSIS — I5030 Unspecified diastolic (congestive) heart failure: Secondary | ICD-10-CM | POA: Diagnosis not present

## 2021-10-08 DIAGNOSIS — R339 Retention of urine, unspecified: Secondary | ICD-10-CM | POA: Diagnosis not present

## 2021-10-08 DIAGNOSIS — I10 Essential (primary) hypertension: Secondary | ICD-10-CM | POA: Diagnosis not present

## 2021-10-11 DIAGNOSIS — Z8709 Personal history of other diseases of the respiratory system: Secondary | ICD-10-CM | POA: Diagnosis not present

## 2021-10-11 DIAGNOSIS — R54 Age-related physical debility: Secondary | ICD-10-CM | POA: Diagnosis not present

## 2021-10-11 DIAGNOSIS — Z8619 Personal history of other infectious and parasitic diseases: Secondary | ICD-10-CM | POA: Diagnosis not present

## 2021-10-11 DIAGNOSIS — N390 Urinary tract infection, site not specified: Secondary | ICD-10-CM | POA: Diagnosis not present

## 2021-10-11 DIAGNOSIS — R339 Retention of urine, unspecified: Secondary | ICD-10-CM | POA: Diagnosis not present

## 2021-10-12 DIAGNOSIS — R339 Retention of urine, unspecified: Secondary | ICD-10-CM | POA: Diagnosis not present

## 2021-10-14 ENCOUNTER — Ambulatory Visit: Payer: Medicare Other | Admitting: Specialist

## 2021-10-15 DIAGNOSIS — E871 Hypo-osmolality and hyponatremia: Secondary | ICD-10-CM | POA: Diagnosis not present

## 2021-10-15 DIAGNOSIS — R339 Retention of urine, unspecified: Secondary | ICD-10-CM | POA: Diagnosis not present

## 2021-10-15 DIAGNOSIS — I10 Essential (primary) hypertension: Secondary | ICD-10-CM | POA: Diagnosis not present

## 2021-10-15 DIAGNOSIS — I5030 Unspecified diastolic (congestive) heart failure: Secondary | ICD-10-CM | POA: Diagnosis not present

## 2021-10-17 DIAGNOSIS — E1140 Type 2 diabetes mellitus with diabetic neuropathy, unspecified: Secondary | ICD-10-CM | POA: Diagnosis not present

## 2021-10-17 DIAGNOSIS — M109 Gout, unspecified: Secondary | ICD-10-CM | POA: Diagnosis not present

## 2021-10-17 DIAGNOSIS — A4189 Other specified sepsis: Secondary | ICD-10-CM | POA: Diagnosis not present

## 2021-10-17 DIAGNOSIS — Z792 Long term (current) use of antibiotics: Secondary | ICD-10-CM | POA: Diagnosis not present

## 2021-10-17 DIAGNOSIS — K900 Celiac disease: Secondary | ICD-10-CM | POA: Diagnosis not present

## 2021-10-17 DIAGNOSIS — E871 Hypo-osmolality and hyponatremia: Secondary | ICD-10-CM | POA: Diagnosis not present

## 2021-10-17 DIAGNOSIS — Z9181 History of falling: Secondary | ICD-10-CM | POA: Diagnosis not present

## 2021-10-17 DIAGNOSIS — R339 Retention of urine, unspecified: Secondary | ICD-10-CM | POA: Diagnosis not present

## 2021-10-17 DIAGNOSIS — G8929 Other chronic pain: Secondary | ICD-10-CM | POA: Diagnosis not present

## 2021-10-17 DIAGNOSIS — N390 Urinary tract infection, site not specified: Secondary | ICD-10-CM | POA: Diagnosis not present

## 2021-10-17 DIAGNOSIS — I10 Essential (primary) hypertension: Secondary | ICD-10-CM | POA: Diagnosis not present

## 2021-10-20 DIAGNOSIS — I10 Essential (primary) hypertension: Secondary | ICD-10-CM | POA: Diagnosis not present

## 2021-10-20 DIAGNOSIS — Z9181 History of falling: Secondary | ICD-10-CM | POA: Diagnosis not present

## 2021-10-20 DIAGNOSIS — A4189 Other specified sepsis: Secondary | ICD-10-CM | POA: Diagnosis not present

## 2021-10-20 DIAGNOSIS — R339 Retention of urine, unspecified: Secondary | ICD-10-CM | POA: Diagnosis not present

## 2021-10-20 DIAGNOSIS — G8929 Other chronic pain: Secondary | ICD-10-CM | POA: Diagnosis not present

## 2021-10-20 DIAGNOSIS — N390 Urinary tract infection, site not specified: Secondary | ICD-10-CM | POA: Diagnosis not present

## 2021-10-20 DIAGNOSIS — Z792 Long term (current) use of antibiotics: Secondary | ICD-10-CM | POA: Diagnosis not present

## 2021-10-20 DIAGNOSIS — M109 Gout, unspecified: Secondary | ICD-10-CM | POA: Diagnosis not present

## 2021-10-20 DIAGNOSIS — E871 Hypo-osmolality and hyponatremia: Secondary | ICD-10-CM | POA: Diagnosis not present

## 2021-10-20 DIAGNOSIS — E1140 Type 2 diabetes mellitus with diabetic neuropathy, unspecified: Secondary | ICD-10-CM | POA: Diagnosis not present

## 2021-10-20 DIAGNOSIS — K900 Celiac disease: Secondary | ICD-10-CM | POA: Diagnosis not present

## 2021-10-21 DIAGNOSIS — A4189 Other specified sepsis: Secondary | ICD-10-CM | POA: Diagnosis not present

## 2021-10-21 DIAGNOSIS — Z792 Long term (current) use of antibiotics: Secondary | ICD-10-CM | POA: Diagnosis not present

## 2021-10-21 DIAGNOSIS — M109 Gout, unspecified: Secondary | ICD-10-CM | POA: Diagnosis not present

## 2021-10-21 DIAGNOSIS — G8929 Other chronic pain: Secondary | ICD-10-CM | POA: Diagnosis not present

## 2021-10-21 DIAGNOSIS — E1140 Type 2 diabetes mellitus with diabetic neuropathy, unspecified: Secondary | ICD-10-CM | POA: Diagnosis not present

## 2021-10-21 DIAGNOSIS — Z9181 History of falling: Secondary | ICD-10-CM | POA: Diagnosis not present

## 2021-10-21 DIAGNOSIS — I10 Essential (primary) hypertension: Secondary | ICD-10-CM | POA: Diagnosis not present

## 2021-10-21 DIAGNOSIS — K900 Celiac disease: Secondary | ICD-10-CM | POA: Diagnosis not present

## 2021-10-21 DIAGNOSIS — E871 Hypo-osmolality and hyponatremia: Secondary | ICD-10-CM | POA: Diagnosis not present

## 2021-10-21 DIAGNOSIS — R339 Retention of urine, unspecified: Secondary | ICD-10-CM | POA: Diagnosis not present

## 2021-10-21 DIAGNOSIS — N390 Urinary tract infection, site not specified: Secondary | ICD-10-CM | POA: Diagnosis not present

## 2021-10-25 DIAGNOSIS — M109 Gout, unspecified: Secondary | ICD-10-CM | POA: Diagnosis not present

## 2021-10-25 DIAGNOSIS — E871 Hypo-osmolality and hyponatremia: Secondary | ICD-10-CM | POA: Diagnosis not present

## 2021-10-25 DIAGNOSIS — E1140 Type 2 diabetes mellitus with diabetic neuropathy, unspecified: Secondary | ICD-10-CM | POA: Diagnosis not present

## 2021-10-25 DIAGNOSIS — Z9181 History of falling: Secondary | ICD-10-CM | POA: Diagnosis not present

## 2021-10-25 DIAGNOSIS — Z792 Long term (current) use of antibiotics: Secondary | ICD-10-CM | POA: Diagnosis not present

## 2021-10-25 DIAGNOSIS — R001 Bradycardia, unspecified: Secondary | ICD-10-CM | POA: Diagnosis not present

## 2021-10-25 DIAGNOSIS — A4189 Other specified sepsis: Secondary | ICD-10-CM | POA: Diagnosis not present

## 2021-10-25 DIAGNOSIS — N390 Urinary tract infection, site not specified: Secondary | ICD-10-CM | POA: Diagnosis not present

## 2021-10-25 DIAGNOSIS — R339 Retention of urine, unspecified: Secondary | ICD-10-CM | POA: Diagnosis not present

## 2021-10-25 DIAGNOSIS — G8929 Other chronic pain: Secondary | ICD-10-CM | POA: Diagnosis not present

## 2021-10-25 DIAGNOSIS — K900 Celiac disease: Secondary | ICD-10-CM | POA: Diagnosis not present

## 2021-10-25 DIAGNOSIS — I10 Essential (primary) hypertension: Secondary | ICD-10-CM | POA: Diagnosis not present

## 2021-10-27 ENCOUNTER — Telehealth: Payer: Self-pay

## 2021-10-27 DIAGNOSIS — R350 Frequency of micturition: Secondary | ICD-10-CM | POA: Diagnosis not present

## 2021-10-27 DIAGNOSIS — R3914 Feeling of incomplete bladder emptying: Secondary | ICD-10-CM | POA: Diagnosis not present

## 2021-10-27 DIAGNOSIS — N390 Urinary tract infection, site not specified: Secondary | ICD-10-CM | POA: Diagnosis not present

## 2021-10-27 DIAGNOSIS — N1831 Chronic kidney disease, stage 3a: Secondary | ICD-10-CM | POA: Diagnosis not present

## 2021-10-27 DIAGNOSIS — R338 Other retention of urine: Secondary | ICD-10-CM | POA: Diagnosis not present

## 2021-10-27 DIAGNOSIS — E871 Hypo-osmolality and hyponatremia: Secondary | ICD-10-CM | POA: Diagnosis not present

## 2021-10-27 DIAGNOSIS — Z5181 Encounter for therapeutic drug level monitoring: Secondary | ICD-10-CM | POA: Diagnosis not present

## 2021-10-27 DIAGNOSIS — R001 Bradycardia, unspecified: Secondary | ICD-10-CM | POA: Diagnosis not present

## 2021-10-27 DIAGNOSIS — R3916 Straining to void: Secondary | ICD-10-CM | POA: Diagnosis not present

## 2021-10-27 DIAGNOSIS — R54 Age-related physical debility: Secondary | ICD-10-CM | POA: Diagnosis not present

## 2021-10-27 NOTE — Telephone Encounter (Signed)
Needs OV with one of [place] and an EKG

## 2021-10-28 DIAGNOSIS — Z9181 History of falling: Secondary | ICD-10-CM | POA: Diagnosis not present

## 2021-10-28 DIAGNOSIS — G8929 Other chronic pain: Secondary | ICD-10-CM | POA: Diagnosis not present

## 2021-10-28 DIAGNOSIS — K900 Celiac disease: Secondary | ICD-10-CM | POA: Diagnosis not present

## 2021-10-28 DIAGNOSIS — E871 Hypo-osmolality and hyponatremia: Secondary | ICD-10-CM | POA: Diagnosis not present

## 2021-10-28 DIAGNOSIS — A4189 Other specified sepsis: Secondary | ICD-10-CM | POA: Diagnosis not present

## 2021-10-28 DIAGNOSIS — I10 Essential (primary) hypertension: Secondary | ICD-10-CM | POA: Diagnosis not present

## 2021-10-28 DIAGNOSIS — Z792 Long term (current) use of antibiotics: Secondary | ICD-10-CM | POA: Diagnosis not present

## 2021-10-28 DIAGNOSIS — R339 Retention of urine, unspecified: Secondary | ICD-10-CM | POA: Diagnosis not present

## 2021-10-28 DIAGNOSIS — N390 Urinary tract infection, site not specified: Secondary | ICD-10-CM | POA: Diagnosis not present

## 2021-10-28 DIAGNOSIS — M109 Gout, unspecified: Secondary | ICD-10-CM | POA: Diagnosis not present

## 2021-10-28 DIAGNOSIS — E1140 Type 2 diabetes mellitus with diabetic neuropathy, unspecified: Secondary | ICD-10-CM | POA: Diagnosis not present

## 2021-10-28 NOTE — Telephone Encounter (Signed)
Spoke to patient's daughter she is aware and appointment was scheduled

## 2021-10-29 ENCOUNTER — Encounter: Payer: Self-pay | Admitting: Cardiology

## 2021-10-29 ENCOUNTER — Other Ambulatory Visit: Payer: Self-pay

## 2021-10-29 ENCOUNTER — Ambulatory Visit: Payer: Medicare Other | Admitting: Cardiology

## 2021-10-29 VITALS — BP 124/40 | HR 50 | Temp 97.4°F | Resp 16 | Ht <= 58 in | Wt 178.6 lb

## 2021-10-29 DIAGNOSIS — K900 Celiac disease: Secondary | ICD-10-CM | POA: Diagnosis not present

## 2021-10-29 DIAGNOSIS — I10 Essential (primary) hypertension: Secondary | ICD-10-CM | POA: Diagnosis not present

## 2021-10-29 DIAGNOSIS — R42 Dizziness and giddiness: Secondary | ICD-10-CM | POA: Diagnosis not present

## 2021-10-29 DIAGNOSIS — E871 Hypo-osmolality and hyponatremia: Secondary | ICD-10-CM | POA: Diagnosis not present

## 2021-10-29 DIAGNOSIS — G8929 Other chronic pain: Secondary | ICD-10-CM | POA: Diagnosis not present

## 2021-10-29 DIAGNOSIS — R001 Bradycardia, unspecified: Secondary | ICD-10-CM | POA: Diagnosis not present

## 2021-10-29 DIAGNOSIS — N390 Urinary tract infection, site not specified: Secondary | ICD-10-CM | POA: Diagnosis not present

## 2021-10-29 DIAGNOSIS — Z9181 History of falling: Secondary | ICD-10-CM | POA: Diagnosis not present

## 2021-10-29 DIAGNOSIS — M109 Gout, unspecified: Secondary | ICD-10-CM | POA: Diagnosis not present

## 2021-10-29 DIAGNOSIS — Z792 Long term (current) use of antibiotics: Secondary | ICD-10-CM | POA: Diagnosis not present

## 2021-10-29 DIAGNOSIS — E1140 Type 2 diabetes mellitus with diabetic neuropathy, unspecified: Secondary | ICD-10-CM | POA: Diagnosis not present

## 2021-10-29 DIAGNOSIS — I444 Left anterior fascicular block: Secondary | ICD-10-CM | POA: Diagnosis not present

## 2021-10-29 DIAGNOSIS — R339 Retention of urine, unspecified: Secondary | ICD-10-CM | POA: Diagnosis not present

## 2021-10-29 DIAGNOSIS — A4189 Other specified sepsis: Secondary | ICD-10-CM | POA: Diagnosis not present

## 2021-10-29 NOTE — Progress Notes (Signed)
Primary Physician/Referring:  [name], MD  Patient ID: Deborah Jordan, female    DOB: 1933-05-07, 87 y.o.   MRN: [id number]  Chief Complaint  Patient presents with   Dizziness   low HR   HPI:    Deborah Jordan  is a 87 y.o. with hypertension, hyperlipidemia, history of chronic kidney disease, hyperglycemia, bifascicular block on EKG, chronic palpitations which are improved with beta-blocker therapy.  She was last admitted in July 2022 with hyponatremia due to excessive fluid intake as she was having dysphagia for solid foods.  She has recuperated well from this, again admitted in October 2022 a month ago for urosepsis.  She called our office stating she has marked decrease in heart rate and wanted to be evaluated.  In view of underlying left anterior fascicular block and age as risk factor for heart block, I brought her in to be evaluated. Her son is present at the bedside today.  She was seen by her PCP yesterday Dr. Leafy Half and carvedilol was discontinued.  Although bradycardic, patient denies any dizziness or syncope.  She has not had any chest pain, no PND or orthopnea.  Past Medical History:  Diagnosis Date   Anxiety    Arthritis    osteoarthritis. spinal stenosis. Scoliosis of spine-degenerative spine.   Bilateral cataracts    CAD (coronary artery disease)    minimal, improved on right 06/2016   Chronic kidney disease    STAGE 4   DDD (degenerative disc disease), lumbar    Dyspnea    Elevated cholesterol    GERD (gastroesophageal reflux disease)    controls with Nexium   Grade I diastolic dysfunction 10/93/2355   Noted on ECHO   History of cardiomegaly    History of gallstones    Hypertension    LVH (left ventricular hypertrophy) 05/04/2017   Mil, noted on ECHO   Mild depression    Pre-diabetes    RBBB (right bundle branch block)    Spinal stenosis    Tubular adenoma    and benign polyps   Vitamin D deficiency    Wears partial dentures    Past Surgical  History:  Procedure Laterality Date   CATARACT EXTRACTION Bilateral 03/06/2013   CHOLECYSTECTOMY     COLONOSCOPY     CRYOTHERAPY     DILATION AND CURETTAGE OF UTERUS     esi     HEMORRHOID SURGERY N/A 09/21/2018   Procedure: SINGLE COLUMN HEMORRHOIDECTOMY, HEMORRHOIDPEXY;  Surgeon: Leighton Ruff, MD;  Location: [facility];  Service: General;  Laterality: N/A;   TOTAL HIP ARTHROPLASTY Left 03/08/2013   Procedure: LEFT TOTAL HIP ARTHROPLASTY ANTERIOR APPROACH;  Surgeon: Mcarthur Rossetti, MD;  Location: [facility];  Service: Orthopedics;  Laterality: Left;   Social History   Tobacco Use   Smoking status: Never   Smokeless tobacco: Never  Substance Use Topics   Alcohol use: Yes    Comment: OCC    ROS  Review of Systems  Constitutional: Positive for malaise/fatigue.  Cardiovascular:  Negative for chest pain, dyspnea on exertion, leg swelling (mild) and palpitations.  Gastrointestinal:  Negative for melena.  Objective  Blood pressure (!) 124/40, pulse (!) 50, temperature (!) 97.4 F (36.3 C), temperature source Temporal, resp. rate 16, height _0  (1.448 m), weight 178 lb 9.6 oz (81 kg), SpO2 95 %.  Vitals with BMI 10/29/2021 10/04/2021 10/04/2021  Height _1  - -  Weight 178 lbs 10 oz - -  BMI 38.64 - -  Systolic 654 650 354  Diastolic 40 49 66  Pulse 50 75 79     Physical Exam Constitutional:      Comments: Short stature and moderately obese  Neck:     Vascular: No carotid bruit (Right carotid bruit) or JVD.  Cardiovascular:     Rate and Rhythm: Normal rate and regular rhythm.     Pulses:          Dorsalis pedis pulses are 1+ on the right side and 1+ on the left side.       Posterior tibial pulses are 0 on the right side and 0 on the left side.     Heart sounds: Murmur heard.  Early systolic murmur is present with a grade of 2/6 radiating to the apex.  Pulmonary:     Effort: Pulmonary effort is normal. No accessory muscle usage or respiratory  distress.     Breath sounds: Normal breath sounds.  Abdominal:     General: Bowel sounds are normal.     Palpations: Abdomen is soft.  Musculoskeletal:        General: Swelling (Bilateral 1-2+ ankle edema) present. Normal range of motion.   Laboratory examination:   External labs  11/05/2019 :   Labs 06/08/2020:   Serum glucose 98 mg, BUN 21, creatinine 0.98, EGFR 54 mL, sodium 135, potassium 4.6, CMP otherwise normal.  A1c 6.2%.  Hb 12.6/HCT 37.5, platelets 470.   Cholesterol, total 145.000 11/05/2019 HDL 53.000 11/05/2019 LDL 75.000 11/05/2019 Triglycerides 87.000 11/05/2019 A1C 6.100 11/05/2019 Hemoglobin 12.600 11/05/2019 Creatinine, Serum 1.050 11/05/2019 Potassium 4.800 11/05/2019 Magnesium N/D ALT (SGPT) 10.000 11/05/2019 TSH 2.240 11/05/2019  Medications and allergies   Allergies  Allergen Reactions   Ace Inhibitors Cough   Hydrochlorothiazide     Low Sodium    Current Outpatient Medications on File Prior to Visit  Medication Sig Dispense Refill   acetaminophen (TYLENOL) 650 MG CR tablet Take 650 mg by mouth every 8 (eight) hours as needed for pain.     ALPRAZolam (XANAX) 0.25 MG tablet Take 1 tablet (0.25 mg total) by mouth 3 (three) times daily as needed for anxiety. 30 tablet 0   amLODipine (NORVASC) 10 MG tablet Take 0.5 tablets (5 mg total) by mouth daily. 15 tablet 2   aspirin EC 81 MG tablet Take 81 mg by mouth daily. Swallow whole.     atorvastatin (LIPITOR) 20 MG tablet Take 1 tablet (20 mg total) by mouth daily. 30 tablet 0   Cholecalciferol (VITAMIN D) 2000 units CAPS Take 2,000 Units by mouth daily.     DULoxetine (CYMBALTA) 20 MG capsule Take 1 capsule (20 mg total) by mouth daily. 30 capsule 3   esomeprazole (NEXIUM) 40 MG capsule Take 40 mg by mouth daily.     gabapentin (NEURONTIN) 100 MG capsule Take 1 capsule (100 mg total) by mouth at bedtime. 30 capsule 3   loratadine (CLARITIN) 10 MG tablet Take 10 mg by mouth daily.     tamsulosin  (FLOMAX) 0.4 MG CAPS capsule Take 1 capsule (0.4 mg total) by mouth daily after supper. 30 capsule 0   sulfamethoxazole-trimethoprim (BACTRIM DS) 800-160 MG tablet Take 1 tablet by mouth 2 (two) times daily.     No current facility-administered medications on file prior to visit.     Radiology:   CT angiogram of the chest and abdomen and pelvis 09/22/2021: 1. No acute cardiopulmonary disease. Specifically, no evidence of thoracic aortic aneurysm, dissection or central pulmonary embolism. 2. Coronary  artery calcifications. 4. 2. Moderate to large amount of atherosclerotic plaque within a normal caliber abdominal aorta, not resulting in a hemodynamically significant stenosis. 5. 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 The IMA is diseased at its origin though remains patent, though also with collateral supply from the SMA. Presently, there is no evidence of acute mesenteric ischemia. 6. Suspected hemodynamically significant narrowing involving the origin of the left renal artery  7.  Colonic diverticulosis.  Cardiac Studies:    Echocardiogram 09/24/2021:    1. Left ventricular ejection fraction, by estimation, is 55 to 60%. The left ventricle has normal function. The left ventricle has no regional wall motion abnormalities.  2. Right ventricular systolic function is normal. The right ventricular size is normal.  3. Left atrial size was mildly dilated.  4. Mild mitral valve regurgitation. There is moderate holosystolic prolapse of the middle scallop of the posterior leaflet of the mitral valve.  5. The aortic valve is calcified. Aortic valve regurgitation is not visualized.   Conclusion(s)/Recommendation(s): No evidence of valvular vegetations on this transthoracic echocardiogram. Would recommend a transesophageal echocardiogram to exclude infective endocarditis if clinically indicated.  Carotid artery duplex  07/06/2021: Duplex suggests stenosis in the right internal carotid artery (1-15%). Duplex suggests stenosis in the left internal carotid artery (16-49%). Duplex suggests stenosis in the left external carotid artery (<50%). Antegrade right vertebral artery flow. Antegrade left vertebral artery flow. Follow up in one year is appropriate if clinically indicated. EKG:   EKG 10/29/2021: Marked sinus bradycardia at rate of 52 bpm, left atrial enlargement, left axis deviation, left anterior fascicular block.  Right bundle branch block.  Poor R wave progression, cannot exclude anterolateral infarct old.  LVH with repolarization abnormality.  Compared to 01/26/2021, heart rate was previously 70 bpm but otherwise no change.  Assessment     ICD-10-CM   1. LAFB (left anterior fascicular block)  I44.4     2. Bradycardia by electrocardiogram  R00.1     3. Primary hypertension  I10 amLODipine (NORVASC) 10 MG tablet    4. Dizziness  R42 EKG 12-Lead      No orders of the defined types were placed in this encounter.  Medications Discontinued During This Encounter  Medication Reason   carvedilol (COREG) 12.5 MG tablet Patient has not taken in last 30 days   amLODipine (NORVASC) 10 MG tablet Dose change     Recommendations:   Deborah Jordan  is a 87 y.o.  with hypertension, hyperlipidemia, history of chronic kidney disease, hyperglycemia, bifascicular block on EKG, chronic palpitations which are improved with beta-blocker therapy.  She was last admitted in July 2022 with hyponatremia due to excessive fluid intake as she was having dysphagia for solid foods.    She has recuperated well from this, again admitted in October 2022 a month ago for urosepsis.  She called our office stating she has marked decrease in heart rate and wanted to be evaluated.  In view of underlying left anterior fascicular block and age as risk factor for heart block, I brought her in to be evaluated.  She was seen by her PCP  yesterday Dr. Leafy Half and carvedilol was discontinued.  Heart rate was 47 bpm yesterday.  Although heart rate was low, she was asymptomatic.  It is already improved today.  I will further reduce the dose of the amlodipine as well to 5 mg from 10 mg daily, would like to  repeat EKG in [redacted] weeks along with a office visit follow-up for hypertension.  Otherwise from cardiac standpoint she has done well, weight loss was again discussed with the patient, although 87 years of age from cardiac standpoint she is doing extremely well.  There is no clinical evidence of heart failure today.   Adrian Prows, MD, [facility] 10/29/2021, 11:22 AM Office: [phone] Pager: [phone]

## 2021-10-30 DIAGNOSIS — R339 Retention of urine, unspecified: Secondary | ICD-10-CM | POA: Diagnosis not present

## 2021-10-30 DIAGNOSIS — N390 Urinary tract infection, site not specified: Secondary | ICD-10-CM | POA: Diagnosis not present

## 2021-10-30 DIAGNOSIS — G8929 Other chronic pain: Secondary | ICD-10-CM | POA: Diagnosis not present

## 2021-10-30 DIAGNOSIS — Z9181 History of falling: Secondary | ICD-10-CM | POA: Diagnosis not present

## 2021-10-30 DIAGNOSIS — Z792 Long term (current) use of antibiotics: Secondary | ICD-10-CM | POA: Diagnosis not present

## 2021-10-30 DIAGNOSIS — E871 Hypo-osmolality and hyponatremia: Secondary | ICD-10-CM | POA: Diagnosis not present

## 2021-10-30 DIAGNOSIS — M109 Gout, unspecified: Secondary | ICD-10-CM | POA: Diagnosis not present

## 2021-10-30 DIAGNOSIS — I10 Essential (primary) hypertension: Secondary | ICD-10-CM | POA: Diagnosis not present

## 2021-10-30 DIAGNOSIS — A4189 Other specified sepsis: Secondary | ICD-10-CM | POA: Diagnosis not present

## 2021-10-30 DIAGNOSIS — K900 Celiac disease: Secondary | ICD-10-CM | POA: Diagnosis not present

## 2021-10-30 DIAGNOSIS — E1140 Type 2 diabetes mellitus with diabetic neuropathy, unspecified: Secondary | ICD-10-CM | POA: Diagnosis not present

## 2021-10-31 ENCOUNTER — Other Ambulatory Visit: Payer: Self-pay

## 2021-10-31 ENCOUNTER — Emergency Department (HOSPITAL_COMMUNITY): Payer: Medicare Other

## 2021-10-31 ENCOUNTER — Inpatient Hospital Stay (HOSPITAL_COMMUNITY)
Admission: EM | Admit: 2021-10-31 | Discharge: 2021-11-11 | DRG: 242 | Disposition: A | Discharge status: Skilled Nursing Facility | Payer: Medicare Other | Attending: Internal Medicine | Admitting: Internal Medicine

## 2021-10-31 DIAGNOSIS — I5033 Acute on chronic diastolic (congestive) heart failure: Secondary | ICD-10-CM | POA: Diagnosis present

## 2021-10-31 DIAGNOSIS — E7800 Pure hypercholesterolemia, unspecified: Secondary | ICD-10-CM | POA: Diagnosis present

## 2021-10-31 DIAGNOSIS — R7303 Prediabetes: Secondary | ICD-10-CM | POA: Diagnosis present

## 2021-10-31 DIAGNOSIS — I2510 Atherosclerotic heart disease of native coronary artery without angina pectoris: Secondary | ICD-10-CM | POA: Diagnosis not present

## 2021-10-31 DIAGNOSIS — D638 Anemia in other chronic diseases classified elsewhere: Secondary | ICD-10-CM | POA: Diagnosis not present

## 2021-10-31 DIAGNOSIS — I1310 Hypertensive heart and chronic kidney disease without heart failure, with stage 1 through stage 4 chronic kidney disease, or unspecified chronic kidney disease: Secondary | ICD-10-CM | POA: Diagnosis not present

## 2021-10-31 DIAGNOSIS — R531 Weakness: Secondary | ICD-10-CM | POA: Diagnosis not present

## 2021-10-31 DIAGNOSIS — E871 Hypo-osmolality and hyponatremia: Secondary | ICD-10-CM | POA: Diagnosis present

## 2021-10-31 DIAGNOSIS — Z743 Need for continuous supervision: Secondary | ICD-10-CM | POA: Diagnosis not present

## 2021-10-31 DIAGNOSIS — J810 Acute pulmonary edema: Secondary | ICD-10-CM | POA: Diagnosis not present

## 2021-10-31 DIAGNOSIS — F32A Depression, unspecified: Secondary | ICD-10-CM | POA: Diagnosis present

## 2021-10-31 DIAGNOSIS — R079 Chest pain, unspecified: Secondary | ICD-10-CM

## 2021-10-31 DIAGNOSIS — Z6839 Body mass index (BMI) 39.0-39.9, adult: Secondary | ICD-10-CM

## 2021-10-31 DIAGNOSIS — Z888 Allergy status to other drugs, medicaments and biological substances status: Secondary | ICD-10-CM

## 2021-10-31 DIAGNOSIS — I10 Essential (primary) hypertension: Secondary | ICD-10-CM | POA: Diagnosis present

## 2021-10-31 DIAGNOSIS — N170 Acute kidney failure with tubular necrosis: Secondary | ICD-10-CM | POA: Diagnosis not present

## 2021-10-31 DIAGNOSIS — B370 Candidal stomatitis: Secondary | ICD-10-CM | POA: Diagnosis present

## 2021-10-31 DIAGNOSIS — I442 Atrioventricular block, complete: Secondary | ICD-10-CM | POA: Diagnosis present

## 2021-10-31 DIAGNOSIS — I3489 Other nonrheumatic mitral valve disorders: Secondary | ICD-10-CM | POA: Diagnosis not present

## 2021-10-31 DIAGNOSIS — J90 Pleural effusion, not elsewhere classified: Secondary | ICD-10-CM | POA: Diagnosis not present

## 2021-10-31 DIAGNOSIS — R300 Dysuria: Secondary | ICD-10-CM | POA: Diagnosis not present

## 2021-10-31 DIAGNOSIS — I6523 Occlusion and stenosis of bilateral carotid arteries: Secondary | ICD-10-CM

## 2021-10-31 DIAGNOSIS — J9601 Acute respiratory failure with hypoxia: Secondary | ICD-10-CM | POA: Diagnosis not present

## 2021-10-31 DIAGNOSIS — I509 Heart failure, unspecified: Secondary | ICD-10-CM | POA: Diagnosis not present

## 2021-10-31 DIAGNOSIS — Z8249 Family history of ischemic heart disease and other diseases of the circulatory system: Secondary | ICD-10-CM

## 2021-10-31 DIAGNOSIS — E222 Syndrome of inappropriate secretion of antidiuretic hormone: Secondary | ICD-10-CM | POA: Diagnosis not present

## 2021-10-31 DIAGNOSIS — I452 Bifascicular block: Secondary | ICD-10-CM | POA: Diagnosis present

## 2021-10-31 DIAGNOSIS — Z20822 Contact with and (suspected) exposure to covid-19: Secondary | ICD-10-CM | POA: Diagnosis not present

## 2021-10-31 DIAGNOSIS — E876 Hypokalemia: Secondary | ICD-10-CM | POA: Diagnosis not present

## 2021-10-31 DIAGNOSIS — I700 Atherosclerosis of aorta: Secondary | ICD-10-CM | POA: Diagnosis not present

## 2021-10-31 DIAGNOSIS — K649 Unspecified hemorrhoids: Secondary | ICD-10-CM | POA: Diagnosis present

## 2021-10-31 DIAGNOSIS — K219 Gastro-esophageal reflux disease without esophagitis: Secondary | ICD-10-CM | POA: Diagnosis not present

## 2021-10-31 DIAGNOSIS — D649 Anemia, unspecified: Secondary | ICD-10-CM | POA: Diagnosis present

## 2021-10-31 DIAGNOSIS — E440 Moderate protein-calorie malnutrition: Secondary | ICD-10-CM | POA: Diagnosis present

## 2021-10-31 DIAGNOSIS — J9811 Atelectasis: Secondary | ICD-10-CM | POA: Diagnosis not present

## 2021-10-31 DIAGNOSIS — I130 Hypertensive heart and chronic kidney disease with heart failure and stage 1 through stage 4 chronic kidney disease, or unspecified chronic kidney disease: Principal | ICD-10-CM | POA: Diagnosis present

## 2021-10-31 DIAGNOSIS — I4430 Unspecified atrioventricular block: Secondary | ICD-10-CM

## 2021-10-31 DIAGNOSIS — R570 Cardiogenic shock: Secondary | ICD-10-CM | POA: Diagnosis present

## 2021-10-31 DIAGNOSIS — I499 Cardiac arrhythmia, unspecified: Secondary | ICD-10-CM | POA: Diagnosis not present

## 2021-10-31 DIAGNOSIS — I083 Combined rheumatic disorders of mitral, aortic and tricuspid valves: Secondary | ICD-10-CM | POA: Diagnosis not present

## 2021-10-31 DIAGNOSIS — R9389 Abnormal findings on diagnostic imaging of other specified body structures: Secondary | ICD-10-CM | POA: Diagnosis not present

## 2021-10-31 DIAGNOSIS — N1831 Chronic kidney disease, stage 3a: Secondary | ICD-10-CM | POA: Diagnosis present

## 2021-10-31 DIAGNOSIS — E861 Hypovolemia: Secondary | ICD-10-CM | POA: Diagnosis not present

## 2021-10-31 DIAGNOSIS — R778 Other specified abnormalities of plasma proteins: Secondary | ICD-10-CM | POA: Diagnosis not present

## 2021-10-31 DIAGNOSIS — R0689 Other abnormalities of breathing: Secondary | ICD-10-CM | POA: Diagnosis not present

## 2021-10-31 DIAGNOSIS — E669 Obesity, unspecified: Secondary | ICD-10-CM | POA: Diagnosis present

## 2021-10-31 DIAGNOSIS — Z9841 Cataract extraction status, right eye: Secondary | ICD-10-CM

## 2021-10-31 DIAGNOSIS — I441 Atrioventricular block, second degree: Secondary | ICD-10-CM | POA: Diagnosis not present

## 2021-10-31 DIAGNOSIS — I272 Pulmonary hypertension, unspecified: Secondary | ICD-10-CM | POA: Diagnosis present

## 2021-10-31 DIAGNOSIS — N179 Acute kidney failure, unspecified: Secondary | ICD-10-CM | POA: Diagnosis not present

## 2021-10-31 DIAGNOSIS — R0603 Acute respiratory distress: Secondary | ICD-10-CM | POA: Diagnosis present

## 2021-10-31 DIAGNOSIS — Z833 Family history of diabetes mellitus: Secondary | ICD-10-CM

## 2021-10-31 DIAGNOSIS — R0602 Shortness of breath: Secondary | ICD-10-CM

## 2021-10-31 DIAGNOSIS — I5031 Acute diastolic (congestive) heart failure: Secondary | ICD-10-CM | POA: Diagnosis present

## 2021-10-31 DIAGNOSIS — E559 Vitamin D deficiency, unspecified: Secondary | ICD-10-CM | POA: Diagnosis present

## 2021-10-31 DIAGNOSIS — R001 Bradycardia, unspecified: Secondary | ICD-10-CM | POA: Diagnosis present

## 2021-10-31 DIAGNOSIS — I340 Nonrheumatic mitral (valve) insufficiency: Secondary | ICD-10-CM | POA: Diagnosis present

## 2021-10-31 DIAGNOSIS — Z7982 Long term (current) use of aspirin: Secondary | ICD-10-CM

## 2021-10-31 DIAGNOSIS — Z96642 Presence of left artificial hip joint: Secondary | ICD-10-CM | POA: Diagnosis present

## 2021-10-31 DIAGNOSIS — Z9842 Cataract extraction status, left eye: Secondary | ICD-10-CM

## 2021-10-31 DIAGNOSIS — I517 Cardiomegaly: Secondary | ICD-10-CM | POA: Diagnosis not present

## 2021-10-31 DIAGNOSIS — F419 Anxiety disorder, unspecified: Secondary | ICD-10-CM | POA: Diagnosis present

## 2021-10-31 DIAGNOSIS — R6889 Other general symptoms and signs: Secondary | ICD-10-CM | POA: Diagnosis not present

## 2021-10-31 DIAGNOSIS — J811 Chronic pulmonary edema: Secondary | ICD-10-CM | POA: Diagnosis not present

## 2021-10-31 DIAGNOSIS — Z95818 Presence of other cardiac implants and grafts: Secondary | ICD-10-CM

## 2021-10-31 DIAGNOSIS — N171 Acute kidney failure with acute cortical necrosis: Secondary | ICD-10-CM | POA: Diagnosis not present

## 2021-10-31 DIAGNOSIS — K121 Other forms of stomatitis: Secondary | ICD-10-CM | POA: Diagnosis present

## 2021-10-31 DIAGNOSIS — Z79899 Other long term (current) drug therapy: Secondary | ICD-10-CM

## 2021-10-31 DIAGNOSIS — I455 Other specified heart block: Secondary | ICD-10-CM | POA: Diagnosis not present

## 2021-10-31 LAB — CBC WITH DIFFERENTIAL/PLATELET
Abs Immature Granulocytes: 0.06 10*3/uL (ref 0.00–0.07)
Basophils Absolute: 0 10*3/uL (ref 0.0–0.1)
Basophils Relative: 0 %
Eosinophils Absolute: 0 10*3/uL (ref 0.0–0.5)
Eosinophils Relative: 0 %
HCT: 29 % — ABNORMAL LOW (ref 36.0–46.0)
Hemoglobin: 9.8 g/dL — ABNORMAL LOW (ref 12.0–15.0)
Immature Granulocytes: 1 %
Lymphocytes Relative: 12 %
Lymphs Abs: 1.2 10*3/uL (ref 0.7–4.0)
MCH: 29.1 pg (ref 26.0–34.0)
MCHC: 33.8 g/dL (ref 30.0–36.0)
MCV: 86.1 fL (ref 80.0–100.0)
Monocytes Absolute: 0.8 10*3/uL (ref 0.1–1.0)
Monocytes Relative: 8 %
Neutro Abs: 7.6 10*3/uL (ref 1.7–7.7)
Neutrophils Relative %: 79 %
Platelets: 274 10*3/uL (ref 150–400)
RBC: 3.37 MIL/uL — ABNORMAL LOW (ref 3.87–5.11)
RDW: 14 % (ref 11.5–15.5)
WBC: 9.7 10*3/uL (ref 4.0–10.5)
nRBC: 0 % (ref 0.0–0.2)

## 2021-10-31 LAB — COMPREHENSIVE METABOLIC PANEL
ALT: 13 U/L (ref 0–44)
AST: 20 U/L (ref 15–41)
Albumin: 3.7 g/dL (ref 3.5–5.0)
Alkaline Phosphatase: 67 U/L (ref 38–126)
Anion gap: 13 (ref 5–15)
BUN: 17 mg/dL (ref 8–23)
CO2: 20 mmol/L — ABNORMAL LOW (ref 22–32)
Calcium: 9.1 mg/dL (ref 8.9–10.3)
Chloride: 91 mmol/L — ABNORMAL LOW (ref 98–111)
Creatinine, Ser: 1.66 mg/dL — ABNORMAL HIGH (ref 0.44–1.00)
GFR, Estimated: 30 mL/min — ABNORMAL LOW (ref >60)
Glucose, Bld: 119 mg/dL — ABNORMAL HIGH (ref 70–99)
Potassium: 3.6 mmol/L (ref 3.5–5.1)
Sodium: 124 mmol/L — ABNORMAL LOW (ref 135–145)
Total Bilirubin: 0.7 mg/dL (ref 0.3–1.2)
Total Protein: 6.5 g/dL (ref 6.5–8.1)

## 2021-10-31 LAB — BASIC METABOLIC PANEL
Anion gap: 12 (ref 5–15)
BUN: 17 mg/dL (ref 8–23)
CO2: 19 mmol/L — ABNORMAL LOW (ref 22–32)
Calcium: 9 mg/dL (ref 8.9–10.3)
Chloride: 96 mmol/L — ABNORMAL LOW (ref 98–111)
Creatinine, Ser: 1.59 mg/dL — ABNORMAL HIGH (ref 0.44–1.00)
GFR, Estimated: 31 mL/min — ABNORMAL LOW (ref >60)
Glucose, Bld: 120 mg/dL — ABNORMAL HIGH (ref 70–99)
Potassium: 4.3 mmol/L (ref 3.5–5.1)
Sodium: 127 mmol/L — ABNORMAL LOW (ref 135–145)

## 2021-10-31 LAB — TROPONIN I (HIGH SENSITIVITY)
Troponin I (High Sensitivity): 21 ng/L — ABNORMAL HIGH (ref <18)
Troponin I (High Sensitivity): 20 ng/L — ABNORMAL HIGH (ref <18)

## 2021-10-31 LAB — AMMONIA: Ammonia: 10 umol/L (ref 9–35)

## 2021-10-31 LAB — LACTIC ACID, PLASMA
Lactic Acid, Venous: 0.9 mmol/L (ref 0.5–1.9)
Lactic Acid, Venous: 1.2 mmol/L (ref 0.5–1.9)

## 2021-10-31 LAB — PROTIME-INR
INR: 1.1 (ref 0.8–1.2)
Prothrombin Time: 14.6 seconds (ref 11.4–15.2)

## 2021-10-31 LAB — MAGNESIUM: Magnesium: 1.3 mg/dL — ABNORMAL LOW (ref 1.7–2.4)

## 2021-10-31 LAB — TSH: TSH: 1.355 u[IU]/mL (ref 0.350–4.500)

## 2021-10-31 LAB — GLUCOSE, CAPILLARY: Glucose-Capillary: 117 mg/dL — ABNORMAL HIGH (ref 70–99)

## 2021-10-31 LAB — BRAIN NATRIURETIC PEPTIDE: B Natriuretic Peptide: 525 pg/mL — ABNORMAL HIGH (ref 0.0–100.0)

## 2021-10-31 MED ORDER — SODIUM CHLORIDE 0.9% FLUSH
3.0000 mL | Freq: Two times a day (BID) | INTRAVENOUS | Status: DC
  Administered 2021-10-31 – 2021-11-04 (×6): 3 mL via INTRAVENOUS

## 2021-10-31 MED ORDER — POTASSIUM CHLORIDE CRYS ER 20 MEQ PO TBCR
40.0000 meq | EXTENDED_RELEASE_TABLET | ORAL | Status: AC
  Administered 2021-10-31: 40 meq via ORAL
  Filled 2021-10-31: qty 2

## 2021-10-31 MED ORDER — ALPRAZOLAM 0.25 MG PO TABS
0.2500 mg | ORAL_TABLET | Freq: Three times a day (TID) | ORAL | Status: DC | PRN
  Administered 2021-11-01 (×2): 0.25 mg via ORAL
  Filled 2021-10-31 (×2): qty 1

## 2021-10-31 MED ORDER — SODIUM CHLORIDE 0.9% FLUSH: 3.0000 mL | INTRAVENOUS | Status: DC | PRN

## 2021-10-31 MED ORDER — SODIUM CHLORIDE 0.9 % IV SOLN: 250.0000 mL | INTRAVENOUS | Status: DC | PRN

## 2021-10-31 MED ORDER — DOBUTAMINE IN D5W 4-5 MG/ML-% IV SOLN
2.5000 ug/kg/min | INTRAVENOUS | Status: DC
  Filled 2021-10-31: qty 250

## 2021-10-31 MED ORDER — ALBUTEROL SULFATE (2.5 MG/3ML) 0.083% IN NEBU
2.5000 mg | INHALATION_SOLUTION | Freq: Four times a day (QID) | RESPIRATORY_TRACT | Status: DC | PRN
  Administered 2021-10-31 – 2021-11-01 (×3): 2.5 mg via RESPIRATORY_TRACT
  Filled 2021-10-31: qty 3

## 2021-10-31 MED ORDER — SODIUM CHLORIDE 1 G PO TABS
1.0000 g | ORAL_TABLET | Freq: Two times a day (BID) | ORAL | Status: DC
  Administered 2021-11-02 – 2021-11-07 (×8): 1 g via ORAL
  Filled 2021-10-31 (×15): qty 1

## 2021-10-31 MED ORDER — ALBUTEROL SULFATE (2.5 MG/3ML) 0.083% IN NEBU
INHALATION_SOLUTION | RESPIRATORY_TRACT | Status: AC
  Filled 2021-10-31: qty 3

## 2021-10-31 MED ORDER — MAGNESIUM SULFATE 2 GM/50ML IV SOLN
2.0000 g | Freq: Once | INTRAVENOUS | Status: AC
  Administered 2021-10-31: 2 g via INTRAVENOUS
  Filled 2021-10-31: qty 50

## 2021-10-31 MED ORDER — ENOXAPARIN SODIUM 40 MG/0.4ML IJ SOSY: 40.0000 mg | PREFILLED_SYRINGE | INTRAMUSCULAR | Status: DC

## 2021-10-31 MED ORDER — ONDANSETRON HCL 4 MG/2ML IJ SOLN: 4.0000 mg | Freq: Four times a day (QID) | INTRAMUSCULAR | Status: DC | PRN

## 2021-10-31 MED ORDER — MAGNESIUM OXIDE -MG SUPPLEMENT 400 (240 MG) MG PO TABS
400.0000 mg | ORAL_TABLET | Freq: Two times a day (BID) | ORAL | Status: DC
  Administered 2021-10-31 – 2021-11-11 (×18): 400 mg via ORAL
  Filled 2021-10-31 (×20): qty 1

## 2021-10-31 MED ORDER — FUROSEMIDE 10 MG/ML IJ SOLN
40.0000 mg | Freq: Two times a day (BID) | INTRAMUSCULAR | Status: DC
  Administered 2021-10-31 – 2021-11-01 (×2): 40 mg via INTRAVENOUS
  Filled 2021-10-31 (×3): qty 4

## 2021-10-31 MED ORDER — TAMSULOSIN HCL 0.4 MG PO CAPS
0.4000 mg | ORAL_CAPSULE | Freq: Every day | ORAL | Status: DC
  Administered 2021-10-31 – 2021-11-10 (×10): 0.4 mg via ORAL
  Filled 2021-10-31 (×10): qty 1

## 2021-10-31 MED ORDER — FUROSEMIDE 10 MG/ML IJ SOLN
40.0000 mg | Freq: Once | INTRAMUSCULAR | Status: AC
  Administered 2021-10-31: 40 mg via INTRAVENOUS
  Filled 2021-10-31: qty 4

## 2021-10-31 MED ORDER — HYDRALAZINE HCL 10 MG PO TABS: 10.0000 mg | ORAL_TABLET | Freq: Three times a day (TID) | ORAL | Status: DC | PRN

## 2021-10-31 MED ORDER — ACETAMINOPHEN 325 MG PO TABS
650.0000 mg | ORAL_TABLET | ORAL | Status: DC | PRN
  Administered 2021-11-05 – 2021-11-10 (×5): 650 mg via ORAL
  Filled 2021-10-31 (×5): qty 2

## 2021-10-31 MED ORDER — ASPIRIN EC 81 MG PO TBEC
81.0000 mg | DELAYED_RELEASE_TABLET | Freq: Every day | ORAL | Status: DC
  Administered 2021-10-31 – 2021-11-07 (×5): 81 mg via ORAL
  Filled 2021-10-31 (×7): qty 1

## 2021-10-31 MED ORDER — TRIMETHOPRIM 100 MG PO TABS
100.0000 mg | ORAL_TABLET | Freq: Every day | ORAL | Status: DC
  Administered 2021-10-31 – 2021-11-10 (×8): 100 mg via ORAL
  Filled 2021-10-31 (×11): qty 1

## 2021-10-31 MED ORDER — FUROSEMIDE 10 MG/ML IJ SOLN
20.0000 mg | Freq: Once | INTRAMUSCULAR | Status: AC
  Administered 2021-10-31: 20 mg via INTRAVENOUS

## 2021-10-31 MED ORDER — GABAPENTIN 100 MG PO CAPS
100.0000 mg | ORAL_CAPSULE | Freq: Every day | ORAL | Status: DC
  Administered 2021-11-02 – 2021-11-10 (×9): 100 mg via ORAL
  Filled 2021-10-31 (×9): qty 1

## 2021-10-31 MED ORDER — ENOXAPARIN SODIUM 30 MG/0.3ML IJ SOSY
30.0000 mg | PREFILLED_SYRINGE | INTRAMUSCULAR | Status: DC
  Administered 2021-10-31 – 2021-11-10 (×11): 30 mg via SUBCUTANEOUS
  Filled 2021-10-31 (×11): qty 0.3

## 2021-10-31 MED ORDER — DULOXETINE HCL 20 MG PO CPEP
20.0000 mg | ORAL_CAPSULE | Freq: Every day | ORAL | Status: DC
  Administered 2021-10-31 – 2021-11-11 (×9): 20 mg via ORAL
  Filled 2021-10-31 (×12): qty 1

## 2021-10-31 NOTE — Progress Notes (Signed)
   10/31/21 1928  Assess: MEWS Score  Temp 98.1 F (36.7 C)  Level of Consciousness Alert  O2 Device Nasal Cannula  O2 Flow Rate (L/min) 4 L/min  Assess: MEWS Score  MEWS Temp 0  MEWS Systolic 0  MEWS Pulse 1  MEWS RR 1  MEWS LOC 0  MEWS Score 2  MEWS Score Color Yellow  Assess: if the MEWS score is Yellow or Red  Were vital signs taken at a resting state? Yes  Focused Assessment Change from prior assessment (see assessment flowsheet)  Early Detection of Sepsis Score *See Row Information* Low  MEWS guidelines implemented *See Row Information* Yes  Treat  Pain Scale 0-10  Pain Score 0  Take Vital Signs  Increase Vital Sign Frequency  Yellow: Q 2hr X 2 then Q 4hr X 2, if remains yellow, continue Q 4hrs  Escalate  MEWS: Escalate Yellow: discuss with charge nurse/RN and consider discussing with provider and RRT  Notify: Charge Nurse/RN  Name of Charge Nurse/RN Notified Fred, RN  Date Charge Nurse/RN Notified 10/31/21  Time Charge Nurse/RN Notified 1915  Notify: Rapid Response  Name of Rapid Response RN Notified  (Informed per day shift nurse)  Document  Patient Outcome Stabilized after interventions  Progress note created (see row info) Yes

## 2021-10-31 NOTE — ED Notes (Signed)
Zoll pads placed on pt per Dr. Harvest [place]

## 2021-10-31 NOTE — Plan of Care (Signed)
  Problem: Education: Goal: Knowledge of General Education information will improve Description: Including pain rating scale, medication(s)/side effects and non-pharmacologic comfort measures Outcome: Progressing   Problem: Health Behavior/Discharge Planning: Goal: Ability to manage health-related needs will improve Outcome: Progressing   Problem: Clinical Measurements: Goal: Ability to maintain clinical measurements within normal limits will improve Outcome: Progressing   Problem: Clinical Measurements: Goal: Diagnostic test results will improve Outcome: Progressing   Problem: Clinical Measurements: Goal: Respiratory complications will improve Outcome: Progressing   Problem: Clinical Measurements: Goal: Cardiovascular complication will be avoided Outcome: Progressing   Problem: Coping: Goal: Level of anxiety will decrease Outcome: Progressing   Problem: Skin Integrity: Goal: Risk for impaired skin integrity will decrease Outcome: Progressing   Problem: Safety: Goal: Ability to remain free from injury will improve Outcome: Progressing   Problem: Cardiac: Goal: Ability to achieve and maintain adequate cardiopulmonary perfusion will improve Outcome: Progressing

## 2021-10-31 NOTE — ED Provider Notes (Signed)
[facility] EMERGENCY DEPARTMENT Provider Note   CSN: [id number] Arrival date & time: 10/31/21  0849     History Chief Complaint  Patient presents with   Shortness of Breath   Bradycardia   Dysuria    Deborah Jordan is a 87 y.o. female with PMH hyponatremia, HTN, left anterior fascicular block with known issues with bradycardia, recent admission and discharged on 10/04/2021 for septic shock from E. coli and strep bacteremia who presents emergency department for evaluation of shortness of breath.  Patient states that she awoke this morning with significant shortness of breath.  Denies chest pain, diaphoresis, nausea or vomiting.  She endorses bilateral lower extremity edema and difficulty with urination.  She states that she has to strain to urinate and it is painful when the urine comes out.  She has had issues with acute urinary retention in the past.  Currently denies fever, headache, diarrhea or other systemic symptoms.  She states that she is currently not on a diuretic.  She also complains of upper extremity tremors that started over the last 72 hours.   Shortness of Breath Associated symptoms: no abdominal pain, no chest pain, no cough, no ear pain, no fever, no rash, no sore throat and no vomiting   Dysuria Associated symptoms: no abdominal pain, no fever and no vomiting       Past Medical History:  Diagnosis Date   Anxiety    Arthritis    osteoarthritis. spinal stenosis. Scoliosis of spine-degenerative spine.   Bilateral cataracts    CAD (coronary artery disease)    minimal, improved on right 06/2016   Chronic kidney disease    STAGE 4   DDD (degenerative disc disease), lumbar    Dyspnea    Elevated cholesterol    GERD (gastroesophageal reflux disease)    controls with Nexium   Grade I diastolic dysfunction 81/44/8185   Noted on ECHO   History of cardiomegaly    History of gallstones    Hypertension    LVH (left ventricular hypertrophy)  05/04/2017   Mil, noted on ECHO   Mild depression    Pre-diabetes    RBBB (right bundle branch block)    Spinal stenosis    Tubular adenoma    and benign polyps   Vitamin D deficiency    Wears partial dentures     Patient Active Problem List   Diagnosis Date Noted   CHF (congestive heart failure) ([place]) 10/31/2021   Symptomatic bradycardia 10/31/2021   Dysuria 10/31/2021   Normocytic anemia 10/31/2021   Bacteremia    Septic shock ([place]) 09/22/2021   AKI (acute kidney injury) ([place])    Hypokalemia    Transaminitis    Dyspnea and respiratory abnormalities    Chronic bilateral low back pain with bilateral sciatica 09/27/2017   Bilateral chronic knee pain 03/13/2017   Pleural effusion 03/10/2013   Hyponatremia 03/10/2013   Hypertension 03/10/2013   Degenerative arthritis of hip 03/08/2013    Past Surgical History:  Procedure Laterality Date   CATARACT EXTRACTION Bilateral 03/06/2013   CHOLECYSTECTOMY     COLONOSCOPY     CRYOTHERAPY     DILATION AND CURETTAGE OF UTERUS     esi     HEMORRHOID SURGERY N/A 09/21/2018   Procedure: SINGLE COLUMN HEMORRHOIDECTOMY, HEMORRHOIDPEXY;  Surgeon: Leighton Ruff, MD;  Location: [facility];  Service: General;  Laterality: N/A;   TOTAL HIP ARTHROPLASTY Left 03/08/2013   Procedure: LEFT TOTAL HIP ARTHROPLASTY ANTERIOR APPROACH;  Surgeon: Mcarthur Rossetti, MD;  Location: [facility];  Service: Orthopedics;  Laterality: Left;     OB History   No obstetric history on file.     Family History  Problem Relation Age of Onset   Heart disease Father    Heart failure Father    Diabetes Father    Atrial fibrillation Sister     Social History   Tobacco Use   Smoking status: Never   Smokeless tobacco: Never  Vaping Use   Vaping Use: Never used  Substance Use Topics   Alcohol use: Yes    Comment: OCC   Drug use: Never    Home Medications Prior to Admission medications   Medication Sig Start Date End Date Taking?  Authorizing Provider  acetaminophen (TYLENOL) 650 MG CR tablet Take 650 mg by mouth every 8 (eight) hours as needed for pain.   Yes [provider]  ALPRAZolam (XANAX) 0.25 MG tablet Take 1 tablet (0.25 mg total) by mouth 3 (three) times daily as needed for anxiety. 10/04/21  Yes Pahwani, Einar Grad, MD  amLODipine (NORVASC) 10 MG tablet Take 0.5 tablets (5 mg total) by mouth daily. 10/29/21  Yes Adrian Prows, MD  aspirin EC 81 MG tablet Take 81 mg by mouth daily. Swallow whole.   Yes [provider]  atorvastatin (LIPITOR) 20 MG tablet Take 1 tablet (20 mg total) by mouth daily. 10/04/21 11/03/21 Yes Pahwani, Einar Grad, MD  Cholecalciferol (VITAMIN D) 2000 units CAPS Take 2,000 Units by mouth daily.   Yes [provider]  DULoxetine (CYMBALTA) 20 MG capsule Take 1 capsule (20 mg total) by mouth daily. 06/03/21  Yes Jessy Oto, MD  esomeprazole (NEXIUM) 40 MG capsule Take 40 mg by mouth daily.   Yes [provider]  gabapentin (NEURONTIN) 100 MG capsule Take 1 capsule (100 mg total) by mouth at bedtime. 06/03/21  Yes Jessy Oto, MD  sodium chloride 1 g tablet Take 1 g by mouth 2 (two) times daily with a meal.   Yes [provider]  sulfamethoxazole-trimethoprim (BACTRIM DS) 800-160 MG tablet Take 1 tablet by mouth 2 (two) times daily. 10/29/21  Yes [provider]  tamsulosin (FLOMAX) 0.4 MG CAPS capsule Take 1 capsule (0.4 mg total) by mouth daily after supper. 10/04/21 11/03/21 Yes Pahwani, Einar Grad, MD  trimethoprim (TRIMPEX) 100 MG tablet Take 100 mg by mouth daily.   Yes [provider]  loratadine (CLARITIN) 10 MG tablet Take 10 mg by mouth daily.    [provider]    Allergies    Ace inhibitors and Hydrochlorothiazide  Review of Systems   Review of Systems  Constitutional:  Negative for chills and fever.  HENT:  Negative for ear pain and sore throat.   Eyes:  Negative for pain and visual disturbance.  Respiratory:  Positive  for shortness of breath. Negative for cough.   Cardiovascular:  Positive for leg swelling. Negative for chest pain and palpitations.  Gastrointestinal:  Negative for abdominal pain and vomiting.  Genitourinary:  Positive for dysuria. Negative for hematuria.  Musculoskeletal:  Negative for arthralgias and back pain.  Skin:  Negative for color change and rash.  Neurological:  Positive for tremors. Negative for seizures and syncope.  All other systems reviewed and are negative.  Physical Exam Updated Vital Signs BP (!) 153/47   Pulse (!) 45   Temp 98.1 F (36.7 C) (Oral)   Resp 18   Ht 4\' 9"  (1.448 m)   Wt  83.9 kg   SpO2 93%   BMI 40.03 kg/m   Physical Exam Vitals and nursing note reviewed.  Constitutional:      General: She is not in acute distress.    Appearance: She is well-developed.  HENT:     Head: Normocephalic and atraumatic.  Eyes:     Conjunctiva/sclera: Conjunctivae normal.  Cardiovascular:     Rate and Rhythm: Normal rate and regular rhythm.     Heart sounds: No murmur heard. Pulmonary:     Effort: Pulmonary effort is normal. No respiratory distress.     Breath sounds: Normal breath sounds.  Abdominal:     Palpations: Abdomen is soft.     Tenderness: There is abdominal tenderness (suprapubic).  Musculoskeletal:     Cervical back: Neck supple.     Right lower leg: Edema present.     Left lower leg: Edema present.  Skin:    General: Skin is warm and dry.  Neurological:     Mental Status: She is alert.    ED Results / Procedures / Treatments   Labs (all labs ordered are listed, but only abnormal results are displayed) Labs Reviewed  COMPREHENSIVE METABOLIC PANEL - Abnormal; Notable for the following components:      Result Value   Sodium 124 (*)    Chloride 91 (*)    CO2 20 (*)    Glucose, Bld 119 (*)    Creatinine, Ser 1.66 (*)    GFR, Estimated 30 (*)    All other components within normal limits  BRAIN NATRIURETIC PEPTIDE - Abnormal; Notable for  the following components:   B Natriuretic Peptide 525.0 (*)    All other components within normal limits  CBC WITH DIFFERENTIAL/PLATELET - Abnormal; Notable for the following components:   RBC 3.37 (*)    Hemoglobin 9.8 (*)    HCT 29.0 (*)    All other components within normal limits  MAGNESIUM - Abnormal; Notable for the following components:   Magnesium 1.3 (*)    All other components within normal limits  BASIC METABOLIC PANEL - Abnormal; Notable for the following components:   Sodium 127 (*)    Chloride 96 (*)    CO2 19 (*)    Glucose, Bld 120 (*)    Creatinine, Ser 1.59 (*)    GFR, Estimated 31 (*)    All other components within normal limits  TROPONIN I (HIGH SENSITIVITY) - Abnormal; Notable for the following components:   Troponin I (High Sensitivity) 21 (*)    All other components within normal limits  TROPONIN I (HIGH SENSITIVITY) - Abnormal; Notable for the following components:   Troponin I (High Sensitivity) 20 (*)    All other components within normal limits  LACTIC ACID, PLASMA  LACTIC ACID, PLASMA  PROTIME-INR  AMMONIA  TSH  URINALYSIS, ROUTINE W REFLEX MICROSCOPIC    EKG None  Radiology DG Chest Portable 1 View  Result Date: 10/31/2021 CLINICAL DATA:  Shortness of breath.  Assess for pulmonary edema. EXAM: PORTABLE CHEST 1 VIEW COMPARISON:  September 24, 2021 FINDINGS: The heart size is enlarged. Mediastinal contour is normal. There is increased pulmonary interstitium bilaterally. Probable small left pleural effusion is identified. There is no focal pneumonia. No acute abnormality is identified in the osseous structures. IMPRESSION: Congestive heart failure. Electronically Signed   By: Abelardo Diesel M.D.   On: 10/31/2021 09:22    Procedures .Critical Care Performed by: Teressa Lower, MD Authorized by: Teressa Lower, MD   Critical  care provider statement:    Critical care time (minutes):  30   Critical care was necessary to treat or prevent imminent or  life-threatening deterioration of the following conditions:  Respiratory failure   Critical care was time spent personally by me on the following activities:  Development of treatment plan with patient or surrogate, discussions with consultants, evaluation of patient's response to treatment, examination of patient, ordering and review of laboratory studies, ordering and review of radiographic studies, ordering and performing treatments and interventions, pulse oximetry, re-evaluation of patient's condition and review of old charts   Medications Ordered in ED Medications  aspirin EC tablet 81 mg (has no administration in time range)  ALPRAZolam (XANAX) tablet 0.25 mg (has no administration in time range)  sodium chloride tablet 1 g (has no administration in time range)  sodium chloride flush (NS) 0.9 % injection 3 mL (has no administration in time range)  sodium chloride flush (NS) 0.9 % injection 3 mL (has no administration in time range)  0.9 %  sodium chloride infusion (has no administration in time range)  acetaminophen (TYLENOL) tablet 650 mg (has no administration in time range)  ondansetron (ZOFRAN) injection 4 mg (has no administration in time range)  furosemide (LASIX) injection 40 mg (has no administration in time range)  enoxaparin (LOVENOX) injection 30 mg (has no administration in time range)  potassium chloride SA (KLOR-CON) CR tablet 40 mEq (has no administration in time range)  magnesium sulfate IVPB 2 g 50 mL (2 g Intravenous New Bag/Given 10/31/21 1559)  DULoxetine (CYMBALTA) DR capsule 20 mg (has no administration in time range)  gabapentin (NEURONTIN) capsule 100 mg (has no administration in time range)  tamsulosin (FLOMAX) capsule 0.4 mg (has no administration in time range)  trimethoprim (TRIMPEX) tablet 100 mg (has no administration in time range)  furosemide (LASIX) injection 40 mg (40 mg Intravenous Given 10/31/21 1236)    ED Course  I have reviewed the triage vital  signs and the nursing notes.  Pertinent labs & imaging results that were available during my care of the patient were reviewed by me and considered in my medical decision making (see chart for details).    MDM Rules/Calculators/A&P                           Patient seen in the emergency department for evaluation of shortness of breath.  Physical exam reveals bilateral lower extremity edema and an irregular bradycardia.  Patient placed on 2 L nasal cannula for hypoxia.  Laboratory evaluation with hyponatremia to 124, hypochloremia to 91, creatinine elevation 1.66, hemoglobin 9.8, BNP elevated to 525, high-sensitivity troponin 20.  Chest x-ray with pulmonary edema.  Patient given 40 mg Lasix.  ECG with a fascicular block and likely heart block.  Suspect this is playing into the patient's CHF exacerbation.  Patient was admitted to medicine who had a discussion with cardiology.  Cardiology will consult on the patient and medicine will primarily admit.  Patient then admitted. Final Clinical Impression(s) / ED Diagnoses Final diagnoses:  None    Rx / DC Orders ED Discharge Orders     None        Henri Baumler, Debe Coder, MD 10/31/21 [id number]

## 2021-10-31 NOTE — H&P (Addendum)
History and Physical    Deborah Jordan DDU:[id number] DOB: 01-30-1933 DOA: 10/31/2021  Referring MD/NP/PA: Teressa Lower, MD PCP: Deborah Jordan, MD  Patient coming from: Home lives with daughter via EMS  Chief Complaint: Shortness of breath, slow heart rate, and difficulty peeing  I have personally briefly reviewed patient's old medical records in [place]   HPI: Deborah Jordan is a 87 y.o. female with medical history significant of hypertension, HLD, diastolic CHF, CAD, hyponatremia, prediabetes, and obesity who presents with complaints of shortness of breath, slow heart rates, and difficulty peeing.  She just recently been hospitalized 10/5 -10/17 with septic shock secondary to urosepsis.  Over the last week patient has not felt well.  She has a home health that had recently notified her that her heart rates were as low as 33 earlier in the week.  She followed up with her primary care provider 3 days ago and during that appointment was noted to have heart rates as low as 47 for which Coreg was discontinued.  She had a follow-up appointment with her cardiologist the following day and at that time amlodipine was decreased from 10 mg daily to 5 mg.  Records note patient was found to have a bifascicular block, but appear to be asymptomatic which pacemaker was not thought to be warranted at that time.  Over the last few days patient reports that she has not been urinating although she feels the urge to.  Notes associated symptoms of leg swelling and some nausea.  This morning she complained of feeling more short of breath and was unable to rest.  Denies having any significant fever, cough, abdominal pain, vomiting, or blood in stools.  Patient does note that she does have hemorrhoids.  In route with EMS patient was noted to have heart rates into the 23J with systolic blood pressures initially noted to be around 60s.  Patient was placed on 4 L nasal cannula oxygen and transferred to the  emergency department.  ED Course: On admission into the emergency department patient was noted to have heart rates 36-55, respirations 16-24, blood pressures 119/98-160 4/45, and O2 saturations currently maintained on 3 L of nasal cannula oxygen.  She was placed on oxygen more so for comfort and was not noted to be hypoxic.  Labs significant for hemoglobin 9.8, sodium 124, potassium 3.6, BUN 17, creatinine 1.66, BNP 525, troponin 21, and lactic acid 0.9.  Chest x-ray noted  Concern for congestive heart failure exacerbation.  Patient was given Lasix 40 mg IV x1 dose.  TRH called to admit   Review of Systems  Constitutional:  Positive for malaise/fatigue. Negative for fever.  HENT:  Negative for congestion and nosebleeds.   Eyes:  Negative for photophobia and pain.  Respiratory:  Positive for shortness of breath. Negative for cough.   Cardiovascular:  Positive for orthopnea and leg swelling. Negative for chest pain.  Gastrointestinal:  Positive for nausea. Negative for abdominal pain and vomiting.  Genitourinary:  Positive for urgency. Negative for hematuria.  Musculoskeletal:  Negative for falls.  Skin:  Negative for rash.  Neurological:  Positive for weakness. Negative for loss of consciousness.  Psychiatric/Behavioral:  Negative for substance abuse.    Past Medical History:  Diagnosis Date   Anxiety    Arthritis    osteoarthritis. spinal stenosis. Scoliosis of spine-degenerative spine.   Bilateral cataracts    CAD (coronary artery disease)    minimal, improved on right 06/2016   Chronic kidney disease  STAGE 4   DDD (degenerative disc disease), lumbar    Dyspnea    Elevated cholesterol    GERD (gastroesophageal reflux disease)    controls with Nexium   Grade I diastolic dysfunction 65/99/3570   Noted on ECHO   History of cardiomegaly    History of gallstones    Hypertension    LVH (left ventricular hypertrophy) 05/04/2017   Mil, noted on ECHO   Mild depression     Pre-diabetes    RBBB (right bundle branch block)    Spinal stenosis    Tubular adenoma    and benign polyps   Vitamin D deficiency    Wears partial dentures     Past Surgical History:  Procedure Laterality Date   CATARACT EXTRACTION Bilateral 03/06/2013   CHOLECYSTECTOMY     COLONOSCOPY     CRYOTHERAPY     DILATION AND CURETTAGE OF UTERUS     esi     HEMORRHOID SURGERY N/A 09/21/2018   Procedure: SINGLE COLUMN HEMORRHOIDECTOMY, HEMORRHOIDPEXY;  Surgeon: Leighton Ruff, MD;  Location: [facility];  Service: General;  Laterality: N/A;   TOTAL HIP ARTHROPLASTY Left 03/08/2013   Procedure: LEFT TOTAL HIP ARTHROPLASTY ANTERIOR APPROACH;  Surgeon: Mcarthur Rossetti, MD;  Location: [facility];  Service: Orthopedics;  Laterality: Left;     reports that she has never smoked. She has never used smokeless tobacco. She reports current alcohol use. She reports that she does not use drugs.  Allergies  Allergen Reactions   Ace Inhibitors Cough   Hydrochlorothiazide Other (See Comments)    Low Sodium    Family History  Problem Relation Age of Onset   Heart disease Father    Heart failure Father    Diabetes Father    Atrial fibrillation Sister     Prior to Admission medications   Medication Sig Start Date End Date Taking? Authorizing Provider  acetaminophen (TYLENOL) 650 MG CR tablet Take 650 mg by mouth every 8 (eight) hours as needed for pain.   Yes [provider]  ALPRAZolam (XANAX) 0.25 MG tablet Take 1 tablet (0.25 mg total) by mouth 3 (three) times daily as needed for anxiety. 10/04/21  Yes Pahwani, Einar Grad, MD  amLODipine (NORVASC) 10 MG tablet Take 0.5 tablets (5 mg total) by mouth daily. 10/29/21  Yes Adrian Prows, MD  aspirin EC 81 MG tablet Take 81 mg by mouth daily. Swallow whole.   Yes [provider]  atorvastatin (LIPITOR) 20 MG tablet Take 1 tablet (20 mg total) by mouth daily. 10/04/21 11/03/21 Yes Pahwani, Einar Grad, MD  carvedilol (COREG) 12.5  MG tablet Take 12.5 mg by mouth 2 (two) times daily with a meal.   Yes [provider]  Cholecalciferol (VITAMIN D) 2000 units CAPS Take 2,000 Units by mouth daily.   Yes [provider]  DULoxetine (CYMBALTA) 20 MG capsule Take 1 capsule (20 mg total) by mouth daily. 06/03/21  Yes Jessy Oto, MD  esomeprazole (NEXIUM) 40 MG capsule Take 40 mg by mouth daily.   Yes [provider]  gabapentin (NEURONTIN) 100 MG capsule Take 1 capsule (100 mg total) by mouth at bedtime. 06/03/21  Yes Jessy Oto, MD  sodium chloride 1 g tablet Take 1 g by mouth 2 (two) times daily with a meal.   Yes [provider]  sulfamethoxazole-trimethoprim (BACTRIM DS) 800-160 MG tablet Take 1 tablet by mouth 2 (two) times daily. 10/29/21  Yes [provider]  tamsulosin (FLOMAX) 0.4 MG  CAPS capsule Take 1 capsule (0.4 mg total) by mouth daily after supper. 10/04/21 11/03/21 Yes Pahwani, Einar Grad, MD  trimethoprim (TRIMPEX) 100 MG tablet Take 100 mg by mouth daily.   Yes [provider]  loratadine (CLARITIN) 10 MG tablet Take 10 mg by mouth daily.    [provider]    Physical Exam:  Constitutional: Elderly female who appears to be ill Vitals:   10/31/21 0901 10/31/21 0915 10/31/21 0930 10/31/21 0945  BP:  (!) 164/45 (!) 119/98 (!) 147/53  Pulse:  (!) 36 (!) 49 (!) 45  Resp:  (!) 24 20 (!) 21  Temp:      TempSrc:      SpO2:  95% 100% 98%  Weight: 78.5 kg     Height: 4\' 9"  (1.448 m)      Eyes: PERRL, lids and conjunctivae normal ENMT: Mucous membranes are moist. Posterior pharynx clear of any exudate or lesions.  Neck: normal, supple, no masses, no thyromegaly.  JVD present Respiratory: Intermittent crackles appreciated.  Currently O2 saturation maintained on 3 L nasal cannula oxygen.  Patient able to talk in complete sentences. Cardiovascular: Bradycardia, no murmurs / rubs / gallops. No extremity edema. 2+ pedal pulses. No carotid bruits.  Abdomen:  no tenderness, no masses palpated. No hepatosplenomegaly. Bowel sounds positive.  Musculoskeletal: no clubbing / cyanosis. No joint deformity upper and lower extremities. Good ROM, no contractures. Normal muscle tone.  Skin: no rashes, lesions, ulcers. No induration Neurologic: CN 2-12 grossly intact. Strength 5/5 in all 4.  Psychiatric: Memory impairment. alert and oriented x 3. Normal mood.     Labs on Admission: I have personally reviewed following labs and imaging studies  CBC: Recent Labs  Lab 10/31/21 0919  WBC 9.7  NEUTROABS 7.6  HGB 9.8*  HCT 29.0*  MCV 86.1  PLT 846   Basic Metabolic Panel: Recent Labs  Lab 10/31/21 0919  NA 124*  K 3.6  CL 91*  CO2 20*  GLUCOSE 119*  BUN 17  CREATININE 1.66*  CALCIUM 9.1   GFR: Estimated Creatinine Clearance: 21 mL/min (A) (by C-G formula based on SCr of 1.66 mg/dL (H)). Liver Function Tests: Recent Labs  Lab 10/31/21 0919  AST 20  ALT 13  ALKPHOS 67  BILITOT 0.7  PROT 6.5  ALBUMIN 3.7   No results for input(s): LIPASE, AMYLASE in the last 168 hours. Recent Labs  Lab 10/31/21 0919  AMMONIA 10   Coagulation Profile: Recent Labs  Lab 10/31/21 0919  INR 1.1   Cardiac Enzymes: No results for input(s): CKTOTAL, CKMB, CKMBINDEX, TROPONINI in the last 168 hours. BNP (last 3 results) No results for input(s): PROBNP in the last 8760 hours. HbA1C: No results for input(s): HGBA1C in the last 72 hours. CBG: No results for input(s): GLUCAP in the last 168 hours. Lipid Profile: No results for input(s): CHOL, HDL, LDLCALC, TRIG, CHOLHDL, LDLDIRECT in the last 72 hours. Thyroid Function Tests: No results for input(s): TSH, T4TOTAL, FREET4, T3FREE, THYROIDAB in the last 72 hours. Anemia Panel: No results for input(s): VITAMINB12, FOLATE, FERRITIN, TIBC, IRON, RETICCTPCT in the last 72 hours. Urine analysis:    Component Value Date/Time   COLORURINE AMBER (A) 09/22/2021 0923   APPEARANCEUR CLEAR 09/22/2021 0923    LABSPEC 1.035 (H) 09/22/2021 0923   PHURINE 6.0 09/22/2021 0923   GLUCOSEU NEGATIVE 09/22/2021 0923   HGBUR NEGATIVE 09/22/2021 0923   BILIRUBINUR NEGATIVE 09/22/2021 0923   KETONESUR NEGATIVE 09/22/2021 0923   PROTEINUR 30 (A)  09/22/2021 0923   UROBILINOGEN 0.2 03/08/2013 0916   NITRITE NEGATIVE 09/22/2021 0923   LEUKOCYTESUR NEGATIVE 09/22/2021 0923   Sepsis Labs: No results found for this or any previous visit (from the past 240 hour(s)).   Radiological Exams on Admission: DG Chest Portable 1 View  Result Date: 10/31/2021 CLINICAL DATA:  Shortness of breath.  Assess for pulmonary edema. EXAM: PORTABLE CHEST 1 VIEW COMPARISON:  September 24, 2021 FINDINGS: The heart size is enlarged. Mediastinal contour is normal. There is increased pulmonary interstitium bilaterally. Probable small left pleural effusion is identified. There is no focal pneumonia. No acute abnormality is identified in the osseous structures. IMPRESSION: Congestive heart failure. Electronically Signed   By: Abelardo Diesel M.D.   On: 10/31/2021 09:22    EKG: Independently reviewed.  Sinus bradycardia 48 bpm   Assessment/Plan Diastolic congestive heart failure exacerbation: Patient presents with complaints of shortness of breath although O2 saturation maintained on room air.  Patient noted to have elevated BNP of 525 with signs of hypervolemic hyponatremia.  Suspect symptoms likely related with bradycardia.  Last echocardiogram revealed EF of 55 - 60% -Admit to a progressive bed due to heart rates -CHF order set -Strict I&O's and daily weight -Lasix 40 mg IV twice daily -No beta-blocker due to bradycardia and ACE-I/ARB due to renal insufficiency -[facility] consulted  Symptomatic bradycardia: On admission heart rates reported to be anywhere from 36-46 with bifascicular block on EKG.  Blood pressure her currently maintained.    Patient had just been seen by her cardiologist Dr. Einar Gip on 11/11 after patient had been  discontinued off of carvedilol due to heart rate being as low as 47 on 11/10 at PCP.  Patient was advised to decrease amlodipine to 5 mg daily during that appointment but was felt not to need pacemaker at that time. -Avoid AV nodal blocking agents -Apply temporary pacing pads to chest in case of need of pacing -Check TSH -Check mag  Hyponatremia: Acute on chronic.  Sodium noted to be 124 on admission.  Suspect secondary to patient being volume overloaded.  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 -Continue to monitor sodium levels with diuresis -Continue sodium tablets  Acute kidney injury: Patient presents with creatinine elevated up to 1.66 with BUN 17.  At this time unclear if symptoms secondary to hypoperfusion related to congestive heart failure versus Bactrim. -Avoid nephrotoxic agents -Hold Bactrim -Follow-up urinalysis -Will start trimethoprim prophylaxis  Dysuria: Patient had just been hospitalized for urosepsis during last hospitalization.  Currently complained of some dysuria.  However WBC within normal limits, patient afebrile, and -Follow-up UA  Essential hypertension: Blood pressures initially elevated up to 164/45.  Home blood pressure medications include amlodipine 5 mg daily. -Held amlodipine in case of worsening blood pressure with diuresis  Elevated troponin: High-sensitivity troponin mildly elevated at 21 on admission.  -Continue to monitor  Normocytic anemia: Hemoglobin 9.8 which appears similar to previous. -Continue to monitor  Anxiety -Continue Xanax as needed   DVT prophylaxis: Lovenox Code Status: Full Family Communication: Daughter to be updated over the phone Disposition Plan: To be determined Consults called: Cardiology Admission status: Inpatient, require more than 2 midnight stay due to need of IV diuresis and possible need of  procedure for bradycardia  Norval Morton MD [facility]   If 7PM-7AM, please contact night-coverage   10/31/2021, 12:06 PM

## 2021-10-31 NOTE — ED Triage Notes (Signed)
Pt arrives via [facility] from home for evaluation of SOB, bradycardia, and dysuria. Pt states that last night she started with increasing dyspnea. Pt also notes increased swelling to BLE. Per EMS fire dept noted pt initially to be bradycardic with HR 30's and SBP 60. Pt placed on 4 lpm via [place]. Pt also c/o dysuria, recently diagnosed with UTI and had catheter placed for urinary retention. Pt A+Ox4  EMS last VS - HR 48, 162/70, 96% on 4 lpm via [place], RR 18

## 2021-10-31 NOTE — Consult Note (Signed)
CARDIOLOGY CONSULT NOTE  Patient ID: Deborah Jordan MRN: [id number] DOB/AGE: 1933-03-02 87 y.o.  Admit date: 10/31/2021 Attending physician: Norval Morton, MD Primary Physician:  [name], MD Outpatient Cardiologist: Dr. Adrian Prows Inpatient Cardiologist: Rex Kras, DO, [facility]  Reason of consultation: Bradycardia and heart failure Referring physician: Norval Morton, MD  Chief complaint: Shortness of breath and slow heart rate  HPI:  Deborah Jordan is a 87 y.o. Caucasian female who presents with a chief complaint of " shortness of breath and dysuria." Her past medical history and cardiovascular risk factors include: Hypertension with chronic kidney disease, hyperlipidemia, bifascicular block, hyponatremia, history of urosepsis, advanced age, postmenopausal female.  Patient presents to the ED via EMS for symptoms of shortness of breath, slow heart rate, and burning with urination.  Per EMR first responders noted bradycardia with heart rates in the 30s.  Patient was placed on 4 L nasal cannula and transferred [name] for further evaluation and management.  Cardiology has been consulted for congestive heart failure management and bradycardia.  Patient sees my partner Dr. Einar Gip and on the last visit 10/29/2021 reemphasized holding beta-blockers and dose of amlodipine reduced to 5 mg p.o. daily.    Patient is a poor historian therefore history of present illness was obtained by calling her daughter Deborah Jordan over the phone.  Daughter states that she recently was diagnosed with another urinary tract infection by urology and started on Bactrim and she is also been on sodium tablets given her chronic hyponatremia.  Patient denies any chest pain at rest or with effort related activities.  However recently over the weekend has noticed shortness of breath usually at rest and is currently on 2 L nasal cannula oxygen at the time of evaluation.  She denies orthopnea, paroxysmal nocturnal  dyspnea.  Lower extremity swelling has improved with IV diuretics.  Patient's daughter states that she has been off of beta-blocker therapy for at least 1 week.  No use of eyedrops.  ALLERGIES: Allergies  Allergen Reactions   Ace Inhibitors Cough   Hydrochlorothiazide Other (See Comments)    Low Sodium    PAST MEDICAL HISTORY: Past Medical History:  Diagnosis Date   Anxiety    Arthritis    osteoarthritis. spinal stenosis. Scoliosis of spine-degenerative spine.   Bilateral cataracts    CAD (coronary artery disease)    minimal, improved on right 06/2016   Chronic kidney disease    STAGE 4   DDD (degenerative disc disease), lumbar    Dyspnea    Elevated cholesterol    GERD (gastroesophageal reflux disease)    controls with Nexium   Grade I diastolic dysfunction 00/37/0488   Noted on ECHO   History of cardiomegaly    History of gallstones    Hypertension    LVH (left ventricular hypertrophy) 05/04/2017   Mil, noted on ECHO   Mild depression    Pre-diabetes    RBBB (right bundle branch block)    Spinal stenosis    Tubular adenoma    and benign polyps   Vitamin D deficiency    Wears partial dentures     PAST SURGICAL HISTORY: Past Surgical History:  Procedure Laterality Date   CATARACT EXTRACTION Bilateral 03/06/2013   CHOLECYSTECTOMY     COLONOSCOPY     CRYOTHERAPY     DILATION AND CURETTAGE OF UTERUS     esi     HEMORRHOID SURGERY N/A 09/21/2018   Procedure: SINGLE COLUMN HEMORRHOIDECTOMY, HEMORRHOIDPEXY;  Surgeon: Marcello Moores,  Elmo Putt, MD;  Location: [facility];  Service: General;  Laterality: N/A;   TOTAL HIP ARTHROPLASTY Left 03/08/2013   Procedure: LEFT TOTAL HIP ARTHROPLASTY ANTERIOR APPROACH;  Surgeon: Mcarthur Rossetti, MD;  Location: [facility];  Service: Orthopedics;  Laterality: Left;    FAMILY HISTORY: The patient's family history includes Atrial fibrillation in her sister; Diabetes in her father; Heart disease in her father; Heart failure  in her father.   SOCIAL HISTORY:  The patient  reports that she has never smoked. She has never used smokeless tobacco. She reports current alcohol use. She reports that she does not use drugs.  MEDICATIONS: Current Outpatient Medications  Medication Instructions   acetaminophen (TYLENOL) 650 mg, Oral, Every 8 hours PRN   ALPRAZolam (XANAX) 0.25 mg, Oral, 3 times daily PRN   amLODipine (NORVASC) 5 mg, Oral, Daily   aspirin EC 81 mg, Oral, Daily, Swallow whole.   atorvastatin (LIPITOR) 20 mg, Oral, Daily   DULoxetine (CYMBALTA) 20 mg, Oral, Daily   esomeprazole (NEXIUM) 40 mg, Oral, Daily   gabapentin (NEURONTIN) 100 mg, Oral, Daily at bedtime   loratadine (CLARITIN) 10 mg, Oral, Daily   sodium chloride 1 g, Oral, 2 times daily with meals   sulfamethoxazole-trimethoprim (BACTRIM DS) 800-160 MG tablet 1 tablet, Oral, 2 times daily   tamsulosin (FLOMAX) 0.4 mg, Oral, Daily after supper   trimethoprim (TRIMPEX) 100 mg, Oral, Daily   Vitamin D 2,000 Units, Oral, Daily    REVIEW OF SYSTEMS: Review of Systems  Constitutional: Negative for chills and fever.  HENT:  Negative for hoarse voice and nosebleeds.   Eyes:  Negative for discharge, double vision and pain.  Cardiovascular:  Positive for leg swelling. Negative for chest pain, claudication, dyspnea on exertion, near-syncope, orthopnea, palpitations, paroxysmal nocturnal dyspnea and syncope.  Respiratory:  Positive for shortness of breath. Negative for hemoptysis.   Musculoskeletal:  Negative for muscle cramps and myalgias.  Gastrointestinal:  Positive for nausea. Negative for abdominal pain, constipation, diarrhea, hematemesis, hematochezia, melena and vomiting.  Genitourinary:  Positive for dysuria.       Hesitancy versus urgency  Neurological:  Positive for light-headedness. Negative for dizziness.  All other systems reviewed and are negative.  PHYSICAL EXAM: Vitals with BMI 10/31/2021 10/31/2021 10/31/2021  Height - - -   Weight - - -  BMI - - -  Systolic 456 256 389  Diastolic 373 90 56  Pulse 55 43 48    No intake or output data in the 24 hours ending 10/31/21 1300  Net IO Since Admission: No IO data has been entered for this period [10/31/21 1300]  CONSTITUTIONAL: Appears older than stated age, hemodynamically stable, no acute distress.  SKIN: Skin is warm and dry. No rash noted. No cyanosis. No pallor. No jaundice HEAD: Normocephalic and atraumatic.  Nasal cannula oxygen. EYES: No scleral icterus MOUTH/THROAT: Moist oral membranes.  NECK: No JVD present. No thyromegaly noted. LYMPHATIC: No visible cervical adenopathy.  CHEST Normal respiratory effort. No intercostal retractions  LUNGS: Bilateral rales prominent at the bases. CARDIOVASCULAR: Bradycardic, positive S2-A7, holosystolic murmur heard at the apex, no gallops or rubs. ABDOMINAL: Soft, nontender, nondistended, positive bowel sounds in all 4 quadrants, no apparent ascites.  EXTREMITIES: Trace pitting edema, warm to touch.  HEMATOLOGIC: No significant bruising NEUROLOGIC: Oriented to person, place, and time. Nonfocal. Normal muscle tone.  PSYCHIATRIC: Normal mood and affect. Normal behavior. Cooperative  RADIOLOGY: DG Chest Portable 1 View  Result Date: 10/31/2021 CLINICAL DATA:  Shortness  of breath.  Assess for pulmonary edema. EXAM: PORTABLE CHEST 1 VIEW COMPARISON:  September 24, 2021 FINDINGS: The heart size is enlarged. Mediastinal contour is normal. There is increased pulmonary interstitium bilaterally. Probable small left pleural effusion is identified. There is no focal pneumonia. No acute abnormality is identified in the osseous structures. IMPRESSION: Congestive heart failure. Electronically Signed   By: Abelardo Diesel M.D.   On: 10/31/2021 09:22    LABORATORY DATA: Lab Results  Component Value Date   WBC 9.7 10/31/2021   HGB 9.8 (L) 10/31/2021   HCT 29.0 (L) 10/31/2021   MCV 86.1 10/31/2021   PLT 274 10/31/2021    Recent  Labs  Lab 10/31/21 0919  NA 124*  K 3.6  CL 91*  CO2 20*  BUN 17  CREATININE 1.66*  CALCIUM 9.1  PROT 6.5  BILITOT 0.7  ALKPHOS 67  ALT 13  AST 20  GLUCOSE 119*    Lipid Panel  No results found for: CHOL, HDL, LDLCALC, LDLDIRECT, TRIG, CHOLHDL  BNP (last 3 results) Recent Labs    06/19/21 0319 10/31/21 0919  BNP 163.9* 525.0*    HEMOGLOBIN A1C No results found for: HGBA1C, MPG  Cardiac Panel (last 3 results) Recent Labs    10/31/21 0919  TROPONINIHS 21*     TSH Recent Labs    06/19/21 0319  TSH 1.872     CARDIAC DATABASE: EKG: 10/29/2021: Sinus bradycardia, 52 bpm, left anterior fascicular block, right bundle branch block, 2:1 AV block, cannot rule out second-degree type II.   10/31/2021: Sinus bradycardia, 48 bpm, first-degree AV block, IVCD suggestive of RBBB, LAFB, 2:1 AVB cannot rule out Type II.   Echocardiogram: 09/24/2021:    1. Left ventricular ejection fraction, by estimation, is 55 to 60%. The left ventricle has normal function. The left ventricle has no regional wall motion abnormalities.  2. Right ventricular systolic function is normal. The right ventricular size is normal.  3. Left atrial size was mildly dilated.  4. Mild mitral valve regurgitation. There is moderate holosystolic prolapse of the middle scallop of the posterior leaflet of the mitral valve.  5. The aortic valve is calcified. Aortic valve regurgitation is not visualized.  Carotid artery duplex 07/06/2021: Duplex suggests stenosis in the right internal carotid artery (1-15%). Duplex suggests stenosis in the left internal carotid artery (16-49%). Duplex suggests stenosis in the left external carotid artery (<50%). Antegrade right vertebral artery flow. Antegrade left vertebral artery flow. Follow up in one year is appropriate if clinically indicated.  IMPRESSION & RECOMMENDATIONS: HAILYN ZARR is a 87 y.o. Caucasian female whose past medical history and cardiovascular risk  factors include: Hypertension with chronic kidney disease, hyperlipidemia, bifascicular block, hyponatremia, history of urosepsis, advanced age, postmenopausal female.  Impression:  Symptomatic bradycardia Acute HFpEF, stage C, NYHA class II/III Conduction disease -underlying RBBB, LAFB, raising suspicion for second-degree type II AV block Acute kidney injury Hyponatremia-multifactorial Hypomagnesemia.  Bilateral carotid artery atherosclerosis. Normocytic, normochromic anemia   Plan:  Cardiology consulted for symptomatic bradycardia.  EKG showed sinus bradycardia with first-degree AV block, IVCD suggestive of RBBB, left anterior fascicular block, and a 2-1 AV block suggestive of second-degree AV block.  Work-up thus far has also noted hyponatremia and hypomagnesemia.  Recommend correcting electrolyte abnormalities per primary team.  Patient has been off of carvedilol for at least 1 week according to the patient's daughter.  Would recommend also holding off amlodipine.  We will start hydralazine 10 mg p.o. 3 times daily for blood  pressure greater than 170 mmHg.  Avoid over correcting the blood pressure as it she may become symptomatic given her underlying conduction disease.  Monitor carefully.  Continue telemetry.  Recommend up with assistance and fall precautions discussed.  Other causes of bradycardia include infection.  She was recently hospitalized in October for urosepsis.  And her daughter states that she was noted to have another urinary tract infection by her urologist and started on Bactrim.  Rule out infectious etiology-defer to primary team.  Patient is also noted to have acute kidney injury which needs to be corrected prior to discussing possibility of device therapy for her underlying bradycardia.  Differential diagnosis includes intravascular depletion versus post obstructive uropathy given her symptoms of hesitancy and urgency.  Defer work-up to primary team at this  time.  Patient presents with symptoms of shortness of breath, hypoxia requiring 4 L nasal cannula, lower extremity swelling, elevated BNP, and chest x-ray noting vascular congestion clinically the findings are suggestive of HFpEF.  Focus on diuresis for now.  Would avoid over correcting her blood pressures for reasons mentioned above.  Recent echo about a month ago reviewed personally which notes preserved LVEF and grade 2 diastolic dysfunction and elevated RAP. Continue diuresis and strict I&Os.   Troponins are essentially flat not suggestive of ACS.  Patient is also not reporting anginal discomfort.  We will hold off on ischemic work-up at this time.  We will also order dobutamine to be kept at bedside to use if clinically necessary.  If needed consider initiating at 2 mcg/kg/min. Spoke to her RN Deborah Jordan regarding this as well.   Will consult EP for symptomatic bradycardia to evaluate for possible pacemaker.   Total encounter time 89 minutes. *Total Encounter Time as defined by the Centers for Medicare and Medicaid Services includes, in addition to the face-to-face time of a patient visit and calling her daughter over the phone to obtain HPI (documented in the note above) non-face-to-face time: obtaining and reviewing outside history, ordering and reviewing medications, tests or procedures, care coordination (communications with other health care professionals or caregivers) and documentation in the medical record.  Patient's questions and concerns were addressed to her satisfaction. She voices understanding of the instructions provided during this encounter.   This note was created using a voice recognition software as a result there may be grammatical errors inadvertently enclosed that do not reflect the nature of this encounter. Every attempt is made to correct such errors.  [facility]  Pager: [phone] Office: [phone] 10/31/2021, 1:00 PM

## 2021-11-01 DIAGNOSIS — I442 Atrioventricular block, complete: Secondary | ICD-10-CM

## 2021-11-01 DIAGNOSIS — I5033 Acute on chronic diastolic (congestive) heart failure: Secondary | ICD-10-CM | POA: Diagnosis not present

## 2021-11-01 LAB — URINALYSIS, ROUTINE W REFLEX MICROSCOPIC
Bilirubin Urine: NEGATIVE
Glucose, UA: NEGATIVE mg/dL
Hgb urine dipstick: NEGATIVE
Ketones, ur: NEGATIVE mg/dL
Leukocytes,Ua: NEGATIVE
Nitrite: NEGATIVE
Protein, ur: 30 mg/dL — AB
Specific Gravity, Urine: 1.011 (ref 1.005–1.030)
pH: 5 (ref 5.0–8.0)

## 2021-11-01 LAB — BLOOD GAS, ARTERIAL
Acid-base deficit: 3.1 mmol/L — ABNORMAL HIGH (ref 0.0–2.0)
Bicarbonate: 20.5 mmol/L (ref 20.0–28.0)
Drawn by: 53309
FIO2: 35
O2 Saturation: 97.8 %
Patient temperature: 37.2
pCO2 arterial: 31.3 mmHg — ABNORMAL LOW (ref 32.0–48.0)
pH, Arterial: 7.432 (ref 7.350–7.450)
pO2, Arterial: 95.8 mmHg (ref 83.0–108.0)

## 2021-11-01 LAB — BASIC METABOLIC PANEL
Anion gap: 12 (ref 5–15)
BUN: 18 mg/dL (ref 8–23)
CO2: 18 mmol/L — ABNORMAL LOW (ref 22–32)
Calcium: 8.8 mg/dL — ABNORMAL LOW (ref 8.9–10.3)
Chloride: 94 mmol/L — ABNORMAL LOW (ref 98–111)
Creatinine, Ser: 1.64 mg/dL — ABNORMAL HIGH (ref 0.44–1.00)
GFR, Estimated: 30 mL/min — ABNORMAL LOW (ref >60)
Glucose, Bld: 108 mg/dL — ABNORMAL HIGH (ref 70–99)
Potassium: 4.1 mmol/L (ref 3.5–5.1)
Sodium: 124 mmol/L — ABNORMAL LOW (ref 135–145)

## 2021-11-01 LAB — MAGNESIUM: Magnesium: 1.7 mg/dL (ref 1.7–2.4)

## 2021-11-01 MED ORDER — LORAZEPAM 2 MG/ML IJ SOLN
1.0000 mg | Freq: Once | INTRAMUSCULAR | Status: AC
  Administered 2021-11-01: 1 mg via INTRAVENOUS
  Filled 2021-11-01: qty 1

## 2021-11-01 MED ORDER — CHLORHEXIDINE GLUCONATE CLOTH 2 % EX PADS
6.0000 | MEDICATED_PAD | Freq: Every day | CUTANEOUS | Status: DC
  Administered 2021-11-01 – 2021-11-08 (×7): 6 via TOPICAL

## 2021-11-01 MED ORDER — ATORVASTATIN CALCIUM 10 MG PO TABS
20.0000 mg | ORAL_TABLET | Freq: Every day | ORAL | Status: DC
  Filled 2021-11-01: qty 2

## 2021-11-01 MED ORDER — PANTOPRAZOLE SODIUM 40 MG PO TBEC
40.0000 mg | DELAYED_RELEASE_TABLET | Freq: Every day | ORAL | Status: DC
  Administered 2021-11-03 – 2021-11-11 (×8): 40 mg via ORAL
  Filled 2021-11-01 (×11): qty 1

## 2021-11-01 MED ORDER — LORAZEPAM 2 MG/ML IJ SOLN
INTRAMUSCULAR | Status: AC
  Administered 2021-11-01: 1 mg via INTRAVENOUS
  Filled 2021-11-01: qty 1

## 2021-11-01 MED ORDER — LORAZEPAM 2 MG/ML IJ SOLN
1.0000 mg | Freq: Four times a day (QID) | INTRAMUSCULAR | Status: DC | PRN
  Administered 2021-11-03: 1 mg via INTRAVENOUS
  Filled 2021-11-01: qty 1

## 2021-11-01 MED ORDER — FUROSEMIDE 10 MG/ML IJ SOLN
80.0000 mg | Freq: Two times a day (BID) | INTRAMUSCULAR | Status: DC
  Administered 2021-11-01 (×2): 80 mg via INTRAVENOUS
  Filled 2021-11-01: qty 8

## 2021-11-01 MED ORDER — MELATONIN 5 MG PO TABS: 5.0000 mg | ORAL_TABLET | Freq: Every evening | ORAL | Status: DC | PRN

## 2021-11-01 MED ORDER — MAGNESIUM SULFATE 2 GM/50ML IV SOLN
2.0000 g | Freq: Once | INTRAVENOUS | Status: AC
  Administered 2021-11-01: 2 g via INTRAVENOUS
  Filled 2021-11-01: qty 50

## 2021-11-01 NOTE — Progress Notes (Signed)
Patient called in verbalized unable to urinate. Bladder scan shows 365 ml. Tried bed pan and purewick but unsuccessful. In and out done aseptically as ordered with Lyn, RN. Collected 350 ml UO. Pericare done. Send speciment to lab.

## 2021-11-01 NOTE — Progress Notes (Addendum)
PROGRESS NOTE    Deborah Jordan  YYT:[id number] DOB: October 29, 1933 DOA: 10/31/2021 PCP: [name], MD   Brief Narrative:  HPI: Deborah Jordan is a 87 y.o. female with medical history significant of hypertension, HLD, diastolic CHF, CAD, hyponatremia, prediabetes, and obesity who presents with complaints of shortness of breath, slow heart rates, and difficulty peeing.  She just recently been hospitalized 10/5 -10/17 with septic shock secondary to urosepsis.  Over the last week patient has not felt well.  She has a home health that had recently notified her that her heart rates were as low as 33 earlier in the week.  She followed up with her primary care provider 3 days ago and during that appointment was noted to have heart rates as low as 47 for which Coreg was discontinued.  She had a follow-up appointment with her cardiologist the following day and at that time amlodipine was decreased from 10 mg daily to 5 mg.  Records note patient was found to have a bifascicular block, but appear to be asymptomatic which pacemaker was not thought to be warranted at that time.  Over the last few days patient reports that she has not been urinating although she feels the urge to.  Notes associated symptoms of leg swelling and some nausea.  This morning she complained of feeling more short of breath and was unable to rest.  Denies having any significant fever, cough, abdominal pain, vomiting, or blood in stools.  Patient does note that she does have hemorrhoids.   In route with EMS patient was noted to have heart rates into the 27N with systolic blood pressures initially noted to be around 60s.  Patient was placed on 4 L nasal cannula oxygen and transferred to the emergency department.   ED Course: On admission into the emergency department patient was noted to have heart rates 36-55, respirations 16-24, blood pressures 119/98-160 4/45, and O2 saturations currently maintained on 3 L of nasal cannula oxygen.  She was  placed on oxygen more so for comfort and was not noted to be hypoxic.  Labs significant for hemoglobin 9.8, sodium 124, potassium 3.6, BUN 17, creatinine 1.66, BNP 525, troponin 21, and lactic acid 0.9.  Chest x-ray noted  Concern for congestive heart failure exacerbation.  Patient was given Lasix 40 mg IV x1 dose.  TRH called to admit  Assessment & Plan:   Principal Problem:   CHF (congestive heart failure) (HCC) Active Problems:   Hyponatremia   Hypertension   AKI (acute kidney injury) ([place])   Symptomatic bradycardia   Dysuria   Anemia   Acute heart failure with preserved ejection fraction (HFpEF) (HCC)   RBBB (right bundle branch block with left anterior fascicular block)   Hypomagnesemia   Atherosclerosis of both carotid arteries  Acute hypoxic respiratory failure secondary to acute on chronic diastolic congestive heart failure: BNP 525. Last echocardiogram revealed EF of 55 - 60%.  Chest x-ray with pulmonary edema.  Patient and significant respiratory distress and very tachypneic.  Has crackles up to middle zones of lungs bilaterally.  Seen by cardiology.  Plan to aggressively diurese with 80 mg IV Lasix and start on BiPAP.  She remains tenuous.  I discussed CODE STATUS with her and she would like to be full code.  Continue with strict I's and O's, daily weight.  No beta-blocker due to bradycardia.  Appreciate cardiology help.  Update: Went in to see the patient second time at 3:15 PM.  Patient continues to  remain on BiPAP.  Sleepy but easily arousable.  Looks very comfortable.  ABG reassuring as well.  Continue with current management.  Now appears to be much more stable than how she was this morning.   Symptomatic bradycardia: On admission heart rates reported to be anywhere from 36-46 with bifascicular block on EKG.  Blood pressure her currently maintained.    Patient had just been seen by her cardiologist Dr. Einar Gip on 11/11 after patient had been discontinued off of carvedilol due to  heart rate being as low as 47 on 11/10 at PCP.  Per cardiology, she may have third-degree heart block.  EP has been consulted.  TSH and magnesium normal. -Avoid AV nodal blocking agents -Apply temporary pacing pads to chest in case of need of pacing  AKI: Likely cardiorenal syndrome.  Treating CHF.  Currently creatinine 1.6.  Avoid all nephrotoxic agents.  Hyponatremia: Acute on chronic.  Sodium noted to be 124 on admission and currently 124 as well, likely secondary to CHF exacerbation.  Treat underlying cause.  Work-up does not support SIADH.  Hypomagnesemia: Replaced yesterday but is still at borderline, 1.7 today.  Would like to keep it over 2.  Will replace some more.   Dysuria: UA negative for UTI.   Essential hypertension: Only slightly elevated.  Amlodipine discontinued due to bradycardia.  Continue as needed hydralazine, blood pressure to be treated if systolic blood pressure over 170.   Elevated troponin: Troponin elevated slightly but flat, indicative of demand ischemia.   Anxiety -Continue Xanax as needed  Hyperlipidemia: Continue statin.  DVT prophylaxis: enoxaparin (LOVENOX) injection 30 mg Start: 10/31/21 1600   Code Status: Full Code  Family Communication:  None present at bedside.  Plan of care discussed with patient in length and he verbalized understanding and agreed with it.  Status is: Inpatient  Remains inpatient appropriate because: Needs management of acute medical issues.  Estimated body mass index is 39.74 kg/m as calculated from the following:   Height as of this encounter: 4\' 9"  (1.448 m).   Weight as of this encounter: 83.3 kg.     Nutritional Assessment: Body mass index is 39.74 kg/m.Marland Kitchen Seen by dietician.  I agree with the assessment and plan as outlined below: Nutrition Status:   Skin Assessment: I have examined the patient's skin and I agree with the wound assessment as performed by the wound care RN as outlined below:    Consultants:   Cardiology  Procedures:  None  Antimicrobials:  Anti-infectives (From admission, onward)    Start     Dose/Rate Route Frequency Ordered Stop   10/31/21 1600  trimethoprim (TRIMPEX) tablet 100 mg        100 mg Oral Daily 10/31/21 1444            Subjective: Patient seen and examined.  Patient was tachypneic and having hard time speaking but despite of that, she was able to communicate and answer questions.  Complaints of shortness of breath.  Objective: Vitals:   11/01/21 0332 11/01/21 0350 11/01/21 0847 11/01/21 0848  BP:    (!) 151/49  Pulse:  60 (!) 44 (!) 50  Resp:   (!) 22 20  Temp:  99 F (37.2 C)  99 F (37.2 C)  TempSrc:  Oral    SpO2:  93% 96%   Weight: 83.3 kg     Height:        Intake/Output Summary (Last 24 hours) at 11/01/2021 1056 Last data filed at 11/01/2021 0300 Gross  per 24 hour  Intake 650.24 ml  Output 750 ml  Net -99.76 ml   Filed Weights   10/31/21 0901 10/31/21 1423 11/01/21 0332  Weight: 78.5 kg 83.9 kg 83.3 kg    Examination:  General exam: Appears tachypneic and uncomfortable. Respiratory system: Crackles up to middle lobes bilaterally.  Tachypneic. Cardiovascular system: S1 & S2 heard, sinus tachycardia. No JVD, murmurs, rubs, gallops or clicks.  +2 pitting edema bilateral lower extremity Gastrointestinal system: Abdomen is nondistended, soft and nontender. No organomegaly or masses felt. Normal bowel sounds heard. Central nervous system: Alert and oriented. No focal neurological deficits. Extremities: Symmetric 5 x 5 power. Skin: No rashes, lesions or ulcers   Data Reviewed: I have personally reviewed following labs and imaging studies  CBC: Recent Labs  Lab 10/31/21 0919  WBC 9.7  NEUTROABS 7.6  HGB 9.8*  HCT 29.0*  MCV 86.1  PLT 161   Basic Metabolic Panel: Recent Labs  Lab 10/31/21 0919 10/31/21 1205 10/31/21 1448 11/01/21 0405  NA 124*  --  127* 124*  K 3.6  --  4.3 4.1  CL 91*  --  96* 94*  CO2 20*  --   19* 18*  GLUCOSE 119*  --  120* 108*  BUN 17  --  17 18  CREATININE 1.66*  --  1.59* 1.64*  CALCIUM 9.1  --  9.0 8.8*  MG  --  1.3*  --  1.7   GFR: Estimated Creatinine Clearance: 22 mL/min (A) (by C-G formula based on SCr of 1.64 mg/dL (H)). Liver Function Tests: Recent Labs  Lab 10/31/21 0919  AST 20  ALT 13  ALKPHOS 67  BILITOT 0.7  PROT 6.5  ALBUMIN 3.7   No results for input(s): LIPASE, AMYLASE in the last 168 hours. Recent Labs  Lab 10/31/21 0919  AMMONIA 10   Coagulation Profile: Recent Labs  Lab 10/31/21 0919  INR 1.1   Cardiac Enzymes: No results for input(s): CKTOTAL, CKMB, CKMBINDEX, TROPONINI in the last 168 hours. BNP (last 3 results) No results for input(s): PROBNP in the last 8760 hours. HbA1C: No results for input(s): HGBA1C in the last 72 hours. CBG: Recent Labs  Lab 10/31/21 1610  GLUCAP 117*   Lipid Profile: No results for input(s): CHOL, HDL, LDLCALC, TRIG, CHOLHDL, LDLDIRECT in the last 72 hours. Thyroid Function Tests: Recent Labs    10/31/21 0919  TSH 1.355   Anemia Panel: No results for input(s): VITAMINB12, FOLATE, FERRITIN, TIBC, IRON, RETICCTPCT in the last 72 hours. Sepsis Labs: Recent Labs  Lab 10/31/21 0919 10/31/21 1107  LATICACIDVEN 0.9 1.2    No results found for this or any previous visit (from the past 240 hour(s)).    Radiology Studies: DG Chest Portable 1 View  Result Date: 10/31/2021 CLINICAL DATA:  Shortness of breath.  Assess for pulmonary edema. EXAM: PORTABLE CHEST 1 VIEW COMPARISON:  September 24, 2021 FINDINGS: The heart size is enlarged. Mediastinal contour is normal. There is increased pulmonary interstitium bilaterally. Probable small left pleural effusion is identified. There is no focal pneumonia. No acute abnormality is identified in the osseous structures. IMPRESSION: Congestive heart failure. Electronically Signed   By: Abelardo Diesel M.D.   On: 10/31/2021 09:22    Scheduled Meds:  aspirin EC  81  mg Oral Daily   atorvastatin  20 mg Oral Daily   DULoxetine  20 mg Oral Daily   enoxaparin (LOVENOX) injection  30 mg Subcutaneous Q24H   furosemide  80 mg Intravenous  BID   gabapentin  100 mg Oral QHS   magnesium oxide  400 mg Oral BID   pantoprazole  40 mg Oral Daily   sodium chloride flush  3 mL Intravenous Q12H   sodium chloride  1 g Oral BID WC   tamsulosin  0.4 mg Oral QPC supper   trimethoprim  100 mg Oral Daily   Continuous Infusions:  sodium chloride     DOBUTamine       LOS: 1 day   Time spent: 37 minutes   Darliss Cheney, MD [facility]  11/01/2021, 10:56 AM  Please page via [place] [place] and do not message via secure chat for anything urgent. Secure chat can be used for anything non urgent.  How to contact the [facility] Attending or Consulting provider [place] or covering provider during after hours [place], for this patient?  Check the care team in [facility] and look for a) attending/consulting TR[facility] provider listed and b) the [facility] team listed. Page or secure chat [phone]. Log into [email or web address] and use [place]'s universal password to access. If you do not have the password, please contact the hospital operator. Locate the [facility] provider you are looking for under [facility] and page to a number that you can be directly reached. If you still have difficulty reaching the provider, please page the [facility] (Director on Call) for the Hospitalists listed on amion for assistance.

## 2021-11-01 NOTE — Consult Note (Addendum)
ELECTROPHYSIOLOGY CONSULT NOTE    Patient ID: Deborah Jordan MRN: [id number], DOB/AGE: 1933/06/17 87 y.o.  Admit date: 10/31/2021 Date of Consult: 11/01/2021  Primary Physician: [name], MD Primary Cardiologist: None  Electrophysiologist:  New  Referring Provider: Dr. Telford Nab  Patient Profile: Deborah Jordan is a 87 y.o. female with a history of HTN, CKD, HLD, bifascicular block, and hyponatremia who is being seen today for the evaluation of bradycardia and heart failure at the request of Dr. Telford Nab.  HPI:  Deborah Jordan is a 87 y.o. female with medical history as above followed by Dr. Einar Gip.   Patient presented to [facility] via EMS for symptoms of shortness of breath, slow heart rate, and burning with urination.  Per EMR first responders noted bradycardia with heart rates in the 30s.  Patient was placed on 4 L nasal cannula and transferred to [name] ED for further evaluation and management.  Cardiology initially consulted for congestive heart failure management and bradycardia.  Patient last saw Dr. Einar Gip 10/29/2021 who reemphasized holding beta-blockers and dose of amlodipine reduced to 5 mg p.o. daily.     Patient is chronically poor historian. HPI obtained from notes in chart. Daughter states that she recently was diagnosed with another urinary tract infection by urology and started on Bactrim and she is also been on sodium tablets given her chronic hyponatremia.  Pt denies CP. She has noticed SOB over the past couple of weeks at rest. Denies orthopnea, PND. Peripheral edema noted but improving on IV lasix.    Patient's daughter has said she has been off BB for at least 1 week.   Limited echo 09/25/2021 LVEF 55-60% in the setting of bacteremia (admission 10/5 - 10/17 in setting of E. Coli and Strep. Infantarius bacteremia.  On our exam, pt is on Bipap and somnolent.  She awakes and nods "no" to being in pain, but doesn't respond further.  Primary team alerted.    Past Medical History:  Diagnosis Date   Anxiety    Arthritis    osteoarthritis. spinal stenosis. Scoliosis of spine-degenerative spine.   Bilateral cataracts    CAD (coronary artery disease)    minimal, improved on right 06/2016   Chronic kidney disease    STAGE 4   DDD (degenerative disc disease), lumbar    Dyspnea    Elevated cholesterol    GERD (gastroesophageal reflux disease)    controls with Nexium   Grade I diastolic dysfunction 76/16/0737   Noted on ECHO   History of cardiomegaly    History of gallstones    Hypertension    LVH (left ventricular hypertrophy) 05/04/2017   Mil, noted on ECHO   Mild depression    Pre-diabetes    RBBB (right bundle branch block)    Spinal stenosis    Tubular adenoma    and benign polyps   Vitamin D deficiency    Wears partial dentures      Surgical History:  Past Surgical History:  Procedure Laterality Date   CATARACT EXTRACTION Bilateral 03/06/2013   CHOLECYSTECTOMY     COLONOSCOPY     CRYOTHERAPY     DILATION AND CURETTAGE OF UTERUS     esi     HEMORRHOID SURGERY N/A 09/21/2018   Procedure: SINGLE COLUMN HEMORRHOIDECTOMY, HEMORRHOIDPEXY;  Surgeon: Leighton Ruff, MD;  Location: [place];  Service: General;  Laterality: N/A;   TOTAL HIP ARTHROPLASTY Left 03/08/2013   Procedure: LEFT TOTAL HIP ARTHROPLASTY ANTERIOR APPROACH;  Surgeon: Harrell Gave  Kerry Fort, MD;  Location: [facility];  Service: Orthopedics;  Laterality: Left;     Medications Prior to Admission  Medication Sig Dispense Refill Last Dose   acetaminophen (TYLENOL) 650 MG CR tablet Take 650 mg by mouth every 8 (eight) hours as needed for pain.   Past Month   ALPRAZolam (XANAX) 0.25 MG tablet Take 1 tablet (0.25 mg total) by mouth 3 (three) times daily as needed for anxiety. 30 tablet 0 Past Week   amLODipine (NORVASC) 10 MG tablet Take 0.5 tablets (5 mg total) by mouth daily. 15 tablet 2 10/30/2021   aspirin EC 81 MG tablet Take 81 mg by mouth daily.  Swallow whole.   10/30/2021   atorvastatin (LIPITOR) 20 MG tablet Take 1 tablet (20 mg total) by mouth daily. 30 tablet 0 10/28/2021   Cholecalciferol (VITAMIN D) 2000 units CAPS Take 2,000 Units by mouth daily.   10/30/2021   DULoxetine (CYMBALTA) 20 MG capsule Take 1 capsule (20 mg total) by mouth daily. 30 capsule 3 10/30/2021   esomeprazole (NEXIUM) 40 MG capsule Take 40 mg by mouth daily.   10/30/2021   gabapentin (NEURONTIN) 100 MG capsule Take 1 capsule (100 mg total) by mouth at bedtime. 30 capsule 3 10/30/2021   sodium chloride 1 g tablet Take 1 g by mouth 2 (two) times daily with a meal.   10/30/2021   sulfamethoxazole-trimethoprim (BACTRIM DS) 800-160 MG tablet Take 1 tablet by mouth 2 (two) times daily.   10/30/2021   tamsulosin (FLOMAX) 0.4 MG CAPS capsule Take 1 capsule (0.4 mg total) by mouth daily after supper. 30 capsule 0 10/30/2021   trimethoprim (TRIMPEX) 100 MG tablet Take 100 mg by mouth daily.   Not started yet   loratadine (CLARITIN) 10 MG tablet Take 10 mg by mouth daily.       Inpatient Medications:   aspirin EC  81 mg Oral Daily   atorvastatin  20 mg Oral Daily   DULoxetine  20 mg Oral Daily   enoxaparin (LOVENOX) injection  30 mg Subcutaneous Q24H   furosemide  80 mg Intravenous BID   gabapentin  100 mg Oral QHS   magnesium oxide  400 mg Oral BID   pantoprazole  40 mg Oral Daily   sodium chloride flush  3 mL Intravenous Q12H   sodium chloride  1 g Oral BID WC   tamsulosin  0.4 mg Oral QPC supper   trimethoprim  100 mg Oral Daily    Allergies:  Allergies  Allergen Reactions   Ace Inhibitors Cough   Hydrochlorothiazide Other (See Comments)    Low Sodium    Social History   Socioeconomic History   Marital status: Widowed    Spouse name: Not on file   Number of children: 2   Years of education: Not on file   Highest education level: Not on file  Occupational History   Not on file  Tobacco Use   Smoking status: Never   Smokeless tobacco: Never   Vaping Use   Vaping Use: Never used  Substance and Sexual Activity   Alcohol use: Yes    Comment: OCC   Drug use: Never   Sexual activity: Not Currently    Birth control/protection: Post-menopausal  Other Topics Concern   Not on file  Social History Narrative   Not on file   Social Determinants of Health   Financial Resource Strain: Not on file  Food Insecurity: Not on file  Transportation Needs: Not on file  Physical Activity: Not on file  Stress: Not on file  Social Connections: Not on file  Intimate Partner Violence: Not on file     Family History  Problem Relation Age of Onset   Heart disease Father    Heart failure Father    Diabetes Father    Atrial fibrillation Sister      Review of Systems: All other systems reviewed and are otherwise negative except as noted above.  Physical Exam: Vitals:   11/01/21 0200 11/01/21 0332 11/01/21 0350 11/01/21 0847  BP: (!) 151/49     Pulse:   60 (!) 44  Resp: 18   (!) 22  Temp:   99 F (37.2 C)   TempSrc:   Oral   SpO2: 94%  93% 96%  Weight:  83.3 kg    Height:        GEN- The patient is on BiPAP and somnolent. Stirs briefly to loud voices.  HEENT: normocephalic, atraumatic; sclera clear, conjunctiva pink; hearing intact; oropharynx clear; neck supple Lungs- On BiPAP, +crackles. Heart- Regular rate and rhythm, no murmurs, rubs or gallops GI- obese, soft, non-distended, bowel sounds present Extremities- no clubbing or cyanosis. + bilateral peripheral edema MS- no significant deformity or atrophy Skin- warm and dry, no rash or lesion Psych- Somnolent on BiPAP Neuro- strength and sensation are intact  Labs:   Lab Results  Component Value Date   WBC 9.7 10/31/2021   HGB 9.8 (L) 10/31/2021   HCT 29.0 (L) 10/31/2021   MCV 86.1 10/31/2021   PLT 274 10/31/2021    Recent Labs  Lab 10/31/21 0919 10/31/21 1448 11/01/21 0405  NA 124*   < > 124*  K 3.6   < > 4.1  CL 91*   < > 94*  CO2 20*   < > 18*  BUN 17    < > 18  CREATININE 1.66*   < > 1.64*  CALCIUM 9.1   < > 8.8*  PROT 6.5  --   --   BILITOT 0.7  --   --   ALKPHOS 67  --   --   ALT 13  --   --   AST 20  --   --   GLUCOSE 119*   < > 108*   < > = values in this interval not displayed.      Radiology/Studies: DG Chest Portable 1 View  Result Date: 10/31/2021 CLINICAL DATA:  Shortness of breath.  Assess for pulmonary edema. EXAM: PORTABLE CHEST 1 VIEW COMPARISON:  September 24, 2021 FINDINGS: The heart size is enlarged. Mediastinal contour is normal. There is increased pulmonary interstitium bilaterally. Probable small left pleural effusion is identified. There is no focal pneumonia. No acute abnormality is identified in the osseous structures. IMPRESSION: Congestive heart failure. Electronically Signed   By: Abelardo Diesel M.D.   On: 10/31/2021 09:22    EKG:on arrival shows 2:1 AV block at 48 bpm (personally reviewed)  TELEMETRY: Currently in 2:1 AV block in 40s primarily, Earlier in admission had sinus brady. No true 3rd degree block noted, but auto-gain on telemetry resulting in undersensing of R waves (personally reviewed)  Assessment/Plan: 1.  Advanced AV block  With at least bifascicular block at baseline. EKG 06/2021 shows RBBB and LAFB with borderline 1st degree AV block She may be a poor candidate for transvenous pacing with recurrent UTIs and recent urosepsis. She remains in florid HF and may be best to optimize and consider pacing later in the week ?  If would be better candidate for leadless pacer, but her atrial rates do appear to regularly be in the 80-90 range which may impede A-V synchrony. Her bradycardia is likely exacerbated currently by her ongoing respiratory distress.  2. Acute on chronic diastolic CHF Volume status elevated. Diureses per primary cards Her IV lasix was increased this am.   3. Acute respiratory failure She is currently somnolent on BiPAP Stat ABG ordered and primary team notified.  We are unable to  discuss pacing with the patient today given her somnolence and acute respiratory failure in the setting of acute on chronic diastolic CHF. Ultimately she may be candidate for pacing, but her 2:1 AV block does not appear to be the primary driver of her current issues. May ultimately be best to let her get further out from the hospital visit and recent urosepsis and follow up closely as outpatient for pacing, pending her course. We will follow along.  For questions or updates, please contact [place] Please consult [email or web address] for contact info under Cardiology/STEMI.  Jacalyn Lefevre, PA-C  11/01/2021 9:32 AM

## 2021-11-01 NOTE — Progress Notes (Signed)
Subjective:  "I can't breathe"  Objective:  Vital Signs in the last 24 hours: Temp:  [97.5 F (36.4 C)-99 F (37.2 C)] 99 F (37.2 C) (11/14 0350) Pulse Rate:  [42-74] 44 (11/14 0847) Resp:  [18-31] 22 (11/14 0847) BP: (106-176)/(43-114) 151/49 (11/14 0200) SpO2:  [91 %-97 %] 96 % (11/14 0847) Weight:  [83.3 kg-83.9 kg] 83.3 kg (11/14 0332)  Intake/Output from previous day: 11/13 0701 - 11/14 0700 In: 650.2 [P.O.:600; IV Piggyback:50.2] Out: 750 [Urine:750]  Physical Exam Vitals and nursing note reviewed.  Constitutional:      General: She is not in acute distress.    Appearance: She is well-developed.  HENT:     Head: Normocephalic and atraumatic.  Eyes:     Conjunctiva/sclera: Conjunctivae normal.     Pupils: Pupils are equal, round, and reactive to light.  Neck:     Vascular: No JVD.  Cardiovascular:     Rate and Rhythm: Normal rate and regular rhythm.     Pulses: Normal pulses and intact distal pulses.     Heart sounds: Murmur heard.  High-pitched blowing holosystolic murmur is present with a grade of 2/6 at the apex.  Pulmonary:     Effort: Tachypnea and respiratory distress present.     Breath sounds: Examination of the right-middle field reveals rales. Examination of the left-middle field reveals rales. Examination of the right-lower field reveals rales. Examination of the left-lower field reveals rales. Decreased breath sounds, wheezing and rales present.  Abdominal:     General: Bowel sounds are normal.     Palpations: Abdomen is soft.     Tenderness: There is no rebound.  Musculoskeletal:        General: No tenderness. Normal range of motion.     Right lower leg: Edema (Trace) present.     Left lower leg: Edema (Trace) present.  Lymphadenopathy:     Cervical: No cervical adenopathy.  Skin:    General: Skin is warm and dry.  Neurological:     Mental Status: She is alert and oriented to person, place, and time.     Cranial Nerves: No cranial nerve  deficit.     Lab Results: BMP Recent Labs    10/31/21 0919 10/31/21 1448 11/01/21 0405  NA 124* 127* 124*  K 3.6 4.3 4.1  CL 91* 96* 94*  CO2 20* 19* 18*  GLUCOSE 119* 120* 108*  BUN 17 17 18   CREATININE 1.66* 1.59* 1.64*  CALCIUM 9.1 9.0 8.8*  GFRNONAA 30* 31* 30*    CBC Recent Labs  Lab 10/31/21 0919  WBC 9.7  RBC 3.37*  HGB 9.8*  HCT 29.0*  PLT 274  MCV 86.1  MCH 29.1  MCHC 33.8  RDW 14.0  LYMPHSABS 1.2  MONOABS 0.8  EOSABS 0.0  BASOSABS 0.0    Cardiac Panel (last 3 results) Results for RICCI, PAFF (MRN [id number]) as of 11/01/2021 09:56  Ref. Range 09/22/2021 08:11 09/22/2021 12:03 10/31/2021 09:19 10/31/2021 11:07  B Natriuretic Peptide Latest Ref Range: 0.0 - 100.0 pg/mL   525.0 (H)   Troponin I (High Sensitivity) Latest Ref Range: <18 ng/L 24 (H) 24 (H) 21 (H) 20 (H)    BNP (last 3 results) Recent Labs    06/19/21 0319 10/31/21 0919  BNP 163.9* 525.0*    TSH Recent Labs    06/19/21 0319 10/31/21 0919  TSH 1.872 1.355    Lipid Panel  No results found for: CHOL, TRIG, HDL, CHOLHDL, VLDL, LDLCALC, LDLDIRECT  Hepatic Function Panel Recent Labs    09/23/21 0343 09/26/21 0220 09/30/21 0148 10/31/21 0919  PROT 5.1* 5.1* 5.6* 6.5  ALBUMIN 2.4* 2.2* 2.4* 3.7  AST 240* 33 21 20  ALT 370* 114* 30 13  ALKPHOS 153* 191* 114 67  BILITOT 1.4* 0.8 0.8 0.7  BILIDIR 0.7*  --   --   --   IBILI 0.7  --   --   --     Imaging: Chest Xray 10/31/2021: The heart size is enlarged. Mediastinal contour is normal. There is increased pulmonary interstitium bilaterally. Probable small left pleural effusion is identified. There is no focal pneumonia. No acute abnormality is identified in the osseous structures.   IMPRESSION: Congestive heart failure.    Cardiac Studies:  EKG 10/31/2021: Sinus bradycardia Borderline prolonged PR interval IVCD, consider atypical RBBB LVH with secondary repolarization abnormality Inferior infarct,  old Anterior Q waves, possibly due to LVH  Echocardiogram 09/24/2021:  1. Left ventricular ejection fraction, by estimation, is 55 to 60%. The  left ventricle has normal function. The left ventricle has no regional  wall motion abnormalities.   2. Right ventricular systolic function is normal. The right ventricular  size is normal.   3. Left atrial size was mildly dilated.   4. Mild mitral valve regurgitation. There is moderate holosystolic  prolapse of the middle scallop of the posterior leaflet of the mitral  valve.   5. The aortic valve is calcified. Aortic valve regurgitation is not  visualized.   Conclusion(s)/Recommendation(s): No evidence of valvular vegetations on  this transthoracic echocardiogram. Would recommend a transesophageal  echocardiogram to exclude infective endocarditis if clinically indicated.   Assessment & Recommendations:  87 year old Caucasian female with hypertension, hyperlipidemia, CKD, now admitted with shortness of breath, bradycardia  Acute on chronic HFpEF: In decompensated heart failure at this time with mild respiratory distress. Etiology of acute decompensation unclear. Echocardiogram in 09/2021 showed moderate mitral valve prolapse but only mild MR. Patient does have intermittent bradycardia episodes, but I am not convinced that this is the cause of her acute heart failure exacerbation. Recommend aggressive diuresis.  Change IV Lasix to 80 mg twice daily.  Also consider BiPAP, if patient can tolerate.  Recommend strict I's/O.   Bradycardia: Dominant rhythm sinus, with intermittent 2: 1, as well as the degree AV block. Off carvedilol since 10/29/2021. Consulted EP.  Continue close monitoring. Hemodynamically stable with no urgent need for temporary pacing.  Elevated troponin: Likely type II MI in the setting of acute heart failure.  Not acute coronary syndrome.  AKI/CKD: Likely cardiorenal syndrome.  Continue diuresis and close monitoring of  renal function.  Hyponatremia: Most likely dilutional in the setting of acute heart failure.  Continue diuresis for now.     Nigel Mormon, MD Pager: [phone] Office: [phone]

## 2021-11-01 NOTE — Plan of Care (Signed)

## 2021-11-01 NOTE — Progress Notes (Signed)
Patient having  episodes of bradycardia susutaning for 1-2 mins. Md informed

## 2021-11-01 NOTE — Progress Notes (Signed)
Heart Failure Nurse Navigator Progress Note  Following this admission for HV TOC readiness. Pt is followed by [facility] as outpatient (Dr. Einar Gip). Able to provide HF resources and education.   Able to offer HV TOC f/u appt upon DC if unable to get quick appt at [facility] office.   Pricilla Holm, MSN, RN Heart Failure Nurse Navigator [phone]

## 2021-11-01 NOTE — Consult Note (Signed)
   [facility] CM Inpatient Consult   11/01/2021  [name] [birth year] [id number]  [place] [facility] [ACO] Patient:  [name]  Primary Care Provider:  [name], MD, [place], [place]   Patient screened for hospitalization with noted extreme high risk score for unplanned readmission risk and  to assess for potential [facility] Management service needs for post hospital transition.  Patient was being assisted by staff, issues with shortness of breath. Progression meeting discuss likely need higher level of care.   Plan:  Continue to follow progress and disposition to assess for post hospital care management needs.    For questions contact:   Natividad Brood, RN BSN [facility] Liaison  [phone] business mobile phone Toll free office [phone]  Fax number: [phone] [email or web address]

## 2021-11-01 NOTE — Progress Notes (Signed)
Patient complained of SOB, bilateral wheezes, Sp02= 88%. Anxious. Albuterol neb given. Scheduled Furosemide IV given. Oxygen at 5 lpm via [place]. Informed RT. Informed Dr. Einar Grad.  At 0730H, patient verbalized improvement of symptoms.

## 2021-11-02 ENCOUNTER — Inpatient Hospital Stay (HOSPITAL_COMMUNITY): Payer: Medicare Other

## 2021-11-02 ENCOUNTER — Encounter (HOSPITAL_COMMUNITY)
Admission: EM | Disposition: A | Discharge status: Skilled Nursing Facility | Payer: Self-pay | Source: Home / Self Care | Attending: Cardiology

## 2021-11-02 ENCOUNTER — Encounter (HOSPITAL_COMMUNITY): Payer: Self-pay | Admitting: Cardiology

## 2021-11-02 DIAGNOSIS — I4430 Unspecified atrioventricular block: Secondary | ICD-10-CM

## 2021-11-02 DIAGNOSIS — R0603 Acute respiratory distress: Secondary | ICD-10-CM | POA: Diagnosis present

## 2021-11-02 DIAGNOSIS — J810 Acute pulmonary edema: Secondary | ICD-10-CM | POA: Diagnosis present

## 2021-11-02 DIAGNOSIS — I442 Atrioventricular block, complete: Secondary | ICD-10-CM | POA: Diagnosis present

## 2021-11-02 DIAGNOSIS — I340 Nonrheumatic mitral (valve) insufficiency: Secondary | ICD-10-CM | POA: Diagnosis present

## 2021-11-02 HISTORY — PX: RIGHT HEART CATH: CATH118263

## 2021-11-02 HISTORY — PX: TEMPORARY PACEMAKER: CATH118268

## 2021-11-02 LAB — LACTIC ACID, PLASMA
Lactic Acid, Venous: 1.1 mmol/L (ref 0.5–1.9)
Lactic Acid, Venous: 1.1 mmol/L (ref 0.5–1.9)

## 2021-11-02 LAB — ECHOCARDIOGRAM COMPLETE
AV Mean grad: 14 mmHg
AV Peak grad: 25.4 mmHg
Ao pk vel: 2.52 m/s
Height: 57 in
S' Lateral: 2.6 cm
Weight: 2913.6 oz

## 2021-11-02 LAB — CBC WITH DIFFERENTIAL/PLATELET
Abs Immature Granulocytes: 0.08 10*3/uL — ABNORMAL HIGH (ref 0.00–0.07)
Basophils Absolute: 0 10*3/uL (ref 0.0–0.1)
Basophils Relative: 0 %
Eosinophils Absolute: 0 10*3/uL (ref 0.0–0.5)
Eosinophils Relative: 0 %
HCT: 26.9 % — ABNORMAL LOW (ref 36.0–46.0)
Hemoglobin: 9.4 g/dL — ABNORMAL LOW (ref 12.0–15.0)
Immature Granulocytes: 1 %
Lymphocytes Relative: 11 %
Lymphs Abs: 1.2 10*3/uL (ref 0.7–4.0)
MCH: 29.4 pg (ref 26.0–34.0)
MCHC: 34.9 g/dL (ref 30.0–36.0)
MCV: 84.1 fL (ref 80.0–100.0)
Monocytes Absolute: 1.1 10*3/uL — ABNORMAL HIGH (ref 0.1–1.0)
Monocytes Relative: 10 %
Neutro Abs: 8.7 10*3/uL — ABNORMAL HIGH (ref 1.7–7.7)
Neutrophils Relative %: 78 %
Platelets: 270 10*3/uL (ref 150–400)
RBC: 3.2 MIL/uL — ABNORMAL LOW (ref 3.87–5.11)
RDW: 14.3 % (ref 11.5–15.5)
WBC: 11.1 10*3/uL — ABNORMAL HIGH (ref 4.0–10.5)
nRBC: 0 % (ref 0.0–0.2)

## 2021-11-02 LAB — BASIC METABOLIC PANEL WITH GFR
Anion gap: 12 (ref 5–15)
BUN: 24 mg/dL — ABNORMAL HIGH (ref 8–23)
CO2: 19 mmol/L — ABNORMAL LOW (ref 22–32)
Calcium: 8.8 mg/dL — ABNORMAL LOW (ref 8.9–10.3)
Chloride: 96 mmol/L — ABNORMAL LOW (ref 98–111)
Creatinine, Ser: 1.84 mg/dL — ABNORMAL HIGH (ref 0.44–1.00)
GFR, Estimated: 26 mL/min — ABNORMAL LOW
Glucose, Bld: 82 mg/dL (ref 70–99)
Potassium: 4.6 mmol/L (ref 3.5–5.1)
Sodium: 127 mmol/L — ABNORMAL LOW (ref 135–145)

## 2021-11-02 LAB — MAGNESIUM: Magnesium: 2.1 mg/dL (ref 1.7–2.4)

## 2021-11-02 LAB — COOXEMETRY PANEL
Carboxyhemoglobin: 1.1 % (ref 0.5–1.5)
Methemoglobin: 0.9 % (ref 0.0–1.5)
O2 Saturation: 74.9 %
Total hemoglobin: 9.4 g/dL — ABNORMAL LOW (ref 12.0–16.0)

## 2021-11-02 SURGERY — RIGHT HEART CATH

## 2021-11-02 MED ORDER — ISOSORB DINITRATE-HYDRALAZINE 20-37.5 MG PO TABS
1.0000 | ORAL_TABLET | Freq: Three times a day (TID) | ORAL | Status: DC
Start: 2021-11-02 — End: 2021-11-02
  Filled 2021-11-02: qty 1

## 2021-11-02 MED ORDER — SODIUM CHLORIDE 0.9% FLUSH: 3.0000 mL | INTRAVENOUS | Status: DC | PRN

## 2021-11-02 MED ORDER — SODIUM CHLORIDE 0.9% FLUSH
3.0000 mL | Freq: Two times a day (BID) | INTRAVENOUS | Status: DC
  Administered 2021-11-02 – 2021-11-07 (×10): 3 mL via INTRAVENOUS

## 2021-11-02 MED ORDER — SODIUM CHLORIDE 0.9 % IV SOLN: 250.0000 mL | INTRAVENOUS | Status: DC | PRN

## 2021-11-02 MED ORDER — LIDOCAINE HCL (PF) 1 % IJ SOLN
INTRAMUSCULAR | Status: DC | PRN
  Administered 2021-11-02: 5 mL via SUBCUTANEOUS

## 2021-11-02 MED ORDER — SODIUM CHLORIDE 0.9 % IV SOLN: INTRAVENOUS | Status: DC

## 2021-11-02 MED ORDER — SODIUM CHLORIDE 0.9% FLUSH
3.0000 mL | Freq: Two times a day (BID) | INTRAVENOUS | Status: DC
  Administered 2021-11-02 – 2021-11-07 (×7): 3 mL via INTRAVENOUS

## 2021-11-02 MED ORDER — HYDRALAZINE HCL 20 MG/ML IJ SOLN
10.0000 mg | INTRAMUSCULAR | Status: AC | PRN
  Filled 2021-11-02: qty 1

## 2021-11-02 MED ORDER — FUROSEMIDE 10 MG/ML IJ SOLN
80.0000 mg | INTRAMUSCULAR | Status: DC
  Administered 2021-11-02 – 2021-11-03 (×5): 80 mg via INTRAVENOUS
  Filled 2021-11-02 (×5): qty 8

## 2021-11-02 MED ORDER — LIDOCAINE HCL (PF) 1 % IJ SOLN
INTRAMUSCULAR | Status: AC
  Filled 2021-11-02: qty 30

## 2021-11-02 MED ORDER — MILRINONE LACTATE IN DEXTROSE 20-5 MG/100ML-% IV SOLN
0.2500 ug/kg/min | INTRAVENOUS | Status: DC
  Administered 2021-11-02 – 2021-11-03 (×3): 0.25 ug/kg/min via INTRAVENOUS
  Filled 2021-11-02 (×3): qty 100

## 2021-11-02 SURGICAL SUPPLY — 9 items
CABLE ADAPT PACING TEMP 12FT (ADAPTER) ×1 IMPLANT
CATH BALLN WEDGE 5F 110CM (CATHETERS) ×1 IMPLANT
CATH S G BIP PACING (CATHETERS) ×2 IMPLANT
ELECT DEFIB PAD ADLT CADENCE (PAD) ×1 IMPLANT
KIT HEART LEFT (KITS) ×1 IMPLANT
KIT MICROPUNCTURE NIT STIFF (SHEATH) ×1 IMPLANT
PACK CARDIAC CATHETERIZATION (CUSTOM PROCEDURE TRAY) ×1 IMPLANT
SHEATH PINNACLE 6F 10CM (SHEATH) ×1 IMPLANT
SLEEVE REPOSITIONING LENGTH 30 (MISCELLANEOUS) ×1 IMPLANT

## 2021-11-02 NOTE — Progress Notes (Signed)
Went to see patient but patient was already taken down to Cath Lab for cath.  She was then transferred to [facility].  Discussed over the phone with Dr. Einar Gip.  He graciously excepted the patient under his care as primary attending and agreed for hospitalist service to sign off for now.  We will be happy to be involved if our services are needed again.  Please reconsult.

## 2021-11-02 NOTE — Interval H&P Note (Signed)
History and Physical Interval Note:  11/02/2021 8:58 AM  Deborah Jordan  has presented today for surgery, with the diagnosis of heart block.  The various methods of treatment have been discussed with the patient and family. After consideration of risks, benefits and other options for treatment, the patient has consented to  Procedure(s): RIGHT HEART CATH (N/A) TEMPORARY PACEMAKER (N/A) as a surgical intervention.  The patient's history has been reviewed, patient examined, no change in status, stable for surgery.  I have reviewed the patient's chart and labs.  Questions were answered to the patient's satisfaction.   EKG 11/02/2021: Underlying sinus tachycardia at rate of 110 bpm with complete heart block.  Ventricular escape at 40 bpm.  Cannot exclude anterolateral infarct old.  I have discussed right heart catheterization and placement of temporary pacemaker insertion with the patient and also over the telephone with her daughter.  This is being done on a urgent basis in view of complete heart block and acute respiratory distress.  Adrian Prows

## 2021-11-02 NOTE — Progress Notes (Signed)
  Echocardiogram 2D Echocardiogram has been performed.  Deborah Jordan 11/02/2021, 5:49 PM

## 2021-11-02 NOTE — TOC Initial Note (Signed)
Transition of Care [facility] - Initial/Assessment Note    Patient Details  Name: EMMERIE BATTAGLIA MRN: [id number] Date of Birth: August 14, 1933  Transition of Care [facility]) CM/SW Contact:    Bethena Roys, RN Phone Number: 11/02/2021, 3:20 PM  Clinical Narrative:   Risk for readmission assessment completed. Prior to arrival; patient was from home with the support of her daughter. Daughter Meredith Mody states that the patient is active with [facility] services with [facility] RN/PT. Prior to arrival, daughter was looking into Assisted Living Facilities for the patient. Daughter feels that the patient needs more care that can be provided in the home. Patient has durable medical equipment: rolling walker with seat. As patient progresses, she will benefit from PT/OT recommendations. Case Manager will continue to follow for additional needs.                 Expected Discharge Plan: Assisted Living Barriers to Discharge: Continued Medical Work up   Patient Goals and CMS Choice Patient states their goals for this hospitalization and ongoing recovery are:: Case Manager spoke with daughter via phone- in the process of looking into Assisted Living Facilities.      Expected Discharge Plan and Services Expected Discharge Plan: Assisted Living   Discharge Planning Services: CM Consult   Living arrangements for the past 2 months: Single Family Home                       Prior Living Arrangements/Services Living arrangements for the past 2 months: Single Family Home Lives with:: Self, Adult Children (lives with daughter) Patient language and need for interpreter reviewed:: Yes        Need for Family Participation in Patient Care: Yes (Comment) Care giver support system in place?: Yes (comment) Current home services: DME (Patient has DME RW- Active with [facility] RN-PT services)    Activities of Daily Living Home Assistive Devices/Equipment: Environmental consultant (specify type) ADL Screening (condition at time of  admission) Patient's cognitive ability adequate to safely complete daily activities?: Yes Is the patient deaf or have difficulty hearing?: No Does the patient have difficulty seeing, even when wearing glasses/contacts?: No Does the patient have difficulty concentrating, remembering, or making decisions?: No Patient able to express need for assistance with ADLs?: Yes Does the patient have difficulty dressing or bathing?: No Independently performs ADLs?: No Communication: Independent Dressing (OT): Independent Grooming: Independent Feeding: Independent Bathing: Needs assistance Is this a change from baseline?: Pre-admission baseline Toileting: Independent with device (comment) In/Out Bed: Needs assistance Is this a change from baseline?: Pre-admission baseline Walks in Home: Independent with device (comment) Does the patient have difficulty walking or climbing stairs?: Yes Weakness of Legs: Both Weakness of Arms/Hands: None  Permission Sought/Granted         Permission granted to share info w AGENCY: [facility]        Emotional Assessment Appearance:: Appears stated age       Alcohol / Substance Use: Not Applicable Psych Involvement: No (comment)  Admission diagnosis:  CHF (congestive heart failure) ([place]) [I50.9] Patient Active Problem List   Diagnosis Date Noted   Acute respiratory distress    Heart block AV complete ([place])    Acute pulmonary edema ([facility])    Nonrheumatic mitral valve regurgitation    CHF (congestive heart failure) ([place]) 10/31/2021   Symptomatic bradycardia 10/31/2021   Dysuria 10/31/2021   Anemia 10/31/2021   Acute heart failure with preserved ejection fraction (HFpEF) (H[facility])    RBBB (right bundle  branch block with left anterior fascicular block)    Hypomagnesemia    Atherosclerosis of both carotid arteries    Bacteremia    Septic shock ([place]) 09/22/2021   AKI (acute kidney injury) ([place])    Hypokalemia    Transaminitis    Dyspnea and respiratory  abnormalities    Chronic bilateral low back pain with bilateral sciatica 09/27/2017   Bilateral chronic knee pain 03/13/2017   Pleural effusion 03/10/2013   Hyponatremia 03/10/2013   Hypertension 03/10/2013   Degenerative arthritis of hip 03/08/2013   PCP:  [name], MD Pharmacy:   [facility] [place], [place] - [address] [place] [address] [place] [address] Phone: [phone] Fax: [phone]    Readmission Risk Interventions Readmission Risk Prevention Plan 11/02/2021  Transportation Screening Complete  Medication Review (RN Care Manager) Complete  PCP or Specialist appointment within 3-5 days of discharge Complete  HRI or [place] Complete  SW Recovery Care/Counseling Consult Complete  Palliative Care Screening Not [place] Not Applicable  Some recent data might be hidden

## 2021-11-02 NOTE — Progress Notes (Signed)
Pt transported to Cath lab and to [place] without any complications.

## 2021-11-02 NOTE — H&P (View-Only) (Signed)
Subjective:  Patient in mild respiratory distress, unable to lay down, presently on BiPAP.  He is alert and oriented x3, responds appropriately.  Intake/Output from previous day:  I/O last 3 completed shifts: In: 480 [P.O.:480] Out: 800 [Urine:800] No intake/output data recorded.  Blood pressure (!) 187/42, pulse (!) 41, temperature 98.3 F (36.8 C), temperature source Oral, resp. rate (!) 25, height _0  (1.448 m), weight 82.6 kg, SpO2 93 %.  Filed Weights   10/31/21 1423 11/01/21 0332 11/02/21 0513  Weight: 83.9 kg 83.3 kg 82.6 kg     Vitals with BMI 11/02/2021 11/02/2021 11/02/2021  Height - - -  Weight - - 182 lbs 2 oz  BMI - - 32.9  Systolic 924 - 268  Diastolic 42 - 48  Pulse 41 58 43    Physical Exam Constitutional:      General: She is in acute distress.     Appearance: She is obese. She is ill-appearing.     Interventions: Face mask in place.  Eyes:     Extraocular Movements: Extraocular movements intact.  Neck:     Vascular: No carotid bruit or JVD.  Cardiovascular:     Heart sounds: Murmur heard.  Crescendo-decrescendo mid to late systolic murmur is present with a grade of 3/6 at the apex.  Pulmonary:     Effort: Tachypnea, accessory muscle usage and respiratory distress present.     Breath sounds: Decreased air movement present. Rales (diffuse bilateral scattered) present.  Abdominal:     General: Abdomen is flat. Bowel sounds are normal.     Palpations: Abdomen is soft.  Musculoskeletal:     Right lower leg: No edema.     Left lower leg: No edema.  Skin:    General: Skin is warm.     Capillary Refill: Capillary refill takes less than 2 seconds.  Neurological:     General: No focal deficit present.     Mental Status: She is alert and oriented to person, place, and time.  Psychiatric:        Behavior: Behavior is cooperative.    Lab Results: ProBNP (last 3 results) No results for input(s): PROBNP in the last 8760 hours. BMP Latest Ref Rng &  Units 11/02/2021 11/01/2021 10/31/2021  Glucose 70 - 99 mg/dL 82 108(H) 120(H)  BUN 8 - 23 mg/dL 24(H) 18 17  Creatinine 0.44 - 1.00 mg/dL 1.84(H) 1.64(H) 1.59(H)  Sodium 135 - 145 mmol/L 127(L) 124(L) 127(L)  Potassium 3.5 - 5.1 mmol/L 4.6 4.1 4.3  Chloride 98 - 111 mmol/L 96(L) 94(L) 96(L)  CO2 22 - 32 mmol/L 19(L) 18(L) 19(L)  Calcium 8.9 - 10.3 mg/dL 8.8(L) 8.8(L) 9.0   Hepatic Function Latest Ref Rng & Units 10/31/2021 09/30/2021 09/26/2021  Total Protein 6.5 - 8.1 g/dL 6.5 5.6(L) 5.1(L)  Albumin 3.5 - 5.0 g/dL 3.7 2.4(L) 2.2(L)  AST 15 - 41 U/L 20 21 33  ALT 0 - 44 U/L 13 30 114(H)  Alk Phosphatase 38 - 126 U/L 67 114 191(H)  Total Bilirubin 0.3 - 1.2 mg/dL 0.7 0.8 0.8  Bilirubin, Direct 0.0 - 0.2 mg/dL - - -   CBC Latest Ref Rng & Units 11/02/2021 10/31/2021 10/02/2021  WBC 4.0 - 10.5 K/uL 11.1(H) 9.7 11.6(H)  Hemoglobin 12.0 - 15.0 g/dL 9.4(L) 9.8(L) 8.9(L)  Hematocrit 36.0 - 46.0 % 26.9(L) 29.0(L) 26.6(L)  Platelets 150 - 400 K/uL 270 274 519(H)   BNP (last 3 results) Recent Labs    06/19/21 0319 10/31/21 0919  BNP 163.9* 525.0*    ProBNP (last 3 results) No results for input(s): PROBNP in the last 8760 hours.  Lipid Panel  No results found for: CHOL, TRIG, HDL, CHOLHDL, VLDL, LDLCALC, LDLDIRECT Cardiac Panel (last 3 results) No results for input(s): CKTOTAL, CKMB, TROPONINI, RELINDX in the last 72 hours.  HEMOGLOBIN A1C No results found for: HGBA1C, MPG TSH Recent Labs    06/19/21 0319 10/31/21 0919  TSH 1.872 1.355   Imaging: DG Chest Portable 1 View  Result Date: 10/31/2021 CLINICAL DATA:  Shortness of breath.  Assess for pulmonary edema. EXAM: PORTABLE CHEST 1 VIEW COMPARISON:  September 24, 2021 FINDINGS: The heart size is enlarged. Mediastinal contour is normal. There is increased pulmonary interstitium bilaterally. Probable small left pleural effusion is identified. There is no focal pneumonia. No acute abnormality is identified in the osseous  structures. IMPRESSION: Congestive heart failure. Electronically Signed   By: Abelardo Diesel M.D.   On: 10/31/2021 09:22    Cardiac Studies:  EKG:   Echocardiogram 09/24/2021:    1. Left ventricular ejection fraction, by estimation, is 55 to 60%. The left ventricle has normal function. The left ventricle has no regional wall motion abnormalities.  2. Right ventricular systolic function is normal. The right ventricular size is normal.  3. Left atrial size was mildly dilated.  4. Mild mitral valve regurgitation. There is moderate holosystolic prolapse of the middle scallop of the posterior leaflet of the mitral valve.  5. The aortic valve is calcified. Aortic valve regurgitation is not visualized.   Conclusion(s)/Recommendation(s): No evidence of valvular vegetations on this transthoracic echocardiogram. Would recommend a transesophageal echocardiogram to exclude infective endocarditis if clinically indicated.   Carotid artery duplex 07/06/2021: Duplex suggests stenosis in the right internal carotid artery (1-15%). Duplex suggests stenosis in the left internal carotid artery (16-49%). Duplex suggests stenosis in the left external carotid artery (<50%). Antegrade right vertebral artery flow. Antegrade left vertebral artery flow. Follow up in one year is appropriate if clinically indicated.   Scheduled Meds:  aspirin EC  81 mg Oral Daily   atorvastatin  20 mg Oral Daily   Chlorhexidine Gluconate Cloth  6 each Topical Daily   DULoxetine  20 mg Oral Daily   enoxaparin (LOVENOX) injection  30 mg Subcutaneous Q24H   furosemide  80 mg Intravenous BID   gabapentin  100 mg Oral QHS   magnesium oxide  400 mg Oral BID   pantoprazole  40 mg Oral Daily   sodium chloride flush  3 mL Intravenous Q12H   sodium chloride  1 g Oral BID WC   tamsulosin  0.4 mg Oral QPC supper   trimethoprim  100 mg Oral Daily   Continuous Infusions:  sodium chloride     DOBUTamine     PRN Meds:.sodium chloride,  acetaminophen, albuterol, ALPRAZolam, hydrALAZINE, LORazepam, melatonin, ondansetron (ZOFRAN) IV, sodium chloride flush  Assessment/Plan:   Deborah Jordan  is a 87 y.o. with hypertension, hyperlipidemia, history of chronic kidney disease, hyperglycemia, bifascicular block on EKG, chronic palpitations which are improved with beta-blocker therapy.  She was last admitted in July 2022 with hyponatremia due to excessive fluid intake as she was having dysphagia for solid foods.  She has recuperated well from this, again admitted in October 2022 a month ago for urosepsis.  Due to marked asymptomatic sinus bradycardia, on 10/28/2021, carvedilol discontinued and I reduced the dose of amlodipine from 10 mg to 5 mg daily.  Patient now admitted with acute respiratory distress and  Mobitz 2 AV block.  1.  Acute respiratory distress 2.  Complete heart block this morning on telemetry 3.  Acute renal failure stage IIIb 4.  Chronic pulmonary infiltrates 5.  Primary hypertension  Recommendations: Patient is in acute respiratory distress, I am not completely convinced that this is all acute pulmonary edema from mild MR topmost moderate MR on physical exam secondary to mitral valve prolapse.  In fact if it was valvular heart disease related, symptom would have gotten better with bradycardia in view of reduced MR.  I will set her up for right IJ access site right heart catheterization and in view of complete heart block, I will place a temporary transvenous pacemaker to see if her symptoms will improve.  Right heart catheterization and also help with determination of her respiratory distress if it is related to pulmonary hypertension, heart failure versus noncardiac etiology.  Patient is acutely ill with acute respiratory distress, will need to be transferred to ICU.  She is also in complete heart block and acute renal failure.  However inpatient mortality is extremely high.  I reviewed the CT scans of the chest  that was done previously, chest x-ray that was done previously, again I have a suspicion that there could be a pulmonary etiology that is ongoing including potential for atypical pneumonia versus pulm fibrosis.  This is a 1 hour critical care time, I spent 36 minutes in evaluation of the patient complex medical issues, discussions with the EP regarding complete heart block and coordination of care.  Discontinue furosemide for now until we know the etiology, permissive hypertension is okay with me for now.  I have discussed with the patient regarding right heart catheterization and possible need for temporary pacemaker insertion.  I will also make arrangement for transfer to the ICU.  I called her daughter Ms. Gwen and discussed with cardiac catheterization including cardiac arrest and need for CPR and respiratory failure as well, she is aware of the risks.    Adrian Prows, MD, [facility] 11/02/2021, 8:17 AM Office: [phone] Fax: [phone] Pager: [phone]

## 2021-11-02 NOTE — Progress Notes (Signed)
Attempted giving pt a break of Bipap.  Pt noted to have Increased WOB. Pt placed back on documented Bipap settings. Pt tolerating well.,

## 2021-11-02 NOTE — Consult Note (Addendum)
Advanced Heart Failure Team Consult Note   Primary Physician: [name], MD PCP-Cardiologist:  Dr. Einar Gip   Reason for Consultation: Acute on Chronic Diastolic CHF w/ Severe MR   HPI:    Deborah Jordan is seen today for evaluation of acute on chronic diastolic CHF and severe MR at the request of Dr. Einar Gip, Cardiology.   Echo 04/2017 EF 55-60%, G1DD, No MR  Recent admission last month, 10/22, for septic shock 2/2 E. Coli and strep infantarius bacteremia. 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 VVS was consulted but felt no indication for vascular intervention.   Given bacteremia, she had a TTE prior admit that showed normal LVEF 55-60% w/ moderate holosystolic prolapse of the middle scallop of the posterior leaflet of the mitral valve w/ mild regurgitation. Per chart review, it appears she did not get a TTE to r/o exclude infective endocarditis. Repeat blood cultures on 10/7 showed no growth. Continued on abx at discharge w/ plans to continue x 14 days w/ end date 10/21. Also of note, hospitalization was c/b acute hypoxic respiratory failure/ acute pulmonary edema requiring intubation and IV Lasix. She was discharged 10/17 to SNF.   After discharge, she apparently was diagnosed w UTI and treated w/ bactrim.   Presented back to the ED on 11/13 complaints of SOB, low HR and decreased urination. Found to be bradycardic w/ HR in the 30s, hypotensive, hypoxic, in acute HF and w/ AKI. BNP 525 CXR w/ mild edema and small left pleural effusion. SCr elevated at 1.66 on admit (prior baseline 0.65). WBC 9.7 on admit, up to 11.1 today. Repeat BCx pending. She was started on IV Lasix. ? blocker discontinued. EP consulted and following for potential PPM, 2:1 AV block. Will need negative blood cx before implant. HS trop 31>>20   Had RHC today w/ placement of  TPW. Findings c/w acute decompensated heart failure with pulmonary edema secondary to severe mitral regurgitation.  RA: 21/20, mean 16 mmHg. RV 46/16, EDP 22 mmHg. PA 61/18, mean 35 mmHg.  PA saturation 72%. PW 43/22, mean 23 mmHg.  Giant V waves noted suggestive of severe MR. CO 7.49, CI 4.33 by Fick.  QP/QS 1.00. TPVR 8.08 Wood units.  PVR 2.77 Wood units. SVR 900 dynes.  She has been transferred to ICU and started on milrinone for afterload reduction. Repeat echo pending. IV Lasix increased to 80 mg bid.    Echo 09/24/21 left ventricular ejection fraction, by estimation, is 55 to 60%. The left ventricle has normal function. The left ventricle has no regional wall motion abnormalities. 1. 2. Right ventricular systolic function is normal. The right ventricular size is normal. 3. Left atrial size was mildly dilated. Mild mitral valve regurgitation. There is moderate holosystolic prolapse of the middle scallop of the posterior leaflet of the mitral valve. 4. 5. The aortic valve is calcified. Aortic valve regurgitation is not visualized. Conclusion(s)/Recommendation(s): No evidence of valvular vegetations on this transthoracic echocardiogram. Would recommend a transesophageal echocardiogram to exclude infective endocarditis if clinically indicated.  Review of Systems: [y] = yes, '[ ]'  = no   General: Weight gain '[ ]' ; Weight loss '[ ]' ; Anorexia '[ ]' ; Fatigue '[ ]' ; Fever '[ ]' ; Chills '[ ]' ; Weakness '[ ]'   Cardiac: Chest pain/pressure '[ ]' ; Resting SOB [ Y]; Exertional SOB [ Y]; Orthopnea '[ ]' ; Pedal Edema '[ ]' ; Palpitations '[ ]' ;  Syncope '[ ]' ; Presyncope '[ ]' ; Paroxysmal nocturnal dyspnea'[ ]'   Pulmonary: Cough '[ ]' ; Wheezing'[ ]' ; Hemoptysis'[ ]' ; Sputum '[ ]' ; Snoring '[ ]'   GI: Vomiting'[ ]' ; Dysphagia'[ ]' ; Melena'[ ]' ; Hematochezia '[ ]' ; Heartburn'[ ]' ; Abdominal pain '[ ]' ; Constipation '[ ]' ; Diarrhea '[ ]' ; BRBPR '[ ]'   GU: Hematuria'[ ]' ; Dysuria '[ ]' ; Nocturia'[ ]'   Vascular: Pain in legs with walking '[ ]' ; Pain in feet with  lying flat '[ ]' ; Non-healing sores '[ ]' ; Stroke '[ ]' ; TIA '[ ]' ; Slurred speech '[ ]' ;  Neuro: Headaches'[ ]' ; Vertigo'[ ]' ; Seizures'[ ]' ; Paresthesias'[ ]' ;Blurred vision '[ ]' ; Diplopia '[ ]' ; Vision changes '[ ]'   Ortho/Skin: Arthritis '[ ]' ; Joint pain '[ ]' ; Muscle pain '[ ]' ; Joint swelling '[ ]' ; Back Pain '[ ]' ; Rash '[ ]'   Psych: Depression'[ ]' ; Anxiety'[ ]'   Heme: Bleeding problems '[ ]' ; Clotting disorders '[ ]' ; Anemia '[ ]'   Endocrine: Diabetes '[ ]' ; Thyroid dysfunction'[ ]'   Home Medications Prior to Admission medications   Medication Sig Start Date End Date Taking? Authorizing Provider  acetaminophen (TYLENOL) 650 MG CR tablet Take 650 mg by mouth every 8 (eight) hours as needed for pain.   Yes [provider]  ALPRAZolam (XANAX) 0.25 MG tablet Take 1 tablet (0.25 mg total) by mouth 3 (three) times daily as needed for anxiety. 10/04/21  Yes Pahwani, Einar Grad, MD  amLODipine (NORVASC) 10 MG tablet Take 0.5 tablets (5 mg total) by mouth daily. 10/29/21  Yes Adrian Prows, MD  aspirin EC 81 MG tablet Take 81 mg by mouth daily. Swallow whole.   Yes [provider]  atorvastatin (LIPITOR) 20 MG tablet Take 1 tablet (20 mg total) by mouth daily. 10/04/21 11/03/21 Yes Pahwani, Einar Grad, MD  Cholecalciferol (VITAMIN D) 2000 units CAPS Take 2,000 Units by mouth daily.   Yes [provider]  DULoxetine (CYMBALTA) 20 MG capsule Take 1 capsule (20 mg total) by mouth daily. 06/03/21  Yes Jessy Oto, MD  esomeprazole (NEXIUM) 40 MG capsule Take 40 mg by mouth daily.   Yes [provider]  gabapentin (NEURONTIN) 100 MG capsule Take 1 capsule (100 mg total) by mouth at bedtime. 06/03/21  Yes Jessy Oto, MD  sodium chloride 1 g tablet Take 1 g by mouth 2 (two) times daily with a meal.   Yes [provider]  sulfamethoxazole-trimethoprim (BACTRIM DS) 800-160 MG tablet Take 1 tablet by mouth 2 (two) times daily. 10/29/21  Yes [provider]  tamsulosin (FLOMAX) 0.4 MG CAPS capsule Take 1  capsule (0.4 mg total) by mouth daily after supper. 10/04/21 11/03/21 Yes Pahwani, Einar Grad, MD  trimethoprim (TRIMPEX) 100 MG tablet Take 100 mg by mouth daily.   Yes [provider]  loratadine (CLARITIN) 10 MG tablet Take 10 mg by mouth daily.    [provider]    Past Medical History: Past Medical History:  Diagnosis Date   Anxiety    Arthritis    osteoarthritis. spinal stenosis. Scoliosis of spine-degenerative spine.   Bilateral cataracts    CAD (coronary artery disease)    minimal, improved on right 06/2016   Chronic kidney disease    STAGE 4   DDD (degenerative disc disease), lumbar    Dyspnea    Elevated cholesterol    GERD (gastroesophageal reflux disease)    controls with Nexium   Grade I diastolic dysfunction 27/05/2375   Noted on ECHO   History of cardiomegaly    History of gallstones    Hypertension  LVH (left ventricular hypertrophy) 05/04/2017   Mil, noted on ECHO   Mild depression    Pre-diabetes    RBBB (right bundle branch block)    Spinal stenosis    Tubular adenoma    and benign polyps   Vitamin D deficiency    Wears partial dentures     Past Surgical History: Past Surgical History:  Procedure Laterality Date   CATARACT EXTRACTION Bilateral 03/06/2013   CHOLECYSTECTOMY     COLONOSCOPY     CRYOTHERAPY     DILATION AND CURETTAGE OF UTERUS     esi     HEMORRHOID SURGERY N/A 09/21/2018   Procedure: SINGLE COLUMN HEMORRHOIDECTOMY, HEMORRHOIDPEXY;  Surgeon: Leighton Ruff, MD;  Location: [facility];  Service: General;  Laterality: N/A;   RIGHT HEART CATH N/A 11/02/2021   Procedure: RIGHT HEART CATH;  Surgeon: Adrian Prows, MD;  Location: [facility];  Service: Cardiovascular;  Laterality: N/A;   TEMPORARY PACEMAKER N/A 11/02/2021   Procedure: TEMPORARY PACEMAKER;  Surgeon: Adrian Prows, MD;  Location: [facility];  Service: Cardiovascular;  Laterality: N/A;   TOTAL HIP ARTHROPLASTY Left 03/08/2013    Procedure: LEFT TOTAL HIP ARTHROPLASTY ANTERIOR APPROACH;  Surgeon: Mcarthur Rossetti, MD;  Location: [facility];  Service: Orthopedics;  Laterality: Left;    Family History: Family History  Problem Relation Age of Onset   Heart disease Father    Heart failure Father    Diabetes Father    Atrial fibrillation Sister     Social History: Social History   Socioeconomic History   Marital status: Widowed    Spouse name: Not on file   Number of children: 2   Years of education: Not on file   Highest education level: Not on file  Occupational History   Not on file  Tobacco Use   Smoking status: Never   Smokeless tobacco: Never  Vaping Use   Vaping Use: Never used  Substance and Sexual Activity   Alcohol use: Yes    Comment: OCC   Drug use: Never   Sexual activity: Not Currently    Birth control/protection: Post-menopausal  Other Topics Concern   Not on file  Social History Narrative   Not on file   Social Determinants of Health   Financial Resource Strain: Not on file  Food Insecurity: Not on file  Transportation Needs: Not on file  Physical Activity: Not on file  Stress: Not on file  Social Connections: Not on file    Allergies:  Allergies  Allergen Reactions   Ace Inhibitors Cough   Hydrochlorothiazide Other (See Comments)    Low Sodium    Objective:    Vital Signs:   Temp:  [97.7 F (36.5 C)-98.6 F (37 C)] 98.3 F (36.8 C) (11/15 0757) Pulse Rate:  [0-170] 79 (11/15 1200) Resp:  [0-54] 23 (11/15 1200) BP: (120-187)/(35-108) 175/81 (11/15 1200) SpO2:  [0 %-100 %] 94 % (11/15 1200) FiO2 (%):  [40 %] 40 % (11/15 0911) Weight:  [82.6 kg] 82.6 kg (11/15 0513) Last BM Date: 10/29/21  Weight change: Filed Weights   10/31/21 1423 11/01/21 0332 11/02/21 0513  Weight: 83.9 kg 83.3 kg 82.6 kg    Intake/Output:   Intake/Output Summary (Last 24 hours) at 11/02/2021 1414 Last data filed at 11/02/2021 1204 Gross per 24 hour  Intake 800 ml  Output 450  ml  Net 350 ml      Physical Exam    General:  Well appearing  elderly WF, obese, on Bipap.  HEENT: normal Neck: supple. JVP to jaw . Rt IJ TVP Carotids 2+ bilat; no bruits. No lymphadenopathy or thyromegaly appreciated. Cor: PMI nondisplaced. Regular rate & rhythm. No rubs, gallops or murmurs. Lungs: clear Abdomen: soft, nontender, nondistended. No hepatosplenomegaly. No bruits or masses. Good bowel sounds. Extremities: no cyanosis, clubbing, rash, edema Neuro: alert & orientedx3, cranial nerves grossly intact. moves all 4 extremities w/o difficulty. Affect pleasant   Telemetry   Currently V -paced 80s   EKG    2:1 AVB HR 40s   Labs   Basic Metabolic Panel: Recent Labs  Lab 10/31/21 0919 10/31/21 1205 10/31/21 1448 11/01/21 0405 11/02/21 0338  NA 124*  --  127* 124* 127*  K 3.6  --  4.3 4.1 4.6  CL 91*  --  96* 94* 96*  CO2 20*  --  19* 18* 19*  GLUCOSE 119*  --  120* 108* 82  BUN 17  --  17 18 24*  CREATININE 1.66*  --  1.59* 1.64* 1.84*  CALCIUM 9.1  --  9.0 8.8* 8.8*  MG  --  1.3*  --  1.7 2.1    Liver Function Tests: Recent Labs  Lab 10/31/21 0919  AST 20  ALT 13  ALKPHOS 67  BILITOT 0.7  PROT 6.5  ALBUMIN 3.7   No results for input(s): LIPASE, AMYLASE in the last 168 hours. Recent Labs  Lab 10/31/21 0919  AMMONIA 10    CBC: Recent Labs  Lab 10/31/21 0919 11/02/21 0338  WBC 9.7 11.1*  NEUTROABS 7.6 8.7*  HGB 9.8* 9.4*  HCT 29.0* 26.9*  MCV 86.1 84.1  PLT 274 270    Cardiac Enzymes: No results for input(s): CKTOTAL, CKMB, CKMBINDEX, TROPONINI in the last 168 hours.  BNP: BNP (last 3 results) Recent Labs    06/19/21 0319 10/31/21 0919  BNP 163.9* 525.0*    ProBNP (last 3 results) No results for input(s): PROBNP in the last 8760 hours.   CBG: Recent Labs  Lab 10/31/21 1610  GLUCAP 117*    Coagulation Studies: Recent Labs    10/31/21 0919  LABPROT 14.6  INR 1.1     Imaging   CARDIAC  CATHETERIZATION  Result Date: 11/02/2021 Right heart catheterization and temporary transvenous pacemaker implantation via right IJ 11/02/2021: RA: 21/20, mean 16 mmHg. RV 46/16, EDP 22 mmHg. PA 61/18, mean 35 mmHg.  PA saturation 72%. PW 43/22, mean 23 mmHg.  Giant V waves noted suggestive of severe MR. CO 7.49, CI 4.33 by Fick.  QP/QS 1.00. TPVR 8.08 Wood units.  PVR 2.77 Wood units. SVR 900 dynes. Temporary transvenous pacemaker placed into the right ventricular apex, capture obtained at 1 mV.  30 cm from IJ, settings at 80 bpm asynchronous at 5 mA. Impression: Findings are consistent with acute decompensated heart failure with pulmonary edema secondary to severe mitral regurgitation.  We will transfer to the unit and try to diurese her and use vasodilator therapy.  If she does not respond, we may have to consider mechanical support.     Medications:     Current Medications:  aspirin EC  81 mg Oral Daily   Chlorhexidine Gluconate Cloth  6 each Topical Daily   DULoxetine  20 mg Oral Daily   enoxaparin (LOVENOX) injection  30 mg Subcutaneous Q24H   furosemide  80 mg Intravenous [id number]   gabapentin  100 mg Oral QHS   magnesium oxide  400 mg Oral BID   pantoprazole  40 mg Oral Daily   sodium chloride flush  3 mL Intravenous Q12H   sodium chloride flush  3 mL Intravenous Q12H   sodium chloride flush  3 mL Intravenous Q12H   sodium chloride  1 g Oral BID WC   tamsulosin  0.4 mg Oral QPC supper   trimethoprim  100 mg Oral Daily    Infusions:  sodium chloride     sodium chloride     [START ON 11/03/2021] sodium chloride     sodium chloride     milrinone 0.25 mcg/kg/min (11/02/21 1204)    Assessment/Plan   Mitral Regurgitation  - TTE  done 10/22 for bacteremia showed moderate holosystolic prolapse of the middle scallop of the posterior leaflet of the mitral valve w/ mild regurgitation. Did not get TEE - Now w/ admitted w/ a/c CHF/ pulmonary edema w/ RHC suggestive of severe MR  (large V waves) - Repeat TTE ordered - ? If sequela from recent bacteremia. Infective endocarditis was never excluded. Needs definitive TEE to better define the valve - now off abx. Repeat blood cultures pending  - cannot exclude ischemic MR. Conduction dz/bradycardia, know CAD and PAD. No LHC yet w/ AKI  - milrinone for afterload reduction  - May need to return to cath lab for swan placement for continuous hemodynamic monitoring. Labs suggestive of shock but CO/CI ok on RHC, CO 7.49, CI 4.33 by Fick. Will repeat lactic acid  - will need to insure that bacteremia has cleared prior to consideration for potential MitraClip   2. Acute on Chronic Diastolic Heart Failure - Echo 04/2017 EF 55-60%, G1DD, No MR - Echo 10/22 EF 55-60% - RHC today mPCWP 23, RA 16. Normal CO  - Diureses w/ IV Lasix 80 mg bid  - Milrinone for afterload reduction given severe MR - off ? blocker w/ bradycardia   3. AKI on IIIa CKD  - Baseline SCr 0.6 - 1.66 on admit  - suspect cardiorenal from MR  - monitor w/ diuresis   4. 2:1 AVB - now off ? blocker - TVPW placed today  - EP has seen, will need negative Blood cx prior to possible PPM implant   5. Hypervolemic Hyponatremia - Na 127 - diuresis and fluid restrict    Length of Stay: 2  Deborah Jester, PA-C  11/02/2021, 2:14 PM  Advanced Heart Failure Team Pager [phone] (M-F; 7a - 5p)  Please contact [facility] for night-coverage after hours (4p -7a ) and weekends on [email or web address]   Agree with above.   87 y/o woman chronic diastolic HF. Recently admitted with e.coli and strep bovis bacteremia in 10/22. Now readmitted with acute pulmonary edema and respiratory failure requiring bipap.   RHC with marked v-waves (40) suggestive of severe MR. Also developed CHB and required TVP. Started on milrinone and IV lasix.   Echo today shows EF 60-65% Mild MR. RV ok   General:  Elderly woman on bipap  HEENT: normal Neck: supple. JVP to jaw  RIJ TVP Carotids  2+ bilat; no bruits. No lymphadenopathy or thryomegaly appreciated. Cor: PMI nondisplaced. Regular rate & rhythm. No rubs, gallops or murmurs. Lungs: + crackles  Abdomen: soft, nontender, nondistended. No hepatosplenomegaly. No bruits or masses. Good bowel sounds. Extremities: no cyanosis, clubbing, rash, edema Neuro: alert & orientedx3, cranial nerves grossly intact. moves all 4 extremities w/o difficulty. Affect pleasant  RHC concerning for severe MR but echo doesn't support. Given recent bacteremia and heart block I think she needs TEE to further  evaluate if she is a candidate. Would check Bcx and ESR. Continue milrinone and IV lasix. We will follow.   CRITICAL CARE Performed by: Glori Bickers  Total critical care time: 45 minutes  Critical care time was exclusive of separately billable procedures and treating other patients.  Critical care was necessary to treat or prevent imminent or life-threatening deterioration.  Critical care was time spent personally by me (independent of midlevel providers or residents) on the following activities: development of treatment plan with patient and/or surrogate as well as nursing, discussions with consultants, evaluation of patient's response to treatment, examination of patient, obtaining history from patient or surrogate, ordering and performing treatments and interventions, ordering and review of laboratory studies, ordering and review of radiographic studies, pulse oximetry and re-evaluation of patient's condition.  Glori Bickers, MD  7:10 PM

## 2021-11-02 NOTE — Progress Notes (Signed)
Electrophysiology Rounding Note  Patient Name: Deborah Jordan Date of Encounter: 11/02/2021  Primary Cardiologist: Dr. Einar Gip Electrophysiologist: New to Dr. Quentin Ore   Subjective   The patient remains in significant respiratory distress despite BiPAP. More alert today, but tachypnea and orthopnea with accessory muscle use.   Inpatient Medications    Scheduled Meds:  aspirin EC  81 mg Oral Daily   atorvastatin  20 mg Oral Daily   Chlorhexidine Gluconate Cloth  6 each Topical Daily   DULoxetine  20 mg Oral Daily   enoxaparin (LOVENOX) injection  30 mg Subcutaneous Q24H   furosemide  80 mg Intravenous BID   gabapentin  100 mg Oral QHS   magnesium oxide  400 mg Oral BID   pantoprazole  40 mg Oral Daily   sodium chloride flush  3 mL Intravenous Q12H   sodium chloride flush  3 mL Intravenous Q12H   sodium chloride  1 g Oral BID WC   tamsulosin  0.4 mg Oral QPC supper   trimethoprim  100 mg Oral Daily   Continuous Infusions:  sodium chloride     DOBUTamine     PRN Meds: sodium chloride, acetaminophen, albuterol, hydrALAZINE, LORazepam, melatonin, ondansetron (ZOFRAN) IV, sodium chloride flush   Vital Signs    Vitals:   11/02/21 0452 11/02/21 0513 11/02/21 0742 11/02/21 0757  BP:  (!) 157/48  (!) 187/42  Pulse:  (!) 43 (!) 58 (!) 41  Resp: (!) 28 (!) 22 (!) 21 (!) 25  Temp:  97.7 F (36.5 C)  98.3 F (36.8 C)  TempSrc:    Oral  SpO2: 99% 95% 97% 93%  Weight:  82.6 kg    Height:        Intake/Output Summary (Last 24 hours) at 11/02/2021 0843 Last data filed at 11/02/2021 0600 Gross per 24 hour  Intake --  Output 450 ml  Net -450 ml   Filed Weights   10/31/21 1423 11/01/21 0332 11/02/21 0513  Weight: 83.9 kg 83.3 kg 82.6 kg    Physical Exam    GEN- The patient is acutely ill appearing with SOB.  Head- normocephalic, atraumatic Eyes-  Sclera clear, conjunctiva pink Ears- hearing intact Oropharynx- clear Neck- supple Lungs- Diminished with rales,  increased work of breathing on BiPAP Heart-  Slow and irregular  GI- soft, NT, ND, + BS Extremities- no clubbing or cyanosis.  Skin- no rash or lesion Psych- flat affect  Labs    CBC Recent Labs    10/31/21 0919 11/02/21 0338  WBC 9.7 11.1*  NEUTROABS 7.6 8.7*  HGB 9.8* 9.4*  HCT 29.0* 26.9*  MCV 86.1 84.1  PLT 274 191   Basic Metabolic Panel Recent Labs    11/01/21 0405 11/02/21 0338  NA 124* 127*  K 4.1 4.6  CL 94* 96*  CO2 18* 19*  GLUCOSE 108* 82  BUN 18 24*  CREATININE 1.64* 1.84*  CALCIUM 8.8* 8.8*  MG 1.7 2.1   Liver Function Tests Recent Labs    10/31/21 0919  AST 20  ALT 13  ALKPHOS 67  BILITOT 0.7  PROT 6.5  ALBUMIN 3.7   No results for input(s): LIPASE, AMYLASE in the last 72 hours. Cardiac Enzymes No results for input(s): CKTOTAL, CKMB, CKMBINDEX, TROPONINI in the last 72 hours.   Telemetry    2:1 AV block in 40-50s, this am is in CHB with escape in 40-50s (personally reviewed)  Radiology    DG Chest Portable 1 View  Result Date:  10/31/2021 CLINICAL DATA:  Shortness of breath.  Assess for pulmonary edema. EXAM: PORTABLE CHEST 1 VIEW COMPARISON:  September 24, 2021 FINDINGS: The heart size is enlarged. Mediastinal contour is normal. There is increased pulmonary interstitium bilaterally. Probable small left pleural effusion is identified. There is no focal pneumonia. No acute abnormality is identified in the osseous structures. IMPRESSION: Congestive heart failure. Electronically Signed   By: Abelardo Diesel M.D.   On: 10/31/2021 09:22    Patient Profile     Deborah Jordan is a 87 y.o. female with a history of HTN, CKD, HLD, bifascicular block, and hyponatremia who is being seen today for the evaluation of bradycardia and heart failure at the request of Dr. Telford Nab.  Assessment & Plan    1.  Advanced AV block including intermittent CHB With at least bifascicular block at baseline.  She may be a poor candidate for transvenous pacing with  recurrent UTIs and recent urosepsis. This does not appear to be the driver of her respiratory issues.  Plan for RHC today with Dr. Einar Gip +/- temp pacer   2. Acute on chronic diastolic CHF Volume status remains elevated despite attempts at diuresis RHC today per Dr. Einar Gip   3. Acute respiratory failure She is more alert today on BiPAP but remains significantly SOB.   Her prognosis is guarded. She is currently critically ill with a high chance of decompensation. EP will continue to follow along her course.   For questions or updates, please contact [facility] Please consult [email or web address] for contact info under Cardiology/STEMI.  Signed, Shirley Friar, PA-C  11/02/2021, 8:43 AM

## 2021-11-02 NOTE — Progress Notes (Signed)
Saw transthoracic echocardiogram images at bedside. Hyperdynamic LV. No significant MR. Diastolic function assessment limited due to paced rhythm. Increased LVOT gradient. I reduced temporary pacer rate from 80 bpm to 60 bpm. No significant change noted in LVOT gradient (around 1.6-1.8 m/sec). I left pacer rate at 60 bpm, I do not think she needs rate higher than that at rest.  Full echocardiogram report to follow.  CRITICAL CARE Performed by: Vernell Leep   Total critical care time: 35 minutes   Critical care time was exclusive of separately billable procedures and treating other patients.   Critical care was necessary to treat or prevent imminent or life-threatening deterioration.   Critical care was time spent personally by me on the following activities: development of treatment plan with patient and/or surrogate as well as nursing, discussions with consultants, evaluation of patient's response to treatment, examination of patient, obtaining history from patient or surrogate, ordering and performing treatments and interventions, ordering and review of laboratory studies, ordering and review of radiographic studies, pulse oximetry and re-evaluation of patient's condition.      Nigel Mormon, MD Pager: [phone] Office: [phone]

## 2021-11-02 NOTE — Progress Notes (Addendum)
Subjective:  Patient in mild respiratory distress, unable to lay down, presently on BiPAP.  He is alert and oriented x3, responds appropriately.  Intake/Output from previous day:  I/O last 3 completed shifts: In: 480 [P.O.:480] Out: 800 [Urine:800] No intake/output data recorded.  Blood pressure (!) 187/42, pulse (!) 41, temperature 98.3 F (36.8 C), temperature source Oral, resp. rate (!) 25, height _0  (1.448 m), weight 82.6 kg, SpO2 93 %.  Filed Weights   10/31/21 1423 11/01/21 0332 11/02/21 0513  Weight: 83.9 kg 83.3 kg 82.6 kg     Vitals with BMI 11/02/2021 11/02/2021 11/02/2021  Height - - -  Weight - - 182 lbs 2 oz  BMI - - 32.9  Systolic 924 - 268  Diastolic 42 - 48  Pulse 41 58 43    Physical Exam Constitutional:      General: She is in acute distress.     Appearance: She is obese. She is ill-appearing.     Interventions: Face mask in place.  Eyes:     Extraocular Movements: Extraocular movements intact.  Neck:     Vascular: No carotid bruit or JVD.  Cardiovascular:     Heart sounds: Murmur heard.  Crescendo-decrescendo mid to late systolic murmur is present with a grade of 3/6 at the apex.  Pulmonary:     Effort: Tachypnea, accessory muscle usage and respiratory distress present.     Breath sounds: Decreased air movement present. Rales (diffuse bilateral scattered) present.  Abdominal:     General: Abdomen is flat. Bowel sounds are normal.     Palpations: Abdomen is soft.  Musculoskeletal:     Right lower leg: No edema.     Left lower leg: No edema.  Skin:    General: Skin is warm.     Capillary Refill: Capillary refill takes less than 2 seconds.  Neurological:     General: No focal deficit present.     Mental Status: She is alert and oriented to person, place, and time.  Psychiatric:        Behavior: Behavior is cooperative.    Lab Results: ProBNP (last 3 results) No results for input(s): PROBNP in the last 8760 hours. BMP Latest Ref Rng &  Units 11/02/2021 11/01/2021 10/31/2021  Glucose 70 - 99 mg/dL 82 108(H) 120(H)  BUN 8 - 23 mg/dL 24(H) 18 17  Creatinine 0.44 - 1.00 mg/dL 1.84(H) 1.64(H) 1.59(H)  Sodium 135 - 145 mmol/L 127(L) 124(L) 127(L)  Potassium 3.5 - 5.1 mmol/L 4.6 4.1 4.3  Chloride 98 - 111 mmol/L 96(L) 94(L) 96(L)  CO2 22 - 32 mmol/L 19(L) 18(L) 19(L)  Calcium 8.9 - 10.3 mg/dL 8.8(L) 8.8(L) 9.0   Hepatic Function Latest Ref Rng & Units 10/31/2021 09/30/2021 09/26/2021  Total Protein 6.5 - 8.1 g/dL 6.5 5.6(L) 5.1(L)  Albumin 3.5 - 5.0 g/dL 3.7 2.4(L) 2.2(L)  AST 15 - 41 U/L 20 21 33  ALT 0 - 44 U/L 13 30 114(H)  Alk Phosphatase 38 - 126 U/L 67 114 191(H)  Total Bilirubin 0.3 - 1.2 mg/dL 0.7 0.8 0.8  Bilirubin, Direct 0.0 - 0.2 mg/dL - - -   CBC Latest Ref Rng & Units 11/02/2021 10/31/2021 10/02/2021  WBC 4.0 - 10.5 K/uL 11.1(H) 9.7 11.6(H)  Hemoglobin 12.0 - 15.0 g/dL 9.4(L) 9.8(L) 8.9(L)  Hematocrit 36.0 - 46.0 % 26.9(L) 29.0(L) 26.6(L)  Platelets 150 - 400 K/uL 270 274 519(H)   BNP (last 3 results) Recent Labs    06/19/21 0319 10/31/21 0919  BNP 163.9* 525.0*    ProBNP (last 3 results) No results for input(s): PROBNP in the last 8760 hours.  Lipid Panel  No results found for: CHOL, TRIG, HDL, CHOLHDL, VLDL, LDLCALC, LDLDIRECT Cardiac Panel (last 3 results) No results for input(s): CKTOTAL, CKMB, TROPONINI, RELINDX in the last 72 hours.  HEMOGLOBIN A1C No results found for: HGBA1C, MPG TSH Recent Labs    06/19/21 0319 10/31/21 0919  TSH 1.872 1.355   Imaging: DG Chest Portable 1 View  Result Date: 10/31/2021 CLINICAL DATA:  Shortness of breath.  Assess for pulmonary edema. EXAM: PORTABLE CHEST 1 VIEW COMPARISON:  September 24, 2021 FINDINGS: The heart size is enlarged. Mediastinal contour is normal. There is increased pulmonary interstitium bilaterally. Probable small left pleural effusion is identified. There is no focal pneumonia. No acute abnormality is identified in the osseous  structures. IMPRESSION: Congestive heart failure. Electronically Signed   By: Abelardo Diesel M.D.   On: 10/31/2021 09:22    Cardiac Studies:  EKG:   Echocardiogram 09/24/2021:    1. Left ventricular ejection fraction, by estimation, is 55 to 60%. The left ventricle has normal function. The left ventricle has no regional wall motion abnormalities.  2. Right ventricular systolic function is normal. The right ventricular size is normal.  3. Left atrial size was mildly dilated.  4. Mild mitral valve regurgitation. There is moderate holosystolic prolapse of the middle scallop of the posterior leaflet of the mitral valve.  5. The aortic valve is calcified. Aortic valve regurgitation is not visualized.   Conclusion(s)/Recommendation(s): No evidence of valvular vegetations on this transthoracic echocardiogram. Would recommend a transesophageal echocardiogram to exclude infective endocarditis if clinically indicated.   Carotid artery duplex 07/06/2021: Duplex suggests stenosis in the right internal carotid artery (1-15%). Duplex suggests stenosis in the left internal carotid artery (16-49%). Duplex suggests stenosis in the left external carotid artery (<50%). Antegrade right vertebral artery flow. Antegrade left vertebral artery flow. Follow up in one year is appropriate if clinically indicated.   Scheduled Meds:  aspirin EC  81 mg Oral Daily   atorvastatin  20 mg Oral Daily   Chlorhexidine Gluconate Cloth  6 each Topical Daily   DULoxetine  20 mg Oral Daily   enoxaparin (LOVENOX) injection  30 mg Subcutaneous Q24H   furosemide  80 mg Intravenous BID   gabapentin  100 mg Oral QHS   magnesium oxide  400 mg Oral BID   pantoprazole  40 mg Oral Daily   sodium chloride flush  3 mL Intravenous Q12H   sodium chloride  1 g Oral BID WC   tamsulosin  0.4 mg Oral QPC supper   trimethoprim  100 mg Oral Daily   Continuous Infusions:  sodium chloride     DOBUTamine     PRN Meds:.sodium chloride,  acetaminophen, albuterol, ALPRAZolam, hydrALAZINE, LORazepam, melatonin, ondansetron (ZOFRAN) IV, sodium chloride flush  Assessment/Plan:   Deborah Jordan  is a 87 y.o. with hypertension, hyperlipidemia, history of chronic kidney disease, hyperglycemia, bifascicular block on EKG, chronic palpitations which are improved with beta-blocker therapy.  She was last admitted in July 2022 with hyponatremia due to excessive fluid intake as she was having dysphagia for solid foods.  She has recuperated well from this, again admitted in October 2022 a month ago for urosepsis.  Due to marked asymptomatic sinus bradycardia, on 10/28/2021, carvedilol discontinued and I reduced the dose of amlodipine from 10 mg to 5 mg daily.  Patient now admitted with acute respiratory distress and  Mobitz 2 AV block.  1.  Acute respiratory distress 2.  Complete heart block this morning on telemetry 3.  Acute renal failure stage IIIb 4.  Chronic pulmonary infiltrates 5.  Primary hypertension  Recommendations: Patient is in acute respiratory distress, I am not completely convinced that this is all acute pulmonary edema from mild MR topmost moderate MR on physical exam secondary to mitral valve prolapse.  In fact if it was valvular heart disease related, symptom would have gotten better with bradycardia in view of reduced MR.  I will set her up for right IJ access site right heart catheterization and in view of complete heart block, I will place a temporary transvenous pacemaker to see if her symptoms will improve.  Right heart catheterization and also help with determination of her respiratory distress if it is related to pulmonary hypertension, heart failure versus noncardiac etiology.  Patient is acutely ill with acute respiratory distress, will need to be transferred to ICU.  She is also in complete heart block and acute renal failure.  However inpatient mortality is extremely high.  I reviewed the CT scans of the chest  that was done previously, chest x-ray that was done previously, again I have a suspicion that there could be a pulmonary etiology that is ongoing including potential for atypical pneumonia versus pulm fibrosis.  This is a 1 hour critical care time, I spent 36 minutes in evaluation of the patient complex medical issues, discussions with the EP regarding complete heart block and coordination of care.  Discontinue furosemide for now until we know the etiology, permissive hypertension is okay with me for now.  I have discussed with the patient regarding right heart catheterization and possible need for temporary pacemaker insertion.  I will also make arrangement for transfer to the ICU.  I called her daughter Ms. Gwen and discussed with cardiac catheterization including cardiac arrest and need for CPR and respiratory failure as well, she is aware of the risks.    Adrian Prows, MD, [facility] 11/02/2021, 8:17 AM Office: [phone] Fax: [phone] Pager: [phone]

## 2021-11-02 NOTE — Progress Notes (Signed)
Pt was taken off of BIPAP and placed on 3 L [place]. Pt is tolerating well at this time and is stable. Rt will monitor.

## 2021-11-03 ENCOUNTER — Inpatient Hospital Stay (HOSPITAL_COMMUNITY): Payer: Medicare Other

## 2021-11-03 DIAGNOSIS — I4430 Unspecified atrioventricular block: Secondary | ICD-10-CM

## 2021-11-03 LAB — CBC
HCT: 23.7 % — ABNORMAL LOW (ref 36.0–46.0)
Hemoglobin: 8.1 g/dL — ABNORMAL LOW (ref 12.0–15.0)
MCH: 28.9 pg (ref 26.0–34.0)
MCHC: 34.2 g/dL (ref 30.0–36.0)
MCV: 84.6 fL (ref 80.0–100.0)
Platelets: 279 10*3/uL (ref 150–400)
RBC: 2.8 MIL/uL — ABNORMAL LOW (ref 3.87–5.11)
RDW: 14.2 % (ref 11.5–15.5)
WBC: 8.3 10*3/uL (ref 4.0–10.5)
nRBC: 0 % (ref 0.0–0.2)

## 2021-11-03 LAB — POCT I-STAT EG7
Acid-base deficit: 3 mmol/L — ABNORMAL HIGH (ref 0.0–2.0)
Bicarbonate: 20.2 mmol/L (ref 20.0–28.0)
Calcium, Ion: 1.19 mmol/L (ref 1.15–1.40)
HCT: 28 % — ABNORMAL LOW (ref 36.0–46.0)
Hemoglobin: 9.5 g/dL — ABNORMAL LOW (ref 12.0–15.0)
O2 Saturation: 72 %
Potassium: 4.3 mmol/L (ref 3.5–5.1)
Sodium: 128 mmol/L — ABNORMAL LOW (ref 135–145)
TCO2: 21 mmol/L — ABNORMAL LOW (ref 22–32)
pCO2, Ven: 29.6 mmHg — ABNORMAL LOW (ref 44.0–60.0)
pH, Ven: 7.442 — ABNORMAL HIGH (ref 7.250–7.430)
pO2, Ven: 36 mmHg (ref 32.0–45.0)

## 2021-11-03 LAB — BASIC METABOLIC PANEL
Anion gap: 10 (ref 5–15)
Anion gap: 11 (ref 5–15)
BUN: 29 mg/dL — ABNORMAL HIGH (ref 8–23)
BUN: 30 mg/dL — ABNORMAL HIGH (ref 8–23)
CO2: 22 mmol/L (ref 22–32)
CO2: 21 mmol/L — ABNORMAL LOW (ref 22–32)
Calcium: 8.5 mg/dL — ABNORMAL LOW (ref 8.9–10.3)
Calcium: 8.6 mg/dL — ABNORMAL LOW (ref 8.9–10.3)
Chloride: 97 mmol/L — ABNORMAL LOW (ref 98–111)
Chloride: 95 mmol/L — ABNORMAL LOW (ref 98–111)
Creatinine, Ser: 1.89 mg/dL — ABNORMAL HIGH (ref 0.44–1.00)
Creatinine, Ser: 2.1 mg/dL — ABNORMAL HIGH (ref 0.44–1.00)
GFR, Estimated: 26 mL/min — ABNORMAL LOW (ref >60)
GFR, Estimated: 23 mL/min — ABNORMAL LOW (ref >60)
Glucose, Bld: 104 mg/dL — ABNORMAL HIGH (ref 70–99)
Glucose, Bld: 117 mg/dL — ABNORMAL HIGH (ref 70–99)
Potassium: 3.4 mmol/L — ABNORMAL LOW (ref 3.5–5.1)
Potassium: 3.9 mmol/L (ref 3.5–5.1)
Sodium: 129 mmol/L — ABNORMAL LOW (ref 135–145)
Sodium: 127 mmol/L — ABNORMAL LOW (ref 135–145)

## 2021-11-03 LAB — HEMOGLOBIN AND HEMATOCRIT, BLOOD
HCT: 25 % — ABNORMAL LOW (ref 36.0–46.0)
Hemoglobin: 8.7 g/dL — ABNORMAL LOW (ref 12.0–15.0)

## 2021-11-03 LAB — COOXEMETRY PANEL
Carboxyhemoglobin: 1.5 % (ref 0.5–1.5)
Methemoglobin: 0.9 % (ref 0.0–1.5)
O2 Saturation: 84.1 %
Total hemoglobin: 7.4 g/dL — ABNORMAL LOW (ref 12.0–16.0)

## 2021-11-03 LAB — BRAIN NATRIURETIC PEPTIDE: B Natriuretic Peptide: 597.9 pg/mL — ABNORMAL HIGH (ref 0.0–100.0)

## 2021-11-03 LAB — SEDIMENTATION RATE: Sed Rate: 15 mm/hr (ref 0–22)

## 2021-11-03 MED ORDER — POTASSIUM CHLORIDE CRYS ER 20 MEQ PO TBCR
40.0000 meq | EXTENDED_RELEASE_TABLET | Freq: Two times a day (BID) | ORAL | Status: DC
  Administered 2021-11-03 – 2021-11-05 (×4): 40 meq via ORAL
  Filled 2021-11-03 (×5): qty 2

## 2021-11-03 MED ORDER — SODIUM CHLORIDE 0.9 % IV SOLN: 250.0000 mL | INTRAVENOUS | Status: DC | PRN

## 2021-11-03 MED ORDER — SODIUM CHLORIDE 0.9 % IV SOLN: INTRAVENOUS | Status: DC

## 2021-11-03 NOTE — Progress Notes (Signed)
Date 11/03/21 0701 - 11/04/21 0700  Shift 0701-1900 1901-0700 24 Hour Total  INTAKE  I.V.(mL/kg) 154.9(1.9)  154.9(1.9)    Volume (mL) Milrinone  76.6  76.6    Volume (mL) (0.9 %  sodium chloride infusion) 78.3  78.3  Shift Total(mL/kg) 154.9(1.9)  154.9(1.9)  OUTPUT  Urine(mL/kg/hr) 100  100    Output (mL) (Urethral Catheter robin poindexter Double-lumen 12 Fr.) 100  100  Shift Total(mL/kg) 100(1.2)  100(1.2)  NET 54.9  54.9  Weight (kg) 82.6 82.6 82.6   Marked reduction in urine output today.  Chest x-ray personally reviewed, lungs are totally cleared up except for mild basilar atelectasis.  I will discontinue Primacor, we will start her on IV fluids at 75 cc an hour for the next 10 hours, will wait on BMP and BNP.  H&H has remained stable and hemoglobin is slightly improved.  We will continue to follow.   Adrian Prows, MD, [facility] 11/03/2021, 6:28 PM Office: [phone] Fax: [phone] Pager: [phone]

## 2021-11-03 NOTE — Progress Notes (Addendum)
Advanced Heart Failure Rounding Note  PCP-Cardiologist: Dr. Einar Gip    Subjective:    On Milrinone 0.25. No central access   Got 2 doses IV Lasix yesterday w/ only 1.1L in UOP. Wt down 1 lb.  Still SOB. On 4L [place] w/ O2 sats 91%.   SCr 1.64>>1.84>>1.89  K 3.4 Na 129   V-paced, HR 60s.  Blood cx pending   Sitting up in bed, eating breakfast, appetite is good.   RHC 11/15 RA: 21/20, mean 16 mmHg. RV 46/16, EDP 22 mmHg. PA 61/18, mean 35 mmHg.  PA saturation 72%. PW 43/22, mean 23 mmHg.  Giant V waves noted suggestive of severe MR. CO 7.49, CI 4.33 by Fick.  QP/QS 1.00. TPVR 8.08 Wood units.  PVR 2.77 Wood units. SVR 900 dynes.  2D Echo 11/15 EF 60-65% Mild MR. RV ok   Objective:   Weight Range: 82.6 kg Body mass index is 39.41 kg/m.   Vital Signs:   Temp:  [97.7 F (36.5 C)-98.9 F (37.2 C)] 97.7 F (36.5 C) (11/16 0012) Pulse Rate:  [0-170] 80 (11/16 0700) Resp:  [0-54] 21 (11/16 0700) BP: (102-179)/(32-108) 136/89 (11/16 0700) SpO2:  [0 %-100 %] 98 % (11/16 0742) FiO2 (%):  [40 %] 40 % (11/16 0410) Last BM Date: 11/02/21  Weight change: Filed Weights   10/31/21 1423 11/01/21 0332 11/02/21 0513  Weight: 83.9 kg 83.3 kg 82.6 kg    Intake/Output:   Intake/Output Summary (Last 24 hours) at 11/03/2021 0803 Last data filed at 11/03/2021 0732 Gross per 24 hour  Intake 917.82 ml  Output 1050 ml  Net -132.18 ml      Physical Exam    General:  elderly WF. Mild increased WOB  HEENT: Normal Neck: Supple. JVP to jaw , Rt IJ TVPW Carotids 2+ bilat; no bruits. No lymphadenopathy or thyromegaly appreciated. Cor: PMI nondisplaced. Regular rate & rhythm. No rubs, gallops or murmurs. Lungs: decreased BS at the bases bilaterally  Abdomen: Soft, nontender, nondistended. No hepatosplenomegaly. No bruits or masses. Good bowel sounds. Extremities: No cyanosis, clubbing, rash, edema Neuro: Alert & orientedx3, cranial nerves grossly intact. moves all 4 extremities  w/o difficulty. Affect pleasant   Telemetry   V paced 60s   EKG    No new EKG to review   Labs    CBC Recent Labs    10/31/21 0919 11/02/21 0338 11/03/21 0443  WBC 9.7 11.1* 8.3  NEUTROABS 7.6 8.7*  --   HGB 9.8* 9.4* 8.1*  HCT 29.0* 26.9* 23.7*  MCV 86.1 84.1 84.6  PLT 274 270 782   Basic Metabolic Panel Recent Labs    11/01/21 0405 11/02/21 0338 11/03/21 0442  NA 124* 127* 129*  K 4.1 4.6 3.4*  CL 94* 96* 97*  CO2 18* 19* 22  GLUCOSE 108* 82 104*  BUN 18 24* 29*  CREATININE 1.64* 1.84* 1.89*  CALCIUM 8.8* 8.8* 8.5*  MG 1.7 2.1  --    Liver Function Tests Recent Labs    10/31/21 0919  AST 20  ALT 13  ALKPHOS 67  BILITOT 0.7  PROT 6.5  ALBUMIN 3.7   No results for input(s): LIPASE, AMYLASE in the last 72 hours. Cardiac Enzymes No results for input(s): CKTOTAL, CKMB, CKMBINDEX, TROPONINI in the last 72 hours.  BNP: BNP (last 3 results) Recent Labs    06/19/21 0319 10/31/21 0919  BNP 163.9* 525.0*    ProBNP (last 3 results) No results for input(s): PROBNP in the last  8760 hours.   D-Dimer No results for input(s): DDIMER in the last 72 hours. Hemoglobin A1C No results for input(s): HGBA1C in the last 72 hours. Fasting Lipid Panel No results for input(s): CHOL, HDL, LDLCALC, TRIG, CHOLHDL, LDLDIRECT in the last 72 hours. Thyroid Function Tests Recent Labs    10/31/21 0919  TSH 1.355    Other results:   Imaging    CARDIAC CATHETERIZATION  Result Date: 11/02/2021 Right heart catheterization and temporary transvenous pacemaker implantation via right IJ 11/02/2021: RA: 21/20, mean 16 mmHg. RV 46/16, EDP 22 mmHg. PA 61/18, mean 35 mmHg.  PA saturation 72%. PW 43/22, mean 23 mmHg.  Giant V waves noted suggestive of severe MR. CO 7.49, CI 4.33 by Fick.  QP/QS 1.00. TPVR 8.08 Wood units.  PVR 2.77 Wood units. SVR 900 dynes. Temporary transvenous pacemaker placed into the right ventricular apex, capture obtained at 1 mV.  30 cm from IJ,  settings at 80 bpm asynchronous at 5 mA. Impression: Findings are consistent with acute decompensated heart failure with pulmonary edema secondary to severe mitral regurgitation.  We will transfer to the unit and try to diurese her and use vasodilator therapy.  If she does not respond, we may have to consider mechanical support.   ECHOCARDIOGRAM COMPLETE  Result Date: 11/02/2021    ECHOCARDIOGRAM REPORT   Patient Name:   Deborah Jordan [facility] Date of Exam: 11/02/2021 Medical Rec #:  [id number]     Height:       57.0 in Accession #:    [id number]    Weight:       182.1 lb Date of Birth:  July 27, 1933     BSA:          1.728 m Patient Age:    87 years      BP:           175/81 mmHg Patient Gender: F             HR:           81 bpm. Exam Location:  Inpatient Procedure: 2D Echo Indications:     dyspnea. respiratory distress  History:         Patient has prior history of Echocardiogram examinations, most                  recent 09/24/2021. CHF, Arrythmias:external pacer; Risk                  Factors:Hypertension.  Sonographer:     Johny Chess RDCS Referring Phys:  [id number] Adrian Prows Diagnosing Phys: Adrian Prows MD  Sonographer Comments: Image acquisition challenging due to respiratory motion. IMPRESSIONS  1. Left ventricular ejection fraction, by estimation, is 65 to 70%. The left ventricle has normal function. Left ventricular endocardial border not optimally defined to evaluate regional wall motion. There is mild concentric left ventricular hypertrophy. Left ventricular diastolic parameters are indeterminate.  2. Right ventricular systolic function is normal. The right ventricular size is normal.  3. Left atrial size was severely dilated.  4. The mitral valve is myxomatous. Mild mitral valve regurgitation. There is moderate late systolic prolapse of both leaflets of the mitral valve.  5. Elevated velocity due to hyperdynamic LVEF. The aortic valve is normal in structure. Aortic valve regurgitation is not visualized. No  aortic stenosis is present.  6. The inferior vena cava is dilated in size with <50% respiratory variability, suggesting right atrial pressure of 15 mmHg. Comparison(s): No significant change from prior study.  09/24/2021. FINDINGS  Left Ventricle: Diastolic function not assessed due to paced rhythm. Left ventricular ejection fraction, by estimation, is 65 to 70%. The left ventricle has normal function. Left ventricular endocardial border not optimally defined to evaluate regional wall motion. The left ventricular internal cavity size was normal in size. There is mild concentric left ventricular hypertrophy. Left ventricular diastolic parameters are indeterminate. Right Ventricle: The right ventricular size is normal. No increase in right ventricular wall thickness. Right ventricular systolic function is normal. Left Atrium: Left atrial size was severely dilated. Right Atrium: Right atrial size was normal in size. Pericardium: There is no evidence of pericardial effusion. Mitral Valve: The mitral valve is myxomatous. There is moderate late systolic prolapse of both leaflets of the mitral valve. There is mild thickening of the mitral valve leaflet(s). Mild mitral valve regurgitation. Tricuspid Valve: The tricuspid valve is normal in structure. Tricuspid valve regurgitation is not demonstrated. No evidence of tricuspid stenosis. Aortic Valve: Elevated velocity due to hyperdynamic LVEF. The aortic valve is normal in structure. Aortic valve regurgitation is not visualized. No aortic stenosis is present. Aortic valve mean gradient measures 14.0 mmHg. Aortic valve peak gradient measures 25.4 mmHg. Pulmonic Valve: The pulmonic valve was normal in structure. Pulmonic valve regurgitation is not visualized. No evidence of pulmonic stenosis. Aorta: The aortic root is normal in size and structure. Venous: The inferior vena cava is dilated in size with less than 50% respiratory variability, suggesting right atrial pressure of 15  mmHg. IAS/Shunts: No atrial level shunt detected by color flow Doppler. Additional Comments: A device lead is visualized.  LEFT VENTRICLE PLAX 2D LVIDd:         4.80 cm LVIDs:         2.60 cm LV PW:         1.20 cm LV IVS:        1.20 cm  RIGHT VENTRICLE TAPSE (M-mode): 2.2 cm LEFT ATRIUM             Index        RIGHT ATRIUM           Index LA diam:        4.60 cm 2.66 cm/m   RA Area:     13.40 cm LA Vol (A2C):   50.1 ml 28.99 ml/m  RA Volume:   29.25 ml  16.92 ml/m LA Vol (A4C):   70.1 ml 40.56 ml/m LA Biplane Vol: 59.1 ml 34.19 ml/m  AORTIC VALVE AV Vmax:      252.00 cm/s AV Vmean:     171.000 cm/s AV VTI:       0.424 m AV Peak Grad: 25.4 mmHg AV Mean Grad: 14.0 mmHg  AORTA Ao Asc diam: 3.30 cm Adrian Prows MD Electronically signed by Adrian Prows MD Signature Date/Time: 11/02/2021/10:03:52 PM    Final      Medications:     Scheduled Medications:  aspirin EC  81 mg Oral Daily   Chlorhexidine Gluconate Cloth  6 each Topical Daily   DULoxetine  20 mg Oral Daily   enoxaparin (LOVENOX) injection  30 mg Subcutaneous Q24H   furosemide  80 mg Intravenous [id number]   gabapentin  100 mg Oral QHS   magnesium oxide  400 mg Oral BID   pantoprazole  40 mg Oral Daily   sodium chloride flush  3 mL Intravenous Q12H   sodium chloride flush  3 mL Intravenous Q12H   sodium chloride flush  3 mL Intravenous Q12H  sodium chloride  1 g Oral BID WC   tamsulosin  0.4 mg Oral QPC supper   trimethoprim  100 mg Oral Daily    Infusions:  sodium chloride     sodium chloride     sodium chloride     sodium chloride     milrinone 0.25 mcg/kg/min (11/03/21 0732)    PRN Medications: sodium chloride, sodium chloride, sodium chloride, acetaminophen, albuterol, LORazepam, melatonin, ondansetron (ZOFRAN) IV, sodium chloride flush, sodium chloride flush, sodium chloride flush  Assessment/Plan   Mitral Regurgitation  - TTE  done 10/22 for bacteremia showed moderate holosystolic prolapse of the middle scallop of  the posterior leaflet of the mitral valve w/ mild regurgitation. Did not get TEE - Now w/ admitted w/ a/c CHF/ pulmonary edema w/ RHC suggestive of severe MR (large V waves). However MR appears only mild on repeat TTE - ? If sequela from recent bacteremia. Infective endocarditis was never excluded. Needs definitive TEE to better define the valve - now off abx. Repeat blood cultures pending  - cannot exclude ischemic MR. Conduction dz/bradycardia, know CAD and PAD. No LHC yet w/ AKI  - milrinone for afterload reduction  - If TEE confirms severe MR, will need to insure that bacteremia has cleared prior to consideration for potential MitraClip    2. Acute on Chronic Diastolic Heart Failure - Echo 04/2017 EF 55-60%, G1DD, No MR - Echo 10/22 EF 55-60% - RHC  mPCWP 23, RA 16. Normal CO  - Diureses sluggish. IV Lasix increased to TID today - Consider metolazone  - Milrinone for afterload reduction given severe MR - off ? blocker w/ bradycardia    3. AKI on IIIa CKD  - Baseline SCr 0.6 - 1.66 on admit, 1.84>>1.89  - suspect cardiorenal from MR  - monitor w/ diuresis    4. CHB - now off ? blocker - supported w/ TVPW  - EP has seen, will need negative Blood cx prior to possible PPM implant    5. Hypervolemic Hyponatremia - Na 127>129  - diuresis and fluid restrict     Length of Stay: 3  Brittainy Simmons, PA-C  11/03/2021, 8:03 AM  Advanced Heart Failure Team Pager [phone] (M-F; 7a - 5p)  Please contact [facility] for night-coverage after hours (5p -7a ) and weekends on [email or web address]  Agree with above.   Respiratory status improved today. Off bipap. Weight down 1 pound with IV diuresis. Sats remain marginal   Remains on milrinone.   Remains in CHB with TVP.   General:  Elderly woman sitting up in bed.  HEENT: normal Neck: supple. JVP 7-8  RIJ TVP Carotids 2+ bilat; no bruits. No lymphadenopathy or thryomegaly appreciated. Cor: PMI nondisplaced. Regular rate & rhythm. I do  not hear MR Lungs: clear Abdomen: obese soft, nontender, nondistended. No hepatosplenomegaly. No bruits or masses. Good bowel sounds. Extremities: no cyanosis, clubbing, rash, edema Neuro: alert & orientedx3, cranial nerves grossly intact. moves all 4 extremities w/o difficulty. Affect pleasant  Remains tenuous. RHC suggested severe MR but TTE unimpressive. Needs TEE to get closer look at MV to sort out (and look for vegetation).   Respiratory status improved but still tenuous. May need intubation for TEE. Will d/w Dr. Einar Gip and team. Need to clarify goals of care with patient and family. EP on board - appreciate their input.   CRITICAL CARE Performed by: Glori Bickers  Total critical care time: 35 minutes  Critical care time was exclusive of separately billable procedures and  treating other patients.  Critical care was necessary to treat or prevent imminent or life-threatening deterioration.  Critical care was time spent personally by me (independent of midlevel providers or residents) on the following activities: development of treatment plan with patient and/or surrogate as well as nursing, discussions with consultants, evaluation of patient's response to treatment, examination of patient, obtaining history from patient or surrogate, ordering and performing treatments and interventions, ordering and review of laboratory studies, ordering and review of radiographic studies, pulse oximetry and re-evaluation of patient's condition.  Glori Bickers, MD  11:31 AM

## 2021-11-03 NOTE — Plan of Care (Signed)
  Problem: Education: Goal: Knowledge of General Education information will improve Description: Including pain rating scale, medication(s)/side effects and non-pharmacologic comfort measures Outcome: Not Progressing   Problem: Health Behavior/Discharge Planning: Goal: Ability to manage health-related needs will improve Outcome: Not Progressing   Problem: Activity: Goal: Risk for activity intolerance will decrease Outcome: Not Progressing   Problem: Coping: Goal: Level of anxiety will decrease Outcome: Not Progressing

## 2021-11-03 NOTE — Progress Notes (Signed)
Electrophysiology Rounding Note  Patient Name: Deborah Jordan Date of Encounter: 11/03/2021  Primary Cardiologist: Dr. Einar Gip Electrophysiologist: New to Dr. Quentin Ore   Subjective  NAEO. Remains on BiPAP. TTE yesterday did not visualize significant MR. RHC concerning for significant MR.  TEE Planned.  Inpatient Medications    Scheduled Meds:  aspirin EC  81 mg Oral Daily   Chlorhexidine Gluconate Cloth  6 each Topical Daily   DULoxetine  20 mg Oral Daily   enoxaparin (LOVENOX) injection  30 mg Subcutaneous Q24H   furosemide  80 mg Intravenous [id number]   gabapentin  100 mg Oral QHS   magnesium oxide  400 mg Oral BID   pantoprazole  40 mg Oral Daily   sodium chloride flush  3 mL Intravenous Q12H   sodium chloride flush  3 mL Intravenous Q12H   sodium chloride flush  3 mL Intravenous Q12H   sodium chloride  1 g Oral BID WC   tamsulosin  0.4 mg Oral QPC supper   trimethoprim  100 mg Oral Daily   Continuous Infusions:  sodium chloride     sodium chloride     sodium chloride     sodium chloride     milrinone 0.25 mcg/kg/min (11/03/21 0400)   PRN Meds: sodium chloride, sodium chloride, sodium chloride, acetaminophen, albuterol, LORazepam, melatonin, ondansetron (ZOFRAN) IV, sodium chloride flush, sodium chloride flush, sodium chloride flush   Vital Signs    Vitals:   11/03/21 0200 11/03/21 0300 11/03/21 0400 11/03/21 0410  BP: (!) 114/32 (!) 109/32  (!) 146/39  Pulse: (!) 59 (!) 59  (!) 56  Resp: (!) 21 18  (!) 24  Temp:      TempSrc:      SpO2: 97% 96% 95% 92%  Weight:      Height:        Intake/Output Summary (Last 24 hours) at 11/03/2021 0708 Last data filed at 11/03/2021 0400 Gross per 24 hour  Intake 897.08 ml  Output 750 ml  Net 147.08 ml    Filed Weights   10/31/21 1423 11/01/21 0332 11/02/21 0513  Weight: 83.9 kg 83.3 kg 82.6 kg    Physical Exam    GEN- The patient is acutely ill appearing with SOB.  Head- normocephalic, atraumatic Eyes-   Sclera clear, conjunctiva pink Ears- hearing intact Oropharynx- clear Neck- supple Lungs- Diminished with rales, increased work of breathing on BiPAP Heart-  Slow and irregular  GI- soft, NT, ND, + BS Extremities- no clubbing or cyanosis.  Skin- no rash or lesion Psych- flat affect  Labs    CBC Recent Labs    10/31/21 0919 11/02/21 0338 11/03/21 0443  WBC 9.7 11.1* 8.3  NEUTROABS 7.6 8.7*  --   HGB 9.8* 9.4* 8.1*  HCT 29.0* 26.9* 23.7*  MCV 86.1 84.1 84.6  PLT 274 270 956    Basic Metabolic Panel Recent Labs    11/01/21 0405 11/02/21 0338 11/03/21 0442  NA 124* 127* 129*  K 4.1 4.6 3.4*  CL 94* 96* 97*  CO2 18* 19* 22  GLUCOSE 108* 82 104*  BUN 18 24* 29*  CREATININE 1.64* 1.84* 1.89*  CALCIUM 8.8* 8.8* 8.5*  MG 1.7 2.1  --     Liver Function Tests Recent Labs    10/31/21 0919  AST 20  ALT 13  ALKPHOS 67  BILITOT 0.7  PROT 6.5  ALBUMIN 3.7    No results for input(s): LIPASE, AMYLASE in the last 72 hours. Cardiac Enzymes  No results for input(s): CKTOTAL, CKMB, CKMBINDEX, TROPONINI in the last 72 hours.   Telemetry    2:1 AV block in 40-50s, this am is in CHB with escape in 40-50s (personally reviewed)  Radiology    CARDIAC CATHETERIZATION  Result Date: 11/02/2021 Right heart catheterization and temporary transvenous pacemaker implantation via right IJ 11/02/2021: RA: 21/20, mean 16 mmHg. RV 46/16, EDP 22 mmHg. PA 61/18, mean 35 mmHg.  PA saturation 72%. PW 43/22, mean 23 mmHg.  Giant V waves noted suggestive of severe MR. CO 7.49, CI 4.33 by Fick.  QP/QS 1.00. TPVR 8.08 Wood units.  PVR 2.77 Wood units. SVR 900 dynes. Temporary transvenous pacemaker placed into the right ventricular apex, capture obtained at 1 mV.  30 cm from IJ, settings at 80 bpm asynchronous at 5 mA. Impression: Findings are consistent with acute decompensated heart failure with pulmonary edema secondary to severe mitral regurgitation.  We will transfer to the unit and try to  diurese her and use vasodilator therapy.  If she does not respond, we may have to consider mechanical support.   ECHOCARDIOGRAM COMPLETE  Result Date: 11/02/2021    ECHOCARDIOGRAM REPORT   Patient Name:   Deborah Jordan [facility] Date of Exam: 11/02/2021 Medical Rec #:  [id number]     Height:       57.0 in Accession #:    [id number]    Weight:       182.1 lb Date of Birth:  1933/12/07     BSA:          1.728 m Patient Age:    87 years      BP:           175/81 mmHg Patient Gender: F             HR:           81 bpm. Exam Location:  Inpatient Procedure: 2D Echo Indications:     dyspnea. respiratory distress  History:         Patient has prior history of Echocardiogram examinations, most                  recent 09/24/2021. CHF, Arrythmias:external pacer; Risk                  Factors:Hypertension.  Sonographer:     Johny Chess RDCS Referring Phys:  [id number] Adrian Prows Diagnosing Phys: Adrian Prows MD  Sonographer Comments: Image acquisition challenging due to respiratory motion. IMPRESSIONS  1. Left ventricular ejection fraction, by estimation, is 65 to 70%. The left ventricle has normal function. Left ventricular endocardial border not optimally defined to evaluate regional wall motion. There is mild concentric left ventricular hypertrophy. Left ventricular diastolic parameters are indeterminate.  2. Right ventricular systolic function is normal. The right ventricular size is normal.  3. Left atrial size was severely dilated.  4. The mitral valve is myxomatous. Mild mitral valve regurgitation. There is moderate late systolic prolapse of both leaflets of the mitral valve.  5. Elevated velocity due to hyperdynamic LVEF. The aortic valve is normal in structure. Aortic valve regurgitation is not visualized. No aortic stenosis is present.  6. The inferior vena cava is dilated in size with <50% respiratory variability, suggesting right atrial pressure of 15 mmHg. Comparison(s): No significant change from prior study. 09/24/2021.  FINDINGS  Left Ventricle: Diastolic function not assessed due to paced rhythm. Left ventricular ejection fraction, by estimation, is 65 to 70%. The left ventricle has normal function.  Left ventricular endocardial border not optimally defined to evaluate regional wall motion. The left ventricular internal cavity size was normal in size. There is mild concentric left ventricular hypertrophy. Left ventricular diastolic parameters are indeterminate. Right Ventricle: The right ventricular size is normal. No increase in right ventricular wall thickness. Right ventricular systolic function is normal. Left Atrium: Left atrial size was severely dilated. Right Atrium: Right atrial size was normal in size. Pericardium: There is no evidence of pericardial effusion. Mitral Valve: The mitral valve is myxomatous. There is moderate late systolic prolapse of both leaflets of the mitral valve. There is mild thickening of the mitral valve leaflet(s). Mild mitral valve regurgitation. Tricuspid Valve: The tricuspid valve is normal in structure. Tricuspid valve regurgitation is not demonstrated. No evidence of tricuspid stenosis. Aortic Valve: Elevated velocity due to hyperdynamic LVEF. The aortic valve is normal in structure. Aortic valve regurgitation is not visualized. No aortic stenosis is present. Aortic valve mean gradient measures 14.0 mmHg. Aortic valve peak gradient measures 25.4 mmHg. Pulmonic Valve: The pulmonic valve was normal in structure. Pulmonic valve regurgitation is not visualized. No evidence of pulmonic stenosis. Aorta: The aortic root is normal in size and structure. Venous: The inferior vena cava is dilated in size with less than 50% respiratory variability, suggesting right atrial pressure of 15 mmHg. IAS/Shunts: No atrial level shunt detected by color flow Doppler. Additional Comments: A device lead is visualized.  LEFT VENTRICLE PLAX 2D LVIDd:         4.80 cm LVIDs:         2.60 cm LV PW:         1.20 cm LV IVS:         1.20 cm  RIGHT VENTRICLE TAPSE (M-mode): 2.2 cm LEFT ATRIUM             Index        RIGHT ATRIUM           Index LA diam:        4.60 cm 2.66 cm/m   RA Area:     13.40 cm LA Vol (A2C):   50.1 ml 28.99 ml/m  RA Volume:   29.25 ml  16.92 ml/m LA Vol (A4C):   70.1 ml 40.56 ml/m LA Biplane Vol: 59.1 ml 34.19 ml/m  AORTIC VALVE AV Vmax:      252.00 cm/s AV Vmean:     171.000 cm/s AV VTI:       0.424 m AV Peak Grad: 25.4 mmHg AV Mean Grad: 14.0 mmHg  AORTA Ao Asc diam: 3.30 cm Adrian Prows MD Electronically signed by Adrian Prows MD Signature Date/Time: 11/02/2021/10:03:52 PM    Final     Patient Profile     Deborah Jordan is a 87 y.o. female with a history of HTN, CKD, HLD, bifascicular block, and hyponatremia who is being seen today for the evaluation of bradycardia and heart failure at the request of Dr. Telford Nab.  Assessment & Plan    #Complete heart block, advanced AV block I do not think her conduction disease is causing her respiratory distress.  RHC and TTE showed discordant MR. TEE planned to further characterize.  Remains in CHB underneath V pacing. Capture threshold 0.41mV today on TVP.   #Acute respiratory failure Continues to require BiPAP. Right heart cath pending.  EP following.  For questions or updates, please contact [place] Please consult [email or web address] for contact info under Cardiology/STEMI.  Signed, Vickie Epley, MD  11/03/2021, 7:08 AM

## 2021-11-03 NOTE — Progress Notes (Signed)
Subjective:  Much more alert, not in acute distress, patient was not on BiPAP when I saw her this morning and saturating around 95% on nasal cannula.  States that she is marginally better.  No chest pain.  Intake/Output from previous day:  I/O last 3 completed shifts: In: 897.1 [P.O.:800; I.V.:97.1] Out: 1500 [Urine:1500] Total I/O In: 20.7 [I.V.:20.7] Out: -   Blood pressure (!) 135/39, pulse (!) 31, temperature 98.6 F (37 C), resp. rate (!) 23, height _0  (1.448 m), weight 82.6 kg, SpO2 92 %.  Filed Weights   10/31/21 1423 11/01/21 0332 11/02/21 0513  Weight: 83.9 kg 83.3 kg 82.6 kg     Vitals with BMI 11/03/2021 11/03/2021 11/03/2021  Height - - -  Weight - - -  BMI - - -  Systolic 026 378 588  Diastolic 39 41 43  Pulse 31 58 59    Physical Exam Constitutional:      General: She is not in acute distress.    Appearance: She is obese.     Interventions: Face mask in place.  Eyes:     Extraocular Movements: Extraocular movements intact.  Neck:     Vascular: No carotid bruit or JVD.  Cardiovascular:     Heart sounds: Murmur heard.  Crescendo-decrescendo mid to late systolic murmur is present with a grade of 3/6 at the apex.  Pulmonary:     Effort: No accessory muscle usage or respiratory distress.     Breath sounds: Decreased air movement present. Rales (diffuse bilateral scattered improved from before) present.  Abdominal:     General: Abdomen is flat. Bowel sounds are normal.     Palpations: Abdomen is soft.  Musculoskeletal:     Right lower leg: No edema.     Left lower leg: No edema.  Skin:    General: Skin is warm.     Capillary Refill: Capillary refill takes less than 2 seconds.  Neurological:     General: No focal deficit present.     Mental Status: She is alert and oriented to person, place, and time.  Psychiatric:        Behavior: Behavior is cooperative.    Lab Results: ProBNP (last 3 results) No results for input(s): PROBNP in the last 8760  hours. BMP Latest Ref Rng & Units 11/03/2021 11/02/2021 11/01/2021  Glucose 70 - 99 mg/dL 104(H) 82 108(H)  BUN 8 - 23 mg/dL 29(H) 24(H) 18  Creatinine 0.44 - 1.00 mg/dL 1.89(H) 1.84(H) 1.64(H)  Sodium 135 - 145 mmol/L 129(L) 127(L) 124(L)  Potassium 3.5 - 5.1 mmol/L 3.4(L) 4.6 4.1  Chloride 98 - 111 mmol/L 97(L) 96(L) 94(L)  CO2 22 - 32 mmol/L 22 19(L) 18(L)  Calcium 8.9 - 10.3 mg/dL 8.5(L) 8.8(L) 8.8(L)   Hepatic Function Latest Ref Rng & Units 10/31/2021 09/30/2021 09/26/2021  Total Protein 6.5 - 8.1 g/dL 6.5 5.6(L) 5.1(L)  Albumin 3.5 - 5.0 g/dL 3.7 2.4(L) 2.2(L)  AST 15 - 41 U/L 20 21 33  ALT 0 - 44 U/L 13 30 114(H)  Alk Phosphatase 38 - 126 U/L 67 114 191(H)  Total Bilirubin 0.3 - 1.2 mg/dL 0.7 0.8 0.8  Bilirubin, Direct 0.0 - 0.2 mg/dL - - -   CBC Latest Ref Rng & Units 11/03/2021 11/02/2021 10/31/2021  WBC 4.0 - 10.5 K/uL 8.3 11.1(H) 9.7  Hemoglobin 12.0 - 15.0 g/dL 8.1(L) 9.4(L) 9.8(L)  Hematocrit 36.0 - 46.0 % 23.7(L) 26.9(L) 29.0(L)  Platelets 150 - 400 K/uL 279 270 274  BNP (last 3 results) Recent Labs    06/19/21 0319 10/31/21 0919  BNP 163.9* 525.0*    ProBNP (last 3 results) No results for input(s): PROBNP in the last 8760 hours.  Lipid Panel  No results found for: CHOL, TRIG, HDL, CHOLHDL, VLDL, LDLCALC, LDLDIRECT Cardiac Panel (last 3 results) No results for input(s): CKTOTAL, CKMB, TROPONINI, RELINDX in the last 72 hours.  HEMOGLOBIN A1C No results found for: HGBA1C, MPG TSH Recent Labs    06/19/21 0319 10/31/21 0919  TSH 1.872 1.355   Imaging: CARDIAC CATHETERIZATION  Result Date: 11/02/2021 Right heart catheterization and temporary transvenous pacemaker implantation via right IJ 11/02/2021: RA: 21/20, mean 16 mmHg. RV 46/16, EDP 22 mmHg. PA 61/18, mean 35 mmHg.  PA saturation 72%. PW 43/22, mean 23 mmHg.  Giant V waves noted suggestive of severe MR. CO 7.49, CI 4.33 by Fick.  QP/QS 1.00. TPVR 8.08 Wood units.  PVR 2.77 Wood units. SVR 900  dynes. Temporary transvenous pacemaker placed into the right ventricular apex, capture obtained at 1 mV.  30 cm from IJ, settings at 80 bpm asynchronous at 5 mA. Impression: Findings are consistent with acute decompensated heart failure with pulmonary edema secondary to severe mitral regurgitation.  We will transfer to the unit and try to diurese her and use vasodilator therapy.  If she does not respond, we may have to consider mechanical support.   ECHOCARDIOGRAM COMPLETE  Result Date: 11/02/2021    ECHOCARDIOGRAM REPORT   Patient Name:   Deborah Jordan [facility] Date of Exam: 11/02/2021 Medical Rec #:  [id number]     Height:       57.0 in Accession #:    [id number]    Weight:       182.1 lb Date of Birth:  Dec 29, 1932     BSA:          1.728 m Patient Age:    87 years      BP:           175/81 mmHg Patient Gender: F             HR:           81 bpm. Exam Location:  Inpatient Procedure: 2D Echo Indications:     dyspnea. respiratory distress  History:         Patient has prior history of Echocardiogram examinations, most                  recent 09/24/2021. CHF, Arrythmias:external pacer; Risk                  Factors:Hypertension.  Sonographer:     Johny Chess RDCS Referring Phys:  [id number] Adrian Prows Diagnosing Phys: Adrian Prows MD  Sonographer Comments: Image acquisition challenging due to respiratory motion. IMPRESSIONS  1. Left ventricular ejection fraction, by estimation, is 65 to 70%. The left ventricle has normal function. Left ventricular endocardial border not optimally defined to evaluate regional wall motion. There is mild concentric left ventricular hypertrophy. Left ventricular diastolic parameters are indeterminate.  2. Right ventricular systolic function is normal. The right ventricular size is normal.  3. Left atrial size was severely dilated.  4. The mitral valve is myxomatous. Mild mitral valve regurgitation. There is moderate late systolic prolapse of both leaflets of the mitral valve.  5. Elevated velocity  due to hyperdynamic LVEF. The aortic valve is normal in structure. Aortic valve regurgitation is not visualized. No aortic stenosis is present.  6. The inferior  vena cava is dilated in size with <50% respiratory variability, suggesting right atrial pressure of 15 mmHg. Comparison(s): No significant change from prior study. 09/24/2021. FINDINGS  Left Ventricle: Diastolic function not assessed due to paced rhythm. Left ventricular ejection fraction, by estimation, is 65 to 70%. The left ventricle has normal function. Left ventricular endocardial border not optimally defined to evaluate regional wall motion. The left ventricular internal cavity size was normal in size. There is mild concentric left ventricular hypertrophy. Left ventricular diastolic parameters are indeterminate. Right Ventricle: The right ventricular size is normal. No increase in right ventricular wall thickness. Right ventricular systolic function is normal. Left Atrium: Left atrial size was severely dilated. Right Atrium: Right atrial size was normal in size. Pericardium: There is no evidence of pericardial effusion. Mitral Valve: The mitral valve is myxomatous. There is moderate late systolic prolapse of both leaflets of the mitral valve. There is mild thickening of the mitral valve leaflet(s). Mild mitral valve regurgitation. Tricuspid Valve: The tricuspid valve is normal in structure. Tricuspid valve regurgitation is not demonstrated. No evidence of tricuspid stenosis. Aortic Valve: Elevated velocity due to hyperdynamic LVEF. The aortic valve is normal in structure. Aortic valve regurgitation is not visualized. No aortic stenosis is present. Aortic valve mean gradient measures 14.0 mmHg. Aortic valve peak gradient measures 25.4 mmHg. Pulmonic Valve: The pulmonic valve was normal in structure. Pulmonic valve regurgitation is not visualized. No evidence of pulmonic stenosis. Aorta: The aortic root is normal in size and structure. Venous: The  inferior vena cava is dilated in size with less than 50% respiratory variability, suggesting right atrial pressure of 15 mmHg. IAS/Shunts: No atrial level shunt detected by color flow Doppler. Additional Comments: A device lead is visualized.  LEFT VENTRICLE PLAX 2D LVIDd:         4.80 cm LVIDs:         2.60 cm LV PW:         1.20 cm LV IVS:        1.20 cm  RIGHT VENTRICLE TAPSE (M-mode): 2.2 cm LEFT ATRIUM             Index        RIGHT ATRIUM           Index LA diam:        4.60 cm 2.66 cm/m   RA Area:     13.40 cm LA Vol (A2C):   50.1 ml 28.99 ml/m  RA Volume:   29.25 ml  16.92 ml/m LA Vol (A4C):   70.1 ml 40.56 ml/m LA Biplane Vol: 59.1 ml 34.19 ml/m  AORTIC VALVE AV Vmax:      252.00 cm/s AV Vmean:     171.000 cm/s AV VTI:       0.424 m AV Peak Grad: 25.4 mmHg AV Mean Grad: 14.0 mmHg  AORTA Ao Asc diam: 3.30 cm Adrian Prows MD Electronically signed by Adrian Prows MD Signature Date/Time: 11/02/2021/10:03:52 PM    Final     Cardiac Studies:  EKG:   Echocardiogram 09/24/2021:    1. Left ventricular ejection fraction, by estimation, is 55 to 60%. The left ventricle has normal function. The left ventricle has no regional wall motion abnormalities.  2. Right ventricular systolic function is normal. The right ventricular size is normal.  3. Left atrial size was mildly dilated.  4. Mild mitral valve regurgitation. There is moderate holosystolic prolapse of the middle scallop of the posterior leaflet of the mitral valve.  5. The  aortic valve is calcified. Aortic valve regurgitation is not visualized.   Conclusion(s)/Recommendation(s): No evidence of valvular vegetations on this transthoracic echocardiogram. Would recommend a transesophageal echocardiogram to exclude infective endocarditis if clinically indicated.   Carotid artery duplex 07/06/2021: Duplex suggests stenosis in the right internal carotid artery (1-15%). Duplex suggests stenosis in the left internal carotid artery (16-49%). Duplex  suggests stenosis in the left external carotid artery (<50%). Antegrade right vertebral artery flow. Antegrade left vertebral artery flow. Follow up in one year is appropriate if clinically indicated.  Right heart catheterization and temporary transvenous pacemaker implantation via right IJ 11/02/2021: RA: 21/20, mean 16 mmHg. RV 46/16, EDP 22 mmHg. PA 61/18, mean 35 mmHg.  PA saturation 72%. PW 43/22, mean 23 mmHg.  Giant V waves noted suggestive of severe MR. CO 7.49, CI 4.33 by Fick.  QP/QS 1.00. TPVR 8.08 Wood units.  PVR 2.77 Wood units. SVR 900 dynes.  Temporary transvenous pacemaker placed into the right ventricular apex, capture obtained at 1 mV.  30 cm from IJ, settings at 80 bpm asynchronous at 5 mA.  Impression: Findings are consistent with acute decompensated heart failure with pulmonary edema secondary to severe mitral regurgitation.  We will transfer to the unit and try to diurese her and use vasodilator therapy.  If she does not respond, we may have to consider mechanical support.  Echocardiogram 11/02/2021:    1. Left ventricular ejection fraction, by estimation, is 65 to 70%. The left ventricle has normal function. Left ventricular endocardial border not optimally defined to evaluate regional wall motion. There is mild concentric left ventricular  hypertrophy. Left ventricular diastolic parameters are indeterminate.  2. Right ventricular systolic function is normal. The right ventricular size is normal.  3. Left atrial size was severely dilated.  4. The mitral valve is myxomatous. Mild mitral valve regurgitation. There is moderate late systolic prolapse of both leaflets of the mitral valve.  5. Elevated velocity due to hyperdynamic LVEF. The aortic valve is normal in structure. Aortic valve regurgitation is not visualized. No aortic stenosis is present.  6. The inferior vena cava is dilated in size with <50% respiratory variability, suggesting right atrial pressure of 15  mmHg.   Scheduled Meds:  aspirin EC  81 mg Oral Daily   Chlorhexidine Gluconate Cloth  6 each Topical Daily   DULoxetine  20 mg Oral Daily   enoxaparin (LOVENOX) injection  30 mg Subcutaneous Q24H   furosemide  80 mg Intravenous [id number]   gabapentin  100 mg Oral QHS   magnesium oxide  400 mg Oral BID   pantoprazole  40 mg Oral Daily   sodium chloride flush  3 mL Intravenous Q12H   sodium chloride flush  3 mL Intravenous Q12H   sodium chloride flush  3 mL Intravenous Q12H   sodium chloride  1 g Oral BID WC   tamsulosin  0.4 mg Oral QPC supper   trimethoprim  100 mg Oral Daily   Continuous Infusions:  sodium chloride     sodium chloride     sodium chloride 10 mL/hr at 11/03/21 0909   sodium chloride     milrinone 0.25 mcg/kg/min (11/03/21 0732)   PRN Meds:.sodium chloride, sodium chloride, sodium chloride, acetaminophen, albuterol, LORazepam, melatonin, ondansetron (ZOFRAN) IV, sodium chloride flush, sodium chloride flush, sodium chloride flush  Assessment/Plan:   TRACIE DORE  is a 87 y.o. with hypertension, hyperlipidemia, history of chronic kidney disease, hyperglycemia, bifascicular block on EKG, chronic palpitations which are improved with beta-blocker  therapy.  She was last admitted in July 2022 with hyponatremia due to excessive fluid intake as she was having dysphagia for solid foods.  She has recuperated well from this, again admitted in October 2022 a month ago for urosepsis.  Due to marked asymptomatic sinus bradycardia, on 10/28/2021, carvedilol discontinued and I reduced the dose of amlodipine from 10 mg to 5 mg daily.  Patient now admitted with acute respiratory distress and Mobitz 2 AV block.  1.  Acute pulm edema probably secondary to diastolic dysfunction and also complete heart block could be contributing to worsening diastolic heart failure. 2. Severe mitral regurgitation by right heart catheterization and moderate pulmonary hypertension, elevated wedge  pressure.  However transthoracic echocardiogram does not suggest significant MR.  Ischemic etiology with papillary muscle dysfunction and MR could also be an etiology, will avoid left heart catheterization for now in view of acute renal failure. 3.  Complete heart block.  Still in complete heart block this morning, had stopped capturing and had intermittent capture, thresholds checked, increased output and may to 6 and increase the heart rate to 70 bpm. 4.  Acute renal failure with acute tubular necrosis secondary to acute circulatory failure, stage IV.  Recommendations: Continue vasodilator therapy with milrinone for now, will probably turn it off this evening.  If her symptoms and presentation improved significantly with continuous pacing, heart block with worsening diastolic function could be the etiology.  Hence we will continue to monitor her hemodynamics and also labs closely.  If this is the case, she will benefit certainly from permanent pacemaker implantation.  Mitral regurgitation may have been caused by AV dyssynchrony or ischemia.  Echocardiogram clearly does not suggest even moderate mitral regurgitation.  In view of her tenuous respiratory status, would probably hold off on TEE for, could consider this when stable or even consider left heart catheterization once stable.  With regard to renal failure, will avoid ACE inhibitor's and ARB, continue to trend her serum creatinine.  Critical care time 35 minutes including management of fluid status, vasodilator therapy, adjustment and temporary pacemaker.  I have discussed with the patient's daughter Meredith Mody, regarding   Adrian Prows, MD, [facility] 11/03/2021, 12:33 PM Office: [phone] Fax: [phone] Pager: [phone]

## 2021-11-03 NOTE — Progress Notes (Signed)
I discussed with patient's daughter.  I would like to proceed with TEE unless she is clinically unstable tomorrow.  Risks and benefits of TEE discussed with the patient's daughter.  Patient's daughter, Judieth Keens understands the gravity of the situation, in case of a cardiac arrest she does not want any heroic measures to be performed.  She does not want to discuss CODE STATUS with the patient's mother as she is sick and ill right now.  We will continue with aggressive measures for now, if her condition deteriorates will change the CODE STATUS at that time.   Adrian Prows, MD, [facility] 11/03/2021, 3:15 PM Office: [phone] Fax: [phone] Pager: [phone]

## 2021-11-04 ENCOUNTER — Inpatient Hospital Stay (HOSPITAL_COMMUNITY)
Admit: 2021-11-04 | Discharge: 2021-11-04 | Disposition: A | Discharge status: Home / Self Care | Payer: Medicare Other | Attending: Cardiology | Admitting: Cardiology

## 2021-11-04 ENCOUNTER — Inpatient Hospital Stay (HOSPITAL_COMMUNITY): Payer: Medicare Other | Admitting: Certified Registered"

## 2021-11-04 ENCOUNTER — Encounter (HOSPITAL_COMMUNITY): Payer: Self-pay | Admitting: Internal Medicine

## 2021-11-04 ENCOUNTER — Inpatient Hospital Stay (HOSPITAL_COMMUNITY): Payer: Medicare Other

## 2021-11-04 ENCOUNTER — Encounter (HOSPITAL_COMMUNITY)
Admission: EM | Disposition: A | Discharge status: Skilled Nursing Facility | Payer: Self-pay | Source: Home / Self Care | Attending: Cardiology

## 2021-11-04 HISTORY — PX: TEE WITHOUT CARDIOVERSION: SHX5443

## 2021-11-04 LAB — BASIC METABOLIC PANEL
Anion gap: 8 (ref 5–15)
BUN: 29 mg/dL — ABNORMAL HIGH (ref 8–23)
CO2: 22 mmol/L (ref 22–32)
Calcium: 8.8 mg/dL — ABNORMAL LOW (ref 8.9–10.3)
Chloride: 98 mmol/L (ref 98–111)
Creatinine, Ser: 1.9 mg/dL — ABNORMAL HIGH (ref 0.44–1.00)
GFR, Estimated: 25 mL/min — ABNORMAL LOW (ref >60)
Glucose, Bld: 102 mg/dL — ABNORMAL HIGH (ref 70–99)
Potassium: 4.5 mmol/L (ref 3.5–5.1)
Sodium: 128 mmol/L — ABNORMAL LOW (ref 135–145)

## 2021-11-04 LAB — HEMOGLOBIN AND HEMATOCRIT, BLOOD
HCT: 26.4 % — ABNORMAL LOW (ref 36.0–46.0)
Hemoglobin: 8.8 g/dL — ABNORMAL LOW (ref 12.0–15.0)

## 2021-11-04 LAB — BRAIN NATRIURETIC PEPTIDE: B Natriuretic Peptide: 512.9 pg/mL — ABNORMAL HIGH (ref 0.0–100.0)

## 2021-11-04 LAB — COOXEMETRY PANEL
Carboxyhemoglobin: 1 % (ref 0.5–1.5)
Methemoglobin: 0.8 % (ref 0.0–1.5)
O2 Saturation: 64.2 %
Total hemoglobin: 9.2 g/dL — ABNORMAL LOW (ref 12.0–16.0)

## 2021-11-04 LAB — SURGICAL PCR SCREEN
MRSA, PCR: NEGATIVE
Staphylococcus aureus: NEGATIVE

## 2021-11-04 SURGERY — ECHOCARDIOGRAM, TRANSESOPHAGEAL
Anesthesia: Monitor Anesthesia Care

## 2021-11-04 MED ORDER — PROPOFOL 500 MG/50ML IV EMUL
INTRAVENOUS | Status: DC | PRN
  Administered 2021-11-04: 100 ug/kg/min via INTRAVENOUS

## 2021-11-04 MED ORDER — MUPIROCIN 2 % EX OINT
1.0000 "application " | TOPICAL_OINTMENT | Freq: Two times a day (BID) | CUTANEOUS | Status: AC
  Administered 2021-11-04 – 2021-11-09 (×10): 1 via NASAL
  Filled 2021-11-04 (×2): qty 22

## 2021-11-04 MED ORDER — FUROSEMIDE 10 MG/ML IJ SOLN
40.0000 mg | Freq: Every day | INTRAMUSCULAR | Status: DC
  Administered 2021-11-04 – 2021-11-05 (×2): 40 mg via INTRAVENOUS
  Filled 2021-11-04 (×2): qty 4

## 2021-11-04 MED ORDER — ORAL CARE MOUTH RINSE
15.0000 mL | Freq: Two times a day (BID) | OROMUCOSAL | Status: DC
  Administered 2021-11-04 – 2021-11-11 (×13): 15 mL via OROMUCOSAL

## 2021-11-04 NOTE — Progress Notes (Signed)
Electrophysiology Rounding Note  Patient Name: Deborah Jordan Date of Encounter: 11/04/2021  Primary Cardiologist: Dr. Einar Gip Electrophysiologist: Dr. Quentin Ore   Subjective   NAEO  Tentatively for TEE today to further clarify MR  Inpatient Medications    Scheduled Meds:  aspirin EC  81 mg Oral Daily   Chlorhexidine Gluconate Cloth  6 each Topical Daily   DULoxetine  20 mg Oral Daily   enoxaparin (LOVENOX) injection  30 mg Subcutaneous Q24H   gabapentin  100 mg Oral QHS   magnesium oxide  400 mg Oral BID   pantoprazole  40 mg Oral Daily   potassium chloride  40 mEq Oral BID   sodium chloride flush  3 mL Intravenous Q12H   sodium chloride flush  3 mL Intravenous Q12H   sodium chloride flush  3 mL Intravenous Q12H   sodium chloride  1 g Oral BID WC   tamsulosin  0.4 mg Oral QPC supper   trimethoprim  100 mg Oral Daily   Continuous Infusions:  sodium chloride Stopped (11/03/21 2049)   sodium chloride Stopped (11/03/21 2052)   sodium chloride     sodium chloride 20 mL/hr at 11/03/21 2050   sodium chloride     PRN Meds: sodium chloride, sodium chloride, sodium chloride, acetaminophen, albuterol, LORazepam, melatonin, ondansetron (ZOFRAN) IV, sodium chloride flush, sodium chloride flush, sodium chloride flush   Vital Signs    Vitals:   11/04/21 0500 11/04/21 0600 11/04/21 0633 11/04/21 0700  BP: (!) 154/68 (!) 156/55  (!) 149/60  Pulse: 71 (!) 38 69 (!) 38  Resp: (!) 21 (!) 24 19 17   Temp:   97.8 F (36.6 C)   TempSrc:   Oral   SpO2: 93% 92% 95% 99%  Weight: 81.2 kg     Height:        Intake/Output Summary (Last 24 hours) at 11/04/2021 0938 Last data filed at 11/04/2021 0620 Gross per 24 hour  Intake 141.05 ml  Output 850 ml  Net -708.95 ml   Filed Weights   11/01/21 0332 11/02/21 0513 11/04/21 0500  Weight: 83.3 kg 82.6 kg 81.2 kg    Physical Exam    GEN- The patient is more alert appearing, alert and oriented x 3 today.   Head- normocephalic,  atraumatic Eyes-  Sclera clear, conjunctiva pink Ears- hearing intact Oropharynx- clear Neck- supple Lungs- Clear to ausculation bilaterally, normal work of breathing Heart- Regular rate and rhythm, no murmurs, rubs or gallops GI- soft, NT, ND, + BS Extremities- no clubbing or cyanosis. No edema Skin- no rash or lesion Psych- euthymic mood, full affect Neuro- strength and sensation are intact  Labs    CBC Recent Labs    11/02/21 0338 11/02/21 0931 11/03/21 0443 11/03/21 1657  WBC 11.1*  --  8.3  --   NEUTROABS 8.7*  --   --   --   HGB 9.4*   < > 8.1* 8.7*  HCT 26.9*   < > 23.7* 25.0*  MCV 84.1  --  84.6  --   PLT 270  --  279  --    < > = values in this interval not displayed.   Basic Metabolic Panel Recent Labs    11/02/21 0338 11/02/21 0931 11/03/21 1657 11/04/21 0636  NA 127*   < > 127* 128*  K 4.6   < > 3.9 4.5  CL 96*   < > 95* 98  CO2 19*   < > 21* 22  GLUCOSE  82   < > 117* 102*  BUN 24*   < > 30* 29*  CREATININE 1.84*   < > 2.10* 1.90*  CALCIUM 8.8*   < > 8.6* 8.8*  MG 2.1  --   --   --    < > = values in this interval not displayed.   Liver Function Tests No results for input(s): AST, ALT, ALKPHOS, BILITOT, PROT, ALBUMIN in the last 72 hours. No results for input(s): LIPASE, AMYLASE in the last 72 hours. Cardiac Enzymes No results for input(s): CKTOTAL, CKMB, CKMBINDEX, TROPONINI in the last 72 hours.   Telemetry    V paced 60s (personally reviewed)  Radiology    DG CHEST PORT 1 VIEW  Result Date: 11/03/2021 CLINICAL DATA:  Chest pain. EXAM: PORTABLE CHEST 1 VIEW COMPARISON:  October 31, 2021 FINDINGS: A right internal jugular pacer wire is in place. Mild atelectasis is seen within the retrocardiac region of the left lung base. There is no evidence of a pleural effusion or pneumothorax. The cardiac silhouette is enlarged and unchanged in size. Degenerative changes seen throughout the thoracic spine. IMPRESSION: Stable cardiomegaly with mild  left basilar atelectasis. Electronically Signed   By: Virgina Norfolk M.D.   On: 11/03/2021 17:02   ECHOCARDIOGRAM COMPLETE  Result Date: 11/02/2021    ECHOCARDIOGRAM REPORT   Patient Name:   Deborah Jordan [facility] Date of Exam: 11/02/2021 Medical Rec #:  [id number]     Height:       57.0 in Accession #:    [id number]    Weight:       182.1 lb Date of Birth:  April 04, 1933     BSA:          1.728 m Patient Age:    87 years      BP:           175/81 mmHg Patient Gender: F             HR:           81 bpm. Exam Location:  Inpatient Procedure: 2D Echo Indications:     dyspnea. respiratory distress  History:         Patient has prior history of Echocardiogram examinations, most                  recent 09/24/2021. CHF, Arrythmias:external pacer; Risk                  Factors:Hypertension.  Sonographer:     Johny Chess RDCS Referring Phys:  [id number] Adrian Prows Diagnosing Phys: Adrian Prows MD  Sonographer Comments: Image acquisition challenging due to respiratory motion. IMPRESSIONS  1. Left ventricular ejection fraction, by estimation, is 65 to 70%. The left ventricle has normal function. Left ventricular endocardial border not optimally defined to evaluate regional wall motion. There is mild concentric left ventricular hypertrophy. Left ventricular diastolic parameters are indeterminate.  2. Right ventricular systolic function is normal. The right ventricular size is normal.  3. Left atrial size was severely dilated.  4. The mitral valve is myxomatous. Mild mitral valve regurgitation. There is moderate late systolic prolapse of both leaflets of the mitral valve.  5. Elevated velocity due to hyperdynamic LVEF. The aortic valve is normal in structure. Aortic valve regurgitation is not visualized. No aortic stenosis is present.  6. The inferior vena cava is dilated in size with <50% respiratory variability, suggesting right atrial pressure of 15 mmHg. Comparison(s): No significant change from prior study. 09/24/2021.  FINDINGS  Left  Ventricle: Diastolic function not assessed due to paced rhythm. Left ventricular ejection fraction, by estimation, is 65 to 70%. The left ventricle has normal function. Left ventricular endocardial border not optimally defined to evaluate regional wall motion. The left ventricular internal cavity size was normal in size. There is mild concentric left ventricular hypertrophy. Left ventricular diastolic parameters are indeterminate. Right Ventricle: The right ventricular size is normal. No increase in right ventricular wall thickness. Right ventricular systolic function is normal. Left Atrium: Left atrial size was severely dilated. Right Atrium: Right atrial size was normal in size. Pericardium: There is no evidence of pericardial effusion. Mitral Valve: The mitral valve is myxomatous. There is moderate late systolic prolapse of both leaflets of the mitral valve. There is mild thickening of the mitral valve leaflet(s). Mild mitral valve regurgitation. Tricuspid Valve: The tricuspid valve is normal in structure. Tricuspid valve regurgitation is not demonstrated. No evidence of tricuspid stenosis. Aortic Valve: Elevated velocity due to hyperdynamic LVEF. The aortic valve is normal in structure. Aortic valve regurgitation is not visualized. No aortic stenosis is present. Aortic valve mean gradient measures 14.0 mmHg. Aortic valve peak gradient measures 25.4 mmHg. Pulmonic Valve: The pulmonic valve was normal in structure. Pulmonic valve regurgitation is not visualized. No evidence of pulmonic stenosis. Aorta: The aortic root is normal in size and structure. Venous: The inferior vena cava is dilated in size with less than 50% respiratory variability, suggesting right atrial pressure of 15 mmHg. IAS/Shunts: No atrial level shunt detected by color flow Doppler. Additional Comments: A device lead is visualized.  LEFT VENTRICLE PLAX 2D LVIDd:         4.80 cm LVIDs:         2.60 cm LV PW:         1.20 cm LV IVS:        1.20 cm   RIGHT VENTRICLE TAPSE (M-mode): 2.2 cm LEFT ATRIUM             Index        RIGHT ATRIUM           Index LA diam:        4.60 cm 2.66 cm/m   RA Area:     13.40 cm LA Vol (A2C):   50.1 ml 28.99 ml/m  RA Volume:   29.25 ml  16.92 ml/m LA Vol (A4C):   70.1 ml 40.56 ml/m LA Biplane Vol: 59.1 ml 34.19 ml/m  AORTIC VALVE AV Vmax:      252.00 cm/s AV Vmean:     171.000 cm/s AV VTI:       0.424 m AV Peak Grad: 25.4 mmHg AV Mean Grad: 14.0 mmHg  AORTA Ao Asc diam: 3.30 cm Adrian Prows MD Electronically signed by Adrian Prows MD Signature Date/Time: 11/02/2021/10:03:52 PM    Final     Patient Profile     Deborah Jordan is a 87 y.o. female with a history of HTN, CKD, HLD, bifascicular block, and hyponatremia who is being seen today for the evaluation of bradycardia and heart failure at the request of Dr. Telford Nab.  Assessment & Plan    #Complete heart block, advanced AV block It is not felt that her conduction disease is causing her respiratory distress.  RHC and TTE showed discordant MR. TEE planned to further characterize.    #Acute respiratory failure Continues to improve. Marland Kitchen RHC showed acute decompensated CHF with pulmonary edema BCx NG x 24 hrs  For questions or  updates, please contact [place] Please consult [email or web address] for contact info under Cardiology/STEMI.  Signed, Shirley Friar, PA-C  11/04/2021, 9:38 AM

## 2021-11-04 NOTE — Progress Notes (Signed)
  Echocardiogram Echocardiogram Transesophageal has been performed.  Fidel Levy 11/04/2021, 1:19 PM

## 2021-11-04 NOTE — Progress Notes (Signed)
Heart Failure Navigator Progress Note  Assessed for Heart & Vascular TOC clinic readiness.  Patient does not meet criteria due to AHF rounding team consulted this hospitalization.   Navigator available for reassessment of patient.   Cosmo Tetreault, MSN, RN Heart Failure Nurse Navigator [phone]   

## 2021-11-04 NOTE — Progress Notes (Signed)
Subjective:  She is presently doing well, has not noted any BiPAP, has been using CPAP during sleep.  Presently on 4 L nasal cannula oxygen.  Intake/Output from previous day:  I/O last 3 completed shifts: In: 268 [I.V.:268] Out: 1150 [Urine:1150] No intake/output data recorded.  Blood pressure (!) 149/60, pulse (!) 38, temperature 97.8 F (36.6 C), temperature source Oral, resp. rate 17, height 4' 9" (1.448 m), weight 81.2 kg, SpO2 99 %.  Filed Weights   11/01/21 0332 11/02/21 0513 11/04/21 0500  Weight: 83.3 kg 82.6 kg 81.2 kg     Vitals with BMI 11/04/2021 11/04/2021 11/04/2021  Height - - -  Weight - - -  BMI - - -  Systolic 641 - 583  Diastolic 60 - 55  Pulse 38 69 38    Physical Exam Constitutional:      General: She is not in acute distress.    Appearance: She is obese.     Interventions: Face mask in place.  Eyes:     Extraocular Movements: Extraocular movements intact.  Neck:     Vascular: No carotid bruit or JVD.  Cardiovascular:     Rate and Rhythm: Normal rate and regular rhythm.     Heart sounds: Murmur heard.  Crescendo-decrescendo mid to late systolic murmur is present with a grade of 3/6 at the apex.  Pulmonary:     Effort: No accessory muscle usage or respiratory distress.     Breath sounds: Decreased air movement present. Examination of the right-middle field reveals rales. Examination of the left-middle field reveals rales. Examination of the right-lower field reveals rales. Examination of the left-lower field reveals rales. Rales present.  Abdominal:     General: Abdomen is flat. Bowel sounds are normal.     Palpations: Abdomen is soft.  Musculoskeletal:     Right lower leg: No edema.     Left lower leg: No edema.  Skin:    General: Skin is warm.     Capillary Refill: Capillary refill takes less than 2 seconds.  Neurological:     General: No focal deficit present.     Mental Status: She is alert and oriented to person, place, and time.   Psychiatric:        Behavior: Behavior is cooperative.    Lab Results: ProBNP (last 3 results) No results for input(s): PROBNP in the last 8760 hours. BMP Latest Ref Rng & Units 11/04/2021 11/03/2021 11/03/2021  Glucose 70 - 99 mg/dL 102(H) 117(H) 104(H)  BUN 8 - 23 mg/dL 29(H) 30(H) 29(H)  Creatinine 0.44 - 1.00 mg/dL 1.90(H) 2.10(H) 1.89(H)  Sodium 135 - 145 mmol/L 128(L) 127(L) 129(L)  Potassium 3.5 - 5.1 mmol/L 4.5 3.9 3.4(L)  Chloride 98 - 111 mmol/L 98 95(L) 97(L)  CO2 22 - 32 mmol/L 22 21(L) 22  Calcium 8.9 - 10.3 mg/dL 8.8(L) 8.6(L) 8.5(L)   Hepatic Function Latest Ref Rng & Units 10/31/2021 09/30/2021 09/26/2021  Total Protein 6.5 - 8.1 g/dL 6.5 5.6(L) 5.1(L)  Albumin 3.5 - 5.0 g/dL 3.7 2.4(L) 2.2(L)  AST 15 - 41 U/L 20 21 33  ALT 0 - 44 U/L 13 30 114(H)  Alk Phosphatase 38 - 126 U/L 67 114 191(H)  Total Bilirubin 0.3 - 1.2 mg/dL 0.7 0.8 0.8  Bilirubin, Direct 0.0 - 0.2 mg/dL - - -   CBC Latest Ref Rng & Units 11/03/2021 11/03/2021 11/02/2021  WBC 4.0 - 10.5 K/uL - 8.3 -  Hemoglobin 12.0 - 15.0 g/dL 8.7(L) 8.1(L) 9.5(L)  Hematocrit 36.0 - 46.0 % 25.0(L) 23.7(L) 28.0(L)  Platelets 150 - 400 K/uL - 279 -   BNP (last 3 results) Recent Labs    10/31/21 0919 11/03/21 1657 11/04/21 0636  BNP 525.0* 597.9* 512.9*     ProBNP (last 3 results) No results for input(s): PROBNP in the last 8760 hours.  Lipid Panel  No results found for: CHOL, TRIG, HDL, CHOLHDL, VLDL, LDLCALC, LDLDIRECT Cardiac Panel (last 3 results) No results for input(s): CKTOTAL, CKMB, TROPONINI, RELINDX in the last 72 hours.  HEMOGLOBIN A1C No results found for: HGBA1C, MPG TSH Recent Labs    06/19/21 0319 10/31/21 0919  TSH 1.872 1.355    Imaging: CARDIAC CATHETERIZATION  Result Date: 11/02/2021 Right heart catheterization and temporary transvenous pacemaker implantation via right IJ 11/02/2021: RA: 21/20, mean 16 mmHg. RV 46/16, EDP 22 mmHg. PA 61/18, mean 35 mmHg.  PA saturation  72%. PW 43/22, mean 23 mmHg.  Giant V waves noted suggestive of severe MR. CO 7.49, CI 4.33 by Fick.  QP/QS 1.00. TPVR 8.08 Wood units.  PVR 2.77 Wood units. SVR 900 dynes. Temporary transvenous pacemaker placed into the right ventricular apex, capture obtained at 1 mV.  30 cm from IJ, settings at 80 bpm asynchronous at 5 mA. Impression: Findings are consistent with acute decompensated heart failure with pulmonary edema secondary to severe mitral regurgitation.  We will transfer to the unit and try to diurese her and use vasodilator therapy.  If she does not respond, we may have to consider mechanical support.   DG CHEST PORT 1 VIEW  Result Date: 11/03/2021 CLINICAL DATA:  Chest pain. EXAM: PORTABLE CHEST 1 VIEW COMPARISON:  October 31, 2021 FINDINGS: A right internal jugular pacer wire is in place. Mild atelectasis is seen within the retrocardiac region of the left lung base. There is no evidence of a pleural effusion or pneumothorax. The cardiac silhouette is enlarged and unchanged in size. Degenerative changes seen throughout the thoracic spine. IMPRESSION: Stable cardiomegaly with mild left basilar atelectasis. Electronically Signed   By: Virgina Norfolk M.D.   On: 11/03/2021 17:02   ECHOCARDIOGRAM COMPLETE  Result Date: 11/02/2021    ECHOCARDIOGRAM REPORT   Patient Name:   Deborah Jordan [facility] Date of Exam: 11/02/2021 Medical Rec #:  [id number]     Height:       57.0 in Accession #:    [id number]    Weight:       182.1 lb Date of Birth:  January 20, 1933     BSA:          1.728 m Patient Age:    54 years      BP:           175/81 mmHg Patient Gender: F             HR:           81 bpm. Exam Location:  Inpatient Procedure: 2D Echo Indications:     dyspnea. respiratory distress  History:         Patient has prior history of Echocardiogram examinations, most                  recent 09/24/2021. CHF, Arrythmias:external pacer; Risk                  Factors:Hypertension.  Sonographer:     Johny Chess RDCS  Referring Phys:  [id number] Adrian Prows Diagnosing Phys: Adrian Prows MD  Sonographer Comments: Image acquisition challenging due to respiratory  motion. IMPRESSIONS  1. Left ventricular ejection fraction, by estimation, is 65 to 70%. The left ventricle has normal function. Left ventricular endocardial border not optimally defined to evaluate regional wall motion. There is mild concentric left ventricular hypertrophy. Left ventricular diastolic parameters are indeterminate.  2. Right ventricular systolic function is normal. The right ventricular size is normal.  3. Left atrial size was severely dilated.  4. The mitral valve is myxomatous. Mild mitral valve regurgitation. There is moderate late systolic prolapse of both leaflets of the mitral valve.  5. Elevated velocity due to hyperdynamic LVEF. The aortic valve is normal in structure. Aortic valve regurgitation is not visualized. No aortic stenosis is present.  6. The inferior vena cava is dilated in size with <50% respiratory variability, suggesting right atrial pressure of 15 mmHg. Comparison(s): No significant change from prior study. 09/24/2021. FINDINGS  Left Ventricle: Diastolic function not assessed due to paced rhythm. Left ventricular ejection fraction, by estimation, is 65 to 70%. The left ventricle has normal function. Left ventricular endocardial border not optimally defined to evaluate regional wall motion. The left ventricular internal cavity size was normal in size. There is mild concentric left ventricular hypertrophy. Left ventricular diastolic parameters are indeterminate. Right Ventricle: The right ventricular size is normal. No increase in right ventricular wall thickness. Right ventricular systolic function is normal. Left Atrium: Left atrial size was severely dilated. Right Atrium: Right atrial size was normal in size. Pericardium: There is no evidence of pericardial effusion. Mitral Valve: The mitral valve is myxomatous. There is moderate late systolic  prolapse of both leaflets of the mitral valve. There is mild thickening of the mitral valve leaflet(s). Mild mitral valve regurgitation. Tricuspid Valve: The tricuspid valve is normal in structure. Tricuspid valve regurgitation is not demonstrated. No evidence of tricuspid stenosis. Aortic Valve: Elevated velocity due to hyperdynamic LVEF. The aortic valve is normal in structure. Aortic valve regurgitation is not visualized. No aortic stenosis is present. Aortic valve mean gradient measures 14.0 mmHg. Aortic valve peak gradient measures 25.4 mmHg. Pulmonic Valve: The pulmonic valve was normal in structure. Pulmonic valve regurgitation is not visualized. No evidence of pulmonic stenosis. Aorta: The aortic root is normal in size and structure. Venous: The inferior vena cava is dilated in size with less than 50% respiratory variability, suggesting right atrial pressure of 15 mmHg. IAS/Shunts: No atrial level shunt detected by color flow Doppler. Additional Comments: A device lead is visualized.  LEFT VENTRICLE PLAX 2D LVIDd:         4.80 cm LVIDs:         2.60 cm LV PW:         1.20 cm LV IVS:        1.20 cm  RIGHT VENTRICLE TAPSE (M-mode): 2.2 cm LEFT ATRIUM             Index        RIGHT ATRIUM           Index LA diam:        4.60 cm 2.66 cm/m   RA Area:     13.40 cm LA Vol (A2C):   50.1 ml 28.99 ml/m  RA Volume:   29.25 ml  16.92 ml/m LA Vol (A4C):   70.1 ml 40.56 ml/m LA Biplane Vol: 59.1 ml 34.19 ml/m  AORTIC VALVE AV Vmax:      252.00 cm/s AV Vmean:     171.000 cm/s AV VTI:       0.424 m AV Peak  Grad: 25.4 mmHg AV Mean Grad: 14.0 mmHg  AORTA Ao Asc diam: 3.30 cm Gurpreet Mikhail MD Electronically signed by Timmy Cleverly MD Signature Date/Time: 11/02/2021/10:03:52 PM    Final     Cardiac Studies:  EKG:   Echocardiogram 09/24/2021:    1. Left ventricular ejection fraction, by estimation, is 55 to 60%. The left ventricle has normal function. The left ventricle has no regional wall motion abnormalities.  2. Right  ventricular systolic function is normal. The right ventricular size is normal.  3. Left atrial size was mildly dilated.  4. Mild mitral valve regurgitation. There is moderate holosystolic prolapse of the middle scallop of the posterior leaflet of the mitral valve.  5. The aortic valve is calcified. Aortic valve regurgitation is not visualized.   Conclusion(s)/Recommendation(s): No evidence of valvular vegetations on this transthoracic echocardiogram. Would recommend a transesophageal echocardiogram to exclude infective endocarditis if clinically indicated.   Carotid artery duplex 07/06/2021: Duplex suggests stenosis in the right internal carotid artery (1-15%). Duplex suggests stenosis in the left internal carotid artery (16-49%). Duplex suggests stenosis in the left external carotid artery (<50%). Antegrade right vertebral artery flow. Antegrade left vertebral artery flow. Follow up in one year is appropriate if clinically indicated.  Right heart catheterization and temporary transvenous pacemaker implantation via right IJ 11/02/2021: RA: 21/20, mean 16 mmHg. RV 46/16, EDP 22 mmHg. PA 61/18, mean 35 mmHg.  PA saturation 72%. PW 43/22, mean 23 mmHg.  Giant V waves noted suggestive of severe MR. CO 7.49, CI 4.33 by Fick.  QP/QS 1.00. TPVR 8.08 Wood units.  PVR 2.77 Wood units. SVR 900 dynes.  Temporary transvenous pacemaker placed into the right ventricular apex, capture obtained at 1 mV.  30 cm from IJ, settings at 80 bpm asynchronous at 5 mA.  Impression: Findings are consistent with acute decompensated heart failure with pulmonary edema secondary to severe mitral regurgitation.  We will transfer to the unit and try to diurese her and use vasodilator therapy.  If she does not respond, we may have to consider mechanical support.  Echocardiogram 11/02/2021:    1. Left ventricular ejection fraction, by estimation, is 65 to 70%. The left ventricle has normal function. Left ventricular  endocardial border not optimally defined to evaluate regional wall motion. There is mild concentric left ventricular  hypertrophy. Left ventricular diastolic parameters are indeterminate.  2. Right ventricular systolic function is normal. The right ventricular size is normal.  3. Left atrial size was severely dilated.  4. The mitral valve is myxomatous. Mild mitral valve regurgitation. There is moderate late systolic prolapse of both leaflets of the mitral valve.  5. Elevated velocity due to hyperdynamic LVEF. The aortic valve is normal in structure. Aortic valve regurgitation is not visualized. No aortic stenosis is present.  6. The inferior vena cava is dilated in size with <50% respiratory variability, suggesting right atrial pressure of 15 mmHg.   Scheduled Meds:  aspirin EC  81 mg Oral Daily   Chlorhexidine Gluconate Cloth  6 each Topical Daily   DULoxetine  20 mg Oral Daily   enoxaparin (LOVENOX) injection  30 mg Subcutaneous Q24H   gabapentin  100 mg Oral QHS   magnesium oxide  400 mg Oral BID   pantoprazole  40 mg Oral Daily   potassium chloride  40 mEq Oral BID   sodium chloride flush  3 mL Intravenous Q12H   sodium chloride flush  3 mL Intravenous Q12H   sodium chloride flush  3 mL Intravenous   Q12H   sodium chloride  1 g Oral BID WC   tamsulosin  0.4 mg Oral QPC supper   trimethoprim  100 mg Oral Daily   Continuous Infusions:  sodium chloride Stopped (11/03/21 2049)   sodium chloride Stopped (11/03/21 2052)   sodium chloride     sodium chloride 20 mL/hr at 11/03/21 2050   sodium chloride     PRN Meds:.sodium chloride, sodium chloride, sodium chloride, acetaminophen, albuterol, LORazepam, melatonin, ondansetron (ZOFRAN) IV, sodium chloride flush, sodium chloride flush, sodium chloride flush  Assessment/Plan:   Ikran K Corey  is a 88 y.o. with hypertension, hyperlipidemia, history of chronic kidney disease, hyperglycemia, bifascicular block on EKG, chronic palpitations  which are improved with beta-blocker therapy.  She was last admitted in July 2022 with hyponatremia due to excessive fluid intake as she was having dysphagia for solid foods.  She has recuperated well from this, again admitted in October 2022 a month ago for urosepsis.  Due to marked asymptomatic sinus bradycardia, on 10/28/2021, carvedilol discontinued and I reduced the dose of amlodipine from 10 mg to 5 mg daily.  Patient now admitted with acute respiratory distress and Mobitz 2 AV block.  1.  Acute pulm edema, acute on chronic diastolic heart failure now appears stable with normal oxygen saturations and Co-ox within normal limits. 2. Severe mitral regurgitation by right heart catheterization and moderate pulmonary hypertension, elevated wedge pressure.  However transthoracic echocardiogram does not suggest significant MR.  Ischemic etiology with papillary muscle dysfunction and MR could also be an etiology, will avoid left heart catheterization for now in view of acute renal failure.  Scheduled for TEE today. 3.  Complete heart block.  Still in complete heart block this morning, pacer functioning normally. 4.  Acute renal failure with acute tubular necrosis secondary to acute circulatory failure, stage IV.  Serum creatinine is improving.  Recommendations:  Patient is still in heart failure, however yesterday she developed anuria and worsening renal function leading to discontinuation of Primacor and also diuretics.  I gave her f IV saline infusion yesterday for 10 hours.  Urine output has picked up, serum creatinine is trending down.  On physical exam, she has continued coarse crackles in bilateral lung bases halfway up.  I am confused whether this is still significant amount of heart failure or she has underlying pulmonary pathology as well.  I will consider CT scan of the chest to evaluate for pulmonary etiology for her lung infiltrates.  All questions have been answered and have spoken to her  daughter regarding the risks that could potentially have come with TEE and they are willing to proceed.    Jarrah Babich, MD, FACC 11/04/2021, 8:40 AM Office: [phone] Fax: [phone] Pager: [phone]  

## 2021-11-04 NOTE — Interval H&P Note (Signed)
History and Physical Interval Note:  11/04/2021 12:45 PM  Deborah Jordan  has presented today for surgery, with the diagnosis of mitral regurgitation.  The various methods of treatment have been discussed with the patient and family. After consideration of risks, benefits and other options for treatment, the patient has consented to  Procedure(s): TRANSESOPHAGEAL ECHOCARDIOGRAM (TEE) (N/A) as a surgical intervention.  The patient's history has been reviewed, patient examined, no change in status, stable for surgery.  I have reviewed the patient's chart and labs.  Questions were answered to the patient's satisfaction.     Adrian Prows

## 2021-11-04 NOTE — H&P (View-Only) (Signed)
Subjective:  She is presently doing well, has not noted any BiPAP, has been using CPAP during sleep.  Presently on 4 L nasal cannula oxygen.  Intake/Output from previous day:  I/O last 3 completed shifts: In: 268 [I.V.:268] Out: 1150 [Urine:1150] No intake/output data recorded.  Blood pressure (!) 149/60, pulse (!) 38, temperature 97.8 F (36.6 C), temperature source Oral, resp. rate 17, height 4' 9" (1.448 m), weight 81.2 kg, SpO2 99 %.  Filed Weights   11/01/21 0332 11/02/21 0513 11/04/21 0500  Weight: 83.3 kg 82.6 kg 81.2 kg     Vitals with BMI 11/04/2021 11/04/2021 11/04/2021  Height - - -  Weight - - -  BMI - - -  Systolic 641 - 583  Diastolic 60 - 55  Pulse 38 69 38    Physical Exam Constitutional:      General: She is not in acute distress.    Appearance: She is obese.     Interventions: Face mask in place.  Eyes:     Extraocular Movements: Extraocular movements intact.  Neck:     Vascular: No carotid bruit or JVD.  Cardiovascular:     Rate and Rhythm: Normal rate and regular rhythm.     Heart sounds: Murmur heard.  Crescendo-decrescendo mid to late systolic murmur is present with a grade of 3/6 at the apex.  Pulmonary:     Effort: No accessory muscle usage or respiratory distress.     Breath sounds: Decreased air movement present. Examination of the right-middle field reveals rales. Examination of the left-middle field reveals rales. Examination of the right-lower field reveals rales. Examination of the left-lower field reveals rales. Rales present.  Abdominal:     General: Abdomen is flat. Bowel sounds are normal.     Palpations: Abdomen is soft.  Musculoskeletal:     Right lower leg: No edema.     Left lower leg: No edema.  Skin:    General: Skin is warm.     Capillary Refill: Capillary refill takes less than 2 seconds.  Neurological:     General: No focal deficit present.     Mental Status: She is alert and oriented to person, place, and time.   Psychiatric:        Behavior: Behavior is cooperative.    Lab Results: ProBNP (last 3 results) No results for input(s): PROBNP in the last 8760 hours. BMP Latest Ref Rng & Units 11/04/2021 11/03/2021 11/03/2021  Glucose 70 - 99 mg/dL 102(H) 117(H) 104(H)  BUN 8 - 23 mg/dL 29(H) 30(H) 29(H)  Creatinine 0.44 - 1.00 mg/dL 1.90(H) 2.10(H) 1.89(H)  Sodium 135 - 145 mmol/L 128(L) 127(L) 129(L)  Potassium 3.5 - 5.1 mmol/L 4.5 3.9 3.4(L)  Chloride 98 - 111 mmol/L 98 95(L) 97(L)  CO2 22 - 32 mmol/L 22 21(L) 22  Calcium 8.9 - 10.3 mg/dL 8.8(L) 8.6(L) 8.5(L)   Hepatic Function Latest Ref Rng & Units 10/31/2021 09/30/2021 09/26/2021  Total Protein 6.5 - 8.1 g/dL 6.5 5.6(L) 5.1(L)  Albumin 3.5 - 5.0 g/dL 3.7 2.4(L) 2.2(L)  AST 15 - 41 U/L 20 21 33  ALT 0 - 44 U/L 13 30 114(H)  Alk Phosphatase 38 - 126 U/L 67 114 191(H)  Total Bilirubin 0.3 - 1.2 mg/dL 0.7 0.8 0.8  Bilirubin, Direct 0.0 - 0.2 mg/dL - - -   CBC Latest Ref Rng & Units 11/03/2021 11/03/2021 11/02/2021  WBC 4.0 - 10.5 K/uL - 8.3 -  Hemoglobin 12.0 - 15.0 g/dL 8.7(L) 8.1(L) 9.5(L)  Hematocrit 36.0 - 46.0 % 25.0(L) 23.7(L) 28.0(L)  Platelets 150 - 400 K/uL - 279 -   BNP (last 3 results) Recent Labs    10/31/21 0919 11/03/21 1657 11/04/21 0636  BNP 525.0* 597.9* 512.9*     ProBNP (last 3 results) No results for input(s): PROBNP in the last 8760 hours.  Lipid Panel  No results found for: CHOL, TRIG, HDL, CHOLHDL, VLDL, LDLCALC, LDLDIRECT Cardiac Panel (last 3 results) No results for input(s): CKTOTAL, CKMB, TROPONINI, RELINDX in the last 72 hours.  HEMOGLOBIN A1C No results found for: HGBA1C, MPG TSH Recent Labs    06/19/21 0319 10/31/21 0919  TSH 1.872 1.355    Imaging: CARDIAC CATHETERIZATION  Result Date: 11/02/2021 Right heart catheterization and temporary transvenous pacemaker implantation via right IJ 11/02/2021: RA: 21/20, mean 16 mmHg. RV 46/16, EDP 22 mmHg. PA 61/18, mean 35 mmHg.  PA saturation  72%. PW 43/22, mean 23 mmHg.  Giant V waves noted suggestive of severe MR. CO 7.49, CI 4.33 by Fick.  QP/QS 1.00. TPVR 8.08 Wood units.  PVR 2.77 Wood units. SVR 900 dynes. Temporary transvenous pacemaker placed into the right ventricular apex, capture obtained at 1 mV.  30 cm from IJ, settings at 80 bpm asynchronous at 5 mA. Impression: Findings are consistent with acute decompensated heart failure with pulmonary edema secondary to severe mitral regurgitation.  We will transfer to the unit and try to diurese her and use vasodilator therapy.  If she does not respond, we may have to consider mechanical support.   DG CHEST PORT 1 VIEW  Result Date: 11/03/2021 CLINICAL DATA:  Chest pain. EXAM: PORTABLE CHEST 1 VIEW COMPARISON:  October 31, 2021 FINDINGS: A right internal jugular pacer wire is in place. Mild atelectasis is seen within the retrocardiac region of the left lung base. There is no evidence of a pleural effusion or pneumothorax. The cardiac silhouette is enlarged and unchanged in size. Degenerative changes seen throughout the thoracic spine. IMPRESSION: Stable cardiomegaly with mild left basilar atelectasis. Electronically Signed   By: Virgina Norfolk M.D.   On: 11/03/2021 17:02   ECHOCARDIOGRAM COMPLETE  Result Date: 11/02/2021    ECHOCARDIOGRAM REPORT   Patient Name:   Deborah Jordan [facility] Date of Exam: 11/02/2021 Medical Rec #:  [id number]     Height:       57.0 in Accession #:    [id number]    Weight:       182.1 lb Date of Birth:  January 20, 1933     BSA:          1.728 m Patient Age:    54 years      BP:           175/81 mmHg Patient Gender: F             HR:           81 bpm. Exam Location:  Inpatient Procedure: 2D Echo Indications:     dyspnea. respiratory distress  History:         Patient has prior history of Echocardiogram examinations, most                  recent 09/24/2021. CHF, Arrythmias:external pacer; Risk                  Factors:Hypertension.  Sonographer:     Johny Chess RDCS  Referring Phys:  [id number] Adrian Prows Diagnosing Phys: Adrian Prows MD  Sonographer Comments: Image acquisition challenging due to respiratory  motion. IMPRESSIONS  1. Left ventricular ejection fraction, by estimation, is 65 to 70%. The left ventricle has normal function. Left ventricular endocardial border not optimally defined to evaluate regional wall motion. There is mild concentric left ventricular hypertrophy. Left ventricular diastolic parameters are indeterminate.  2. Right ventricular systolic function is normal. The right ventricular size is normal.  3. Left atrial size was severely dilated.  4. The mitral valve is myxomatous. Mild mitral valve regurgitation. There is moderate late systolic prolapse of both leaflets of the mitral valve.  5. Elevated velocity due to hyperdynamic LVEF. The aortic valve is normal in structure. Aortic valve regurgitation is not visualized. No aortic stenosis is present.  6. The inferior vena cava is dilated in size with <50% respiratory variability, suggesting right atrial pressure of 15 mmHg. Comparison(s): No significant change from prior study. 09/24/2021. FINDINGS  Left Ventricle: Diastolic function not assessed due to paced rhythm. Left ventricular ejection fraction, by estimation, is 65 to 70%. The left ventricle has normal function. Left ventricular endocardial border not optimally defined to evaluate regional wall motion. The left ventricular internal cavity size was normal in size. There is mild concentric left ventricular hypertrophy. Left ventricular diastolic parameters are indeterminate. Right Ventricle: The right ventricular size is normal. No increase in right ventricular wall thickness. Right ventricular systolic function is normal. Left Atrium: Left atrial size was severely dilated. Right Atrium: Right atrial size was normal in size. Pericardium: There is no evidence of pericardial effusion. Mitral Valve: The mitral valve is myxomatous. There is moderate late systolic  prolapse of both leaflets of the mitral valve. There is mild thickening of the mitral valve leaflet(s). Mild mitral valve regurgitation. Tricuspid Valve: The tricuspid valve is normal in structure. Tricuspid valve regurgitation is not demonstrated. No evidence of tricuspid stenosis. Aortic Valve: Elevated velocity due to hyperdynamic LVEF. The aortic valve is normal in structure. Aortic valve regurgitation is not visualized. No aortic stenosis is present. Aortic valve mean gradient measures 14.0 mmHg. Aortic valve peak gradient measures 25.4 mmHg. Pulmonic Valve: The pulmonic valve was normal in structure. Pulmonic valve regurgitation is not visualized. No evidence of pulmonic stenosis. Aorta: The aortic root is normal in size and structure. Venous: The inferior vena cava is dilated in size with less than 50% respiratory variability, suggesting right atrial pressure of 15 mmHg. IAS/Shunts: No atrial level shunt detected by color flow Doppler. Additional Comments: A device lead is visualized.  LEFT VENTRICLE PLAX 2D LVIDd:         4.80 cm LVIDs:         2.60 cm LV PW:         1.20 cm LV IVS:        1.20 cm  RIGHT VENTRICLE TAPSE (M-mode): 2.2 cm LEFT ATRIUM             Index        RIGHT ATRIUM           Index LA diam:        4.60 cm 2.66 cm/m   RA Area:     13.40 cm LA Vol (A2C):   50.1 ml 28.99 ml/m  RA Volume:   29.25 ml  16.92 ml/m LA Vol (A4C):   70.1 ml 40.56 ml/m LA Biplane Vol: 59.1 ml 34.19 ml/m  AORTIC VALVE AV Vmax:      252.00 cm/s AV Vmean:     171.000 cm/s AV VTI:       0.424 m AV Peak  Grad: 25.4 mmHg AV Mean Grad: 14.0 mmHg  AORTA Ao Asc diam: 3.30 cm Adrian Prows MD Electronically signed by Adrian Prows MD Signature Date/Time: 11/02/2021/10:03:52 PM    Final     Cardiac Studies:  EKG:   Echocardiogram 09/24/2021:    1. Left ventricular ejection fraction, by estimation, is 55 to 60%. The left ventricle has normal function. The left ventricle has no regional wall motion abnormalities.  2. Right  ventricular systolic function is normal. The right ventricular size is normal.  3. Left atrial size was mildly dilated.  4. Mild mitral valve regurgitation. There is moderate holosystolic prolapse of the middle scallop of the posterior leaflet of the mitral valve.  5. The aortic valve is calcified. Aortic valve regurgitation is not visualized.   Conclusion(s)/Recommendation(s): No evidence of valvular vegetations on this transthoracic echocardiogram. Would recommend a transesophageal echocardiogram to exclude infective endocarditis if clinically indicated.   Carotid artery duplex 07/06/2021: Duplex suggests stenosis in the right internal carotid artery (1-15%). Duplex suggests stenosis in the left internal carotid artery (16-49%). Duplex suggests stenosis in the left external carotid artery (<50%). Antegrade right vertebral artery flow. Antegrade left vertebral artery flow. Follow up in one year is appropriate if clinically indicated.  Right heart catheterization and temporary transvenous pacemaker implantation via right IJ 11/02/2021: RA: 21/20, mean 16 mmHg. RV 46/16, EDP 22 mmHg. PA 61/18, mean 35 mmHg.  PA saturation 72%. PW 43/22, mean 23 mmHg.  Giant V waves noted suggestive of severe MR. CO 7.49, CI 4.33 by Fick.  QP/QS 1.00. TPVR 8.08 Wood units.  PVR 2.77 Wood units. SVR 900 dynes.  Temporary transvenous pacemaker placed into the right ventricular apex, capture obtained at 1 mV.  30 cm from IJ, settings at 80 bpm asynchronous at 5 mA.  Impression: Findings are consistent with acute decompensated heart failure with pulmonary edema secondary to severe mitral regurgitation.  We will transfer to the unit and try to diurese her and use vasodilator therapy.  If she does not respond, we may have to consider mechanical support.  Echocardiogram 11/02/2021:    1. Left ventricular ejection fraction, by estimation, is 65 to 70%. The left ventricle has normal function. Left ventricular  endocardial border not optimally defined to evaluate regional wall motion. There is mild concentric left ventricular  hypertrophy. Left ventricular diastolic parameters are indeterminate.  2. Right ventricular systolic function is normal. The right ventricular size is normal.  3. Left atrial size was severely dilated.  4. The mitral valve is myxomatous. Mild mitral valve regurgitation. There is moderate late systolic prolapse of both leaflets of the mitral valve.  5. Elevated velocity due to hyperdynamic LVEF. The aortic valve is normal in structure. Aortic valve regurgitation is not visualized. No aortic stenosis is present.  6. The inferior vena cava is dilated in size with <50% respiratory variability, suggesting right atrial pressure of 15 mmHg.   Scheduled Meds:  aspirin EC  81 mg Oral Daily   Chlorhexidine Gluconate Cloth  6 each Topical Daily   DULoxetine  20 mg Oral Daily   enoxaparin (LOVENOX) injection  30 mg Subcutaneous Q24H   gabapentin  100 mg Oral QHS   magnesium oxide  400 mg Oral BID   pantoprazole  40 mg Oral Daily   potassium chloride  40 mEq Oral BID   sodium chloride flush  3 mL Intravenous Q12H   sodium chloride flush  3 mL Intravenous Q12H   sodium chloride flush  3 mL Intravenous  Q12H   sodium chloride  1 g Oral BID WC   tamsulosin  0.4 mg Oral QPC supper   trimethoprim  100 mg Oral Daily   Continuous Infusions:  sodium chloride Stopped (11/03/21 2049)   sodium chloride Stopped (11/03/21 2052)   sodium chloride     sodium chloride 20 mL/hr at 11/03/21 2050   sodium chloride     PRN Meds:.sodium chloride, sodium chloride, sodium chloride, acetaminophen, albuterol, LORazepam, melatonin, ondansetron (ZOFRAN) IV, sodium chloride flush, sodium chloride flush, sodium chloride flush  Assessment/Plan:   Deborah Jordan  is a 87 y.o. with hypertension, hyperlipidemia, history of chronic kidney disease, hyperglycemia, bifascicular block on EKG, chronic palpitations  which are improved with beta-blocker therapy.  She was last admitted in July 2022 with hyponatremia due to excessive fluid intake as she was having dysphagia for solid foods.  She has recuperated well from this, again admitted in October 2022 a month ago for urosepsis.  Due to marked asymptomatic sinus bradycardia, on 10/28/2021, carvedilol discontinued and I reduced the dose of amlodipine from 10 mg to 5 mg daily.  Patient now admitted with acute respiratory distress and Mobitz 2 AV block.  1.  Acute pulm edema, acute on chronic diastolic heart failure now appears stable with normal oxygen saturations and Co-ox within normal limits. 2. Severe mitral regurgitation by right heart catheterization and moderate pulmonary hypertension, elevated wedge pressure.  However transthoracic echocardiogram does not suggest significant MR.  Ischemic etiology with papillary muscle dysfunction and MR could also be an etiology, will avoid left heart catheterization for now in view of acute renal failure.  Scheduled for TEE today. 3.  Complete heart block.  Still in complete heart block this morning, pacer functioning normally. 4.  Acute renal failure with acute tubular necrosis secondary to acute circulatory failure, stage IV.  Serum creatinine is improving.  Recommendations:  Patient is still in heart failure, however yesterday she developed anuria and worsening renal function leading to discontinuation of Primacor and also diuretics.  I gave her f IV saline infusion yesterday for 10 hours.  Urine output has picked up, serum creatinine is trending down.  On physical exam, she has continued coarse crackles in bilateral lung bases halfway up.  I am confused whether this is still significant amount of heart failure or she has underlying pulmonary pathology as well.  I will consider CT scan of the chest to evaluate for pulmonary etiology for her lung infiltrates.  All questions have been answered and have spoken to her  daughter regarding the risks that could potentially have come with TEE and they are willing to proceed.    Adrian Prows, MD, [facility] 11/04/2021, 8:40 AM Office: [phone] Fax: [phone] Pager: [phone]

## 2021-11-04 NOTE — Transfer of Care (Signed)
Immediate Anesthesia Transfer of Care Note  Patient: Deborah Jordan  Procedure(s) Performed: TRANSESOPHAGEAL ECHOCARDIOGRAM (TEE)  Patient Location: PACU  Anesthesia Type:MAC  Level of Consciousness: awake, alert  and oriented  Airway & Oxygen Therapy: Patient Spontanous Breathing and Patient connected to nasal cannula oxygen  Post-op Assessment: Report given to RN and Post -op Vital signs reviewed and stable  Post vital signs: Reviewed and stable  Last Vitals:  Vitals Value Taken Time  BP    Temp    Pulse    Resp    SpO2      Last Pain:  Vitals:   11/04/21 1120  TempSrc: Other (Comment)  PainSc:          Complications: No notable events documented.

## 2021-11-04 NOTE — Plan of Care (Signed)
  Problem: Education: Goal: Knowledge of General Education information will improve Description: Including pain rating scale, medication(s)/side effects and non-pharmacologic comfort measures 11/04/2021 1117 by Jerral Bonito, RN Outcome: Progressing 11/04/2021 1117 by Jerral Bonito, RN Outcome: Progressing   Problem: Health Behavior/Discharge Planning: Goal: Ability to manage health-related needs will improve 11/04/2021 1117 by Jerral Bonito, RN Outcome: Progressing 11/04/2021 1117 by Jerral Bonito, RN Outcome: Progressing   Problem: Clinical Measurements: Goal: Ability to maintain clinical measurements within normal limits will improve 11/04/2021 1117 by Jerral Bonito, RN Outcome: Progressing 11/04/2021 1117 by Jerral Bonito, RN Outcome: Progressing Goal: Will remain free from infection 11/04/2021 1117 by Jerral Bonito, RN Outcome: Progressing 11/04/2021 1117 by Jerral Bonito, RN Outcome: Progressing Goal: Diagnostic test results will improve 11/04/2021 1117 by Jerral Bonito, RN Outcome: Progressing 11/04/2021 1117 by Jerral Bonito, RN Outcome: Progressing Goal: Respiratory complications will improve 11/04/2021 1117 by Jerral Bonito, RN Outcome: Progressing 11/04/2021 1117 by Jerral Bonito, RN Outcome: Progressing Goal: Cardiovascular complication will be avoided 11/04/2021 1117 by Jerral Bonito, RN Outcome: Progressing 11/04/2021 1117 by Jerral Bonito, RN Outcome: Progressing   Problem: Activity: Goal: Risk for activity intolerance will decrease 11/04/2021 1117 by Jerral Bonito, RN Outcome: Progressing 11/04/2021 1117 by Jerral Bonito, RN Outcome: Progressing   Problem: Nutrition: Goal: Adequate nutrition will be maintained 11/04/2021 1117 by Jerral Bonito, RN Outcome: Progressing 11/04/2021 1117 by Jerral Bonito, RN Outcome: Progressing   Problem: Coping: Goal: Level of anxiety will decrease 11/04/2021 1117 by Jerral Bonito, RN Outcome: Progressing 11/04/2021 1117 by Jerral Bonito, RN Outcome: Progressing   Problem: Elimination: Goal: Will not experience complications related to bowel motility 11/04/2021 1117 by Jerral Bonito, RN Outcome: Progressing 11/04/2021 1117 by Jerral Bonito, RN Outcome: Progressing Goal: Will not experience complications related to urinary retention 11/04/2021 1117 by Jerral Bonito, RN Outcome: Progressing 11/04/2021 1117 by Jerral Bonito, RN Outcome: Progressing   Problem: Pain Managment: Goal: General experience of comfort will improve 11/04/2021 1117 by Jerral Bonito, RN Outcome: Progressing 11/04/2021 1117 by Jerral Bonito, RN Outcome: Progressing   Problem: Safety: Goal: Ability to remain free from injury will improve 11/04/2021 1117 by Jerral Bonito, RN Outcome: Progressing 11/04/2021 1117 by Jerral Bonito, RN Outcome: Progressing   Problem: Skin Integrity: Goal: Risk for impaired skin integrity will decrease 11/04/2021 1117 by Jerral Bonito, RN Outcome: Progressing 11/04/2021 1117 by Jerral Bonito, RN Outcome: Progressing   Problem: Education: Goal: Ability to demonstrate management of disease process will improve 11/04/2021 1117 by Jerral Bonito, RN Outcome: Progressing 11/04/2021 1117 by Jerral Bonito, RN Outcome: Progressing Goal: Ability to verbalize understanding of medication therapies will improve 11/04/2021 1117 by Jerral Bonito, RN Outcome: Progressing 11/04/2021 1117 by Jerral Bonito, RN Outcome: Progressing Goal: Individualized Educational Video(s) 11/04/2021 1117 by Jerral Bonito, RN Outcome: Progressing 11/04/2021 1117 by Jerral Bonito, RN Outcome: Progressing   Problem: Activity: Goal: Capacity to carry out activities will improve 11/04/2021 1117 by Jerral Bonito, RN Outcome: Progressing 11/04/2021 1117 by Jerral Bonito, RN Outcome: Progressing   Problem: Cardiac: Goal: Ability to  achieve and maintain adequate cardiopulmonary perfusion will improve 11/04/2021 1117 by Jerral Bonito, RN Outcome: Progressing 11/04/2021 1117 by Jerral Bonito, RN Outcome: Progressing

## 2021-11-04 NOTE — Anesthesia Postprocedure Evaluation (Signed)
Anesthesia Post Note  Patient: Deborah Jordan  Procedure(s) Performed: TRANSESOPHAGEAL ECHOCARDIOGRAM (TEE)     Patient location during evaluation: PACU Anesthesia Type: MAC Level of consciousness: awake and alert Pain management: pain level controlled Vital Signs Assessment: post-procedure vital signs reviewed and stable Respiratory status: spontaneous breathing Cardiovascular status: stable Anesthetic complications: no   No notable events documented.  Last Vitals:  Vitals:   11/04/21 1310 11/04/21 1320  BP: (!) 175/41 (!) 182/42  Pulse: 60 (!) 58  Resp: 16 19  Temp:    SpO2: 96% 94%    Last Pain:  Vitals:   11/04/21 1310  TempSrc:   PainSc: 0-No pain                 Nolon Nations

## 2021-11-04 NOTE — Anesthesia Preprocedure Evaluation (Addendum)
Anesthesia Evaluation  Patient identified by MRN, date of birth, ID band Patient awake    Reviewed: Allergy & Precautions, H&P , NPO status , Patient's Chart, lab work & pertinent test results, reviewed documented beta blocker date and time   Airway Mallampati: III  TM Distance: >3 FB Neck ROM: Full    Dental  (+) Missing, Poor Dentition, Dental Advisory Given,    Pulmonary shortness of breath,    Pulmonary exam normal breath sounds clear to auscultation       Cardiovascular hypertension, Pt. on medications + CAD and +CHF  + dysrhythmias Atrial Fibrillation  Rhythm:Regular Rate:Normal + Systolic murmurs Echo 76/19/5093 1. Left ventricular ejection fraction, by estimation, is 65 to 70%. The left ventricle has normal function. Left ventricular endocardial border not optimally defined to evaluate regional wall motion. There is mild concentric left ventricular hypertrophy. Left ventricular diastolic parameters are indeterminate.  2. Right ventricular systolic function is normal. The right ventricular size is normal.  3. Left atrial size was severely dilated.  4. The mitral valve is myxomatous. Mild mitral valve regurgitation. There is moderate late systolic prolapse of both leaflets of the mitral valve.  5. Elevated velocity due to hyperdynamic LVEF. The aortic valve is normal in structure. Aortic valve regurgitation is not visualized. No aortic  stenosis is present.  6. The inferior vena cava is dilated in size with <50% respiratory variability, suggesting right atrial pressure of 15 mmHg.    Neuro/Psych PSYCHIATRIC DISORDERS Anxiety Depression  Neuromuscular disease    GI/Hepatic Neg liver ROS, GERD  Medicated and Controlled,  Endo/Other  negative endocrine ROS  Renal/GU Renal disease     Musculoskeletal  (+) Arthritis ,   Abdominal (+) + obese,   Peds  Hematology  (+) Blood dyscrasia, anemia ,   Anesthesia Other  Findings   Reproductive/Obstetrics                            Anesthesia Physical  Anesthesia Plan  ASA: 3  Anesthesia Plan: MAC   Post-op Pain Management:    Induction: Intravenous  PONV Risk Score and Plan: 3 and Propofol infusion, TIVA, Treatment may vary due to age or medical condition and Ondansetron  Airway Management Planned: Natural Airway and Mask  Additional Equipment: None  Intra-op Plan:   Post-operative Plan:   Informed Consent: I have reviewed the patients History and Physical, chart, labs and discussed the procedure including the risks, benefits and alternatives for the proposed anesthesia with the patient or authorized representative who has indicated his/her understanding and acceptance.     Dental advisory given  Plan Discussed with: CRNA  Anesthesia Plan Comments:       Anesthesia Quick Evaluation

## 2021-11-04 NOTE — Progress Notes (Signed)
D/W Dr. Lars Mage. Pacemaker tomorrow afternoon. Keep NPO after 5 am clear liquid diet   Adrian Prows, MD, [facility] 11/04/2021, 5:44 PM Office: [phone] Fax: [phone] Pager: [phone]

## 2021-11-04 NOTE — Progress Notes (Addendum)
Advanced Heart Failure Rounding Note  PCP-Cardiologist: Dr. Einar Gip    Subjective:    Milrinone stopped yesterday. Co-ox 64% today.   Now off diuretics and getting IVFs back.  AKI improving, SCr down 2.10>>1.90. UOP yesterday 850 cc. 300 in foley bag, urine clear   BNP 597>>512  Getting TEE today to assess MR severity. Blood Cx, NGTD   Feels ok today. Sitting up in bed. Remains on 4L [place]. Comfortable w/ current support.   RHC 11/15 RA: 21/20, mean 16 mmHg. RV 46/16, EDP 22 mmHg. PA 61/18, mean 35 mmHg.  PA saturation 72%. PW 43/22, mean 23 mmHg.  Giant V waves noted suggestive of severe MR. CO 7.49, CI 4.33 by Fick.  QP/QS 1.00. TPVR 8.08 Wood units.  PVR 2.77 Wood units. SVR 900 dynes.  2D Echo 11/15 EF 60-65% Mild MR. RV ok   Objective:   Weight Range: 81.2 kg Body mass index is 38.74 kg/m.   Vital Signs:   Temp:  [97.8 F (36.6 C)-98.6 F (37 C)] 97.8 F (36.6 C) (11/17 0633) Pulse Rate:  [31-71] 38 (11/17 0700) Resp:  [17-29] 17 (11/17 0700) BP: (110-156)/(34-123) 149/60 (11/17 0700) SpO2:  [86 %-100 %] 99 % (11/17 0700) FiO2 (%):  [40 %] 40 % (11/16 2202) Weight:  [81.2 kg] 81.2 kg (11/17 0500) Last BM Date: 11/02/21  Weight change: Filed Weights   11/01/21 0332 11/02/21 0513 11/04/21 0500  Weight: 83.3 kg 82.6 kg 81.2 kg    Intake/Output:   Intake/Output Summary (Last 24 hours) at 11/04/2021 0907 Last data filed at 11/04/2021 0620 Gross per 24 hour  Intake 141.05 ml  Output 850 ml  Net -708.95 ml      Physical Exam     General:  Well appearing elderly F, sitting up in bed. No respiratory difficulty HEENT: normal Neck: supple. Rt IJ TVPW, bandaged. JV not well visualized on the left. Carotids 2+ bilat; no bruits. No lymphadenopathy or thyromegaly appreciated. Cor: PMI nondisplaced. Regular rate & rhythm. No rubs, gallops or murmurs. Lungs: clear Abdomen: soft, nontender, nondistended. No hepatosplenomegaly. No bruits or masses. Good  bowel sounds. Extremities: no cyanosis, clubbing, rash, edema Neuro: alert & oriented x 3, cranial nerves grossly intact. moves all 4 extremities w/o difficulty. Affect pleasant.  Telemetry   V paced 60s   EKG    No new EKG to review   Labs    CBC Recent Labs    11/02/21 0338 11/02/21 0931 11/03/21 0443 11/03/21 1657  WBC 11.1*  --  8.3  --   NEUTROABS 8.7*  --   --   --   HGB 9.4*   < > 8.1* 8.7*  HCT 26.9*   < > 23.7* 25.0*  MCV 84.1  --  84.6  --   PLT 270  --  279  --    < > = values in this interval not displayed.   Basic Metabolic Panel Recent Labs    11/02/21 0338 11/02/21 0931 11/03/21 1657 11/04/21 0636  NA 127*   < > 127* 128*  K 4.6   < > 3.9 4.5  CL 96*   < > 95* 98  CO2 19*   < > 21* 22  GLUCOSE 82   < > 117* 102*  BUN 24*   < > 30* 29*  CREATININE 1.84*   < > 2.10* 1.90*  CALCIUM 8.8*   < > 8.6* 8.8*  MG 2.1  --   --   --    < > =  values in this interval not displayed.   Liver Function Tests No results for input(s): AST, ALT, ALKPHOS, BILITOT, PROT, ALBUMIN in the last 72 hours.  No results for input(s): LIPASE, AMYLASE in the last 72 hours. Cardiac Enzymes No results for input(s): CKTOTAL, CKMB, CKMBINDEX, TROPONINI in the last 72 hours.  BNP: BNP (last 3 results) Recent Labs    10/31/21 0919 11/03/21 1657 11/04/21 0636  BNP 525.0* 597.9* 512.9*    ProBNP (last 3 results) No results for input(s): PROBNP in the last 8760 hours.   D-Dimer No results for input(s): DDIMER in the last 72 hours. Hemoglobin A1C No results for input(s): HGBA1C in the last 72 hours. Fasting Lipid Panel No results for input(s): CHOL, HDL, LDLCALC, TRIG, CHOLHDL, LDLDIRECT in the last 72 hours. Thyroid Function Tests No results for input(s): TSH, T4TOTAL, T3FREE, THYROIDAB in the last 72 hours.  Invalid input(s): FREET3   Other results:   Imaging    DG CHEST PORT 1 VIEW  Result Date: 11/03/2021 CLINICAL DATA:  Chest pain. EXAM: PORTABLE  CHEST 1 VIEW COMPARISON:  October 31, 2021 FINDINGS: A right internal jugular pacer wire is in place. Mild atelectasis is seen within the retrocardiac region of the left lung base. There is no evidence of a pleural effusion or pneumothorax. The cardiac silhouette is enlarged and unchanged in size. Degenerative changes seen throughout the thoracic spine. IMPRESSION: Stable cardiomegaly with mild left basilar atelectasis. Electronically Signed   By: Virgina Norfolk M.D.   On: 11/03/2021 17:02     Medications:     Scheduled Medications:  aspirin EC  81 mg Oral Daily   Chlorhexidine Gluconate Cloth  6 each Topical Daily   DULoxetine  20 mg Oral Daily   enoxaparin (LOVENOX) injection  30 mg Subcutaneous Q24H   gabapentin  100 mg Oral QHS   magnesium oxide  400 mg Oral BID   pantoprazole  40 mg Oral Daily   potassium chloride  40 mEq Oral BID   sodium chloride flush  3 mL Intravenous Q12H   sodium chloride flush  3 mL Intravenous Q12H   sodium chloride flush  3 mL Intravenous Q12H   sodium chloride  1 g Oral BID WC   tamsulosin  0.4 mg Oral QPC supper   trimethoprim  100 mg Oral Daily    Infusions:  sodium chloride Stopped (11/03/21 2049)   sodium chloride Stopped (11/03/21 2052)   sodium chloride     sodium chloride 20 mL/hr at 11/03/21 2050   sodium chloride      PRN Medications: sodium chloride, sodium chloride, sodium chloride, acetaminophen, albuterol, LORazepam, melatonin, ondansetron (ZOFRAN) IV, sodium chloride flush, sodium chloride flush, sodium chloride flush  Assessment/Plan   Mitral Regurgitation  - TTE  done 10/22 for bacteremia showed moderate holosystolic prolapse of the middle scallop of the posterior leaflet of the mitral valve w/ mild regurgitation. Did not get TEE - Now w/ admitted w/ a/c CHF/ pulmonary edema w/ RHC suggestive of severe MR (large V waves). However MR appears only mild on repeat TTE - ? If sequela from recent bacteremia. Infective endocarditis  was never excluded. Needs definitive TEE to better define the valve. Dr. Einar Gip to perform today  - now off milrinone  - now off abx. Repeat blood cultures NGTD  - cannot exclude ischemic MR. Conduction dz/bradycardia, know CAD and PAD. No LHC yet w/ AKI  - If TEE confirms severe MR, will need to insure that bacteremia has cleared prior  to consideration for potential MitraClip    2. Acute on Chronic Diastolic Heart Failure - Echo 04/2017 EF 55-60%, G1DD, No MR - Echo 10/22 EF 55-60% - RHC  mPCWP 23, RA 16. Normal CO  - now off diuretics w/ AKI, getting IVFs back - off milrinone, co-ox 64%  - off ? blocker w/ bradycardia    3. AKI on IIIa CKD  - Baseline SCr 0.6 - 1.66 on admit, 1.84>>1.89> 2.10>1.90  - suspect cardiorenal from MR  - monitor BMP    4. CHB - now off ? blocker - supported w/ TVPW  - EP has seen, will need negative Blood cx prior to possible PPM implant    5. Hypervolemic Hyponatremia - Na 127>129>128  - fluid restrict     Length of Stay: [place], PA-C  11/04/2021, 9:07 AM  Advanced Heart Failure Team Pager [phone] (M-F; 7a - 5p)  Please contact [facility] for night-coverage after hours (5p -7a ) and weekends on [email or web address]  Patient seen and examined with the above-signed Advanced Practice Provider and/or Housestaff. I personally reviewed laboratory data, imaging studies and relevant notes. I independently examined the patient and formulated the important aspects of the plan. I have edited the note to reflect any of my changes or salient points. I have personally discussed the plan with the patient and/or family.  Feeling better this am. Milrinone off. Off bipap Denies SOB, orthopnea or PND. Remains in CHB with rates in 50-60s  General:  Elderly woman. Lying in bed No resp difficulty HEENT: normal Neck: supple. no JVD. Carotids 2+ bilat; no bruits. No lymphadenopathy or thryomegaly appreciated. Cor: PMI nondisplaced. Irregular rate & rhythm.  No rubs, gallops or murmurs. Lungs: clear Abdomen: soft, nontender, nondistended. No hepatosplenomegaly. No bruits or masses. Good bowel sounds. Extremities: no cyanosis, clubbing, rash, edema Neuro: alert & orientedx3, cranial nerves grossly intact. moves all 4 extremities w/o difficulty. Affect pleasant  Much improved. Continues with CHB. Plan TEE to better assess for MR and possible vegetation. If no vegetations and bcx negative will likely need PPM. EP following. D/w Dr. Einar Gip.   Glori Bickers, MD  11:52 AM

## 2021-11-04 NOTE — CV Procedure (Signed)
TEE: Under deep sedation, TEE was performed without complications: LV: Normal. Normal EF. RV: Normal, pacemaker wires noted. LA: Normal. Left atrial appendage: Not visualized due to patient's precarious condition and quick probe in and out.  Inter atrial septum is intact without defect.  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  No definite vegetations.  Mild prolapse of the posterior mitral leaflet >anterior mitral leaflet with mild mitral regurgitation. TV: Normal Trace TR AV: Normal. No AI or AS. PV: Normal. Trace PI.  Thoracic and ascending aorta: Normal without significant plaque or atheromatous changes.   Adrian Prows, MD, [facility] 11/04/2021, 12:58 PM Office: [phone] Fax: [phone] Pager: [phone]

## 2021-11-05 ENCOUNTER — Inpatient Hospital Stay (HOSPITAL_COMMUNITY)
Admission: EM | Disposition: A | Discharge status: Skilled Nursing Facility | Payer: Self-pay | Source: Home / Self Care | Attending: Cardiology

## 2021-11-05 DIAGNOSIS — I441 Atrioventricular block, second degree: Secondary | ICD-10-CM

## 2021-11-05 HISTORY — PX: PACEMAKER IMPLANT: EP1218

## 2021-11-05 LAB — BASIC METABOLIC PANEL
Anion gap: 8 (ref 5–15)
BUN: 18 mg/dL (ref 8–23)
CO2: 26 mmol/L (ref 22–32)
Calcium: 8.9 mg/dL (ref 8.9–10.3)
Chloride: 96 mmol/L — ABNORMAL LOW (ref 98–111)
Creatinine, Ser: 1.28 mg/dL — ABNORMAL HIGH (ref 0.44–1.00)
GFR, Estimated: 41 mL/min — ABNORMAL LOW (ref >60)
Glucose, Bld: 96 mg/dL (ref 70–99)
Potassium: 4.5 mmol/L (ref 3.5–5.1)
Sodium: 130 mmol/L — ABNORMAL LOW (ref 135–145)

## 2021-11-05 LAB — RESP PANEL BY RT-PCR (FLU A&B, COVID) ARPGX2
Influenza A by PCR: NEGATIVE
Influenza B by PCR: NEGATIVE
SARS Coronavirus 2 by RT PCR: NEGATIVE

## 2021-11-05 LAB — COOXEMETRY PANEL
Carboxyhemoglobin: 1 % (ref 0.5–1.5)
Methemoglobin: 0.8 % (ref 0.0–1.5)
O2 Saturation: 75.7 %
Total hemoglobin: 9.2 g/dL — ABNORMAL LOW (ref 12.0–16.0)

## 2021-11-05 SURGERY — PACEMAKER IMPLANT

## 2021-11-05 MED ORDER — SODIUM CHLORIDE 0.9 % IV SOLN: INTRAVENOUS | Status: DC

## 2021-11-05 MED ORDER — CEFAZOLIN SODIUM-DEXTROSE 1-4 GM/50ML-% IV SOLN
1.0000 g | Freq: Three times a day (TID) | INTRAVENOUS | Status: AC
  Administered 2021-11-05 – 2021-11-06 (×2): 1 g via INTRAVENOUS
  Filled 2021-11-05 (×3): qty 50

## 2021-11-05 MED ORDER — LIDOCAINE HCL (PF) 1 % IJ SOLN
INTRAMUSCULAR | Status: DC | PRN
  Administered 2021-11-05: 60 mL

## 2021-11-05 MED ORDER — SODIUM CHLORIDE 0.9 % IV SOLN
INTRAVENOUS | Status: AC
  Filled 2021-11-05: qty 2

## 2021-11-05 MED ORDER — LIDOCAINE HCL 1 % IJ SOLN
INTRAMUSCULAR | Status: AC
  Filled 2021-11-05: qty 40

## 2021-11-05 MED ORDER — POTASSIUM CHLORIDE CRYS ER 20 MEQ PO TBCR
40.0000 meq | EXTENDED_RELEASE_TABLET | Freq: Every day | ORAL | Status: DC
  Administered 2021-11-06 – 2021-11-07 (×2): 40 meq via ORAL
  Filled 2021-11-05 (×2): qty 2

## 2021-11-05 MED ORDER — CHLORHEXIDINE GLUCONATE 4 % EX LIQD
60.0000 mL | Freq: Once | CUTANEOUS | Status: AC
  Administered 2021-11-05: 4 via TOPICAL
  Filled 2021-11-05: qty 15

## 2021-11-05 MED ORDER — FUROSEMIDE 40 MG PO TABS
40.0000 mg | ORAL_TABLET | Freq: Every day | ORAL | Status: DC
  Administered 2021-11-06 – 2021-11-09 (×4): 40 mg via ORAL
  Filled 2021-11-05 (×4): qty 1

## 2021-11-05 MED ORDER — CEFAZOLIN SODIUM-DEXTROSE 1-4 GM/50ML-% IV SOLN: 1.0000 g | Freq: Four times a day (QID) | INTRAVENOUS | Status: DC

## 2021-11-05 MED ORDER — CEFAZOLIN SODIUM-DEXTROSE 2-4 GM/100ML-% IV SOLN
INTRAVENOUS | Status: AC
  Filled 2021-11-05: qty 100

## 2021-11-05 MED ORDER — CEFAZOLIN SODIUM-DEXTROSE 2-4 GM/100ML-% IV SOLN
2.0000 g | INTRAVENOUS | Status: AC
  Administered 2021-11-05: 2 g via INTRAVENOUS
  Filled 2021-11-05: qty 100

## 2021-11-05 MED ORDER — HEPARIN (PORCINE) IN NACL 1000-0.9 UT/500ML-% IV SOLN
INTRAVENOUS | Status: AC
  Filled 2021-11-05: qty 500

## 2021-11-05 MED ORDER — SODIUM CHLORIDE 0.9 % IV SOLN
80.0000 mg | INTRAVENOUS | Status: AC
  Administered 2021-11-05: 80 mg
  Filled 2021-11-05: qty 2

## 2021-11-05 MED ORDER — LIDOCAINE HCL 1 % IJ SOLN
INTRAMUSCULAR | Status: AC
  Filled 2021-11-05: qty 20

## 2021-11-05 MED ORDER — HEPARIN (PORCINE) IN NACL 1000-0.9 UT/500ML-% IV SOLN
INTRAVENOUS | Status: DC | PRN
  Administered 2021-11-05: 500 mL

## 2021-11-05 MED ORDER — CHLORHEXIDINE GLUCONATE 4 % EX LIQD
60.0000 mL | Freq: Once | CUTANEOUS | Status: AC
  Filled 2021-11-05: qty 45

## 2021-11-05 SURGICAL SUPPLY — 9 items
CABLE SURGICAL S-101-97-12 (CABLE) ×2 IMPLANT
LEAD TENDRIL MRI 46CM LPA1200M (Lead) ×2 IMPLANT
LEAD TENDRIL MRI 52CM LPA1200M (Lead) ×2 IMPLANT
MAT PREVALON FULL STRYKER (MISCELLANEOUS) ×2 IMPLANT
PACEMAKER ASSURITY DR-RF (Pacemaker) ×2 IMPLANT
PAD DEFIB RADIO PHYSIO CONN (PAD) ×2 IMPLANT
SHEATH 8FR PRELUDE SNAP 13 (SHEATH) ×4 IMPLANT
SHEATH PROBE COVER 6X72 (BAG) ×2 IMPLANT
TRAY PACEMAKER INSERTION (PACKS) ×2 IMPLANT

## 2021-11-05 NOTE — Plan of Care (Signed)
  Problem: Education: Goal: Knowledge of General Education information will improve Description: Including pain rating scale, medication(s)/side effects and non-pharmacologic comfort measures Outcome: Progressing   Problem: Clinical Measurements: Goal: Ability to maintain clinical measurements within normal limits will improve Outcome: Progressing Goal: Will remain free from infection Outcome: Progressing Goal: Diagnostic test results will improve Outcome: Progressing Goal: Respiratory complications will improve Outcome: Progressing   Problem: Activity: Goal: Risk for activity intolerance will decrease Outcome: Progressing   Problem: Nutrition: Goal: Adequate nutrition will be maintained Outcome: Progressing

## 2021-11-05 NOTE — Progress Notes (Addendum)
Electrophysiology Rounding Note  Patient Name: HADDY MULLINAX Date of Encounter: 11/05/2021  Primary Cardiologist: Einar Gip Electrophysiologist: New to Dr. Curt Bears   Subjective   Alert and active today.  Inpatient Medications    Scheduled Meds:  aspirin EC  81 mg Oral Daily   Chlorhexidine Gluconate Cloth  6 each Topical Daily   DULoxetine  20 mg Oral Daily   enoxaparin (LOVENOX) injection  30 mg Subcutaneous Q24H   furosemide  40 mg Intravenous Daily   gabapentin  100 mg Oral QHS   magnesium oxide  400 mg Oral BID   mouth rinse  15 mL Mouth Rinse BID   mupirocin ointment  1 application Nasal BID   pantoprazole  40 mg Oral Daily   potassium chloride  40 mEq Oral BID   sodium chloride flush  3 mL Intravenous Q12H   sodium chloride flush  3 mL Intravenous Q12H   sodium chloride flush  3 mL Intravenous Q12H   sodium chloride  1 g Oral BID WC   tamsulosin  0.4 mg Oral QPC supper   trimethoprim  100 mg Oral Daily   Continuous Infusions:  sodium chloride     sodium chloride     PRN Meds: sodium chloride, sodium chloride, acetaminophen, albuterol, LORazepam, melatonin, ondansetron (ZOFRAN) IV, sodium chloride flush, sodium chloride flush   Vital Signs    Vitals:   11/05/21 0425 11/05/21 0500 11/05/21 0600 11/05/21 0700  BP:  (!) 158/66 (!) 174/59 (!) 156/57  Pulse:  (!) 43 60 (!) 59  Resp:  (!) 22 14 20   Temp:      TempSrc:      SpO2:  97% 98% 96%  Weight: 78.8 kg     Height:        Intake/Output Summary (Last 24 hours) at 11/05/2021 0725 Last data filed at 11/05/2021 0717 Gross per 24 hour  Intake 1093.44 ml  Output 4625 ml  Net -3531.56 ml   Filed Weights   11/02/21 0513 11/04/21 0500 11/05/21 0425  Weight: 82.6 kg 81.2 kg 78.8 kg    Physical Exam    GEN- The patient is well appearing, alert and oriented x 3 today.   Head- normocephalic, atraumatic Eyes-  Sclera clear, conjunctiva pink Ears- hearing intact Oropharynx- clear Neck- supple Lungs-  Clear to ausculation bilaterally, normal work of breathing Heart-  Slow and somewhat irregular.    GI- soft, NT, ND, + BS Extremities- no clubbing or cyanosis. No edema Skin- no rash or lesion Psych- euthymic mood, full affect Neuro- strength and sensation are intact  Labs    CBC Recent Labs    11/03/21 0443 11/03/21 1657 11/04/21 1931  WBC 8.3  --   --   HGB 8.1* 8.7* 8.8*  HCT 23.7* 25.0* 26.4*  MCV 84.6  --   --   PLT 279  --   --    Basic Metabolic Panel Recent Labs    11/04/21 0636 11/05/21 0418  NA 128* 130*  K 4.5 4.5  CL 98 96*  CO2 22 26  GLUCOSE 102* 96  BUN 29* 18  CREATININE 1.90* 1.28*  CALCIUM 8.8* 8.9   Liver Function Tests No results for input(s): AST, ALT, ALKPHOS, BILITOT, PROT, ALBUMIN in the last 72 hours. No results for input(s): LIPASE, AMYLASE in the last 72 hours. Cardiac Enzymes No results for input(s): CKTOTAL, CKMB, CKMBINDEX, TROPONINI in the last 72 hours.   Telemetry    V pacing in 50s, with intermittent  LOC. Underlying 2:1 AV block (personally reviewed)  Radiology    DG CHEST PORT 1 VIEW  Result Date: 11/03/2021 CLINICAL DATA:  Chest pain. EXAM: PORTABLE CHEST 1 VIEW COMPARISON:  October 31, 2021 FINDINGS: A right internal jugular pacer wire is in place. Mild atelectasis is seen within the retrocardiac region of the left lung base. There is no evidence of a pleural effusion or pneumothorax. The cardiac silhouette is enlarged and unchanged in size. Degenerative changes seen throughout the thoracic spine. IMPRESSION: Stable cardiomegaly with mild left basilar atelectasis. Electronically Signed   By: Virgina Norfolk M.D.   On: 11/03/2021 17:02   ECHO TEE  Result Date: 11/04/2021    TRANSESOPHOGEAL ECHO REPORT   Patient Name:   RONNAE KASER [facility] Date of Exam: 11/04/2021 Medical Rec #:  [id number]     Height:       57.0 in Accession #:    [id number]    Weight:       179.0 lb Date of Birth:  [birth year]     BSA:          1.716 m Patient  Age:    87 years      BP:           149/60 mmHg Patient Gender: F             HR:           64 bpm. Exam Location:  Inpatient Procedure: Transesophageal Echo and Color Doppler Indications:     Mitral Regurgitation  History:         Patient has prior history of Echocardiogram examinations, most                  recent 11/02/2021. Cardiomegaly, CAD, Arrythmias:RBBB; Risk                  Factors:Hypertension.  Sonographer:     Bernadene Person RDCS Referring Phys:  Francis Diagnosing Phys: Adrian Prows MD PROCEDURE: After discussion of the risks and benefits of a TEE, an informed consent was obtained from the patient. The transesophogeal probe was passed without difficulty through the esophogus of the patient. Local oropharyngeal anesthetic was provided with Cetacaine. Sedation performed by different physician. The patient was monitored while under deep sedation. Anesthestetic sedation was provided intravenously by Anesthesiology: 60.9mg  of Propofol. The patient's vital signs; including heart rate, blood pressure, and oxygen saturation; remained stable throughout the procedure. The patient developed no complications during the procedure. IMPRESSIONS  1. Left ventricular ejection fraction, by estimation, is 60 to 65%. The left ventricle has normal function. The left ventricle has no regional wall motion abnormalities.  2. Right ventricular systolic function is normal. The right ventricular size is normal.  3. Left atrial size was moderately dilated.  4. Pacer lead noted.  5. The mitral valve is myxomatous. Mild mitral valve regurgitation.  6. The aortic valve is normal in structure. Aortic valve regurgitation is trivial. No aortic stenosis is present. Conclusion(s)/Recommendation(s): Quick study done to decrease respiratory compromise. Microvegetations cannot be excluded in a degenerated myxomatous valve. Repeat TEE in 4 weeks if clinically indicated. FINDINGS  Left Ventricle: Left ventricular ejection fraction, by  estimation, is 60 to 65%. The left ventricle has normal function. The left ventricle has no regional wall motion abnormalities. The left ventricular internal cavity size was normal in size. There is  no left ventricular hypertrophy. Right Ventricle: The right ventricular size is normal. No increase in right ventricular wall thickness.  Right ventricular systolic function is normal. Left Atrium: Left atrial size was moderately dilated. Right Atrium: Right atrial size was normal in size. Pacer lead noted. Pericardium: There is no evidence of pericardial effusion. Mitral Valve: The mitral valve is myxomatous. Mild mitral valve regurgitation. Tricuspid Valve: The tricuspid valve is normal in structure. Tricuspid valve regurgitation is trivial. No evidence of tricuspid stenosis. Aortic Valve: The aortic valve is normal in structure. Aortic valve regurgitation is trivial. No aortic stenosis is present. Pulmonic Valve: The pulmonic valve was normal in structure. Pulmonic valve regurgitation is not visualized. No evidence of pulmonic stenosis. Aorta: The aortic root is normal in size and structure. IAS/Shunts: No atrial level shunt detected by color flow Doppler. Adrian Prows MD Electronically signed by Adrian Prows MD Signature Date/Time: 11/04/2021/6:14:14 PM    Final     Patient Profile     CLYTIE SHETLEY is a 87 y.o. female with a history of HTN, CKD, HLD, bifascicular block, and hyponatremia who is being seen today for the evaluation of bradycardia and heart failure at the request of Dr. Telford Nab.  Assessment & Plan    Complete heart block, advanced AV block Temp pacer with intermittent LOC. Discussed with Dr. Telford Nab, no plans to revise with stable escape on PPM today.  With further work up it is felt her CHB drove her CHF.  RHC and TTE showed discordant MR.  TEE showed only mild to mod myxomatous degeneration.  Explained risks, benefits, and alternatives to PPM implantation, including but not limited to  bleeding, infection, pneumothorax, pericardial effusion, lead dislodgement, heart attack, stroke, or death.  Pt verbalized understanding and agrees to proceed if indicated.     2  Acute respiratory failure Much improved.   Plan for pacer this afternoon.   For questions or updates, please contact [place] Please consult [email or web address] for contact info under Cardiology/STEMI.  Signed, Shirley Friar, PA-C  11/05/2021, 7:25 AM   I have seen and examined this patient with Oda Kilts.  Agree with above, note added to reflect my findings.  On exam, irregular rhythm, no murmurs, lungs clear.  Patient with continued heart block.  She has intermittent pacing from her temporary wire.  Unfortunately heart block continues and thus she would benefit from pacemaker implant.  [name] has presented today for surgery, with the diagnosis of heart block.  The various methods of treatment have been discussed with the patient and family. After consideration of risks, benefits and other options for treatment, the patient has consented to  Procedure(s): Pacemaker implant as a surgical intervention .  Risks include but not limited to bleeding, infection, pneumothorax, perforation, tamponade, vascular damage, renal failure, MI, stroke, death, and lead dislodgement . The patient's history has been reviewed, patient examined, no change in status, stable for surgery.  I have reviewed the patient's chart and labs.  Questions were answered to the patient's satisfaction.     Wright Gravely M. Terence Googe MD 11/05/2021 12:38 PM

## 2021-11-05 NOTE — Discharge Instructions (Signed)
After Your Pacemaker   ACTIVITY Do not lift your arm above shoulder height for 1 week after your procedure. After 7 days, you may progress as below.  You should remove your sling 24 hours after your procedure, unless otherwise instructed by your provider.     Friday November 12, 2021  Saturday November 13, 2021 Sunday November 14, 2021 Monday November 15, 2021   Do not lift, push, pull, or carry anything over 10 pounds with the affected arm until 6 weeks (Friday December 17, 2021 ) after your procedure.   You may drive AFTER your wound check, unless you have been told otherwise by your provider.   Ask your healthcare provider when you can go back to work   INCISION/Dressing If you are on a blood thinner such as Coumadin, Xarelto, Eliquis, Plavix, or Pradaxa please confirm with your provider when this should be resumed.   If large square, outer bandage is left in place, this can be removed after 24 hours from your procedure. Do not remove steri-strips or glue as below.   Monitor your Pacemaker site for redness, swelling, and drainage. Call the device clinic at [phone] if you experience these symptoms or fever/chills.  If your incision is sealed with Steri-strips or staples, you may shower 10 days after your procedure or when told by your provider. Do not remove the steri-strips or let the shower hit directly on your site. You may wash around your site with soap and water.    If you were discharged in a sling, please do not wear this during the day more than 48 hours after your surgery unless otherwise instructed. This may increase the risk of stiffness and soreness in your shoulder.   Avoid lotions, ointments, or perfumes over your incision until it is well-healed.  You may use a hot tub or a pool AFTER your wound check appointment if the incision is completely closed.  PAcemaker Alerts:  Some alerts are vibratory and others beep. These are NOT emergencies. Please call our office  to let us know. If this occurs at night or on weekends, it can wait until the next business day. Send a remote transmission.  If your device is capable of reading fluid status (for heart failure), you will be offered monthly monitoring to review this with you.   DEVICE MANAGEMENT Remote monitoring is used to monitor your pacemaker from home. This monitoring is scheduled every 91 days by our office. It allows [place] to keep an eye on the functioning of your device to ensure it is working properly. You will routinely see your Electrophysiologist annually (more often if necessary).   You should receive your ID card for your new device in 4-8 weeks. Keep this card with you at all times once received. Consider wearing a medical alert bracelet or necklace.  Your Pacemaker may be MRI compatible. This will be discussed at your next office visit/wound check.  You should avoid contact with strong electric or magnetic fields.   Do not use amateur (ham) radio equipment or electric (arc) welding torches. MP3 player headphones with magnets should not be used. Some devices are safe to use if held at least 12 inches (30 cm) from your Pacemaker. These include power tools, lawn mowers, and speakers. If you are unsure if something is safe to use, ask your health care provider.  When using your cell phone, hold it to the ear that is on the opposite side from the Pacemaker. Do not leave your cell  phone in a pocket over the Pacemaker.  You may safely use electric blankets, heating pads, computers, and microwave ovens.  Call the office right away if: You have chest pain. You feel more short of breath than you have felt before. You feel more light-headed than you have felt before. Your incision starts to open up.  This information is not intended to replace advice given to you by your health care provider. Make sure you discuss any questions you have with your health care provider.

## 2021-11-05 NOTE — Progress Notes (Addendum)
Subjective:  Breathing improved  Intermittent loss of capture on temp pacer Plan for PPM today  Objective:  Vital Signs in the last 24 hours: Temp:  [97.8 F (36.6 C)-98 F (36.7 C)] 98 F (36.7 C) (11/18 1137) Pulse Rate:  [38-83] 59 (11/18 0700) Resp:  [12-33] 20 (11/18 0700) BP: (99-183)/(42-90) 156/57 (11/18 0700) SpO2:  [93 %-98 %] 96 % (11/18 0700) Weight:  [78.8 kg] 78.8 kg (11/18 0425)  Intake/Output from previous day: 11/17 0701 - 11/18 0700 In: 1093.4 [P.O.:360; I.V.:733.4] Out: 4275 [Urine:4275]  Physical Exam Vitals and nursing note reviewed.  Constitutional:      General: She is not in acute distress.    Appearance: She is well-developed.  HENT:     Head: Normocephalic and atraumatic.  Eyes:     Conjunctiva/sclera: Conjunctivae normal.     Pupils: Pupils are equal, round, and reactive to light.  Neck:     Vascular: No JVD.  Cardiovascular:     Rate and Rhythm: Normal rate and regular rhythm.     Pulses: Normal pulses and intact distal pulses.     Heart sounds: Murmur heard.  High-pitched blowing holosystolic murmur is present with a grade of 2/6 at the apex.  Pulmonary:     Effort: No tachypnea or respiratory distress.     Breath sounds: No decreased breath sounds, wheezing or rales.  Abdominal:     General: Bowel sounds are normal.     Palpations: Abdomen is soft.     Tenderness: There is no rebound.  Musculoskeletal:        General: No tenderness. Normal range of motion.     Right lower leg: No edema.     Left lower leg: No edema.  Lymphadenopathy:     Cervical: No cervical adenopathy.  Skin:    General: Skin is warm and dry.  Neurological:     Mental Status: She is alert and oriented to person, place, and time.     Cranial Nerves: No cranial nerve deficit.     Lab Results: BMP Recent Labs    11/03/21 1657 11/04/21 0636 11/05/21 0418  NA 127* 128* 130*  K 3.9 4.5 4.5  CL 95* 98 96*  CO2 21* 22 26  GLUCOSE 117* 102* 96  BUN 30*  29* 18  CREATININE 2.10* 1.90* 1.28*  CALCIUM 8.6* 8.8* 8.9  GFRNONAA 23* 25* 41*     CBC Recent Labs  Lab 11/02/21 0338 11/02/21 0931 11/03/21 0443 11/03/21 1657 11/04/21 1931  WBC 11.1*  --  8.3  --   --   RBC 3.20*  --  2.80*  --   --   HGB 9.4*   < > 8.1*   < > 8.8*  HCT 26.9*   < > 23.7*   < > 26.4*  PLT 270  --  279  --   --   MCV 84.1  --  84.6  --   --   MCH 29.4  --  28.9  --   --   MCHC 34.9  --  34.2  --   --   RDW 14.3  --  14.2  --   --   LYMPHSABS 1.2  --   --   --   --   MONOABS 1.1*  --   --   --   --   EOSABS 0.0  --   --   --   --   BASOSABS 0.0  --   --   --   --    < > =  values in this interval not displayed.     Cardiac Panel (last 3 results) Results for BRIGIT, DOKE (MRN [id number]) as of 11/01/2021 09:56  Ref. Range 09/22/2021 08:11 09/22/2021 12:03 10/31/2021 09:19 10/31/2021 11:07  B Natriuretic Peptide Latest Ref Range: 0.0 - 100.0 pg/mL   525.0 (H)   Troponin I (High Sensitivity) Latest Ref Range: <18 ng/L 24 (H) 24 (H) 21 (H) 20 (H)    BNP (last 3 results) Recent Labs    10/31/21 0919 11/03/21 1657 11/04/21 0636  BNP 525.0* 597.9* 512.9*     TSH Recent Labs    06/19/21 0319 10/31/21 0919  TSH 1.872 1.355     Hepatic Function Panel Recent Labs    09/23/21 0343 09/26/21 0220 09/30/21 0148 10/31/21 0919  PROT 5.1* 5.1* 5.6* 6.5  ALBUMIN 2.4* 2.2* 2.4* 3.7  AST 240* 33 21 20  ALT 370* 114* 30 13  ALKPHOS 153* 191* 114 67  BILITOT 1.4* 0.8 0.8 0.7  BILIDIR 0.7*  --   --   --   IBILI 0.7  --   --   --      Imaging: CT Chest 11/04/2021: 1. Patchy ill-defined areas of peripheral consolidation in the lungs bilaterally, favored to be of infectious or inflammatory etiology. No definitive imaging findings to suggest interstitial lung disease on today's examination. 2. Small bilateral pleural effusions with extensive areas of passive subsegmental atelectasis in the lower lobes of the lungs bilaterally. 3. Mild  cardiomegaly. 4. Aortic atherosclerosis, in addition to left main and 3 vessel coronary artery disease. 5. There are calcifications of the aortic valve. Echocardiographic correlation for evaluation of potential valvular dysfunction may be warranted if clinically indicated.   Aortic Atherosclerosis  Chest Xray 10/31/2021: The heart size is enlarged. Mediastinal contour is normal. There is increased pulmonary interstitium bilaterally. Probable small left pleural effusion is identified. There is no focal pneumonia. No acute abnormality is identified in the osseous structures.   IMPRESSION: Congestive heart failure.    Cardiac Studies:  EKG 10/31/2021: Sinus bradycardia Borderline prolonged PR interval IVCD, consider atypical RBBB LVH with secondary repolarization abnormality Inferior infarct, old Anterior Q waves, possibly due to LVH  Echocardiogram 09/24/2021:  1. Left ventricular ejection fraction, by estimation, is 55 to 60%. The  left ventricle has normal function. The left ventricle has no regional  wall motion abnormalities.   2. Right ventricular systolic function is normal. The right ventricular  size is normal.   3. Left atrial size was mildly dilated.   4. Mild mitral valve regurgitation. There is moderate holosystolic  prolapse of the middle scallop of the posterior leaflet of the mitral  valve.   5. The aortic valve is calcified. Aortic valve regurgitation is not  visualized.   Conclusion(s)/Recommendation(s): No evidence of valvular vegetations on  this transthoracic echocardiogram. Would recommend a transesophageal  echocardiogram to exclude infective endocarditis if clinically indicated.   Assessment & Recommendations:  87 year old Caucasian female with hypertension, hyperlipidemia, CKD, now admitted with shortness of breath, bradycardia  Acute hypoxic respiratory failure: Improving with diuresis. If has recurrent events, will need pulmonary evaluation  for possible inflammatory or infectious etiology noted on CT chest  Acute on chronic HFpEF: Improving.  No significant MR. Holding lasix today. Will resume PO lasix on discharge  Complete AV block: Intermittent. Today, she has 2:1 rhythm with ventricular rate in 40s. Temp pacer is inconsistently capturing. Fortunately, she is getting PPM later today.  Elevated troponin: Likely type II MI in the  setting of acute heart failure.  Not acute coronary syndrome. 87 yo hold Aspirin post PPM  AKI/CKD: Likely cardiorenal syndrome.  Now resolved  Hyponatremia: Most likely dilutional in the setting of acute heart failure.   Now improving.  CRITICAL CARE Performed by: Vernell Leep   Total critical care time: 37 minutes   Critical care time was exclusive of separately billable procedures and treating other patients.   Critical care was necessary to treat or prevent imminent or life-threatening deterioration.   Critical care was time spent personally by me on the following activities: development of treatment plan with patient and/or surrogate as well as nursing, discussions with consultants, evaluation of patient's response to treatment, examination of patient, obtaining history from patient or surrogate, ordering and performing treatments and interventions, ordering and review of laboratory studies, ordering and review of radiographic studies, pulse oximetry and re-evaluation of patient's condition.       Nigel Mormon, MD Pager: [phone] Office: [phone]

## 2021-11-05 NOTE — Progress Notes (Signed)
Advanced Heart Failure Rounding Note  PCP-Cardiologist: Dr. Einar Gip    Subjective:    Off milrinone.   TEE yesterday without significant MR. Normal LV and RV function.   Diuresed 4.2 L on lasix 40 IV yesterday. Weight down 6 pounds. Breathing better. No orthopnea or PND  Remains in CHB with stable escape. SBP 150-180   Objective:   Weight Range: 78.8 kg Body mass index is 37.59 kg/m.   Vital Signs:   Temp:  [97.6 F (36.4 C)-98 F (36.7 C)] 97.9 F (36.6 C) (11/18 0816) Pulse Rate:  [38-83] 59 (11/18 0700) Resp:  [12-33] 20 (11/18 0700) BP: (99-185)/(40-90) 156/57 (11/18 0700) SpO2:  [93 %-100 %] 96 % (11/18 0700) Weight:  [78.8 kg] 78.8 kg (11/18 0425) Last BM Date: 11/02/21  Weight change: Filed Weights   11/02/21 0513 11/04/21 0500 11/05/21 0425  Weight: 82.6 kg 81.2 kg 78.8 kg    Intake/Output:   Intake/Output Summary (Last 24 hours) at 11/05/2021 1012 Last data filed at 11/05/2021 0717 Gross per 24 hour  Intake 1060 ml  Output 4175 ml  Net -3115 ml       Physical Exam    General:  Elderly woman sitting up in bed  No resp difficulty HEENT: normal Neck: supple. JVP 8  Carotids 2+ bilat; no bruits. No lymphadenopathy or thryomegaly appreciated. Cor: PMI nondisplaced. Irregular rate & rhythm. No rubs, gallops or murmurs. Lungs: clear Abdomen: soft, nontender, nondistended. No hepatosplenomegaly. No bruits or masses. Good bowel sounds. Extremities: no cyanosis, clubbing, rash, edema Neuro: alert & orientedx3, cranial nerves grossly intact. moves all 4 extremities w/o difficulty. Affect pleasant   Telemetry   CHB with escape 50-60. Personally reviewed  Labs    CBC Recent Labs    11/03/21 0443 11/03/21 1657 11/04/21 1931  WBC 8.3  --   --   HGB 8.1* 8.7* 8.8*  HCT 23.7* 25.0* 26.4*  MCV 84.6  --   --   PLT 279  --   --     Basic Metabolic Panel Recent Labs    11/04/21 0636 11/05/21 0418  NA 128* 130*  K 4.5 4.5  CL 98 96*   CO2 22 26  GLUCOSE 102* 96  BUN 29* 18  CREATININE 1.90* 1.28*  CALCIUM 8.8* 8.9    Liver Function Tests No results for input(s): AST, ALT, ALKPHOS, BILITOT, PROT, ALBUMIN in the last 72 hours.  No results for input(s): LIPASE, AMYLASE in the last 72 hours. Cardiac Enzymes No results for input(s): CKTOTAL, CKMB, CKMBINDEX, TROPONINI in the last 72 hours.  BNP: BNP (last 3 results) Recent Labs    10/31/21 0919 11/03/21 1657 11/04/21 0636  BNP 525.0* 597.9* 512.9*     ProBNP (last 3 results) No results for input(s): PROBNP in the last 8760 hours.   D-Dimer No results for input(s): DDIMER in the last 72 hours. Hemoglobin A1C No results for input(s): HGBA1C in the last 72 hours. Fasting Lipid Panel No results for input(s): CHOL, HDL, LDLCALC, TRIG, CHOLHDL, LDLDIRECT in the last 72 hours. Thyroid Function Tests No results for input(s): TSH, T4TOTAL, T3FREE, THYROIDAB in the last 72 hours.  Invalid input(s): FREET3   Other results:   Imaging    CT Chest High Resolution  Result Date: 11/05/2021 CLINICAL DATA:  87 year old female with history of shortness of breath. EXAM: CT CHEST WITHOUT CONTRAST TECHNIQUE: Multidetector CT imaging of the chest was performed following the standard protocol without intravenous contrast. High resolution imaging of  the lungs, as well as inspiratory and expiratory imaging, was performed. COMPARISON:  Chest CT 09/22/2021. FINDINGS: Cardiovascular: Heart size is mildly enlarged. There is no significant pericardial fluid, thickening or pericardial calcification. There is aortic atherosclerosis, as well as atherosclerosis of the great vessels of the mediastinum and the coronary arteries, including calcified atherosclerotic plaque in the left main, left anterior descending, left circumflex and right coronary arteries. Calcifications of the aortic valve. Right-sided internal jugular pacemaker lead with tip terminating in the right ventricular  apex. Mediastinum/Nodes: No pathologically enlarged mediastinal or hilar lymph nodes. Esophagus is unremarkable in appearance. No axillary lymphadenopathy. Lungs/Pleura: There are some scattered ill-defined areas of apparent airspace consolidation in the lungs bilaterally, most evident in the periphery of the lungs, likely of infectious or inflammatory etiology. High-resolution images otherwise demonstrate no generalized areas of ground-glass attenuation, septal thickening, subpleural reticulation, traction bronchiectasis or honeycombing to indicate interstitial lung disease. Inspiratory and expiratory imaging demonstrates some mild air trapping indicative of small airways disease. Small bilateral pleural effusions lying predominantly dependently on the left and in the sub pulmonic region on the right with areas of apparent passive subsegmental atelectasis in the lower lobes of the lungs bilaterally. No definite suspicious appearing pulmonary nodules or masses are noted. Upper Abdomen: Aortic atherosclerosis. Musculoskeletal: There are no aggressive appearing lytic or blastic lesions noted in the visualized portions of the skeleton. IMPRESSION: 1. Patchy ill-defined areas of peripheral consolidation in the lungs bilaterally, favored to be of infectious or inflammatory etiology. No definitive imaging findings to suggest interstitial lung disease on today's examination. 2. Small bilateral pleural effusions with extensive areas of passive subsegmental atelectasis in the lower lobes of the lungs bilaterally. 3. Mild cardiomegaly. 4. Aortic atherosclerosis, in addition to left main and 3 vessel coronary artery disease. 5. There are calcifications of the aortic valve. Echocardiographic correlation for evaluation of potential valvular dysfunction may be warranted if clinically indicated. Aortic Atherosclerosis (ICD10-I70.0). Electronically Signed   By: Vinnie Langton M.D.   On: 11/05/2021 08:19   ECHO TEE  Result  Date: 11/04/2021    TRANSESOPHOGEAL ECHO REPORT   Patient Name:   RAZIYAH VANVLECK [facility] Date of Exam: 11/04/2021 Medical Rec #:  [id number]     Height:       57.0 in Accession #:    [id number]    Weight:       179.0 lb Date of Birth:  07-16-1933     BSA:          1.716 m Patient Age:    69 years      BP:           149/60 mmHg Patient Gender: F             HR:           64 bpm. Exam Location:  Inpatient Procedure: Transesophageal Echo and Color Doppler Indications:     Mitral Regurgitation  History:         Patient has prior history of Echocardiogram examinations, most                  recent 11/02/2021. Cardiomegaly, CAD, Arrythmias:RBBB; Risk                  Factors:Hypertension.  Sonographer:     Bernadene Person RDCS Referring Phys:  [place] Diagnosing Phys: Adrian Prows MD PROCEDURE: After discussion of the risks and benefits of a TEE, an informed consent was obtained from the patient. The transesophogeal  probe was passed without difficulty through the esophogus of the patient. Local oropharyngeal anesthetic was provided with Cetacaine. Sedation performed by different physician. The patient was monitored while under deep sedation. Anesthestetic sedation was provided intravenously by Anesthesiology: 60.9mg  of Propofol. The patient's vital signs; including heart rate, blood pressure, and oxygen saturation; remained stable throughout the procedure. The patient developed no complications during the procedure. IMPRESSIONS  1. Left ventricular ejection fraction, by estimation, is 60 to 65%. The left ventricle has normal function. The left ventricle has no regional wall motion abnormalities.  2. Right ventricular systolic function is normal. The right ventricular size is normal.  3. Left atrial size was moderately dilated.  4. Pacer lead noted.  5. The mitral valve is myxomatous. Mild mitral valve regurgitation.  6. The aortic valve is normal in structure. Aortic valve regurgitation is trivial. No aortic stenosis is  present. Conclusion(s)/Recommendation(s): Quick study done to decrease respiratory compromise. Microvegetations cannot be excluded in a degenerated myxomatous valve. Repeat TEE in 4 weeks if clinically indicated. FINDINGS  Left Ventricle: Left ventricular ejection fraction, by estimation, is 60 to 65%. The left ventricle has normal function. The left ventricle has no regional wall motion abnormalities. The left ventricular internal cavity size was normal in size. There is  no left ventricular hypertrophy. Right Ventricle: The right ventricular size is normal. No increase in right ventricular wall thickness. Right ventricular systolic function is normal. Left Atrium: Left atrial size was moderately dilated. Right Atrium: Right atrial size was normal in size. Pacer lead noted. Pericardium: There is no evidence of pericardial effusion. Mitral Valve: The mitral valve is myxomatous. Mild mitral valve regurgitation. Tricuspid Valve: The tricuspid valve is normal in structure. Tricuspid valve regurgitation is trivial. No evidence of tricuspid stenosis. Aortic Valve: The aortic valve is normal in structure. Aortic valve regurgitation is trivial. No aortic stenosis is present. Pulmonic Valve: The pulmonic valve was normal in structure. Pulmonic valve regurgitation is not visualized. No evidence of pulmonic stenosis. Aorta: The aortic root is normal in size and structure. IAS/Shunts: No atrial level shunt detected by color flow Doppler. Adrian Prows MD Electronically signed by Adrian Prows MD Signature Date/Time: 11/04/2021/6:14:14 PM    Final      Medications:     Scheduled Medications:  aspirin EC  81 mg Oral Daily   Chlorhexidine Gluconate Cloth  6 each Topical Daily   DULoxetine  20 mg Oral Daily   enoxaparin (LOVENOX) injection  30 mg Subcutaneous Q24H   furosemide  40 mg Intravenous Daily   gabapentin  100 mg Oral QHS   gentamicin irrigation  80 mg Irrigation On Call   magnesium oxide  400 mg Oral BID   mouth  rinse  15 mL Mouth Rinse BID   mupirocin ointment  1 application Nasal BID   pantoprazole  40 mg Oral Daily   potassium chloride  40 mEq Oral BID   sodium chloride flush  3 mL Intravenous Q12H   sodium chloride flush  3 mL Intravenous Q12H   sodium chloride flush  3 mL Intravenous Q12H   sodium chloride  1 g Oral BID WC   tamsulosin  0.4 mg Oral QPC supper   trimethoprim  100 mg Oral Daily    Infusions:  sodium chloride     sodium chloride     sodium chloride 50 mL/hr at 11/05/21 0928   sodium chloride      ceFAZolin (ANCEF) IV      PRN Medications: sodium chloride,  sodium chloride, acetaminophen, albuterol, LORazepam, melatonin, ondansetron (ZOFRAN) IV, sodium chloride flush, sodium chloride flush  Assessment/Plan   Mitral Regurgitation  - TTE  done 10/22 for bacteremia showed moderate holosystolic prolapse of the middle scallop of the posterior leaflet of the mitral valve w/ mild regurgitation. Did not get TEE - Now w/ admitted w/ a/c CHF/ pulmonary edema w/ RHC suggestive of severe MR (large V waves). However MR appears only mild on repeat TTE - TEE with no significant MR - Repeat blood cultures NGTD    2. Acute on Chronic Diastolic Heart Failure - Echo 04/2017 EF 55-60%, G1DD, No MR - Echo 10/22 EF 55-60% - RHC  mPCWP 23, RA 16. Normal CO  - Diuresed well with IV lasix. Looks euvolemic - Will switch to lasix 40mg  po daily   3. AKI on IIIa CKD  - Baseline SCr 0.6 - 1.66 on admit, 1.84>>1.89> 2.10>1.90  > 1.28 - suspect cardiorenal from MR  - improved   4. CHB - has TVP in place with some LOC - for PPM today    5. Hypervolemic Hyponatremia - Na 127>129>128  > 130 - free water restrict   6. HTN - will need better BP control once pacer placed  - will defer to Dr. Einar Gip - consider Delene Loll (had cough with ACe-I but no angioedema per records)   Suspect presentation most c/w acute pulmonary edema in setting of HTN and CHB. Hopefully should stabilize with  BP/volume management and PPM.   AHF team will sign off. Please call me if I can help.    Length of Stay: [place], MD  11/05/2021, 10:12 AM  Advanced Heart Failure Team Pager [phone] (M-F; 7a - 5p)  Please contact [facility] for night-coverage after hours (5p -7a ) and weekends on [email or web address]

## 2021-11-06 ENCOUNTER — Encounter (HOSPITAL_COMMUNITY): Payer: Self-pay | Admitting: Internal Medicine

## 2021-11-06 ENCOUNTER — Inpatient Hospital Stay (HOSPITAL_COMMUNITY): Payer: Medicare Other

## 2021-11-06 DIAGNOSIS — Z950 Presence of cardiac pacemaker: Secondary | ICD-10-CM

## 2021-11-06 DIAGNOSIS — Z45018 Encounter for adjustment and management of other part of cardiac pacemaker: Secondary | ICD-10-CM

## 2021-11-06 HISTORY — DX: Presence of cardiac pacemaker: Z95.0

## 2021-11-06 HISTORY — DX: Encounter for adjustment and management of other part of cardiac pacemaker: Z45.018

## 2021-11-06 LAB — CBC
HCT: 31.5 % — ABNORMAL LOW (ref 36.0–46.0)
Hemoglobin: 10.7 g/dL — ABNORMAL LOW (ref 12.0–15.0)
MCH: 28.9 pg (ref 26.0–34.0)
MCHC: 34 g/dL (ref 30.0–36.0)
MCV: 85.1 fL (ref 80.0–100.0)
Platelets: 491 10*3/uL — ABNORMAL HIGH (ref 150–400)
RBC: 3.7 MIL/uL — ABNORMAL LOW (ref 3.87–5.11)
RDW: 14.1 % (ref 11.5–15.5)
WBC: 6.3 10*3/uL (ref 4.0–10.5)
nRBC: 0 % (ref 0.0–0.2)

## 2021-11-06 LAB — BASIC METABOLIC PANEL
Anion gap: 11 (ref 5–15)
BUN: 12 mg/dL (ref 8–23)
CO2: 27 mmol/L (ref 22–32)
Calcium: 9.4 mg/dL (ref 8.9–10.3)
Chloride: 92 mmol/L — ABNORMAL LOW (ref 98–111)
Creatinine, Ser: 1 mg/dL (ref 0.44–1.00)
GFR, Estimated: 55 mL/min — ABNORMAL LOW (ref >60)
Glucose, Bld: 97 mg/dL (ref 70–99)
Potassium: 4.3 mmol/L (ref 3.5–5.1)
Sodium: 130 mmol/L — ABNORMAL LOW (ref 135–145)

## 2021-11-06 MED ORDER — AMLODIPINE BESYLATE 5 MG PO TABS
5.0000 mg | ORAL_TABLET | Freq: Every day | ORAL | Status: DC
  Administered 2021-11-06 – 2021-11-11 (×6): 5 mg via ORAL
  Filled 2021-11-06 (×6): qty 1

## 2021-11-06 NOTE — Progress Notes (Addendum)
Subjective:  Breathing improved  S/p permanent pacemaker  Has had issues with urinary retention Family worried about taking care of her at home  Objective:  Vital Signs in the last 24 hours: Temp:  [97.9 F (36.6 C)-98.5 F (36.9 C)] 98.5 F (36.9 C) (11/19 0400) Pulse Rate:  [0-183] 90 (11/19 0700) Resp:  [0-48] 18 (11/19 0700) BP: (119-184)/(39-113) 165/67 (11/19 0700) SpO2:  [0 %-100 %] 98 % (11/19 0700) Weight:  [74.2 kg] 74.2 kg (11/19 0600)  Intake/Output from previous day: 11/18 0701 - 11/19 0700 In: -  Out: 4100 [Urine:4100]  Physical Exam Vitals and nursing note reviewed.  Constitutional:      General: She is not in acute distress.    Appearance: She is well-developed.  HENT:     Head: Normocephalic and atraumatic.  Eyes:     Conjunctiva/sclera: Conjunctivae normal.     Pupils: Pupils are equal, round, and reactive to light.  Neck:     Vascular: No JVD.  Cardiovascular:     Rate and Rhythm: Normal rate and regular rhythm.     Pulses: Normal pulses and intact distal pulses.     Heart sounds: Murmur heard.  High-pitched blowing holosystolic murmur is present with a grade of 2/6 at the apex.     Comments: PPM placed, dressing in place left upper chest Pulmonary:     Effort: No tachypnea or respiratory distress.     Breath sounds: Examination of the right-middle field reveals rales. Examination of the left-middle field reveals rales. Examination of the right-lower field reveals rales. Examination of the left-lower field reveals rales. Rales present. No decreased breath sounds or wheezing.  Abdominal:     General: Bowel sounds are normal.     Palpations: Abdomen is soft.     Tenderness: There is no rebound.  Musculoskeletal:        General: No tenderness. Normal range of motion.     Right lower leg: No edema (Trace).     Left lower leg: No edema (Trace).  Lymphadenopathy:     Cervical: No cervical adenopathy.  Skin:    General: Skin is warm and dry.   Neurological:     Mental Status: She is alert and oriented to person, place, and time.     Cranial Nerves: No cranial nerve deficit.     Lab Results: BMP Recent Labs    11/04/21 0636 11/05/21 0418 11/06/21 0613  NA 128* 130* 130*  K 4.5 4.5 4.3  CL 98 96* 92*  CO2 22 26 27   GLUCOSE 102* 96 97  BUN 29* 18 12  CREATININE 1.90* 1.28* 1.00  CALCIUM 8.8* 8.9 9.4  GFRNONAA 25* 41* 55*     CBC Recent Labs  Lab 11/02/21 0338 11/02/21 0931 11/06/21 0613  WBC 11.1*   < > 6.3  RBC 3.20*   < > 3.70*  HGB 9.4*   < > 10.7*  HCT 26.9*   < > 31.5*  PLT 270   < > 491*  MCV 84.1   < > 85.1  MCH 29.4   < > 28.9  MCHC 34.9   < > 34.0  RDW 14.3   < > 14.1  LYMPHSABS 1.2  --   --   MONOABS 1.1*  --   --   EOSABS 0.0  --   --   BASOSABS 0.0  --   --    < > = values in this interval not displayed.     Cardiac  Panel (last 3 results) Results for LILLYAUNA, JENKINSON (MRN [id number]) as of 11/01/2021 09:56  Ref. Range 09/22/2021 08:11 09/22/2021 12:03 10/31/2021 09:19 10/31/2021 11:07  B Natriuretic Peptide Latest Ref Range: 0.0 - 100.0 pg/mL   525.0 (H)   Troponin I (High Sensitivity) Latest Ref Range: <18 ng/L 24 (H) 24 (H) 21 (H) 20 (H)    BNP (last 3 results) Recent Labs    10/31/21 0919 11/03/21 1657 11/04/21 0636  BNP 525.0* 597.9* 512.9*     TSH Recent Labs    06/19/21 0319 10/31/21 0919  TSH 1.872 1.355     Hepatic Function Panel Recent Labs    09/23/21 0343 09/26/21 0220 09/30/21 0148 10/31/21 0919  PROT 5.1* 5.1* 5.6* 6.5  ALBUMIN 2.4* 2.2* 2.4* 3.7  AST 240* 33 21 20  ALT 370* 114* 30 13  ALKPHOS 153* 191* 114 67  BILITOT 1.4* 0.8 0.8 0.7  BILIDIR 0.7*  --   --   --   IBILI 0.7  --   --   --      Imaging: CT Chest 11/04/2021: 1. Patchy ill-defined areas of peripheral consolidation in the lungs bilaterally, favored to be of infectious or inflammatory etiology. No definitive imaging findings to suggest interstitial lung disease on today's  examination. 2. Small bilateral pleural effusions with extensive areas of passive subsegmental atelectasis in the lower lobes of the lungs bilaterally. 3. Mild cardiomegaly. 4. Aortic atherosclerosis, in addition to left main and 3 vessel coronary artery disease. 5. There are calcifications of the aortic valve. Echocardiographic correlation for evaluation of potential valvular dysfunction may be warranted if clinically indicated.   Aortic Atherosclerosis  Chest Xray 10/31/2021: The heart size is enlarged. Mediastinal contour is normal. There is increased pulmonary interstitium bilaterally. Probable small left pleural effusion is identified. There is no focal pneumonia. No acute abnormality is identified in the osseous structures.   IMPRESSION: Congestive heart failure.    Cardiac Studies:  EKG 10/31/2021: Sinus bradycardia Borderline prolonged PR interval IVCD, consider atypical RBBB LVH with secondary repolarization abnormality Inferior infarct, old Anterior Q waves, possibly due to LVH  Echocardiogram 09/24/2021:  1. Left ventricular ejection fraction, by estimation, is 55 to 60%. The  left ventricle has normal function. The left ventricle has no regional  wall motion abnormalities.   2. Right ventricular systolic function is normal. The right ventricular  size is normal.   3. Left atrial size was mildly dilated.   4. Mild mitral valve regurgitation. There is moderate holosystolic  prolapse of the middle scallop of the posterior leaflet of the mitral  valve.   5. The aortic valve is calcified. Aortic valve regurgitation is not  visualized.   Conclusion(s)/Recommendation(s): No evidence of valvular vegetations on  this transthoracic echocardiogram. Would recommend a transesophageal  echocardiogram to exclude infective endocarditis if clinically indicated.   Assessment & Recommendations:  87 year old Caucasian female with hypertension, hyperlipidemia, CKD, now  admitted with shortness of breath, bradycardia  Acute hypoxic respiratory failure: Improving with diuresis. If has recurrent events, will need pulmonary evaluation for possible inflammatory or infectious etiology noted on CT chest Has rales on exam today.Resume lasix at 40 mg daily PO  Acute on chronic HFpEF: Improving.  No significant MR. Has rales on exam today.Resume lasix at 40 mg daily PO  Hypertension: Added amlodipine 5 mg today.  Complete AV block: Now s/p PPM  Elevated troponin: Likely type II MI in the setting of acute heart failure.  Not acute coronary syndrome. Okay  yo hold Aspirin post PPM  AKI/CKD: Likely cardiorenal syndrome.  Now resolved  Hyponatremia: Most likely dilutional in the setting of acute heart failure.   Stable  Ongoing barriers for discharge are issues with urinary retention, physical deconditioning. Will need OT, PT and possibly nursing home. I will request hospitalist service to take over her care today. Can be transferred to general floor.      Nigel Mormon, MD Pager: [phone] Office: [phone]

## 2021-11-06 NOTE — Care Plan (Signed)
[facility] Transfer Accept Note  87 yo F with HTN, CKD p/w SOB, bradycardia.  Started treatment for CHF, patient developed CHB.  EP consulted, placed pacer 11/18 by Dr. Curt Bears.  Now stable for transfer out of ICU. [facility] to resume care 11/20 in AM.  PT ordered.

## 2021-11-06 NOTE — Progress Notes (Signed)
Patient admitted to 4 E room 26. VSS, CCMD notified, patient resting comfortably with call bell within reach.  Edwena Blow, RN

## 2021-11-06 NOTE — Evaluation (Signed)
Physical Therapy Evaluation Patient Details Name: Deborah Jordan MRN: [id number] DOB: Jul 20, 1933 Today's Date: 11/06/2021  History of Present Illness  The pt is an 87 yo female presenting 11/13 with SOB and bradycardia. Pt found to have acute CHF exacerbation with severe MR. S/p TEE with PPM placement on 11/17. PMH includes: CHF, HTN, bil chronic knee pain, deg arthritis of hip, bil sciatica, LVH, CAD, CKD4, and recent admission for sepsis requiring intubation during stay.   Clinical Impression  Pt in bed upon arrival of PT, agreeable to evaluation at this time. Prior to admission the pt was mobilizing with rollator for ambulation in the home, relying on her daughter to provide set-up for ADLs as well as medical management. The pt now presents with limitations in functional mobility, strength, power, activity tolerance, and dynamic stability due to above dx, and will continue to benefit from skilled PT to address these deficits. The pt required minA to power up and steady from EOB, and was limited to ~25 ft ambulation in the room with use of RW. The pt is hopeful to progress to safe d/c home with intermittent family assist, and therefore will benefit from skilled PT to improve LE strength/power, and activity tolerance prior to anticipated d/c.      Recommendations for follow up therapy are one component of a multi-disciplinary discharge planning process, led by the attending physician.  Recommendations may be updated based on patient status, additional functional criteria and insurance authorization.  Follow Up Recommendations Home health PT    Assistance Recommended at Discharge Intermittent Supervision/Assistance  Functional Status Assessment Patient has had a recent decline in their functional status and demonstrates the ability to make significant improvements in function in a reasonable and predictable amount of time.  Equipment Recommendations  None recommended by PT    Recommendations for  Other Services       Precautions / Restrictions Precautions Precautions: Fall;ICD/Pacemaker Restrictions Weight Bearing Restrictions: No      Mobility  Bed Mobility Overal bed mobility: Needs Assistance Bed Mobility: Supine to Sit     Supine to sit: Min guard;HOB elevated     General bed mobility comments: minG with increased time and HOB elevated. pt needinc cues to complete movement, but no assist given    Transfers Overall transfer level: Needs assistance Equipment used: Rolling walker (2 wheels) Transfers: Sit to/from Stand Sit to Stand: Min assist           General transfer comment: minA to power up and steady, cues for hand placement on RW    Ambulation/Gait Ambulation/Gait assistance: Min assist Gait Distance (Feet): 25 Feet Assistive device: Rolling walker (2 wheels) Gait Pattern/deviations: Step-through pattern;Decreased stride length;Trunk flexed Gait velocity: decreased Gait velocity interpretation: <1.31 ft/sec, indicative of household ambulator   General Gait Details: pt with minimal clearance and trunk flexed. cues for proximity to RW, heavy reliance on BUE      Balance Overall balance assessment: Needs assistance Sitting-balance support: No upper extremity supported;Feet unsupported Sitting balance-Leahy Scale: Good     Standing balance support: Bilateral upper extremity supported;During functional activity Standing balance-Leahy Scale: Poor Standing balance comment: BUE support on RW                             Pertinent Vitals/Pain Pain Assessment: No/denies pain    Home Living Family/patient expects to be discharged to:: Private residence Living Arrangements: Children Available Help at Discharge: Family;Available PRN/intermittently Type of Home:  House Home Access: Stairs to enter Entrance Stairs-Rails: Psychiatric nurse of Steps: 4   Home Layout: Two level;Able to live on main level with  bedroom/bathroom Home Equipment: Rollator (4 wheels);Shower seat      Prior Function Prior Level of Function : Needs assist       Physical Assist : Mobility (physical);ADLs (physical) Mobility (physical): Gait ADLs (physical): Bathing;Dressing;IADLs Mobility Comments: pt using rollator since last d/c, limited to 2 min ambulation at a time ADLs Comments: requires set-up from daughter since last admission, daughter manages meds and IADLs     Hand Dominance   Dominant Hand: Right    Extremity/Trunk Assessment   Upper Extremity Assessment Upper Extremity Assessment: Overall WFL for tasks assessed    Lower Extremity Assessment Lower Extremity Assessment: Generalized weakness (poor muscular endurance and power, pt able to move BLE against gravity)    Cervical / Trunk Assessment Cervical / Trunk Assessment: Kyphotic  Communication   Communication: No difficulties  Cognition Arousal/Alertness: Awake/alert Behavior During Therapy: WFL for tasks assessed/performed Overall Cognitive Status: Within Functional Limits for tasks assessed                                 General Comments: pt able to answer all questions and follow all cues/instructions.        General Comments General comments (skin integrity, edema, etc.): VSS on RA    Exercises     Assessment/Plan    PT Assessment Patient needs continued PT services  PT Problem List Decreased strength;Decreased activity tolerance;Decreased balance;Decreased mobility       PT Treatment Interventions DME instruction;Gait training;Stair training;Functional mobility training;Therapeutic activities;Therapeutic exercise;Balance training;Patient/family education    PT Goals (Current goals can be found in the Care Plan section)  Acute Rehab PT Goals Patient Stated Goal: return home, get back to where she could safely ride her tricycle. PT Goal Formulation: With patient Time For Goal Achievement: 11/20/21 Potential  to Achieve Goals: Good    Frequency Min 3X/week    AM-PAC PT "6 Clicks" Mobility  Outcome Measure Help needed turning from your back to your side while in a flat bed without using bedrails?: A Little Help needed moving from lying on your back to sitting on the side of a flat bed without using bedrails?: A Little Help needed moving to and from a bed to a chair (including a wheelchair)?: A Little Help needed standing up from a chair using your arms (e.g., wheelchair or bedside chair)?: A Little Help needed to walk in hospital room?: A Little Help needed climbing 3-5 steps with a railing? : A Lot 6 Click Score: 17    End of Session Equipment Utilized During Treatment: Gait belt Activity Tolerance: Patient tolerated treatment well Patient left: in chair;with call bell/phone within reach;with chair alarm set;with family/visitor present Nurse Communication: Mobility status PT Visit Diagnosis: Unsteadiness on feet (R26.81);Other abnormalities of gait and mobility (R26.89);Muscle weakness (generalized) (M62.81)    Time: 8889-1694 PT Time Calculation (min) (ACUTE ONLY): 17 min   Charges:   PT Evaluation $PT Eval Low Complexity: 1 Low          West Carbo, PT, DPT   Acute Rehabilitation Department Pager #: [phone]  Sandra Cockayne 11/06/2021, 5:13 PM

## 2021-11-06 NOTE — Progress Notes (Signed)
Electrophysiology Rounding Note  Patient Name: Deborah Jordan Date of Encounter: 11/06/2021  Primary Cardiologist: Einar Gip Electrophysiologist: New to Dr. Curt Jordan   Subjective   Pacemaker implant yesterday.  She is feeling well without complaints.  Inpatient Medications    Scheduled Meds:  aspirin EC  81 mg Oral Daily   Chlorhexidine Gluconate Cloth  6 each Topical Daily   DULoxetine  20 mg Oral Daily   enoxaparin (LOVENOX) injection  30 mg Subcutaneous Q24H   furosemide  40 mg Oral Daily   gabapentin  100 mg Oral QHS   magnesium oxide  400 mg Oral BID   mouth rinse  15 mL Mouth Rinse BID   mupirocin ointment  1 application Nasal BID   pantoprazole  40 mg Oral Daily   potassium chloride  40 mEq Oral Daily   sodium chloride flush  3 mL Intravenous Q12H   sodium chloride flush  3 mL Intravenous Q12H   sodium chloride  1 g Oral BID WC   tamsulosin  0.4 mg Oral QPC supper   trimethoprim  100 mg Oral Daily   Continuous Infusions:  sodium chloride     PRN Meds: sodium chloride, acetaminophen, albuterol, LORazepam, melatonin, ondansetron (ZOFRAN) IV, sodium chloride flush   Vital Signs    Vitals:   11/06/21 0500 11/06/21 0505 11/06/21 0600 11/06/21 0700  BP: (!) 175/90 (!) 157/77 (!) 150/82 (!) 165/67  Pulse: 88 87 97 90  Resp: 14 15 19 18   Temp:      TempSrc:      SpO2: 98% 96% 94% 98%  Weight:   74.2 kg   Height:        Intake/Output Summary (Last 24 hours) at 11/06/2021 0757 Last data filed at 11/06/2021 0600 Gross per 24 hour  Intake --  Output 3750 ml  Net -3750 ml    Filed Weights   11/04/21 0500 11/05/21 0425 11/06/21 0600  Weight: 81.2 kg 78.8 kg 74.2 kg    Physical Exam    GEN: Well nourished, well developed, in no acute distress  HEENT: normal  Neck: no JVD, carotid bruits, or masses Cardiac: RRR; no murmurs, rubs, or gallops,no edema  Respiratory:  clear to auscultation bilaterally, normal work of breathing GI: soft, nontender,  nondistended, + BS MS: no deformity or atrophy  Skin: warm and dry, atrial sensed, ventricular paced device site well healed Neuro:  Strength and sensation are intact Psych: euthymic mood, full affect   Labs    CBC Recent Labs    11/04/21 1931 11/06/21 0613  WBC  --  6.3  HGB 8.8* 10.7*  HCT 26.4* 31.5*  MCV  --  85.1  PLT  --  491*    Basic Metabolic Panel Recent Labs    11/05/21 0418 11/06/21 0613  NA 130* 130*  K 4.5 4.3  CL 96* 92*  CO2 26 27  GLUCOSE 96 97  BUN 18 12  CREATININE 1.28* 1.00  CALCIUM 8.9 9.4    Liver Function Tests No results for input(s): AST, ALT, ALKPHOS, BILITOT, PROT, ALBUMIN in the last 72 hours. No results for input(s): LIPASE, AMYLASE in the last 72 hours. Cardiac Enzymes No results for input(s): CKTOTAL, CKMB, CKMBINDEX, TROPONINI in the last 72 hours.   Telemetry    Atrial sensed, ventricular paced-personally reviewed  Radiology    CT Chest High Resolution  Result Date: 11/05/2021 CLINICAL DATA:  87 year old female with history of shortness of breath. EXAM: CT CHEST WITHOUT CONTRAST TECHNIQUE:  Multidetector CT imaging of the chest was performed following the standard protocol without intravenous contrast. High resolution imaging of the lungs, as well as inspiratory and expiratory imaging, was performed. COMPARISON:  Chest CT 09/22/2021. FINDINGS: Cardiovascular: Heart size is mildly enlarged. There is no significant pericardial fluid, thickening or pericardial calcification. There is aortic atherosclerosis, as well as atherosclerosis of the great vessels of the mediastinum and the coronary arteries, including calcified atherosclerotic plaque in the left main, left anterior descending, left circumflex and right coronary arteries. Calcifications of the aortic valve. Right-sided internal jugular pacemaker lead with tip terminating in the right ventricular apex. Mediastinum/Nodes: No pathologically enlarged mediastinal or hilar lymph nodes.  Esophagus is unremarkable in appearance. No axillary lymphadenopathy. Lungs/Pleura: There are some scattered ill-defined areas of apparent airspace consolidation in the lungs bilaterally, most evident in the periphery of the lungs, likely of infectious or inflammatory etiology. High-resolution images otherwise demonstrate no generalized areas of ground-glass attenuation, septal thickening, subpleural reticulation, traction bronchiectasis or honeycombing to indicate interstitial lung disease. Inspiratory and expiratory imaging demonstrates some mild air trapping indicative of small airways disease. Small bilateral pleural effusions lying predominantly dependently on the left and in the sub pulmonic region on the right with areas of apparent passive subsegmental atelectasis in the lower lobes of the lungs bilaterally. No definite suspicious appearing pulmonary nodules or masses are noted. Upper Abdomen: Aortic atherosclerosis. Musculoskeletal: There are no aggressive appearing lytic or blastic lesions noted in the visualized portions of the skeleton. IMPRESSION: 1. Patchy ill-defined areas of peripheral consolidation in the lungs bilaterally, favored to be of infectious or inflammatory etiology. No definitive imaging findings to suggest interstitial lung disease on today's examination. 2. Small bilateral pleural effusions with extensive areas of passive subsegmental atelectasis in the lower lobes of the lungs bilaterally. 3. Mild cardiomegaly. 4. Aortic atherosclerosis, in addition to left main and 3 vessel coronary artery disease. 5. There are calcifications of the aortic valve. Echocardiographic correlation for evaluation of potential valvular dysfunction may be warranted if clinically indicated. Aortic Atherosclerosis (ICD10-I70.0). Electronically Signed   By: Vinnie Langton M.D.   On: 11/05/2021 08:19   EP PPM/ICD IMPLANT  Result Date: 11/05/2021 SURGEON:  Allegra Lai, MD   PREPROCEDURE DIAGNOSIS:  second  degree AV block   POSTPROCEDURE DIAGNOSIS:  second degree AV block    PROCEDURES:  1. Pacemaker implantation.   INTRODUCTION:  Deborah Jordan is a 87 y.o. female with a history of bradycardia who presents today for pacemaker implantation.  The patient reports intermittent episodes of dizziness over the past few months.  No reversible causes have been identified.  The patient therefore presents today for pacemaker implantation.   DESCRIPTION OF PROCEDURE:  Informed written consent was obtained, and  the patient was brought to the electrophysiology lab in a fasting state.  The patient required no sedation for the procedure today.  The patients left chest was prepped and draped in the usual sterile fashion by the EP lab staff. The skin overlying the left deltopectoral region was infiltrated with lidocaine for local analgesia.  A 4-cm incision was made over the left deltopectoral region.  A left subcutaneous pacemaker pocket was fashioned using a combination of sharp and blunt dissection. Electrocautery was required to assure hemostasis.  RA/RV Lead Placement: The left axillary vein was therefore cannulated.  Through the left axillary vein, a [employer] model Tendril MRI [id number] (serial number  [id number]) right atrial lead and an [employer] model Tendril MRI [id number] (serial number  [id number]) right ventricular lead were advanced with fluoroscopic visualization into the right atrial appendage and right ventricular apex positions respectively.  Initial atrial lead P- waves measured 4.6 mV with impedance of 628 ohms and a threshold of 1.9 V at 0.5 msec.  Right ventricular lead R-waves measured 7.7 mV with an impedance of 704 ohms and a threshold of 0.6 V at 0.5 msec.  Both leads were secured to the pectoralis fascia using #2-0 silk over the suture sleeves. Device Placement:  The leads were then connected to an [place] MRI  model [id number] (serial number  [id number] ) pacemaker.  The pocket was irrigated  with copious gentamicin solution.  The pacemaker was then placed into the pocket.  The pocket was then closed in 3 layers with 2.0 Vicryl suture for the 3.0 Vicryl suture subcutaneous and subcuticular layers.  Steri-  Strips and a sterile dressing were then applied. EBL<10ml.  There were no early apparent complications.   CONCLUSIONS:  1. Successful implantation of a [employer] Assurity MRI dual-chamber pacemaker for symptomatic bradycardia  2. No early apparent complications.       Deborah Ashraf Curt Bears, MD 11/05/2021 4:12 PM   ECHO TEE  Result Date: 11/04/2021    TRANSESOPHOGEAL ECHO REPORT   Patient Name:   Deborah Jordan [facility] Date of Exam: 11/04/2021 Medical Rec #:  [id number]     Height:       57.0 in Accession #:    [id number]    Weight:       179.0 lb Date of Birth:  1933-05-13     BSA:          1.716 m Patient Age:    25 years      BP:           149/60 mmHg Patient Gender: F             HR:           64 bpm. Exam Location:  Inpatient Procedure: Transesophageal Echo and Color Doppler Indications:     Mitral Regurgitation  History:         Patient has prior history of Echocardiogram examinations, most                  recent 11/02/2021. Cardiomegaly, CAD, Arrythmias:RBBB; Risk                  Factors:Hypertension.  Sonographer:     Bernadene Person RDCS Referring Phys:  [place] Diagnosing Phys: Adrian Prows MD PROCEDURE: After discussion of the risks and benefits of a TEE, an informed consent was obtained from the patient. The transesophogeal probe was passed without difficulty through the esophogus of the patient. Local oropharyngeal anesthetic was provided with Cetacaine. Sedation performed by different physician. The patient was monitored while under deep sedation. Anesthestetic sedation was provided intravenously by Anesthesiology: 60.9mg  of Propofol. The patient's vital signs; including heart rate, blood pressure, and oxygen saturation; remained stable throughout the procedure. The patient developed no  complications during the procedure. IMPRESSIONS  1. Left ventricular ejection fraction, by estimation, is 60 to 65%. The left ventricle has normal function. The left ventricle has no regional wall motion abnormalities.  2. Right ventricular systolic function is normal. The right ventricular size is normal.  3. Left atrial size was moderately dilated.  4. Pacer lead noted.  5. The mitral valve is myxomatous. Mild mitral valve regurgitation.  6. The aortic valve is normal in structure. Aortic valve  regurgitation is trivial. No aortic stenosis is present. Conclusion(s)/Recommendation(s): Quick study done to decrease respiratory compromise. Microvegetations cannot be excluded in a degenerated myxomatous valve. Repeat TEE in 4 weeks if clinically indicated. FINDINGS  Left Ventricle: Left ventricular ejection fraction, by estimation, is 60 to 65%. The left ventricle has normal function. The left ventricle has no regional wall motion abnormalities. The left ventricular internal cavity size was normal in size. There is  no left ventricular hypertrophy. Right Ventricle: The right ventricular size is normal. No increase in right ventricular wall thickness. Right ventricular systolic function is normal. Left Atrium: Left atrial size was moderately dilated. Right Atrium: Right atrial size was normal in size. Pacer lead noted. Pericardium: There is no evidence of pericardial effusion. Mitral Valve: The mitral valve is myxomatous. Mild mitral valve regurgitation. Tricuspid Valve: The tricuspid valve is normal in structure. Tricuspid valve regurgitation is trivial. No evidence of tricuspid stenosis. Aortic Valve: The aortic valve is normal in structure. Aortic valve regurgitation is trivial. No aortic stenosis is present. Pulmonic Valve: The pulmonic valve was normal in structure. Pulmonic valve regurgitation is not visualized. No evidence of pulmonic stenosis. Aorta: The aortic root is normal in size and structure. IAS/Shunts: No  atrial level shunt detected by color flow Doppler. Adrian Prows MD Electronically signed by Adrian Prows MD Signature Date/Time: 11/04/2021/6:14:14 PM    Final     Patient Profile     Deborah Jordan is a 87 y.o. female with a history of HTN, CKD, HLD, bifascicular block, and hyponatremia who is being seen today for the evaluation of bradycardia and heart failure at the request of Dr. Telford Nab.  Assessment & Plan    1.  Complete heart block: Status post Saint Jude dual-chamber pacemaker implanted yesterday.  Pacemaker with intact function.  Chest x-ray is without issue.  If no pneumothorax as read by the radiologist on chest x-ray, would be okay for discharge.  We [facility] arrange for follow-up in EP clinic.  2.  Acute respiratory failure: Much improved.  Plan per primary team.  For questions or updates, please contact [place] Please consult [email or web address] for contact info under Cardiology/STEMI.  Signed, Jahzion Brogden Meredith Leeds, MD  11/06/2021, 7:57 AM

## 2021-11-07 DIAGNOSIS — I5031 Acute diastolic (congestive) heart failure: Secondary | ICD-10-CM | POA: Diagnosis not present

## 2021-11-07 DIAGNOSIS — R0603 Acute respiratory distress: Secondary | ICD-10-CM | POA: Diagnosis not present

## 2021-11-07 DIAGNOSIS — J810 Acute pulmonary edema: Secondary | ICD-10-CM | POA: Diagnosis not present

## 2021-11-07 DIAGNOSIS — I5033 Acute on chronic diastolic (congestive) heart failure: Secondary | ICD-10-CM | POA: Diagnosis not present

## 2021-11-07 LAB — BASIC METABOLIC PANEL
Anion gap: 8 (ref 5–15)
BUN: 10 mg/dL (ref 8–23)
CO2: 27 mmol/L (ref 22–32)
Calcium: 9.1 mg/dL (ref 8.9–10.3)
Chloride: 94 mmol/L — ABNORMAL LOW (ref 98–111)
Creatinine, Ser: 1.05 mg/dL — ABNORMAL HIGH (ref 0.44–1.00)
GFR, Estimated: 52 mL/min — ABNORMAL LOW (ref >60)
Glucose, Bld: 114 mg/dL — ABNORMAL HIGH (ref 70–99)
Potassium: 4.4 mmol/L (ref 3.5–5.1)
Sodium: 129 mmol/L — ABNORMAL LOW (ref 135–145)

## 2021-11-07 LAB — CULTURE, BLOOD (ROUTINE X 2)
Culture: NO GROWTH
Culture: NO GROWTH

## 2021-11-07 MED ORDER — SODIUM CHLORIDE 1 G PO TABS: 1.0000 g | ORAL_TABLET | Freq: Three times a day (TID) | ORAL | Status: DC

## 2021-11-07 MED ORDER — SACUBITRIL-VALSARTAN 24-26 MG PO TABS
1.0000 | ORAL_TABLET | Freq: Two times a day (BID) | ORAL | Status: DC
  Administered 2021-11-07 – 2021-11-11 (×9): 1 via ORAL
  Filled 2021-11-07 (×9): qty 1

## 2021-11-07 MED ORDER — UREA 15 G PO PACK
30.0000 g | PACK | Freq: Three times a day (TID) | ORAL | Status: DC
  Administered 2021-11-07 (×2): 30 g via ORAL
  Filled 2021-11-07 (×3): qty 2

## 2021-11-07 MED ORDER — PHENOL 1.4 % MT LIQD
1.0000 | OROMUCOSAL | Status: DC | PRN
  Administered 2021-11-08: 1 via OROMUCOSAL
  Filled 2021-11-07: qty 177

## 2021-11-07 NOTE — TOC Progression Note (Addendum)
Transition of Care [facility] [place]) - Progression Note    Patient Details  Name: Deborah Jordan MRN: [id number] Date of Birth: [birth year]  Transition of [facility]) CM/SW [place], [place] Phone Number: [phone] 11/07/2021, 1:14 PM  Clinical Narrative:     CSW was alerted that pt's daughter wanted to speak with CSW in regards to ALF. CSW spoke with daughter and provided a list as well as answered questions. CSW also discussed PCA and provided that list as well.  TOC team will continue to assist with discharge planning needs.   Expected Discharge Plan: Assisted Living Barriers to Discharge: Continued Medical Work up  Expected Discharge Plan and Services Expected Discharge Plan: Assisted Living   Discharge Planning Services: CM Consult   Living arrangements for the past 2 months: Single Family Home                                       Social Determinants of Health (SDOH) Interventions    Readmission Risk Interventions Readmission Risk Prevention Plan 11/02/2021  Transportation Screening Complete  Medication Review Press photographer) Complete  PCP or Specialist appointment within 3-5 days of discharge Complete  HRI or [place] Complete  SW Recovery Care/Counseling Consult Complete  [place] Not Applicable  Some recent data might be hidden

## 2021-11-07 NOTE — Evaluation (Signed)
Occupational Therapy Evaluation Patient Details Name: Deborah Jordan MRN: [id number] DOB: 03-Feb-1933 Today's Date: 11/07/2021   History of Present Illness The pt is an 87 yo female presenting 11/13 with SOB and bradycardia. Pt found to have acute CHF exacerbation with severe MR. S/p TEE with PPM placement on 11/17. PMH includes: CHF, HTN, bil chronic knee pain, deg arthritis of hip, bil sciatica, LVH, CAD, CKD4, and recent admission for sepsis requiring intubation during stay.   Clinical Impression   Korina required assistance prior to this admission and endorses at least 2 falls in the past 3 months. She lives alone in a multi-level home, bed/bath on main floor, and her daughter and an aide provide assistance as much as possible. However, pt is home alone for 2-4 hours at a time on some days. Upon evaluation, pt required min guard for transfers and functional ambulation with RW, and up to min A for ADLs. Spent increased time educated on RW vs. Rollator, and strategies to decrease falls. Pt demonstrated limited insight into safety and her deficits and is a high fall risk. Pt's daughter reported extreme concern with pt going home, she is looking into ALFs. She will benefit from continued OT acutely. Recommend home with 24//7 supervision and HHOT.      Recommendations for follow up therapy are one component of a multi-disciplinary discharge planning process, led by the attending physician.  Recommendations may be updated based on patient status, additional functional criteria and insurance authorization.   Follow Up Recommendations  Home health OT    Assistance Recommended at Discharge Frequent or constant Supervision/Assistance     Equipment Recommendations   (RW)       Precautions / Restrictions Precautions Precautions: Fall;ICD/Pacemaker Restrictions Weight Bearing Restrictions: No      Mobility Bed Mobility Overal bed mobility: Needs Assistance Bed Mobility: Supine to Sit;Sit to  Supine     Supine to sit: Min guard;HOB elevated Sit to supine: Min guard;HOB elevated   General bed mobility comments: minG with increased time and HOB elevate    Transfers Overall transfer level: Needs assistance Equipment used: Rolling walker (2 wheels) Transfers: Sit to/from Stand Sit to Stand: Min guard           General transfer comment: verbal cues for hand placement      Balance Overall balance assessment: Needs assistance Sitting-balance support: No upper extremity supported;Feet unsupported Sitting balance-Leahy Scale: Good     Standing balance support: Single extremity supported;During functional activity Standing balance-Leahy Scale: Fair                             ADL either performed or assessed with clinical judgement   ADL Overall ADL's : Needs assistance/impaired Eating/Feeding: Independent;Sitting   Grooming: Oral care;Min guard;Standing Grooming Details (indicate cue type and reason): at the sink Upper Body Bathing: Supervision/ safety;Set up;Sitting   Lower Body Bathing: Minimal assistance;Sit to/from stand   Upper Body Dressing : Set up;Supervision/safety;Sitting   Lower Body Dressing: Minimal assistance;Sit to/from stand Lower Body Dressing Details (indicate cue type and reason): able to don bilat socks Toilet Transfer: Min guard;Ambulation;Rolling walker (2 wheels)   Toileting- Clothing Manipulation and Hygiene: Supervision/safety;Sitting/lateral lean       Functional mobility during ADLs: Minimal assistance;Rolling walker (2 wheels) General ADL Comments: min guard for safety, verbal cues for safety min A for standing tasks without external support     Vision Baseline Vision/History: 0 No visual deficits Ability  to See in Adequate Light: 0 Adequate Patient Visual Report: No change from baseline Vision Assessment?: No apparent visual deficits     Perception     Praxis      Pertinent Vitals/Pain Pain Assessment:  No/denies pain     Hand Dominance Right   Extremity/Trunk Assessment Upper Extremity Assessment Upper Extremity Assessment: Overall WFL for tasks assessed       Cervical / Trunk Assessment Cervical / Trunk Assessment: Kyphotic   Communication Communication Communication: No difficulties   Cognition Arousal/Alertness: Awake/alert Behavior During Therapy: WFL for tasks assessed/performed Overall Cognitive Status: Within Functional Limits for tasks assessed                                 General Comments: pt with limited insight into deficits and safety     General Comments  VSS on RA, pt reported mild dizziness after OOB functional activity, all vital signs [facility]    Exercises     Shoulder Instructions      Home Living Family/patient expects to be discharged to:: Private residence Living Arrangements: Children Available Help at Discharge: Family;Available PRN/intermittently Type of Home: House Home Access: Stairs to enter [employer] of Steps: 4 Entrance Stairs-Rails: Right;Left Home Layout: Two level;Able to live on main level with bedroom/bathroom     Bathroom Shower/Tub: Teacher, early years/pre: Standard Bathroom Accessibility: Yes   Home Equipment: Rollator (4 wheels);Shower seat;Grab bars - tub/shower          Prior Functioning/Environment Prior Level of Function : Needs assist       Physical Assist : Mobility (physical);ADLs (physical) Mobility (physical): Gait ADLs (physical): Bathing;Dressing;IADLs Mobility Comments: pt using rollator since last d/c, limited to 2 min ambulation at a time ADLs Comments: requires set-up from daughter since last admission, daughter manages meds and IADLs        OT Problem List: Decreased strength;Decreased range of motion;Decreased activity tolerance;Impaired balance (sitting and/or standing);Decreased safety awareness;Decreased knowledge of use of DME or AE;Decreased knowledge of  precautions;Pain      OT Treatment/Interventions: Self-care/ADL training;Therapeutic exercise;Therapeutic activities;Patient/family education;Balance training;DME and/or AE instruction;Energy conservation    OT Goals(Current goals can be found in the care plan section) Acute Rehab OT Goals Patient Stated Goal: home OT Goal Formulation: With patient Time For Goal Achievement: 11/21/21 Potential to Achieve Goals: Fair ADL Goals Pt Will Perform Grooming: with modified independence;standing Pt Will Perform Lower Body Dressing: sit to/from stand;with modified independence Pt Will Transfer to Toilet: with modified independence;ambulating Pt Will Perform Tub/Shower Transfer: with supervision;ambulating;shower seat;rolling walker Pt/caregiver will Perform Home Exercise Program: Increased ROM;Increased strength;Both right and left upper extremity;With written HEP provided Additional ADL Goal #1: pt will verbalize at least 2 fall prevention stratgies to prepare for safe transition home  OT Frequency: Min 2X/week   Barriers to D/C: Decreased caregiver support  pt lives alone with family and aide assisting as much as possible but unable to give 24/7       Co-evaluation              AM-PAC OT "6 Clicks" Daily Activity     Outcome Measure Help from another person eating meals?: None Help from another person taking care of personal grooming?: A Little Help from another person toileting, which includes using toliet, bedpan, or urinal?: A Little Help from another person bathing (including washing, rinsing, drying)?: A Little Help from another person to put on  and taking off regular upper body clothing?: A Little Help from another person to put on and taking off regular lower body clothing?: A Little 6 Click Score: 19   End of Session Equipment Utilized During Treatment: Gait belt;Rolling walker (2 wheels) Nurse Communication: Mobility status  Activity Tolerance: Patient tolerated treatment  well Patient left: in bed;with call bell/phone within reach;with bed alarm set;with family/visitor present  OT Visit Diagnosis: Unsteadiness on feet (R26.81);Repeated falls (R29.6);Other abnormalities of gait and mobility (R26.89);Muscle weakness (generalized) (M62.81);Pain                Time: 1310-1335 OT Time Calculation (min): 25 min Charges:  OT General Charges $OT Visit: 1 Visit OT Evaluation $OT Eval Moderate Complexity: 1 Mod OT Treatments $Self Care/Home Management : 8-22 mins   Lacye Mccarn A Edu On 11/07/2021, 1:52 PM

## 2021-11-07 NOTE — Progress Notes (Signed)
PROGRESS NOTE    Deborah Jordan  ENI:[id number] DOB: 07-01-1933 DOA: 10/31/2021 PCP: [name], MD    Brief Narrative:  Deborah Jordan was admitted to the hospital with the working diagnosis of acute diastolic heart failure exacerbation.   87 year old female past medical history for hypertension, dyslipidemia, diastolic heart failure, coronary artery disease, hyponatremia, obesity and prediabetes who presented with dyspnea, and difficulty urinating.  Recent hospitalization 10/5-09/2014 for septic shock due to urinary infection.  At home she was being followed by home health, she was noted to have bradycardia down to 33 bpm.  Her carvedilol was discontinued and amlodipine was decreased to 5 mg from 10 mg as an outpatient.  Patient reported lower extremity edema and worsening dyspnea that prompted her to call EMS.  She was found to have a heart rate in the 61W and systolic blood pressure in the 60s, she was transported to the hospital. On her initial physical examination her heart rate was 36-55, respiratory rate 16-24, blood pressure 119/98, oxygen saturation 98%.  Her lungs were clear to auscultation bilaterally, heart S1-S2, present, bradycardic, abdomen soft, no lower extremity edema.  Na 124, K 3,6, CL 91, bicarb 20, glucose 119, BUN 17, cr 1,66.  High sensitive troponin 21-20 Wbc 9,7, hgb 9,8, hct 29,0, Plt 274 SARS COVID 19 negative   Urine analysis with sg 1.011, negative nitrates and leukocytes   Chest film with mild cardiomegaly, hilar vascular congestion with increase interstitial markings bilaterally.   EKG 48 bpm, left axis deviation, right bundle branch block, QTC 531, second degree Mobitz 2, poor R wave progression, no significant ST segment or T wave changes.  Patient received furosemide for diuresis, required noninvasive mechanical ventilation. Patient continued to have bradycardia, electrophysiology was consulted for complete heart block.  Patient underwent right heart  catheterization and temporary pacemaker placement.  She was found and decompensated heart failure due to mitral regurgitation. Transfer to intensive care unit for further heart failure management. She was placed on milrinone for inotropic support.  10/17 patient was liberated from noninvasive mechanical ventilation. 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  No vegetations, mild prolapse of the posterior mitral leaflet, with mild mitral regurgitation.  10/18 patient underwent [employer] dual-chamber pacemaker implantation.  Patient very weak and deconditioned, physical therapy has recommended home health services. Patient continue to be hyponatremic Her family wants to look into assisted living facility at her discharge.   Assessment & Plan:   Principal Problem:   CHF (congestive heart failure) (HCC) Active Problems:   Hyponatremia   Hypertension   AKI (acute kidney injury) ([place])   Symptomatic bradycardia   Dysuria   Anemia   Acute heart failure with preserved ejection fraction (HFpEF) (HCC)   RBBB (right bundle branch block with left anterior fascicular block)   Hypomagnesemia   Atherosclerosis of both carotid arteries   Acute respiratory distress   Heart block AV complete (HCC)   Acute pulmonary edema (HCC)   Nonrheumatic mitral valve regurgitation   Acute diastolic heart failure exacerbation, with cardiogenic shock, acute cardiogenic pulmonary edema with acute hypoxemic respiratory failure.  Mitral valve regurgitation. HTN Patient today with improvement in volume status, her oxygenation is 94% on room air and she has no significant dyspnea.  Blood pressure is   Plan to continue heart failure management with 170/76 mmHg, and has no edema at her lower extremities.   Continue medical therapy with sacubitril valsartan. Blood pressure control with amlodipine Diuretic therapy  with furosemide   2. AKI  on CKD stage 3a, hyponatremia, hypokalemia and hypomagnesemia.  SIADH Renal function today with serum cr at 1,0, K is 4,4 and serum bicarbonate at 27, Na is 129 and Cl 94. Clinically euvolemic.   Likely urine osmolality will altered because loop diuretic use.  Patient has been on salt tablets, likely diagnosis is SIADH. Plan to change salt tablets to Urea and continue with furosemide for now.  Avoid hypokalemia.   3. Complete heart block. No sp pacer, telemetry personally reviewed with paced rhythm (100%).  4. Anemia of chronic disease. Hgb at 10,7 and hct at 31,5   5. Obesity class 2. Depression.  Calculated BMI is 35,12 Continue with duloxetine   Status is: Inpatient  Remains inpatient appropriate because: Pending improvement in Na and functional mobility    DVT prophylaxis: Enoxaparin   Code Status:    full  Family Communication:   I spoke with patient's daughter at the bedside, we talked in detail about patient's condition, plan of care and prognosis and all questions were addressed.      Consultants:  Cardiology  EP  Procedures:  Cardiac cath Pacemaker    Subjective: Patient with no nausea or vomiting, no chest pain or dyspnea, continue to be very weak and deconditioned   Objective: Vitals:   11/06/21 2330 11/07/21 0337 11/07/21 0344 11/07/21 0804  BP: (!) 152/75 (!) 156/72  (!) 170/76  Pulse: 87 98  93  Resp: 18 16  17   Temp: 98 F (36.7 C) 98.6 F (37 C)  97.9 F (36.6 C)  TempSrc: Oral Oral  Oral  SpO2: 94% 96%  94%  Weight:   73.6 kg   Height:        Intake/Output Summary (Last 24 hours) at 11/07/2021 1112 Last data filed at 11/06/2021 1200 Gross per 24 hour  Intake 240 ml  Output 450 ml  Net -210 ml   Filed Weights   11/06/21 0600 11/06/21 1632 11/07/21 0344  Weight: 74.2 kg 74.4 kg 73.6 kg    Examination:   General: Not in pain or dyspnea  Neurology: Awake and alert, non focal  E ENT: no pallor, no icterus, oral mucosa  moist Cardiovascular: No JVD. S1-S2 present, rhythmic, no gallops, rubs, or murmurs. No lower extremity edema. Pulmonary: positive breath sounds bilaterally, mild rales bilaterally but not rhonchi or wheezing Gastrointestinal. Abdomen soft and non tender Skin. No rashes Musculoskeletal: no joint deformities     Data Reviewed: I have personally reviewed following labs and imaging studies  CBC: Recent Labs  Lab 11/02/21 0338 11/02/21 0931 11/03/21 0443 11/03/21 1657 11/04/21 1931 11/06/21 0613  WBC 11.1*  --  8.3  --   --  6.3  NEUTROABS 8.7*  --   --   --   --   --   HGB 9.4* 9.5* 8.1* 8.7* 8.8* 10.7*  HCT 26.9* 28.0* 23.7* 25.0* 26.4* 31.5*  MCV 84.1  --  84.6  --   --  85.1  PLT 270  --  279  --   --  902*   Basic Metabolic Panel: Recent Labs  Lab 10/31/21 1205 10/31/21 1448 11/01/21 0405 11/02/21 0338 11/02/21 0931 11/03/21 0442 11/03/21 1657 11/04/21 0636 11/05/21 0418 11/06/21 0613  NA  --    < > 124* 127*   < > 129* 127* 128* 130* 130*  K  --    < > 4.1 4.6   < > 3.4* 3.9 4.5 4.5 4.3  CL  --    < > 94* 96*  --  97* 95* 98 96* 92*  CO2  --    < > 18* 19*  --  22 21* 22 26 27   GLUCOSE  --    < > 108* 82  --  104* 117* 102* 96 97  BUN  --    < > 18 24*  --  29* 30* 29* 18 12  CREATININE  --    < > 1.64* 1.84*  --  1.89* 2.10* 1.90* 1.28* 1.00  CALCIUM  --    < > 8.8* 8.8*  --  8.5* 8.6* 8.8* 8.9 9.4  MG 1.3*  --  1.7 2.1  --   --   --   --   --   --    < > = values in this interval not displayed.   GFR: Estimated Creatinine Clearance: 33.5 mL/min (by C-G formula based on SCr of 1 mg/dL). Liver Function Tests: No results for input(s): AST, ALT, ALKPHOS, BILITOT, PROT, ALBUMIN in the last 168 hours. No results for input(s): LIPASE, AMYLASE in the last 168 hours. No results for input(s): AMMONIA in the last 168 hours. Coagulation Profile: No results for input(s): INR, PROTIME in the last 168 hours. Cardiac Enzymes: No results for input(s): CKTOTAL, CKMB,  CKMBINDEX, TROPONINI in the last 168 hours. BNP (last 3 results) No results for input(s): PROBNP in the last 8760 hours. HbA1C: No results for input(s): HGBA1C in the last 72 hours. CBG: Recent Labs  Lab 10/31/21 1610  GLUCAP 117*   Lipid Profile: No results for input(s): CHOL, HDL, LDLCALC, TRIG, CHOLHDL, LDLDIRECT in the last 72 hours. Thyroid Function Tests: No results for input(s): TSH, T4TOTAL, FREET4, T3FREE, THYROIDAB in the last 72 hours. Anemia Panel: No results for input(s): VITAMINB12, FOLATE, FERRITIN, TIBC, IRON, RETICCTPCT in the last 72 hours.    Radiology Studies: I have reviewed all of the imaging during this hospital visit personally     Scheduled Meds:  amLODipine  5 mg Oral Daily   Chlorhexidine Gluconate Cloth  6 each Topical Daily   DULoxetine  20 mg Oral Daily   enoxaparin (LOVENOX) injection  30 mg Subcutaneous Q24H   furosemide  40 mg Oral Daily   gabapentin  100 mg Oral QHS   magnesium oxide  400 mg Oral BID   mouth rinse  15 mL Mouth Rinse BID   mupirocin ointment  1 application Nasal BID   pantoprazole  40 mg Oral Daily   potassium chloride  40 mEq Oral Daily   sacubitril-valsartan  1 tablet Oral BID   sodium chloride flush  3 mL Intravenous Q12H   sodium chloride flush  3 mL Intravenous Q12H   sodium chloride  1 g Oral BID WC   tamsulosin  0.4 mg Oral QPC supper   trimethoprim  100 mg Oral Daily   Continuous Infusions:  sodium chloride       LOS: 7 days        Jaanvi Fizer Gerome Apley, MD

## 2021-11-07 NOTE — Progress Notes (Signed)
Subjective:  Breathing improved  S/p permanent pacemaker 11/19  Objective:  Vital Signs in the last 24 hours: Temp:  [97.9 F (36.6 C)-98.6 F (37 C)] 97.9 F (36.6 C) (11/20 0804) Pulse Rate:  [82-101] 93 (11/20 0804) Resp:  [15-22] 17 (11/20 0804) BP: (96-170)/(71-76) 170/76 (11/20 0804) SpO2:  [94 %-96 %] 94 % (11/20 0804) Weight:  [73.6 kg-74.4 kg] 73.6 kg (11/20 0344)  Intake/Output from previous day: 11/19 0701 - 11/20 0700 In: 480 [P.O.:480] Out: 900 [Urine:900]  Physical Exam Vitals and nursing note reviewed.  Constitutional:      General: She is not in acute distress.    Appearance: She is well-developed.  HENT:     Head: Normocephalic and atraumatic.  Eyes:     Conjunctiva/sclera: Conjunctivae normal.     Pupils: Pupils are equal, round, and reactive to light.  Neck:     Vascular: No JVD.  Cardiovascular:     Rate and Rhythm: Normal rate and regular rhythm.     Pulses: Normal pulses and intact distal pulses.     Heart sounds: Murmur heard.  High-pitched blowing holosystolic murmur is present with a grade of 2/6 at the apex.     Comments: PPM placed, dressing in place left upper chest Pulmonary:     Effort: No tachypnea or respiratory distress.     Breath sounds: No decreased breath sounds, wheezing or rales.  Abdominal:     General: Bowel sounds are normal.     Palpations: Abdomen is soft.     Tenderness: There is no rebound.  Musculoskeletal:        General: No tenderness. Normal range of motion.     Right lower leg: No edema.     Left lower leg: No edema.  Lymphadenopathy:     Cervical: No cervical adenopathy.  Skin:    General: Skin is warm and dry.  Neurological:     Mental Status: She is alert and oriented to person, place, and time.     Cranial Nerves: No cranial nerve deficit.     Lab Results: BMP Recent Labs    11/04/21 0636 11/05/21 0418 11/06/21 0613  NA 128* 130* 130*  K 4.5 4.5 4.3  CL 98 96* 92*  CO2 22 26 27   GLUCOSE 102*  96 97  BUN 29* 18 12  CREATININE 1.90* 1.28* 1.00  CALCIUM 8.8* 8.9 9.4  GFRNONAA 25* 41* 55*     CBC Recent Labs  Lab 11/02/21 0338 11/02/21 0931 11/06/21 0613  WBC 11.1*   < > 6.3  RBC 3.20*   < > 3.70*  HGB 9.4*   < > 10.7*  HCT 26.9*   < > 31.5*  PLT 270   < > 491*  MCV 84.1   < > 85.1  MCH 29.4   < > 28.9  MCHC 34.9   < > 34.0  RDW 14.3   < > 14.1  LYMPHSABS 1.2  --   --   MONOABS 1.1*  --   --   EOSABS 0.0  --   --   BASOSABS 0.0  --   --    < > = values in this interval not displayed.     Cardiac Panel (last 3 results) Results for ALEXANDRE, LIGHTSEY (MRN [id number]) as of 11/01/2021 09:56  Ref. Range 09/22/2021 08:11 09/22/2021 12:03 10/31/2021 09:19 10/31/2021 11:07  B Natriuretic Peptide Latest Ref Range: 0.0 - 100.0 pg/mL   525.0 (H)   Troponin I (  High Sensitivity) Latest Ref Range: <18 ng/L 24 (H) 24 (H) 21 (H) 20 (H)    BNP (last 3 results) Recent Labs    10/31/21 0919 11/03/21 1657 11/04/21 0636  BNP 525.0* 597.9* 512.9*     TSH Recent Labs    06/19/21 0319 10/31/21 0919  TSH 1.872 1.355     Hepatic Function Panel Recent Labs    09/23/21 0343 09/26/21 0220 09/30/21 0148 10/31/21 0919  PROT 5.1* 5.1* 5.6* 6.5  ALBUMIN 2.4* 2.2* 2.4* 3.7  AST 240* 33 21 20  ALT 370* 114* 30 13  ALKPHOS 153* 191* 114 67  BILITOT 1.4* 0.8 0.8 0.7  BILIDIR 0.7*  --   --   --   IBILI 0.7  --   --   --      Imaging: CT Chest 11/04/2021: 1. Patchy ill-defined areas of peripheral consolidation in the lungs bilaterally, favored to be of infectious or inflammatory etiology. No definitive imaging findings to suggest interstitial lung disease on today's examination. 2. Small bilateral pleural effusions with extensive areas of passive subsegmental atelectasis in the lower lobes of the lungs bilaterally. 3. Mild cardiomegaly. 4. Aortic atherosclerosis, in addition to left main and 3 vessel coronary artery disease. 5. There are calcifications of the aortic  valve. Echocardiographic correlation for evaluation of potential valvular dysfunction may be warranted if clinically indicated.   Aortic Atherosclerosis  Chest Xray 10/31/2021: The heart size is enlarged. Mediastinal contour is normal. There is increased pulmonary interstitium bilaterally. Probable small left pleural effusion is identified. There is no focal pneumonia. No acute abnormality is identified in the osseous structures.   IMPRESSION: Congestive heart failure.    Cardiac Studies:  EKG 10/31/2021: Sinus bradycardia Borderline prolonged PR interval IVCD, consider atypical RBBB LVH with secondary repolarization abnormality Inferior infarct, old Anterior Q waves, possibly due to LVH  Echocardiogram 09/24/2021:  1. Left ventricular ejection fraction, by estimation, is 55 to 60%. The  left ventricle has normal function. The left ventricle has no regional  wall motion abnormalities.   2. Right ventricular systolic function is normal. The right ventricular  size is normal.   3. Left atrial size was mildly dilated.   4. Mild mitral valve regurgitation. There is moderate holosystolic  prolapse of the middle scallop of the posterior leaflet of the mitral  valve.   5. The aortic valve is calcified. Aortic valve regurgitation is not  visualized.   Conclusion(s)/Recommendation(s): No evidence of valvular vegetations on  this transthoracic echocardiogram. Would recommend a transesophageal  echocardiogram to exclude infective endocarditis if clinically indicated.   Assessment & Recommendations:  87 year old Caucasian female with hypertension, hyperlipidemia, CKD, now admitted with shortness of breath, bradycardia  Acute hypoxic respiratory failure: Resolved  Acute on chronic HFpEF: Acute decompensation resolved. Added Entresto 24-26 mg bid  Hypertension: Uncontrolled. Continue amlodipine 5 mg daily. Added Entresto 24-26 mg bid.  Complete AV block: Now s/p  PPM  Elevated troponin: Likely type II MI in the setting of acute heart failure.  Not acute coronary syndrome. I discontinued Aspirin today  AKI/CKD: Likely cardiorenal syndrome.  Now resolved  Hyponatremia: Most likely dilutional in the setting of acute heart failure.   Stable  Cardiology will sing off. Will arrange outpatient f/u.   Nigel Mormon, MD Pager: [phone] Office: [phone]

## 2021-11-08 ENCOUNTER — Encounter (HOSPITAL_COMMUNITY): Payer: Self-pay | Admitting: Cardiology

## 2021-11-08 DIAGNOSIS — R0603 Acute respiratory distress: Secondary | ICD-10-CM | POA: Diagnosis not present

## 2021-11-08 DIAGNOSIS — I5033 Acute on chronic diastolic (congestive) heart failure: Secondary | ICD-10-CM | POA: Diagnosis not present

## 2021-11-08 DIAGNOSIS — J810 Acute pulmonary edema: Secondary | ICD-10-CM | POA: Diagnosis not present

## 2021-11-08 DIAGNOSIS — I5031 Acute diastolic (congestive) heart failure: Secondary | ICD-10-CM | POA: Diagnosis not present

## 2021-11-08 LAB — BASIC METABOLIC PANEL
Anion gap: 9 (ref 5–15)
BUN: 36 mg/dL — ABNORMAL HIGH (ref 8–23)
CO2: 25 mmol/L (ref 22–32)
Calcium: 9.1 mg/dL (ref 8.9–10.3)
Chloride: 92 mmol/L — ABNORMAL LOW (ref 98–111)
Creatinine, Ser: 1.02 mg/dL — ABNORMAL HIGH (ref 0.44–1.00)
GFR, Estimated: 54 mL/min — ABNORMAL LOW (ref >60)
Glucose, Bld: 110 mg/dL — ABNORMAL HIGH (ref 70–99)
Potassium: 3.9 mmol/L (ref 3.5–5.1)
Sodium: 126 mmol/L — ABNORMAL LOW (ref 135–145)

## 2021-11-08 MED ORDER — ENSURE ENLIVE PO LIQD
237.0000 mL | Freq: Two times a day (BID) | ORAL | Status: DC
  Administered 2021-11-08 – 2021-11-11 (×6): 237 mL via ORAL

## 2021-11-08 MED ORDER — NYSTATIN 100000 UNIT/ML MT SUSP
5.0000 mL | Freq: Four times a day (QID) | OROMUCOSAL | Status: DC
  Administered 2021-11-08 – 2021-11-11 (×11): 500000 [IU] via ORAL
  Filled 2021-11-08 (×10): qty 5

## 2021-11-08 MED ORDER — LIDOCAINE VISCOUS HCL 2 % MT SOLN
15.0000 mL | OROMUCOSAL | Status: DC | PRN
  Filled 2021-11-08: qty 15

## 2021-11-08 MED ORDER — ENSURE MAX PROTEIN PO LIQD
11.0000 [oz_av] | Freq: Two times a day (BID) | ORAL | Status: DC
  Administered 2021-11-08 – 2021-11-10 (×5): 11 [oz_av] via ORAL
  Filled 2021-11-08 (×8): qty 330

## 2021-11-08 MED ORDER — ADULT MULTIVITAMIN W/MINERALS CH
1.0000 | ORAL_TABLET | Freq: Every day | ORAL | Status: DC
Start: 2021-11-08 — End: 2021-11-11
  Administered 2021-11-08 – 2021-11-11 (×4): 1 via ORAL
  Filled 2021-11-08 (×4): qty 1

## 2021-11-08 MED ORDER — UREA 15 G PO PACK
30.0000 g | PACK | Freq: Four times a day (QID) | ORAL | Status: DC
  Administered 2021-11-08 – 2021-11-09 (×4): 30 g via ORAL
  Filled 2021-11-08 (×7): qty 2

## 2021-11-08 MED FILL — Lidocaine HCl Local Inj 1%: INTRAMUSCULAR | Qty: 20 | Status: AC

## 2021-11-08 MED FILL — Lidocaine HCl Local Inj 1%: INTRAMUSCULAR | Qty: 60 | Status: AC

## 2021-11-08 NOTE — Progress Notes (Signed)
Physical Therapy Treatment Patient Details Name: Deborah Jordan MRN: [id number] DOB: December 27, 1932 Today's Date: 11/08/2021   History of Present Illness Pt is an 87 y.o. female admitted 10/31/21 with SOB, bradycardia. Workup for acute CHF exacerbation, severe MR. S/p TEE with PPM placement on 11/17. PMH includes CHF, HTN, arthritis, sciatica, CAD, CKD 4; of note, recent admission for sepsis requiring intubation.  PT Comments    Pt progressing with mobility. Today's session focused on ambulation for improving activity tolerance and strength; pt endorses increased fatigue this session, ambulating with rollator and intermittent min guard for balance. C/o dizziness with initial bout of activity (see BP values below). Pt remains limited by generalized weakness, decreased activity tolerance, and impaired balance strategies/postural reactions. Will continue to follow acutely to address established goals.  Post-ambulating 28' - BP 145/103, HR 90s (pt endorses dizziness) End of session seated - BP 104/56, HR 84 (pt denies dizziness)    Recommendations for follow up therapy are one component of a multi-disciplinary discharge planning process, led by the attending physician.  Recommendations may be updated based on patient status, additional functional criteria and insurance authorization.  Follow Up Recommendations  Home health PT     Assistance Recommended at Discharge Intermittent Supervision/Assistance  Equipment Recommendations  None recommended by PT    Recommendations for Other Services       Precautions / Restrictions Precautions Precautions: Fall;ICD/Pacemaker Precaution Comments: watch BP Restrictions Weight Bearing Restrictions: No     Mobility  Bed Mobility               General bed mobility comments: received sitting in recliner    Transfers Overall transfer level: Needs assistance Equipment used: Rollator (4 wheels) Transfers: Sit to/from Stand Sit to Stand: Min  guard           General transfer comment: Initial cues to lock rollator brakes prior to standing from recliner; additional sit<>stand from rollator seat and recliner with good carryover of locking brakes, intermittent min guard for balance    Ambulation/Gait Ambulation/Gait assistance: Min guard Gait Distance (Feet): 28 Feet (+ 64') Assistive device: Rollator (4 wheels) Gait Pattern/deviations: Step-through pattern;Decreased stride length;Trunk flexed Gait velocity: Decreased     General Gait Details: Slow, mostly steady gait with rollator and intermittent min guard for balance; pt with c/o lightheadedness, cued to take seated rest brake on rollator; additional ambulation distance when symptoms improved. Pt declined further mobility secondary to fatigue   Stairs Stairs:  (pt declined)           Wheelchair Mobility    Modified Rankin (Stroke Patients Only)       Balance Overall balance assessment: Needs assistance Sitting-balance support: No upper extremity supported;Feet unsupported Sitting balance-Leahy Scale: Good     Standing balance support: Single extremity supported;During functional activity Standing balance-Leahy Scale: Fair Standing balance comment: can static stand without UE support; static and dynamic stability improved with rollator                            Cognition Arousal/Alertness: Awake/alert Behavior During Therapy: WFL for tasks assessed/performed;Flat affect Overall Cognitive Status: Within Functional Limits for tasks assessed                                 General Comments: When initiating discussion regarding d/c and pt's desire to return home vs ALF, pt continues to state it's up  to her daughter        Exercises      General Comments General comments (skin integrity, edema, etc.): pt with c/o dizziness after brief ambulation distance, seated BP 145/103, HR 90s; additional ambulation distance, return to  chair BP 104/56, HR 84, pt denies dizziness      Pertinent Vitals/Pain Pain Assessment: No/denies pain    Home Living                          Prior Function            PT Goals (current goals can now be found in the care plan section) Progress towards PT goals: Progressing toward goals    Frequency    Min 3X/week      PT Plan Current plan remains appropriate    Co-evaluation              AM-PAC PT "6 Clicks" Mobility   Outcome Measure  Help needed turning from your back to your side while in a flat bed without using bedrails?: A Little Help needed moving from lying on your back to sitting on the side of a flat bed without using bedrails?: A Little Help needed moving to and from a bed to a chair (including a wheelchair)?: A Little Help needed standing up from a chair using your arms (e.g., wheelchair or bedside chair)?: A Little Help needed to walk in hospital room?: A Little Help needed climbing 3-5 steps with a railing? : A Little 6 Click Score: 18    End of Session Equipment Utilized During Treatment: Gait belt Activity Tolerance: Patient tolerated treatment well;Patient limited by fatigue Patient left: in chair;with call bell/phone within reach;with chair alarm set Nurse Communication: Mobility status PT Visit Diagnosis: Unsteadiness on feet (R26.81);Other abnormalities of gait and mobility (R26.89);Muscle weakness (generalized) (M62.81)     Time: 1245-8099 PT Time Calculation (min) (ACUTE ONLY): 21 min  Charges:  $Therapeutic Exercise: 8-22 mins                     Mabeline Caras, PT, DPT Acute Rehabilitation Services  Pager [phone] Office [place] 11/08/2021, 3:50 PM

## 2021-11-08 NOTE — Progress Notes (Addendum)
PROGRESS NOTE    Deborah Jordan  IOE:[id number] DOB: 1933-01-01 DOA: 10/31/2021 PCP: Deborah Jordan, MD    Brief Narrative:  Deborah Jordan was admitted to the hospital with the working diagnosis of acute diastolic heart failure exacerbation, complicated with complete heart block. Now sp pacer.    87 year old female past medical history for hypertension, dyslipidemia, diastolic heart failure, coronary artery disease, hyponatremia, obesity and prediabetes who presented with dyspnea, and difficulty urinating.  Recent hospitalization 10/5-09/2014 for septic shock due to urinary infection.  At home she was being followed by home health, she was noted to have bradycardia down to 33 bpm.  Her carvedilol was discontinued and amlodipine was decreased to 5 mg from 10 mg as an outpatient.  Patient reported lower extremity edema and worsening dyspnea that prompted her to call EMS.  She was found to have a heart rate in the 18E and systolic blood pressure in the 60s, she was transported to the hospital. On her initial physical examination her heart rate was 36-55, respiratory rate 16-24, blood pressure 119/98, oxygen saturation 98%.  Her lungs were clear to auscultation bilaterally, heart S1-S2, present, bradycardic, abdomen soft, no lower extremity edema.   Na 124, K 3,6, CL 91, bicarb 20, glucose 119, BUN 17, cr 1,66.  High sensitive troponin 21-20 Wbc 9,7, hgb 9,8, hct 29,0, Plt 274 SARS COVID 19 negative    Urine analysis with sg 1.011, negative nitrates and leukocytes    Chest film with mild cardiomegaly, hilar vascular congestion with increase interstitial markings bilaterally.    EKG 48 bpm, left axis deviation, right bundle branch block, QTC 531, second degree Mobitz 2, poor R wave progression, no significant ST segment or T wave changes.   Patient received furosemide for diuresis, required noninvasive mechanical ventilation. Patient continued to have bradycardia, electrophysiology was consulted  for complete heart block.   Patient underwent right heart catheterization and temporary pacemaker placement.  She was found and decompensated heart failure due to mitral regurgitation. Transfer to intensive care unit for further heart failure management. She was placed on milrinone for inotropic support.   10/17 patient was liberated from noninvasive mechanical ventilation. 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  No vegetations, mild prolapse of the posterior mitral leaflet, with mild mitral regurgitation.   10/18 patient underwent [employer] Jude dual-chamber pacemaker implantation.   Patient very weak and deconditioned, physical therapy has recommended home health services. Patient continue to be hyponatremic Her family wants to look into assisted living facility at her discharge.   Pending improvement of serum Na before discharge.    Assessment & Plan:   Principal Problem:   CHF (congestive heart failure) (HCC) Active Problems:   Hyponatremia   Hypertension   AKI (acute kidney injury) ([place])   Symptomatic bradycardia   Dysuria   Anemia   Acute heart failure with preserved ejection fraction (HFpEF) (HCC)   RBBB (right bundle branch block with left anterior fascicular block)   Hypomagnesemia   Atherosclerosis of both carotid arteries   Acute respiratory distress   Heart block AV complete (HCC)   Acute pulmonary edema (HCC)   Nonrheumatic mitral valve regurgitation    Acute diastolic heart failure exacerbation, with cardiogenic shock, acute cardiogenic pulmonary edema with acute hypoxemic respiratory failure.  Mitral valve regurgitation. HTN No clinical signs of heart failure today.  Continue with furosemide to keep negative fluid balance, medical therapy with sacubitril and valsartan. Blood pressure control with amlodipine.  2. AKI on CKD stage 3a, hyponatremia, hypokalemia and hypomagnesemia.   SIADH Worsening Na today, she had increase water intake due to oral ulcers.  Add oral nystatin for oral ulcers. (Possible thrush).   Increase Urea to QID for today and will add protein supplements. Liberate diet and consult nutrition Continue with furosemide for now and continue with fluids restriction 1200 ml.     3. Complete heart block. Now sp pacer, telemetry personally reviewed with paced rhythm (100%). Will need outpatient follow up.    4. Anemia of chronic disease. Hgb at 10,7 and hct at 31,5    5. Obesity class 2. Depression.  Calculated BMI is 35,12 On duloxetine   Patient continue to be at high risk for worsening hyponatremia   Status is: Inpatient  Remains inpatient appropriate because: worsening hyponatremia   DVT prophylaxis: Enoxaparin   Code Status:    full  Family Communication:   I spoke over the phone with the patient's daughter about patient's  condition, plan of care, prognosis and all questions were addressed.    Consultants:  Cardiology  EP   Subjective: Patient with no nausea or vomiting, positive oral pain, ulcer, with no nausea or vomiting, no chest pain or dyspnea.   Objective: Vitals:   11/07/21 1932 11/07/21 2351 11/08/21 0309 11/08/21 0725  BP: (!) 102/59 137/85 128/63 129/72  Pulse: 87 89 85 92  Resp: 20 20 19 20   Temp: 97.8 F (36.6 C) 98 F (36.7 C) (!) 97.5 F (36.4 C) 97.6 F (36.4 C)  TempSrc: Oral Oral Oral Oral  SpO2: 94% 91% 93% 95%  Weight:      Height:        Intake/Output Summary (Last 24 hours) at 11/08/2021 1137 Last data filed at 11/08/2021 1000 Gross per 24 hour  Intake --  Output 300 ml  Net -300 ml   Filed Weights   11/06/21 0600 11/06/21 1632 11/07/21 0344  Weight: 74.2 kg 74.4 kg 73.6 kg    Examination:   General: Not in pain or dyspnea, deconditioned  Neurology: Awake and alert, non focal  E ENT: mild pallor, no icterus, oral mucosa moist Cardiovascular: No JVD. S1-S2 present, rhythmic, no gallops,  rubs, or murmurs. No lower extremity edema. Pulmonary: positive breath sounds bilaterally, adequate air movement, no wheezing, rhonchi or rales. Gastrointestinal. Abdomen soft and non tender Skin. No rashes Musculoskeletal: no joint deformities     Data Reviewed: I have personally reviewed following labs and imaging studies  CBC: Recent Labs  Lab 11/02/21 0338 11/02/21 0931 11/03/21 0443 11/03/21 1657 11/04/21 1931 11/06/21 0613  WBC 11.1*  --  8.3  --   --  6.3  NEUTROABS 8.7*  --   --   --   --   --   HGB 9.4* 9.5* 8.1* 8.7* 8.8* 10.7*  HCT 26.9* 28.0* 23.7* 25.0* 26.4* 31.5*  MCV 84.1  --  84.6  --   --  85.1  PLT 270  --  279  --   --  093*   Basic Metabolic Panel: Recent Labs  Lab 11/02/21 0338 11/02/21 0931 11/04/21 0636 11/05/21 0418 11/06/21 0613 11/07/21 1016 11/08/21 0243  NA 127*   < > 128* 130* 130* 129* 126*  K 4.6   < > 4.5 4.5 4.3 4.4 3.9  CL 96*   < > 98 96* 92* 94* 92*  CO2 19*   < > 22 26 27 27 25   GLUCOSE 82   < > 102*  96 97 114* 110*  BUN 24*   < > 29* 18 12 10  36*  CREATININE 1.84*   < > 1.90* 1.28* 1.00 1.05* 1.02*  CALCIUM 8.8*   < > 8.8* 8.9 9.4 9.1 9.1  MG 2.1  --   --   --   --   --   --    < > = values in this interval not displayed.   GFR: Estimated Creatinine Clearance: 32.9 mL/min (A) (by C-G formula based on SCr of 1.02 mg/dL (H)). Liver Function Tests: No results for input(s): AST, ALT, ALKPHOS, BILITOT, PROT, ALBUMIN in the last 168 hours. No results for input(s): LIPASE, AMYLASE in the last 168 hours. No results for input(s): AMMONIA in the last 168 hours. Coagulation Profile: No results for input(s): INR, PROTIME in the last 168 hours. Cardiac Enzymes: No results for input(s): CKTOTAL, CKMB, CKMBINDEX, TROPONINI in the last 168 hours. BNP (last 3 results) No results for input(s): PROBNP in the last 8760 hours. HbA1C: No results for input(s): HGBA1C in the last 72 hours. CBG: No results for input(s): GLUCAP in the last  168 hours. Lipid Profile: No results for input(s): CHOL, HDL, LDLCALC, TRIG, CHOLHDL, LDLDIRECT in the last 72 hours. Thyroid Function Tests: No results for input(s): TSH, T4TOTAL, FREET4, T3FREE, THYROIDAB in the last 72 hours. Anemia Panel: No results for input(s): VITAMINB12, FOLATE, FERRITIN, TIBC, IRON, RETICCTPCT in the last 72 hours.    Radiology Studies: I have reviewed all of the imaging during this hospital visit personally     Scheduled Meds:  amLODipine  5 mg Oral Daily   Chlorhexidine Gluconate Cloth  6 each Topical Daily   DULoxetine  20 mg Oral Daily   enoxaparin (LOVENOX) injection  30 mg Subcutaneous Q24H   furosemide  40 mg Oral Daily   gabapentin  100 mg Oral QHS   magnesium oxide  400 mg Oral BID   mouth rinse  15 mL Mouth Rinse BID   mupirocin ointment  1 application Nasal BID   pantoprazole  40 mg Oral Daily   sacubitril-valsartan  1 tablet Oral BID   tamsulosin  0.4 mg Oral QPC supper   trimethoprim  100 mg Oral Daily   urea  30 g Oral QID   Continuous Infusions:   LOS: 8 days        Noelly Lasseigne Gerome Apley, MD

## 2021-11-08 NOTE — Progress Notes (Signed)
Initial Nutrition Assessment  DOCUMENTATION CODES:   Non-severe (moderate) malnutrition in context of chronic illness  INTERVENTION:   Ensure Enlive po TID, each supplement provides 350 kcal and 20 grams of protein MVI with minerals daily  NUTRITION DIAGNOSIS:   Moderate Malnutrition related to chronic illness (CHF) as evidenced by mild muscle depletion, percent weight loss (10% weight loss within a month).  GOAL:   Patient will meet greater than or equal to 90% of their needs  MONITOR:   PO intake, Supplement acceptance, Labs  REASON FOR ASSESSMENT:   Consult Assessment of nutrition requirement/status  ASSESSMENT:   87 yo female admitted with CHF exacerbation, complete heart block. PMH includes HTN, GERD, DDD, spinal stenosis, DDD, LVH, CHF, CKD stage 4, vitamin D deficiency, CAD, pacemaker.  Spoke with patient and her son at bedside.  Patient has never been a big eater, but since admission intake has been very poor.   Meal intakes documented at 20-40%.  Diet has been liberalized to regular d/t poor intake. She agreed to drink Ensure supplements between meals. Son is encouraging good intake.  Labs reviewed. Na 126  Medications reviewed and include Lasix, Mag-Ox, Protonix, Flomax, Ure-NA.  Weight encounters reviewed. 10% weight loss within the past month is significant. Some of the weight loss is likely r/t fluids with hx of CHF, however, when paired with mild muscle depletion, patient meets criteria for moderate malnutrition.  NUTRITION - FOCUSED PHYSICAL EXAM:  Flowsheet Row Most Recent Value  Orbital Region No depletion  Upper Arm Region Mild depletion  Thoracic and Lumbar Region No depletion  Buccal Region No depletion  Temple Region No depletion  Clavicle Bone Region Mild depletion  Clavicle and Acromion Bone Region Mild depletion  Scapular Bone Region Mild depletion  Dorsal Hand Mild depletion  Patellar Region No depletion  Anterior Thigh Region No  depletion  Posterior Calf Region Mild depletion  Edema (RD Assessment) None  Hair Reviewed  Eyes Reviewed  Mouth Reviewed  [blister on roof of mouth and rash on tongue]  Skin Reviewed  Nails Reviewed       Diet Order:   Diet Order             Diet regular Room service appropriate? Yes with Assist; Fluid consistency: Thin; Fluid restriction: 1200 mL Fluid  Diet effective now                   EDUCATION NEEDS:   Not appropriate for education at this time  Skin:  Skin Assessment: Skin Integrity Issues: Skin Integrity Issues:: Incisions Incisions: sternal incision S/P pacemaker placement  Last BM:  11/21  Height:   Ht Readings from Last 1 Encounters:  11/06/21 4\' 9"  (1.448 m)    Weight:   Wt Readings from Last 1 Encounters:  11/07/21 73.6 kg    BMI:  Body mass index is 35.12 kg/m.  Estimated Nutritional Needs:   Kcal:  1600-1800  Protein:  80-90 gm  Fluid:  1.6-1.8 L    Lucas Mallow, RD, LDN, CNSC Please refer to Amion for contact information.

## 2021-11-08 NOTE — Care Management Important Message (Signed)
Important Message  Patient Details  Name: Deborah Jordan MRN: [id number] Date of Birth: October 04, 1933   Medicare Important Message Given:  Yes     [name] 11/08/2021, 9:53 AM

## 2021-11-08 NOTE — Plan of Care (Signed)

## 2021-11-09 DIAGNOSIS — I5031 Acute diastolic (congestive) heart failure: Secondary | ICD-10-CM | POA: Diagnosis not present

## 2021-11-09 DIAGNOSIS — E440 Moderate protein-calorie malnutrition: Secondary | ICD-10-CM | POA: Diagnosis present

## 2021-11-09 DIAGNOSIS — I5033 Acute on chronic diastolic (congestive) heart failure: Secondary | ICD-10-CM | POA: Diagnosis not present

## 2021-11-09 DIAGNOSIS — J810 Acute pulmonary edema: Secondary | ICD-10-CM | POA: Diagnosis not present

## 2021-11-09 DIAGNOSIS — R0603 Acute respiratory distress: Secondary | ICD-10-CM | POA: Diagnosis not present

## 2021-11-09 LAB — BASIC METABOLIC PANEL
Anion gap: 12 (ref 5–15)
BUN: 108 mg/dL — ABNORMAL HIGH (ref 8–23)
CO2: 22 mmol/L (ref 22–32)
Calcium: 10 mg/dL (ref 8.9–10.3)
Chloride: 95 mmol/L — ABNORMAL LOW (ref 98–111)
Creatinine, Ser: 1.31 mg/dL — ABNORMAL HIGH (ref 0.44–1.00)
GFR, Estimated: 40 mL/min — ABNORMAL LOW (ref >60)
Glucose, Bld: 166 mg/dL — ABNORMAL HIGH (ref 70–99)
Potassium: 3.9 mmol/L (ref 3.5–5.1)
Sodium: 129 mmol/L — ABNORMAL LOW (ref 135–145)

## 2021-11-09 MED ORDER — SODIUM CHLORIDE 1 G PO TABS
2.0000 g | ORAL_TABLET | Freq: Three times a day (TID) | ORAL | Status: DC
  Administered 2021-11-09 – 2021-11-11 (×5): 2 g via ORAL
  Filled 2021-11-09 (×5): qty 2

## 2021-11-09 NOTE — Progress Notes (Signed)
Occupational Therapy Treatment Patient Details Name: Deborah Jordan MRN: [id number] DOB: 04/27/1933 Today's Date: 11/09/2021   History of present illness Pt is an 87 y.o. female admitted 10/31/21 with SOB, bradycardia. Workup for acute CHF exacerbation, severe MR. S/p TEE with PPM placement on 11/17. PMH includes CHF, HTN, arthritis, sciatica, CAD, CKD 4; of note, recent admission for sepsis requiring intubation.   OT comments  Brean is progressing incrementally this session with reports of general fatigue at the start of the session and required encouragement to participate. Overall pt was close min guard for all transfers and functional mobility with RW. She also complete toileting and grooming at the sink with close min guard. However pt noted to be more slow and deliberate for all tasks this session, and required forearm support. on sink ledge for grooming tasks. She continues to benefit from OT acutely. D/c recommendation remains appropriate.    Recommendations for follow up therapy are one component of a multi-disciplinary discharge planning process, led by the attending physician.  Recommendations may be updated based on patient status, additional functional criteria and insurance authorization.    Follow Up Recommendations  Home health OT    Assistance Recommended at Discharge Frequent or constant Supervision/Assistance  Equipment Recommendations  None recommended by OT       Precautions / Restrictions Precautions Precautions: Fall;ICD/Pacemaker Precaution Comments: watch BP       Mobility Bed Mobility Overal bed mobility: Needs Assistance Bed Mobility: Supine to Sit     Supine to sit: Min assist     General bed mobility comments: pt in chair upon arrival and returned to chair    Transfers Overall transfer level: Needs assistance Equipment used: Rolling walker (2 wheels) Transfers: Sit to/from Stand Sit to Stand: Min guard           General transfer comment:  for safety     Balance Overall balance assessment: Needs assistance Sitting-balance support: No upper extremity supported Sitting balance-Leahy Scale: Good     Standing balance support: Single extremity supported;During functional activity Standing balance-Leahy Scale: Poor Standing balance comment: leaning on sink to wash hands after toileting, flexed posture needing UE support for balance                           ADL either performed or assessed with clinical judgement   ADL Overall ADL's : Needs assistance/impaired     Grooming: Wash/dry hands;Oral care;Min guard;Standing Grooming Details (indicate cue type and reason): at sink, pt with forward flexion and leaning onto forearms on sink ledge                 Toilet Transfer: Min guard;Ambulation;Regular Toilet;Rolling walker (2 wheels) Toilet Transfer Details (indicate cue type and reason): min G for safety, gait is slow and deliberate Toileting- Clothing Manipulation and Hygiene: Supervision/safety;Sit to/from stand       Functional mobility during ADLs: Min guard;Rolling walker (2 wheels) General ADL Comments: pt more slow and deliberate this session, required close min guard for standing funcitonal tasks. Leaning onto sink for grooming.    Extremity/Trunk Assessment Upper Extremity Assessment Upper Extremity Assessment: Generalized weakness   Lower Extremity Assessment Lower Extremity Assessment: Defer to PT evaluation        Vision   Vision Assessment?: No apparent visual deficits   Perception Perception Perception: Not tested   Praxis Praxis Praxis: Not tested    Cognition Arousal/Alertness: Awake/alert Behavior During Therapy: Flat affect Overall Cognitive  Status: No family/caregiver present to determine baseline cognitive functioning                     General Comments: pt stating "im not excited" when asked about going home, pt realizes she is not well and required  assistance for safety          Exercises General Exercises - Lower Extremity Long Arc Quad: Strengthening;Both;10 reps;Seated Hip Flexion/Marching: Strengthening;Both;10 reps;Seated Other Exercises Other Exercises: scapular retraction x 5 reps seated Other Exercises: trunk flex/ext arms crossed over chest x 10      General Comments VSS on RA this session, pt reports general fatigue    Pertinent Vitals/ Pain       Pain Assessment: Faces Faces Pain Scale: Hurts a little bit Pain Location: shoulders Pain Descriptors / Indicators: Aching Pain Intervention(s): Limited activity within patient's tolerance;Monitored during session   Frequency  Min 2X/week        Progress Toward Goals  OT Goals(current goals can now be found in the care plan section)  Progress towards OT goals: Progressing toward goals  Acute Rehab OT Goals Patient Stated Goal: get better OT Goal Formulation: With patient Time For Goal Achievement: 11/21/21 Potential to Achieve Goals: Fair ADL Goals Pt Will Perform Grooming: with modified independence;standing Pt Will Perform Lower Body Dressing: sit to/from stand;with modified independence Pt Will Transfer to Toilet: with modified independence;ambulating Pt Will Perform Tub/Shower Transfer: with supervision;ambulating;shower seat;rolling walker Pt/caregiver will Perform Home Exercise Program: Increased ROM;Increased strength;Both right and left upper extremity;With written HEP provided Additional ADL Goal #1: pt will verbalize at least 2 fall prevention stratgies to prepare for safe transition home  Plan Discharge plan remains appropriate       AM-PAC OT "6 Clicks" Daily Activity     Outcome Measure   Help from another person eating meals?: None Help from another person taking care of personal grooming?: A Little Help from another person toileting, which includes using toliet, bedpan, or urinal?: A Little Help from another person bathing (including  washing, rinsing, drying)?: A Little Help from another person to put on and taking off regular upper body clothing?: A Little Help from another person to put on and taking off regular lower body clothing?: A Little 6 Click Score: 19    End of Session Equipment Utilized During Treatment: Gait belt;Rolling walker (2 wheels)  OT Visit Diagnosis: Unsteadiness on feet (R26.81);Repeated falls (R29.6);Other abnormalities of gait and mobility (R26.89);Muscle weakness (generalized) (M62.81);Pain   Activity Tolerance Patient tolerated treatment well   Patient Left in chair;with call bell/phone within reach   Nurse Communication Mobility status        Time: 8546-2703 OT Time Calculation (min): 16 min  Charges: OT General Charges $OT Visit: 1 Visit OT Evaluation $OT Eval Low Complexity: 1 Low OT Treatments $Self Care/Home Management : 8-22 mins   Kyler Germer A Lewis Grivas 11/09/2021, 4:39 PM

## 2021-11-09 NOTE — Progress Notes (Signed)
Physical Therapy Treatment Patient Details Name: Deborah Jordan MRN: [id number] DOB: July 01, 1933 Today's Date: 11/09/2021   History of Present Illness Pt is an 87 y.o. female admitted 10/31/21 with SOB, bradycardia. Workup for acute CHF exacerbation, severe MR. S/p TEE with PPM placement on 11/17. PMH includes CHF, HTN, arthritis, sciatica, CAD, CKD 4; of note, recent admission for sepsis requiring intubation.    PT Comments    Patient progressing minimally with activity tolerance.  She seems frustrated by her lack of energy and wants to go home.  She participated in hallway ambulation and seated therex after toileting.  She will continue to benefit from skilled PT in the acute setting.  She will need assistance for mobility and follow up HHPT at d/c.   Recommendations for follow up therapy are one component of a multi-disciplinary discharge planning process, led by the attending physician.  Recommendations may be updated based on patient status, additional functional criteria and insurance authorization.  Follow Up Recommendations  Home health PT     Assistance Recommended at Discharge    Equipment Recommendations  None recommended by PT    Recommendations for Other Services       Precautions / Restrictions Precautions Precautions: Fall;ICD/Pacemaker     Mobility  Bed Mobility Overal bed mobility: Needs Assistance Bed Mobility: Supine to Sit     Supine to sit: Min assist     General bed mobility comments: pulled up with HHA    Transfers Overall transfer level: Needs assistance Equipment used: Rollator (4 wheels) Transfers: Sit to/from Stand Sit to Stand: Min guard           General transfer comment: assist for safety with rollator and for balance    Ambulation/Gait Ambulation/Gait assistance: Min assist Gait Distance (Feet): 15 Feet (x 2 & 45' x 2) Assistive device: Rollator (4 wheels) Gait Pattern/deviations: Step-through pattern;Decreased stride  length;Trunk flexed;Shuffle       General Gait Details: flexed and pushing walker too far forward, assist & cues for posture, balance, safety; seated rest after toileting, then seated on rollator in hallway prior to return to room   Stairs             Wheelchair Mobility    Modified Rankin (Stroke Patients Only)       Balance Overall balance assessment: Needs assistance Sitting-balance support: No upper extremity supported Sitting balance-Leahy Scale: Good     Standing balance support: Reliant on assistive device for balance;During functional activity Standing balance-Leahy Scale: Poor Standing balance comment: leaning on sink to wash hands after toileting, flexed posture needing UE support for balance                            Cognition Arousal/Alertness: Awake/alert Behavior During Therapy: Flat affect Overall Cognitive Status: No family/caregiver present to determine baseline cognitive functioning                                 General Comments: decreased STM pt asking same question about plans for PT session        Exercises General Exercises - Lower Extremity Long Arc Quad: Strengthening;Both;10 reps;Seated Hip Flexion/Marching: Strengthening;Both;10 reps;Seated Other Exercises Other Exercises: scapular retraction x 5 reps seated Other Exercises: trunk flex/ext arms crossed over chest x 10    General Comments        Pertinent Vitals/Pain Pain Assessment: Faces Faces Pain Scale:  Hurts little more Pain Location: shoulders Pain Descriptors / Indicators: Aching Pain Intervention(s): Monitored during session;Repositioned    Home Living                          Prior Function            PT Goals (current goals can now be found in the care plan section) Progress towards PT goals: Progressing toward goals    Frequency    Min 3X/week      PT Plan Current plan remains appropriate    Co-evaluation               AM-PAC PT "6 Clicks" Mobility   Outcome Measure  Help needed turning from your back to your side while in a flat bed without using bedrails?: A Little Help needed moving from lying on your back to sitting on the side of a flat bed without using bedrails?: A Little Help needed moving to and from a bed to a chair (including a wheelchair)?: A Little Help needed standing up from a chair using your arms (e.g., wheelchair or bedside chair)?: A Little Help needed to walk in hospital room?: A Little Help needed climbing 3-5 steps with a railing? : A Lot 6 Click Score: 17    End of Session Equipment Utilized During Treatment: Gait belt Activity Tolerance: Patient limited by fatigue Patient left: in chair;with call bell/phone within reach   PT Visit Diagnosis: Unsteadiness on feet (R26.81);Other abnormalities of gait and mobility (R26.89);Muscle weakness (generalized) (M62.81)     Time: 6979-4801 PT Time Calculation (min) (ACUTE ONLY): 26 min  Charges:  $Gait Training: 8-22 mins $Therapeutic Exercise: 8-22 mins                     Magda Kiel, PT Acute Rehabilitation Services KPVVZ:[phone] Office:[phone] 11/09/2021    Reginia Naas 11/09/2021, 1:38 PM

## 2021-11-09 NOTE — Progress Notes (Signed)
PROGRESS NOTE    Deborah Jordan  ZOX:[id number] DOB: 24-Sep-1933 DOA: 10/31/2021 PCP: [name], MD    Brief Narrative:  Mrs. Deborah Jordan was admitted to the hospital with the working diagnosis of acute diastolic heart failure exacerbation, complicated with complete heart block. Now sp pacer.  Prolonged hospitalization due to hyponatremia   87 year old female past medical history for hypertension, dyslipidemia, diastolic heart failure, coronary artery disease, hyponatremia, obesity and prediabetes who presented with dyspnea, and difficulty urinating.  Recent hospitalization 10/5-09/2014 for septic shock due to urinary infection.  At home she was being followed by home health, she was noted to have bradycardia down to 33 bpm.  Her carvedilol was discontinued and amlodipine was decreased to 5 mg from 10 mg as an outpatient.  Patient reported lower extremity edema and worsening dyspnea that prompted her to call EMS.  She was found to have a heart rate in the 81X and systolic blood pressure in the 60s, she was transported to the hospital. On her initial physical examination her heart rate was 36-55, respiratory rate 16-24, blood pressure 119/98, oxygen saturation 98%.  Her lungs were clear to auscultation bilaterally, heart S1-S2, present, bradycardic, abdomen soft, no lower extremity edema.   Na 124, K 3,6, CL 91, bicarb 20, glucose 119, BUN 17, cr 1,66.  High sensitive troponin 21-20 Wbc 9,7, hgb 9,8, hct 29,0, Plt 274 SARS COVID 19 negative    Urine analysis with sg 1.011, negative nitrates and leukocytes    Chest film with mild cardiomegaly, hilar vascular congestion with increase interstitial markings bilaterally.    EKG 48 bpm, left axis deviation, right bundle branch block, QTC 531, second degree Mobitz 2, poor R wave progression, no significant ST segment or T wave changes.   Patient received furosemide for diuresis, required noninvasive mechanical ventilation. Patient continued to have  bradycardia, electrophysiology was consulted for complete heart block.   Patient underwent right heart catheterization and temporary pacemaker placement.  She was found and decompensated heart failure due to mitral regurgitation. Transfer to intensive care unit for further heart failure management. She was placed on milrinone for inotropic support.   10/17 patient was liberated from noninvasive mechanical ventilation. 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  No vegetations, mild prolapse of the posterior mitral leaflet, with mild mitral regurgitation.   10/18 patient underwent [employer] dual-chamber pacemaker implantation.   Patient very weak and deconditioned, physical therapy has recommended home health services. Patient continue to be hyponatremic Plan for discharge home with home health services.    Pending improvement of serum Na before discharge.    Assessment & Plan:   Principal Problem:   CHF (congestive heart failure) (HCC) Active Problems:   Hyponatremia   Hypertension   AKI (acute kidney injury) ([place])   Symptomatic bradycardia   Dysuria   Anemia   Acute heart failure with preserved ejection fraction (HFpEF) (HCC)   RBBB (right bundle branch block with left anterior fascicular block)   Hypomagnesemia   Atherosclerosis of both carotid arteries   Acute respiratory distress   Heart block AV complete (HCC)   Acute pulmonary edema (HCC)   Nonrheumatic mitral valve regurgitation   Malnutrition of moderate degree   Acute diastolic heart failure exacerbation, with cardiogenic shock, acute cardiogenic pulmonary edema with acute hypoxemic respiratory failure.  Mitral valve regurgitation. HTN Heart failure exacerbation and respiratory failure have resolved.  Patient today looking hypovolemic, will plan to hold no furosemide for now.  Continue amlodipine for blood pressure control.  Heart  failure management with sacubitril and valsartan.   2. AKI on CKD stage 3a, hyponatremia, hypokalemia and hypomagnesemia.  SIADH Improving Na, today is 129, K is 3,9 and serum bicarbonate at 22, serum cr up to 1,33 and BUN 108.    Plan to hold on furosemide and change urea to Na tablets due to increased BUN. Continue with a regular diet with water restriction. Follow up renal function and electrolytes in am.    3. Complete heart block. Now sp pacer, telemetry personally reviewed with paced rhythm (100%). Will need outpatient follow up.   Blood pressure has been stable.    4. Anemia of chronic disease. Hgb at 10,7 and hct at 31,5    5. Obesity class 2. Depression. Moderate calorie protein malnutrition. Oral thrush.  BMI is 35,12 Continue with duloxetine  Oral pain has improved, continue with lidocaine and nystatin   Continue with prophylactic bactrim.    Status is: Inpatient  Remains inpatient appropriate because: hyponatremia    DVT prophylaxis:   Enoxaparin  Code Status:    full  Family Communication:  No family at the bedside      Nutrition Status: Nutrition Problem: Moderate Malnutrition Etiology: chronic illness (CHF) Signs/Symptoms: mild muscle depletion, percent weight loss (10% weight loss within a month) Percent weight loss: 10 % Interventions: Ensure Enlive (each supplement provides 350kcal and 20 grams of protein), MVI      Consultants:  Cardiology  EP  Procedures:  Pacer      Subjective: Patient with no nausea or vomiting, feeling weak and deconditioned, her oral intake has improved.   Objective: Vitals:   11/09/21 0500 11/09/21 0501 11/09/21 0805 11/09/21 0817  BP:   115/65 115/65  Pulse:   90 90  Resp: (!) 21 19 20 20   Temp:   (!) 97.4 F (36.3 C) (!) 97.4 F (36.3 C)  TempSrc:    Oral  SpO2:    96%  Weight: 72.5 kg     Height:        Intake/Output Summary (Last 24 hours) at 11/09/2021 1430 Last data filed at 11/09/2021 0817 Gross  per 24 hour  Intake 620 ml  Output 2350 ml  Net -1730 ml   Filed Weights   11/06/21 1632 11/07/21 0344 11/09/21 0500  Weight: 74.4 kg 73.6 kg 72.5 kg    Examination:   General:  deconditioned  Neurology: Awake and alert, non focal  E ENT: no pallor, no icterus, oral mucosa moist Cardiovascular: No JVD. S1-S2 present, rhythmic, no gallops, rubs, or murmurs. No lower extremity edema. Pulmonary: positive breath sounds bilaterally, with no wheezing, rhonchi or rales. Gastrointestinal. Abdomen soft and non tender Skin. No rashes Musculoskeletal: no joint deformities     Data Reviewed: I have personally reviewed following labs and imaging studies  CBC: Recent Labs  Lab 11/03/21 0443 11/03/21 1657 11/04/21 1931 11/06/21 0613  WBC 8.3  --   --  6.3  HGB 8.1* 8.7* 8.8* 10.7*  HCT 23.7* 25.0* 26.4* 31.5*  MCV 84.6  --   --  85.1  PLT 279  --   --  998*   Basic Metabolic Panel: Recent Labs  Lab 11/05/21 0418 11/06/21 0613 11/07/21 1016 11/08/21 0243 11/09/21 0847  NA 130* 130* 129* 126* 129*  K 4.5 4.3 4.4 3.9 3.9  CL 96* 92* 94* 92* 95*  CO2 26 27 27 25 22   GLUCOSE 96 97 114* 110* 166*  BUN  18 12 10  36* 108*  CREATININE 1.28* 1.00 1.05* 1.02* 1.31*  CALCIUM 8.9 9.4 9.1 9.1 10.0   GFR: Estimated Creatinine Clearance: 25.4 mL/min (A) (by C-G formula based on SCr of 1.31 mg/dL (H)). Liver Function Tests: No results for input(s): AST, ALT, ALKPHOS, BILITOT, PROT, ALBUMIN in the last 168 hours. No results for input(s): LIPASE, AMYLASE in the last 168 hours. No results for input(s): AMMONIA in the last 168 hours. Coagulation Profile: No results for input(s): INR, PROTIME in the last 168 hours. Cardiac Enzymes: No results for input(s): CKTOTAL, CKMB, CKMBINDEX, TROPONINI in the last 168 hours. BNP (last 3 results) No results for input(s): PROBNP in the last 8760 hours. HbA1C: No results for input(s): HGBA1C in the last 72 hours. CBG: No results for input(s):  GLUCAP in the last 168 hours. Lipid Profile: No results for input(s): CHOL, HDL, LDLCALC, TRIG, CHOLHDL, LDLDIRECT in the last 72 hours. Thyroid Function Tests: No results for input(s): TSH, T4TOTAL, FREET4, T3FREE, THYROIDAB in the last 72 hours. Anemia Panel: No results for input(s): VITAMINB12, FOLATE, FERRITIN, TIBC, IRON, RETICCTPCT in the last 72 hours.    Radiology Studies: I have reviewed all of the imaging during this hospital visit personally     Scheduled Meds:  amLODipine  5 mg Oral Daily   DULoxetine  20 mg Oral Daily   enoxaparin (LOVENOX) injection  30 mg Subcutaneous Q24H   feeding supplement  237 mL Oral BID BM   gabapentin  100 mg Oral QHS   magnesium oxide  400 mg Oral BID   mouth rinse  15 mL Mouth Rinse BID   multivitamin with minerals  1 tablet Oral Daily   nystatin  5 mL Oral QID   pantoprazole  40 mg Oral Daily   Ensure Max Protein  11 oz Oral BID   sacubitril-valsartan  1 tablet Oral BID   tamsulosin  0.4 mg Oral QPC supper   trimethoprim  100 mg Oral Daily   urea  30 g Oral QID   Continuous Infusions:   LOS: 9 days        Milton Sagona Gerome Apley, MD

## 2021-11-10 DIAGNOSIS — N1831 Chronic kidney disease, stage 3a: Secondary | ICD-10-CM

## 2021-11-10 DIAGNOSIS — I340 Nonrheumatic mitral (valve) insufficiency: Secondary | ICD-10-CM

## 2021-11-10 DIAGNOSIS — N171 Acute kidney failure with acute cortical necrosis: Secondary | ICD-10-CM

## 2021-11-10 DIAGNOSIS — E440 Moderate protein-calorie malnutrition: Secondary | ICD-10-CM

## 2021-11-10 LAB — BASIC METABOLIC PANEL
Anion gap: 9 (ref 5–15)
BUN: 108 mg/dL — ABNORMAL HIGH (ref 8–23)
CO2: 27 mmol/L (ref 22–32)
Calcium: 9.8 mg/dL (ref 8.9–10.3)
Chloride: 97 mmol/L — ABNORMAL LOW (ref 98–111)
Creatinine, Ser: 1.41 mg/dL — ABNORMAL HIGH (ref 0.44–1.00)
GFR, Estimated: 36 mL/min — ABNORMAL LOW (ref >60)
Glucose, Bld: 109 mg/dL — ABNORMAL HIGH (ref 70–99)
Potassium: 3.8 mmol/L (ref 3.5–5.1)
Sodium: 133 mmol/L — ABNORMAL LOW (ref 135–145)

## 2021-11-10 MED ORDER — COVID-19MRNA BIVAL VACC PFIZER 30 MCG/0.3ML IM SUSP
0.3000 mL | Freq: Once | INTRAMUSCULAR | Status: DC
  Filled 2021-11-10 (×3): qty 0.3

## 2021-11-10 NOTE — NC FL2 (Addendum)
[place] LEVEL OF CARE SCREENING TOOL     IDENTIFICATION  Patient Name: Deborah Jordan Birthdate: Mar 06, 1933 Sex: female Admission Date (Current Location): 10/31/2021  [facility] and [place] Number:  Herbalist and Address:  [facility], [place] [address] [place], [place] [address]      Provider Number: [id number]  Attending Physician Name and Address:  Allie Bossier, MD  Relative Name and Phone Number:  Judieth Keens    Current Level of Care: Hospital Recommended Level of Care: [place] Prior Approval Number:    Date Approved/Denied:   PASRR Number: [id number]  Discharge Plan: Other (Comment) ALF    Current Diagnoses: Patient Active Problem List   Diagnosis Date Noted   Malnutrition of moderate degree 11/09/2021   Encounter for care of pacemaker 11/06/2021   Pacemaker Abbott dual chamber PACEMAKER ASSURITY DR-RF 11/06/2021   Acute respiratory distress    Heart block AV complete ([place])    Acute pulmonary edema (HCC)    Nonrheumatic mitral valve regurgitation    CHF (congestive heart failure) ([place]) 10/31/2021   Symptomatic bradycardia 10/31/2021   Dysuria 10/31/2021   Anemia 10/31/2021   Acute heart failure with preserved ejection fraction (HFpEF) (HCC)    RBBB (right bundle branch block with left anterior fascicular block)    Hypomagnesemia    Atherosclerosis of both carotid arteries    Bacteremia    Septic shock ([place]) 09/22/2021   AKI (acute kidney injury) ([place])    Hypokalemia    Transaminitis    Dyspnea and respiratory abnormalities    Chronic bilateral low back pain with bilateral sciatica 09/27/2017   Bilateral chronic knee pain 03/13/2017   Pleural effusion 03/10/2013   Hyponatremia 03/10/2013   Hypertension 03/10/2013   Degenerative arthritis of hip 03/08/2013    Orientation RESPIRATION BLADDER Height & Weight     Self, Time, Situation, Place  Normal (Regular Diet) Continent Weight: 72.5  kg Height:  4\' 9"  (144.8 cm)  BEHAVIORAL SYMPTOMS/MOOD NEUROLOGICAL BOWEL NUTRITION STATUS      Continent Diet regular  AMBULATORY STATUS COMMUNICATION OF NEEDS Skin   Limited Assist Verbally Normal                       Personal Care Assistance Level of Assistance  Bathing, Feeding, Dressing Bathing Assistance: Limited assistance Feeding assistance: Independent Dressing Assistance: Limited assistance     Functional Limitations Info  Sight, Speech, Hearing Sight Info: Adequate Hearing Info: Impaired (slightly) Speech Info: Adequate    SPECIAL CARE FACTORS FREQUENCY  OT (By licensed OT), PT (By licensed PT)     PT Frequency: 3x per week OT Frequency: 3x per week            Contractures Contractures Info: Not present    Additional Factors Info  Code Status, Allergies Code Status Info: Full Code Allergies Info: Ace Inhibitor, Hydrochlorothiazide           Current Medications (11/10/2021):  This is the current hospital active medication list Current Facility-Administered Medications  Medication Dose Route Frequency Provider Last Rate Last Admin   acetaminophen (TYLENOL) tablet 650 mg  650 mg Oral Q4H PRN Adrian Prows, MD   650 mg at 11/09/21 1733   albuterol (PROVENTIL) (2.5 MG/3ML) 0.083% nebulizer solution 2.5 mg  2.5 mg Nebulization Q6H PRN Adrian Prows, MD   2.5 mg at 11/01/21 0650   amLODipine (NORVASC) tablet 5 mg  5 mg Oral Daily  Patwardhan, Manish J, MD   5 mg at 11/10/21 0800   COVID-19 mRNA bivalent vaccine (Pfizer) injection 0.3 mL  0.3 mL Intramuscular Once Allie Bossier, MD       DULoxetine (CYMBALTA) DR capsule 20 mg  20 mg Oral Daily Adrian Prows, MD   20 mg at 11/10/21 0801   enoxaparin (LOVENOX) injection 30 mg  30 mg Subcutaneous Q24H Adrian Prows, MD   30 mg at 11/09/21 1653   feeding supplement (ENSURE ENLIVE / ENSURE PLUS) liquid 237 mL  237 mL Oral BID BM Arrien, Jimmy Picket, MD   237 mL at 11/10/21 1232   gabapentin (NEURONTIN) capsule 100 mg   100 mg Oral QHS Adrian Prows, MD   100 mg at 11/09/21 2103   lidocaine (XYLOCAINE) 2 % viscous mouth solution 15 mL  15 mL Mouth/Throat Q4H PRN Arrien, Jimmy Picket, MD       magnesium oxide (MAG-OX) tablet 400 mg  400 mg Oral BID Adrian Prows, MD   400 mg at 11/10/21 0800   MEDLINE mouth rinse  15 mL Mouth Rinse BID Adrian Prows, MD   15 mL at 11/10/21 0803   melatonin tablet 5 mg  5 mg Oral QHS PRN Adrian Prows, MD       multivitamin with minerals tablet 1 tablet  1 tablet Oral Daily Arrien, Jimmy Picket, MD   1 tablet at 11/10/21 0802   nystatin (MYCOSTATIN) 100000 UNIT/ML suspension 500,000 Units  5 mL Oral QID Tawni Millers, MD   500,000 Units at 11/10/21 1233   pantoprazole (PROTONIX) EC tablet 40 mg  40 mg Oral Daily Adrian Prows, MD   40 mg at 11/10/21 0801   protein supplement (ENSURE MAX) liquid  11 oz Oral BID Tawni Millers, MD   11 oz at 11/09/21 2104   sacubitril-valsartan (ENTRESTO) 24-26 mg per tablet  1 tablet Oral BID Nigel Mormon, MD   1 tablet at 11/10/21 0801   sodium chloride tablet 2 g  2 g Oral TID WC Arrien, Jimmy Picket, MD   2 g at 11/10/21 1233   tamsulosin (FLOMAX) capsule 0.4 mg  0.4 mg Oral QPC supper Adrian Prows, MD   0.4 mg at 11/09/21 1653     Discharge Medications: Please see discharge summary for a list of discharge medications.  Relevant Imaging Results:  Relevant Lab Results:   Additional Information SSN: [id number]. Moderna COVID-19 Vaccine 03/19/2020  Erenest Rasher, RN

## 2021-11-10 NOTE — Care Management Important Message (Signed)
Important Message  Patient Details  Name: Deborah Jordan MRN: [id number] Date of Birth: 27-Apr-1933   Medicare Important Message Given:  Yes     [name] 11/10/2021, 11:19 AM

## 2021-11-10 NOTE — Progress Notes (Signed)
Mobility Specialist: Progress Note   11/10/21 1811  Mobility  Activity Ambulated in hall  Level of Assistance Contact guard assist, steadying assist  Assistive Device Front wheel walker  Distance Ambulated (ft) 100 ft  Mobility Ambulated with assistance in hallway  Mobility Response Tolerated well  Mobility performed by Mobility specialist  $Mobility charge 1 Mobility   Pt stopped x2 for brief standing breaks d/t fatigue. Pt back to bed after walk with call bell and phone at her side. Bed alarm is on.   [facility] Jasiya Markie Mobility Specialist Mobility Specialist Phone #1: [phone] Mobility Specialist Phone #2: [phone]

## 2021-11-10 NOTE — Progress Notes (Signed)
PROGRESS NOTE    Deborah Jordan  PHX:[id number] DOB: 03-25-1933 DOA: 10/31/2021 PCP: Deborah Jordan, MD   Brief Narrative:  Deborah Jordan was admitted to the hospital with the working diagnosis of acute diastolic heart failure exacerbation, complicated with complete heart block. Now sp pacer.  Prolonged hospitalization due to hyponatremia   87 year old female past medical history for hypertension, dyslipidemia, diastolic heart failure, coronary artery disease, hyponatremia, obesity and prediabetes who presented with dyspnea, and difficulty urinating.  Recent hospitalization 10/5-09/2014 for septic shock due to urinary infection.  At home she was being followed by home health, she was noted to have bradycardia down to 33 bpm.  Her carvedilol was discontinued and amlodipine was decreased to 5 mg from 10 mg as an outpatient.  Patient reported lower extremity edema and worsening dyspnea that prompted her to call EMS.  She was found to have a heart rate in the 01K and systolic blood pressure in the 60s, she was transported to the hospital. On her initial physical examination her heart rate was 36-55, respiratory rate 16-24, blood pressure 119/98, oxygen saturation 98%.  Her lungs were clear to auscultation bilaterally, heart S1-S2, present, bradycardic, abdomen soft, no lower extremity edema.   Na 124, K 3,6, CL 91, bicarb 20, glucose 119, BUN 17, cr 1,66.  High sensitive troponin 21-20 Wbc 9,7, hgb 9,8, hct 29,0, Plt 274 SARS COVID 19 negative    Urine analysis with sg 1.011, negative nitrates and leukocytes    Chest film with mild cardiomegaly, hilar vascular congestion with increase interstitial markings bilaterally.    EKG 48 bpm, left axis deviation, right bundle branch block, QTC 531, second degree Mobitz 2, poor R wave progression, no significant ST segment or T wave changes.   Patient received furosemide for diuresis, required noninvasive mechanical ventilation. Patient continued to have  bradycardia, electrophysiology was consulted for complete heart block.   Patient underwent right heart catheterization and temporary pacemaker placement.  She was found and decompensated heart failure due to mitral regurgitation. Transfer to intensive care unit for further heart failure management. She was placed on milrinone for inotropic support.   10/17 patient was liberated from noninvasive mechanical ventilation. 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  No vegetations, mild prolapse of the posterior mitral leaflet, with mild mitral regurgitation.   10/18 patient underwent Saint Jude dual-chamber pacemaker implantation.   Patient very weak and deconditioned, physical therapy has recommended home health services. Patient continue to be hyponatremic Plan for discharge home with home health services.    Pending improvement of serum Na before discharge.    Subjective: A/O x4, negative CP, negative SOB.  States did not feel all that great.  Still having pain from oral thrush.   Assessment & Plan:  Covid vaccination; vaccinated 1/3; requests next vaccination.  11/23 placed consult for Bivalent vaccination  Principal Problem:   CHF (congestive heart failure) ([place]) Active Problems:   Hyponatremia   Hypertension   AKI (acute kidney injury) ([place])   Symptomatic bradycardia   Dysuria   Anemia   Acute heart failure with preserved ejection fraction (HFpEF) (HCC)   RBBB (right bundle branch block with left anterior fascicular block)   Hypomagnesemia   Atherosclerosis of both carotid arteries   Acute respiratory distress   Heart block AV complete (HCC)   Acute pulmonary edema (HCC)   Nonrheumatic mitral valve regurgitation   Malnutrition of moderate degree   Acute renal failure with acute renal cortical  necrosis superimposed on stage 3a chronic kidney disease ([place])  Acute diastolic CHF with  exacerbation/cardiogenic shock -Multifactorial, CHF exacerbation, pulmonary edema, MV regurgitation.  Treat underlying causes -11/23 resolved -Strict in and out - Daily weight - Amlodipine 5 mg daily -Entresto 24-26 mg 1 tablet BID  Complete heart block - S/p pacer   HTN - See CHF  Acute pulmonary respiratory failure with hypoxia -11/23 resolved patient on room air sitting in chair comfortably    Acute on CKD stage 3a, (baseline Cr 1.0)   -11/23 patient's renal functions continues to worsen. -Per patient no heart catheterization.  We will review medicine list for offending agents. - 11/23 DC Trimethoprim -Hold all nephrotoxic medication Lab Results  Component Value Date   CREATININE 1.41 (H) 11/10/2021   CREATININE 1.31 (H) 11/09/2021   CREATININE 1.02 (H) 11/08/2021   CREATININE 1.05 (H) 11/07/2021   CREATININE 1.00 11/06/2021    SIADH - Fluid restriction 12101ml/day -Short course of NaCl tablets 2 g TID - Hold furosemide  Anemia of chronic disease? - Occult blood pending - Anemia panel pending  Depression.    Moderate calorie protein malnutrition.  Oral thrush.  -Nystatin rinse QID  Obesity class 2.  (BMI 34.59 kg/m)     DVT prophylaxis: Lovenox Code Status: Full Family Communication:  Status is: Inpatient    Dispo: The patient is from: Home              Anticipated d/c is to: Home              Anticipated d/c date is: 2 days              Patient currently is not medically stable to d/c.      Consultants:  Cardiology EP  Procedures/Significant Events:     I have personally reviewed and interpreted all radiology studies and my findings are as above.  VENTILATOR SETTINGS:    Cultures   Antimicrobials:    Devices    LINES / TUBES:      Continuous Infusions:   Objective: Vitals:   11/10/21 0409 11/10/21 0800 11/10/21 1148 11/10/21 1631  BP: (!) 134/43 (!) 109/56 (!) 127/52 (!) 123/59  Pulse: 80 79 78 81  Resp: 20  20 20 13   Temp: 98.6 F (37 C) 98.2 F (36.8 C) 97.6 F (36.4 C) 97.7 F (36.5 C)  TempSrc: Oral Oral Axillary Oral  SpO2: 99% 98% 100% 99%  Weight: 72.5 kg     Height:        Intake/Output Summary (Last 24 hours) at 11/10/2021 1900 Last data filed at 11/10/2021 1739 Gross per 24 hour  Intake 100 ml  Output 1900 ml  Net -1800 ml   Filed Weights   11/07/21 0344 11/09/21 0500 11/10/21 0409  Weight: 73.6 kg 72.5 kg 72.5 kg    Examination:  General: A/O x4 No acute respiratory distress Eyes: negative scleral hemorrhage, negative anisocoria, negative icterus ENT: Negative Runny nose, negative gingival bleeding, Neck:  Negative scars, masses, torticollis, lymphadenopathy, JVD Lungs: Clear to auscultation bilaterally without wheezes or crackles Cardiovascular: Regular rate and rhythm without murmur gallop or rub normal S1 and S2 Abdomen: negative abdominal pain, nondistended, positive soft, bowel sounds, no rebound, no ascites, no appreciable mass Extremities: No significant cyanosis, clubbing, or edema bilateral lower extremities Skin: Negative rashes, lesions, ulcers Psychiatric:  Negative depression, negative anxiety, negative fatigue, negative mania  Central nervous system:  Cranial nerves II through XII intact, tongue/uvula midline, all extremities muscle  strength 5/5, sensation intact throughout, negative dysarthria, negative expressive aphasia, negative receptive aphasia.  .     Data Reviewed: Care during the described time interval was provided by me .  I have reviewed this patient's available data, including medical history, events of note, physical examination, and all test results as part of my evaluation.   CBC: Recent Labs  Lab 11/04/21 1931 11/06/21 0613  WBC  --  6.3  HGB 8.8* 10.7*  HCT 26.4* 31.5*  MCV  --  85.1  PLT  --  517*   Basic Metabolic Panel: Recent Labs  Lab 11/06/21 0613 11/07/21 1016 11/08/21 0243 11/09/21 0847 11/10/21 0058  NA  130* 129* 126* 129* 133*  K 4.3 4.4 3.9 3.9 3.8  CL 92* 94* 92* 95* 97*  CO2 27 27 25 22 27   GLUCOSE 97 114* 110* 166* 109*  BUN 12 10 36* 108* 108*  CREATININE 1.00 1.05* 1.02* 1.31* 1.41*  CALCIUM 9.4 9.1 9.1 10.0 9.8   GFR: Estimated Creatinine Clearance: 23.6 mL/min (A) (by C-G formula based on SCr of 1.41 mg/dL (H)). Liver Function Tests: No results for input(s): AST, ALT, ALKPHOS, BILITOT, PROT, ALBUMIN in the last 168 hours. No results for input(s): LIPASE, AMYLASE in the last 168 hours. No results for input(s): AMMONIA in the last 168 hours. Coagulation Profile: No results for input(s): INR, PROTIME in the last 168 hours. Cardiac Enzymes: No results for input(s): CKTOTAL, CKMB, CKMBINDEX, TROPONINI in the last 168 hours. BNP (last 3 results) No results for input(s): PROBNP in the last 8760 hours. HbA1C: No results for input(s): HGBA1C in the last 72 hours. CBG: No results for input(s): GLUCAP in the last 168 hours. Lipid Profile: No results for input(s): CHOL, HDL, LDLCALC, TRIG, CHOLHDL, LDLDIRECT in the last 72 hours. Thyroid Function Tests: No results for input(s): TSH, T4TOTAL, FREET4, T3FREE, THYROIDAB in the last 72 hours. Anemia Panel: No results for input(s): VITAMINB12, FOLATE, FERRITIN, TIBC, IRON, RETICCTPCT in the last 72 hours. Urine analysis:    Component Value Date/Time   COLORURINE YELLOW 11/01/2021 0321   APPEARANCEUR HAZY (A) 11/01/2021 0321   LABSPEC 1.011 11/01/2021 0321   PHURINE 5.0 11/01/2021 0321   GLUCOSEU NEGATIVE 11/01/2021 0321   HGBUR NEGATIVE 11/01/2021 0321   BILIRUBINUR NEGATIVE 11/01/2021 0321   KETONESUR NEGATIVE 11/01/2021 0321   PROTEINUR 30 (A) 11/01/2021 0321   UROBILINOGEN 0.2 03/08/2013 0916   NITRITE NEGATIVE 11/01/2021 0321   LEUKOCYTESUR NEGATIVE 11/01/2021 0321   Sepsis Labs: @LABRCNTIP (procalcitonin:4,lacticidven:4)  ) Recent Results (from the past 240 hour(s))  Culture, blood (Routine X 2) w Reflex to ID Panel      Status: None   Collection Time: 11/02/21 11:39 AM   Specimen: BLOOD RIGHT HAND  Result Value Ref Range Status   Specimen Description BLOOD RIGHT HAND  Final   Special Requests   Final    BOTTLES DRAWN AEROBIC ONLY Blood Culture results may not be optimal due to an inadequate volume of blood received in culture bottles   Culture   Final    NO GROWTH 5 DAYS Performed at [facility], [place] [address] [place],  [address]    Report Status 11/07/2021 FINAL  Final  Culture, blood (Routine X 2) w Reflex to ID Panel     Status: None   Collection Time: 11/02/21 11:40 AM   Specimen: BLOOD LEFT HAND  Result Value Ref Range Status   Specimen Description BLOOD LEFT HAND  Final   Special  Requests   Final    BOTTLES DRAWN AEROBIC ONLY Blood Culture results may not be optimal due to an inadequate volume of blood received in culture bottles   Culture   Final    NO GROWTH 5 DAYS Performed at [facility], [place] [address], [place], [place] [address]    Report Status 11/07/2021 FINAL  Final  Surgical PCR screen     Status: None   Collection Time: 11/04/21  9:23 PM   Specimen: Nasal Mucosa; Nasal Swab  Result Value Ref Range Status   MRSA, PCR NEGATIVE NEGATIVE Final   Staphylococcus aureus NEGATIVE NEGATIVE Final    Comment: (NOTE) The Xpert SA Assay (FDA approved for NASAL specimens in patients 67 years of age and older), is one component of a comprehensive surveillance program. It is not intended to diagnose infection nor to guide or monitor treatment. Performed at [facility], [place] [address], [place], [place] [address]   Resp Panel by RT-PCR (Flu A&B, Covid) Nasopharyngeal Swab     Status: None   Collection Time: 11/05/21  1:51 PM   Specimen: Nasopharyngeal Swab; Nasopharyngeal(NP) swabs in vial transport medium  Result Value Ref Range Status   SARS Coronavirus 2 by RT PCR NEGATIVE NEGATIVE Final    Comment: (NOTE) SARS-CoV-2 target nucleic acids are NOT  DETECTED.  The SARS-CoV-2 RNA is generally detectable in upper respiratory specimens during the acute phase of infection. The lowest concentration of SARS-CoV-2 viral copies this assay can detect is 138 copies/mL. A negative result does not preclude SARS-Cov-2 infection and should not be used as the sole basis for treatment or other patient management decisions. A negative result may occur with  improper specimen collection/handling, submission of specimen other than nasopharyngeal swab, presence of viral mutation(s) within the areas targeted by this assay, and inadequate number of viral copies(<138 copies/mL). A negative result must be combined with clinical observations, patient history, and epidemiological information. The expected result is Negative.  Fact Sheet for Patients:  EntrepreneurPulse.com.au  Fact Sheet for Healthcare Providers:  IncredibleEmployment.be  This test is no t yet approved or cleared by the [place] FDA and  has been authorized for detection and/or diagnosis of SARS-CoV-2 by FDA under an Emergency Use Authorization (EUA). This EUA will remain  in effect (meaning this test can be used) for the duration of the COVID-19 declaration under Section 564(b)(1) of the Act, 21 U.S.C.section 360bbb-3(b)(1), unless the authorization is terminated  or revoked sooner.       Influenza A by PCR NEGATIVE NEGATIVE Final   Influenza B by PCR NEGATIVE NEGATIVE Final    Comment: (NOTE) The Xpert Xpress SARS-CoV-2/FLU/RSV plus assay is intended as an aid in the diagnosis of influenza from Nasopharyngeal swab specimens and should not be used as a sole basis for treatment. Nasal washings and aspirates are unacceptable for Xpert Xpress SARS-CoV-2/FLU/RSV testing.  Fact Sheet for Patients: EntrepreneurPulse.com.au  Fact Sheet for Healthcare Providers: IncredibleEmployment.be  This test is not yet  approved or cleared by the [place] FDA and has been authorized for detection and/or diagnosis of SARS-CoV-2 by FDA under an Emergency Use Authorization (EUA). This EUA will remain in effect (meaning this test can be used) for the duration of the COVID-19 declaration under Section 564(b)(1) of the Act, 21 U.S.C. section 360bbb-3(b)(1), unless the authorization is terminated or revoked.  Performed at [facility], [place] [address] [place], [place] [address]  Radiology Studies: No results found.      Scheduled Meds:  amLODipine  5 mg Oral Daily   COVID-19 mRNA bivalent vaccine (Pfizer)  0.3 mL Intramuscular Once   DULoxetine  20 mg Oral Daily   enoxaparin (LOVENOX) injection  30 mg Subcutaneous Q24H   feeding supplement  237 mL Oral BID BM   gabapentin  100 mg Oral QHS   magnesium oxide  400 mg Oral BID   mouth rinse  15 mL Mouth Rinse BID   multivitamin with minerals  1 tablet Oral Daily   nystatin  5 mL Oral QID   pantoprazole  40 mg Oral Daily   Ensure Max Protein  11 oz Oral BID   sacubitril-valsartan  1 tablet Oral BID   sodium chloride  2 g Oral TID WC   tamsulosin  0.4 mg Oral QPC supper   Continuous Infusions:   LOS: 10 days   The patient is critically ill with multiple organ systems failure and requires high complexity decision making for assessment and support, frequent evaluation and titration of therapies, application of advanced monitoring technologies and extensive interpretation of multiple databases. Critical Care Time devoted to patient care services described in this note  Time spent: 40 minutes     Ellianah Cordy, Geraldo Docker, MD [facility]   If 7PM-7AM, please contact night-coverage 11/10/2021, 7:00 PM

## 2021-11-10 NOTE — Progress Notes (Signed)
Bladder scan pt 396ml pt stated she feels  uncomfortable due to bladder pain. MD notified and in and out cath performed. In and out 568ml  Phoebe Sharps, RN

## 2021-11-10 NOTE — Progress Notes (Signed)
Physical Therapy Treatment Patient Details Name: Deborah Jordan MRN: [id number] DOB: 02-15-1933 Today's Date: 11/10/2021   History of Present Illness Pt is an 87 y.o. female admitted 10/31/21 with SOB, bradycardia. Workup for acute CHF exacerbation, severe MR. S/p TEE with PPM placement on 11/17. PMH includes CHF, HTN, arthritis, sciatica, CAD, CKD 4; of note, recent admission for sepsis requiring intubation.   PT Comments    Pt progressing with mobility. Today's session focused on gait training with rollator, as well as activity for increasing endurance and strength; pt denies dizziness this session, endorses fatigue and feeling poorly. Pt remains limited by generalized weakness, decreased activity tolerance, poor balance strategies/postural reactions. Pt reports family still looking into ALF options. Will continue to follow acutely to address established goals.    Recommendations for follow up therapy are one component of a multi-disciplinary discharge planning process, led by the attending physician.  Recommendations may be updated based on patient status, additional functional criteria and insurance authorization.  Follow Up Recommendations  Home health PT     Assistance Recommended at Discharge Intermittent Supervision/Assistance  Equipment Recommendations  None recommended by PT    Recommendations for Other Services       Precautions / Restrictions Precautions Precautions: Fall;ICD/Pacemaker Restrictions Weight Bearing Restrictions: No     Mobility  Bed Mobility Overal bed mobility: Modified Independent Bed Mobility: Supine to Sit           General bed mobility comments: HOB elevated, use of rail    Transfers Overall transfer level: Needs assistance Equipment used: 1 person hand held assist;Rollator (4 wheels) Transfers: Sit to/from Stand;Bed to chair/wheelchair/BSC Sit to Stand: Supervision     Step pivot transfers: Min assist     General transfer comment:  Initial step pivot to recliner with minA for HHA to stabilize; multiple additional sit<>stands from recliner and rollator seat with supervision, intermittent cues for locking/unlocking rollator brakes    Ambulation/Gait Ambulation/Gait assistance: Min guard Gait Distance (Feet): 36 Feet (+ 58' + 94') Assistive device: Rollator (4 wheels) Gait Pattern/deviations: Step-through pattern;Decreased stride length;Trunk flexed Gait velocity: Decreased     General Gait Details: Slow, mostly steady gait with rollator and intermittent min guard for balance; flexed forward posture with forearms resting against rollator armrests, cues to correct and maintain upright posture which pt able to maintain; 2x prolonged seated rest breaks secondary to fatigue, cues for safety with rollator (brakes, backing against wall/counter for added stability)   Stairs             Wheelchair Mobility    Modified Rankin (Stroke Patients Only)       Balance Overall balance assessment: Needs assistance Sitting-balance support: No upper extremity supported Sitting balance-Leahy Scale: Good     Standing balance support: Single extremity supported;During functional activity Standing balance-Leahy Scale: Fair Standing balance comment: can static stand on scale without UE support, reliant on UE support for dynamic standing tasks and ambulation                            Cognition Arousal/Alertness: Awake/alert Behavior During Therapy: Flat affect Overall Cognitive Status: No family/caregiver present to determine baseline cognitive functioning                                 General Comments: Suspect short-term memory deficits and decreased insight into functional mobility deficits; initial poor safety awareness with  rollator brakes, but demonstates good carryover after education with intermittent cues        Exercises      General Comments General comments (skin integrity,  edema, etc.): Pt reports generalized fatigue this session, hopeful for d/c home soon; when asked how pt will manage like this at home, pt reports, "I don't know... they're looking into an assisted living home for me but it's taking a while." Educ on recommendation for initial increased assist from family; pt reports daughter cooks for her every night, pt primarily "lives in my room" where bathroom is close (also has fridge in there)      Pertinent Vitals/Pain Pain Assessment: Faces Faces Pain Scale: Hurts a little bit Pain Location: Generalized Pain Descriptors / Indicators: Tiring Pain Intervention(s): Monitored during session;Limited activity within patient's tolerance    Home Living                          Prior Function            PT Goals (current goals can now be found in the care plan section) Progress towards PT goals: Progressing toward goals    Frequency    Min 3X/week      PT Plan Current plan remains appropriate    Co-evaluation              AM-PAC PT "6 Clicks" Mobility   Outcome Measure  Help needed turning from your back to your side while in a flat bed without using bedrails?: None Help needed moving from lying on your back to sitting on the side of a flat bed without using bedrails?: A Little Help needed moving to and from a bed to a chair (including a wheelchair)?: A Little Help needed standing up from a chair using your arms (e.g., wheelchair or bedside chair)?: A Little Help needed to walk in hospital room?: A Little Help needed climbing 3-5 steps with a railing? : A Lot 6 Click Score: 18    End of Session Equipment Utilized During Treatment: Gait belt Activity Tolerance: Patient tolerated treatment well;Patient limited by fatigue Patient left: in chair;with call bell/phone within reach;with chair alarm set Nurse Communication: Mobility status PT Visit Diagnosis: Unsteadiness on feet (R26.81);Other abnormalities of gait and mobility  (R26.89);Muscle weakness (generalized) (M62.81)     Time: 6195-0932 PT Time Calculation (min) (ACUTE ONLY): 24 min  Charges:  $Gait Training: 8-22 mins $Therapeutic Exercise: 8-22 mins                     Mabeline Caras, PT, DPT Acute Rehabilitation Services  Pager [phone] Office [place] 11/10/2021, 12:19 PM

## 2021-11-10 NOTE — TOC Initial Note (Signed)
Transition of Care [facility]) - Initial/Assessment Note    Patient Details  Name: Deborah Jordan MRN: [id number] Date of Birth: Aug 30, 1933  Transition of [facility]) CM/SW Contact:    Erenest Rasher, RN Phone Number: [phone] 11/10/2021, 2:47 PM  Clinical Narrative:                 HF TOC CM spoke to dtr, [facility] via phone. States [facility] will complete initial assessment with pt at bedside. Dtr states pt had rehab at [facility] and would like for her to have SNF rehab at a different facility prior to dc to ALF. Explained PT recommended HH PT/OT. Pt has RW at home. Dtr gave permission to create FL2 and fax to ALF. Pt will need TB test and COVID test prior to dc to ALF.   Expected Discharge Plan: Assisted Living Barriers to Discharge: Continued Medical Work up   Patient Goals and CMS Choice Patient states their goals for this hospitalization and ongoing recovery are:: wants patient to be stronger CMS Medicare.gov Compare Post Acute Care list provided to:: Patient Represenative (must comment) Choice offered to / list presented to : Adult Children  Expected Discharge Plan and Services Expected Discharge Plan: Assisted Living   Discharge Planning Services: CM Consult   Living arrangements for the past 2 months: Assisted Living Facility   Prior Living Arrangements/Services Living arrangements for the past 2 months: [place] Lives with:: Self, Adult Children (lives with daughter) Patient language and need for interpreter reviewed:: Yes        Need for Family Participation in Patient Care: Yes (Comment) Care giver support system in place?: Yes (comment) Current home services: DME (Patient has DME RW- [facility] with [facility] RN-PT services)    Activities of Daily Living Home Assistive Devices/Equipment: Environmental consultant (specify type) ADL Screening (condition at time of admission) Patient's cognitive ability adequate to safely complete daily activities?: Yes Is the  patient deaf or have difficulty hearing?: No Does the patient have difficulty seeing, even when wearing glasses/contacts?: No Does the patient have difficulty concentrating, remembering, or making decisions?: No Patient able to express need for assistance with ADLs?: Yes Does the patient have difficulty dressing or bathing?: No Independently performs ADLs?: No Communication: Independent Dressing (OT): Independent Grooming: Independent Feeding: Independent Bathing: Needs assistance Is this a change from baseline?: Pre-admission baseline Toileting: Independent with device (comment) In/Out Bed: Needs assistance Is this a change from baseline?: Pre-admission baseline Walks in Home: Independent with device (comment) Does the patient have difficulty walking or climbing stairs?: Yes Weakness of Legs: Both Weakness of Arms/Hands: None  Permission Sought/Granted         Permission granted to share info w AGENCY: [facility]        Emotional Assessment Appearance:: Appears stated age       Alcohol / Substance Use: Not Applicable Psych Involvement: No (comment)  Admission diagnosis:  CHF (congestive heart failure) ([place]) [I50.9] Patient Active Problem List   Diagnosis Date Noted   Malnutrition of moderate degree 11/09/2021   Encounter for care of pacemaker 11/06/2021   Pacemaker Abbott dual chamber PACEMAKER ASSURITY DR-RF 11/06/2021   Acute respiratory distress    Heart block AV complete ([place])    Acute pulmonary edema ([place])    Nonrheumatic mitral valve regurgitation    CHF (congestive heart failure) ([place]) 10/31/2021   Symptomatic bradycardia 10/31/2021   Dysuria 10/31/2021   Anemia 10/31/2021   Acute heart failure with preserved ejection fraction (HFpEF) ([place])  RBBB (right bundle branch block with left anterior fascicular block)    Hypomagnesemia    Atherosclerosis of both carotid arteries    Bacteremia    Septic shock ([place]) 09/22/2021   AKI (acute kidney injury) ([place])     Hypokalemia    Transaminitis    Dyspnea and respiratory abnormalities    Chronic bilateral low back pain with bilateral sciatica 09/27/2017   Bilateral chronic knee pain 03/13/2017   Pleural effusion 03/10/2013   Hyponatremia 03/10/2013   Hypertension 03/10/2013   Degenerative arthritis of hip 03/08/2013   PCP:  [name], MD Pharmacy:   [facility] [place], [place] - [address] [place] [address] [place] [address] [place] [address] Phone: [phone] Fax: [phone]     Social Determinants of Health (SDOH) Interventions    Readmission Risk Interventions Readmission Risk Prevention Plan 11/02/2021  Transportation Screening Complete  Medication Review Press photographer) Complete  PCP or Specialist appointment within 3-5 days of discharge Complete  HRI or [place] Complete  SW Recovery Care/Counseling Consult Complete  Palliative Care Screening Not [place] Not Applicable  Some recent data might be hidden

## 2021-11-11 DIAGNOSIS — R0602 Shortness of breath: Secondary | ICD-10-CM

## 2021-11-11 LAB — CBC WITH DIFFERENTIAL/PLATELET
Abs Immature Granulocytes: 0.04 10*3/uL (ref 0.00–0.07)
Basophils Absolute: 0.1 10*3/uL (ref 0.0–0.1)
Basophils Relative: 1 %
Eosinophils Absolute: 0.2 10*3/uL (ref 0.0–0.5)
Eosinophils Relative: 3 %
HCT: 29.2 % — ABNORMAL LOW (ref 36.0–46.0)
Hemoglobin: 9.9 g/dL — ABNORMAL LOW (ref 12.0–15.0)
Immature Granulocytes: 1 %
Lymphocytes Relative: 34 %
Lymphs Abs: 2.1 10*3/uL (ref 0.7–4.0)
MCH: 28.9 pg (ref 26.0–34.0)
MCHC: 33.9 g/dL (ref 30.0–36.0)
MCV: 85.4 fL (ref 80.0–100.0)
Monocytes Absolute: 1 10*3/uL (ref 0.1–1.0)
Monocytes Relative: 16 %
Neutro Abs: 2.8 10*3/uL (ref 1.7–7.7)
Neutrophils Relative %: 45 %
Platelets: 593 10*3/uL — ABNORMAL HIGH (ref 150–400)
RBC: 3.42 MIL/uL — ABNORMAL LOW (ref 3.87–5.11)
RDW: 14.3 % (ref 11.5–15.5)
WBC: 6.2 10*3/uL (ref 4.0–10.5)
nRBC: 0 % (ref 0.0–0.2)

## 2021-11-11 LAB — COMPREHENSIVE METABOLIC PANEL
ALT: 14 U/L (ref 0–44)
AST: 25 U/L (ref 15–41)
Albumin: 3.1 g/dL — ABNORMAL LOW (ref 3.5–5.0)
Alkaline Phosphatase: 76 U/L (ref 38–126)
Anion gap: 7 (ref 5–15)
BUN: 66 mg/dL — ABNORMAL HIGH (ref 8–23)
CO2: 27 mmol/L (ref 22–32)
Calcium: 9.7 mg/dL (ref 8.9–10.3)
Chloride: 103 mmol/L (ref 98–111)
Creatinine, Ser: 1.17 mg/dL — ABNORMAL HIGH (ref 0.44–1.00)
GFR, Estimated: 45 mL/min — ABNORMAL LOW (ref >60)
Glucose, Bld: 115 mg/dL — ABNORMAL HIGH (ref 70–99)
Potassium: 4 mmol/L (ref 3.5–5.1)
Sodium: 137 mmol/L (ref 135–145)
Total Bilirubin: 0.6 mg/dL (ref 0.3–1.2)
Total Protein: 6 g/dL — ABNORMAL LOW (ref 6.5–8.1)

## 2021-11-11 LAB — URINE CULTURE: Culture: NO GROWTH

## 2021-11-11 LAB — PHOSPHORUS: Phosphorus: 2.7 mg/dL (ref 2.5–4.6)

## 2021-11-11 LAB — RETICULOCYTES
Immature Retic Fract: 10.6 % (ref 2.3–15.9)
RBC.: 3.41 MIL/uL — ABNORMAL LOW (ref 3.87–5.11)
Retic Count, Absolute: 69.9 10*3/uL (ref 19.0–186.0)
Retic Ct Pct: 2.1 % (ref 0.4–3.1)

## 2021-11-11 LAB — MAGNESIUM: Magnesium: 2.1 mg/dL (ref 1.7–2.4)

## 2021-11-11 LAB — IRON AND TIBC
Iron: 41 ug/dL (ref 28–170)
Saturation Ratios: 13 % (ref 10.4–31.8)
TIBC: 314 ug/dL (ref 250–450)
UIBC: 273 ug/dL

## 2021-11-11 LAB — VITAMIN B12: Vitamin B-12: 879 pg/mL (ref 180–914)

## 2021-11-11 LAB — FOLATE: Folate: 10.5 ng/mL (ref >5.9)

## 2021-11-11 LAB — FERRITIN: Ferritin: 66 ng/mL (ref 11–307)

## 2021-11-11 MED ORDER — SODIUM CHLORIDE 1 G PO TABS: 2.0000 g | ORAL_TABLET | Freq: Three times a day (TID) | ORAL | 0 refills | Status: DC

## 2021-11-11 MED ORDER — MELATONIN 5 MG PO TABS: 5.0000 mg | ORAL_TABLET | Freq: Every evening | ORAL | 0 refills | Status: DC | PRN

## 2021-11-11 MED ORDER — SACUBITRIL-VALSARTAN 24-26 MG PO TABS: 1.0000 | ORAL_TABLET | Freq: Two times a day (BID) | ORAL | 0 refills | Status: DC

## 2021-11-11 MED ORDER — TAMSULOSIN HCL 0.4 MG PO CAPS
0.4000 mg | ORAL_CAPSULE | Freq: Every day | ORAL | 0 refills | Status: AC
Start: 2021-11-11 — End: 2021-12-11

## 2021-11-11 MED ORDER — MAGNESIUM OXIDE -MG SUPPLEMENT 400 (240 MG) MG PO TABS
400.0000 mg | ORAL_TABLET | Freq: Two times a day (BID) | ORAL | 0 refills | Status: DC
Start: 2021-11-11 — End: 2021-11-11

## 2021-11-11 MED ORDER — SODIUM CHLORIDE 1 G PO TABS: 2.0000 g | ORAL_TABLET | Freq: Three times a day (TID) | ORAL | 0 refills | Status: AC

## 2021-11-11 MED ORDER — MAGNESIUM OXIDE -MG SUPPLEMENT 400 (240 MG) MG PO TABS: 400.0000 mg | ORAL_TABLET | Freq: Two times a day (BID) | ORAL | 0 refills | Status: AC

## 2021-11-11 MED ORDER — LIDOCAINE VISCOUS HCL 2 % MT SOLN: 15.0000 mL | OROMUCOSAL | 0 refills | Status: DC | PRN

## 2021-11-11 MED ORDER — NYSTATIN 100000 UNIT/ML MT SUSP: 5.0000 mL | Freq: Four times a day (QID) | OROMUCOSAL | 0 refills | Status: DC

## 2021-11-11 NOTE — Plan of Care (Signed)
  Problem: Education: Goal: Knowledge of General Education information will improve Description: Including pain rating scale, medication(s)/side effects and non-pharmacologic comfort measures Outcome: Adequate for Discharge   

## 2021-11-11 NOTE — Progress Notes (Signed)
Discharge instructions (including medications) discussed with and copy provided to patient/caregiver 

## 2021-11-11 NOTE — Progress Notes (Signed)
Pt notes some burning w/ urination and feelings of urgency. So far has urinated only about 200 mLs. 22mLs noted in bladder via bladder scan. Urine culture has already been set. Vitals stable. Will continue to monitor.

## 2021-11-11 NOTE — Discharge Summary (Addendum)
Physician Discharge Summary  Deborah Jordan:[id number] DOB: [birth year] DOA: 10/31/2021  PCP: [name], MD  Admit date: 10/31/2021 Discharge date: 11/11/2021  Time spent: 35 minutes  Recommendations for Outpatient Follow-up:   Acute diastolic CHF with exacerbation/cardiogenic shock -Multifactorial, CHF exacerbation, pulmonary edema, MV regurgitation.  Treat underlying causes -11/23 resolved -Strict in and out - Daily weight - Amlodipine 5 mg daily -Entresto 24-26 mg 1 tablet BID   Complete heart block - S/p pacer    HTN - See CHF   Acute pulmonary respiratory failure with hypoxia -11/23 resolved patient on room air sitting in chair comfortably    Acute on CKD stage 3a, (baseline Cr 1.0)   -11/23 patient's renal functions continues to worsen. -Per patient no heart catheterization.  We will review medicine list for offending agents. - 11/23 DC Trimethoprim -Hold all nephrotoxic medication  Lab Results  Component Value Date   CREATININE 1.17 (H) 11/11/2021   CREATININE 1.41 (H) 11/10/2021   CREATININE 1.31 (H) 11/09/2021   CREATININE 1.02 (H) 11/08/2021   CREATININE 1.05 (H) 11/07/2021   SIADH - Fluid restriction 1297ml/day -Short course of NaCl tablets 2 g TID - Hold furosemide   Anemia of chronic disease? - Occult blood pending - Anemia panel pending   Depression.      Moderate calorie protein malnutrition.   Oral thrush.  -Nystatin rinse QID   Obesity class 2.  (BMI 34.59 kg/m)   Discharge Diagnoses:  Principal Problem:   CHF (congestive heart failure) (HCC) Active Problems:   Hyponatremia   Hypertension   AKI (acute kidney injury) ([place])   Symptomatic bradycardia   Dysuria   Anemia   Acute heart failure with preserved ejection fraction (HFpEF) (HCC)   RBBB (right bundle branch block with left anterior fascicular block)   Hypomagnesemia   Atherosclerosis of both carotid arteries   Acute respiratory distress   Heart block AV  complete (HCC)   Acute pulmonary edema (HCC)   Nonrheumatic mitral valve regurgitation   Malnutrition of moderate degree   Acute renal failure with acute renal cortical necrosis superimposed on stage 3a chronic kidney disease ([place])   Discharge Condition: stable  Diet recommendation: Regular diet with salt supplement  Filed Weights   11/09/21 0500 11/10/21 0409 11/11/21 0346  Weight: 72.5 kg 72.5 kg 72.5 kg    History of present illness:  Deborah Jordan was admitted to the hospital with the working diagnosis of acute diastolic heart failure exacerbation, complicated with complete heart block. Now sp pacer.  Prolonged hospitalization due to hyponatremia   87 year old female past medical history for hypertension, dyslipidemia, diastolic heart failure, coronary artery disease, hyponatremia, obesity and prediabetes who presented with dyspnea, and difficulty urinating.  Recent hospitalization 10/5-09/2014 for septic shock due to urinary infection.  At home she was being followed by home health, she was noted to have bradycardia down to 33 bpm.  Her carvedilol was discontinued and amlodipine was decreased to 5 mg from 10 mg as an outpatient.  Patient reported lower extremity edema and worsening dyspnea that prompted her to call EMS.  She was found to have a heart rate in the 95A and systolic blood pressure in the 60s, she was transported to the hospital. On her initial physical examination her heart rate was 36-55, respiratory rate 16-24, blood pressure 119/98, oxygen saturation 98%.  Her lungs were clear to auscultation bilaterally, heart S1-S2, present, bradycardic, abdomen soft, no lower extremity edema.   Na 124, K 3,6, CL 91, bicarb  20, glucose 119, BUN 17, cr 1,66.  High sensitive troponin 21-20 Wbc 9,7, hgb 9,8, hct 29,0, Plt 274 SARS COVID 19 negative    Urine analysis with sg 1.011, negative nitrates and leukocytes    Chest film with mild cardiomegaly, hilar vascular congestion with  increase interstitial markings bilaterally.    EKG 48 bpm, left axis deviation, right bundle branch block, QTC 531, second degree Mobitz 2, poor R wave progression, no significant ST segment or T wave changes.   Patient received furosemide for diuresis, required noninvasive mechanical ventilation. Patient continued to have bradycardia, electrophysiology was consulted for complete heart block.   Patient underwent right heart catheterization and temporary pacemaker placement.  She was found and decompensated heart failure due to mitral regurgitation. Transfer to intensive care unit for further heart failure management. She was placed on milrinone for inotropic support.   10/17 patient was liberated from noninvasive mechanical ventilation. 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  No vegetations, mild prolapse of the posterior mitral leaflet, with mild mitral regurgitation.   10/18 patient underwent Saint Jude dual-chamber pacemaker implantation.   Patient very weak and deconditioned, physical therapy has recommended home health services. Patient continue to be hyponatremic Plan for discharge home with home health services.   Hospital Course:  See above  Procedures: Right Heart Catheterization and temporary pacemaker placement.  She was found and decompensated heart failure due to mitral regurgitation. 10/17 patient was liberated from noninvasive mechanical ventilation. 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  No vegetations, mild prolapse of the posterior mitral leaflet, with mild mitral regurgitation.  10/18 patient underwent Saint Jude dual-chamber pacemaker implantation.   Consultations: Cardiology EP  Cultures    Antibiotics Anti-infectives (From admission, onward)    Start     Ordered Stop   11/05/21 2300  ceFAZolin  (ANCEF) IVPB 1 g/50 mL premix        11/05/21 1718 11/06/21 0741   11/05/21 2013  ceFAZolin (ANCEF) IVPB 1 g/50 mL premix  Status:  Discontinued        11/05/21 1713 11/05/21 1717   11/05/21 0815  gentamicin (GARAMYCIN) 80 mg in sodium chloride 0.9 % 500 mL irrigation        11/05/21 0728 11/05/21 1556   11/05/21 0815  ceFAZolin (ANCEF) IVPB 2g/100 mL premix        11/05/21 0728 11/05/21 1638   10/31/21 1600  trimethoprim (TRIMPEX) tablet 100 mg  Status:  Discontinued        10/31/21 1444 11/10/21 1233         Discharge Exam: Vitals:   11/10/21 1936 11/10/21 2259 11/11/21 0346 11/11/21 0749  BP: (!) 105/43 (!) 117/45 (!) 121/47 112/61  Pulse: 91 83 79 85  Resp: 20 19 19 20   Temp: 97.9 F (36.6 C) 98.3 F (36.8 C) 97.9 F (36.6 C) 98.3 F (36.8 C)  TempSrc: Oral Oral Oral Oral  SpO2: 99% 94% 96% 96%  Weight:   72.5 kg   Height:        General: A/O x4 No acute respiratory distress Eyes: negative scleral hemorrhage, negative anisocoria, negative icterus ENT: Negative Runny nose, negative gingival bleeding, Neck:  Negative scars, masses, torticollis, lymphadenopathy, JVD Lungs: Clear to auscultation bilaterally without wheezes or crackles Cardiovascular: Regular rate and rhythm without murmur gallop or rub normal S1 and S2   Discharge Instructions   Allergies as of 11/11/2021  Reactions   Ace Inhibitors Cough   Hydrochlorothiazide Other (See Comments)   Low Sodium        Medication List     STOP taking these medications    atorvastatin 20 MG tablet Commonly known as: LIPITOR   sulfamethoxazole-trimethoprim 800-160 MG tablet Commonly known as: BACTRIM DS   trimethoprim 100 MG tablet Commonly known as: TRIMPEX       TAKE these medications    acetaminophen 650 MG CR tablet Commonly known as: TYLENOL Take 650 mg by mouth every 8 (eight) hours as needed for pain.   ALPRAZolam 0.25 MG tablet Commonly known as: XANAX Take 1 tablet (0.25 mg  total) by mouth 3 (three) times daily as needed for anxiety.   amLODipine 10 MG tablet Commonly known as: NORVASC Take 0.5 tablets (5 mg total) by mouth daily.   aspirin EC 81 MG tablet Take 81 mg by mouth daily. Swallow whole.   DULoxetine 20 MG capsule Commonly known as: Cymbalta Take 1 capsule (20 mg total) by mouth daily.   esomeprazole 40 MG capsule Commonly known as: NEXIUM Take 40 mg by mouth daily.   gabapentin 100 MG capsule Commonly known as: NEURONTIN Take 1 capsule (100 mg total) by mouth at bedtime.   lidocaine 2 % solution Commonly known as: XYLOCAINE Use as directed 15 mLs in the mouth or throat every 4 (four) hours as needed for mouth pain.   loratadine 10 MG tablet Commonly known as: CLARITIN Take 10 mg by mouth daily.   magnesium oxide 400 (240 Mg) MG tablet Commonly known as: MAG-OX Take 1 tablet (400 mg total) by mouth 2 (two) times daily.   melatonin 5 MG Tabs Take 1 tablet (5 mg total) by mouth at bedtime as needed.   nystatin 100000 UNIT/ML suspension Commonly known as: MYCOSTATIN Take 5 mLs (500,000 Units total) by mouth 4 (four) times daily.   sacubitril-valsartan 24-26 MG Commonly known as: ENTRESTO Take 1 tablet by mouth 2 (two) times daily.   sodium chloride 1 g tablet Take 2 tablets (2 g total) by mouth 3 (three) times daily with meals. What changed:  how much to take when to take this   tamsulosin 0.4 MG Caps capsule Commonly known as: FLOMAX Take 1 capsule (0.4 mg total) by mouth daily after supper.   Vitamin D 50 MCG (2000 UT) Caps Take 2,000 Units by mouth daily.       Allergies  Allergen Reactions   Ace Inhibitors Cough   Hydrochlorothiazide Other (See Comments)    Low Sodium    Follow-up Information     [facility] Follow up.   Why: on 11/30 at 2 pm for post pacemaker wound check Contact information: [place]  [id number] [phone]                 The results of significant diagnostics from this hospitalization (including imaging, microbiology, ancillary and laboratory) are listed below for reference.    Significant Diagnostic Studies: DG Chest 2 View  Result Date: 11/06/2021 CLINICAL DATA:  Placement of pacemaker EXAM: CHEST - 2 VIEW COMPARISON:  Previous studies including the examination of 11/04/2021 FINDINGS: Transverse diameter of heart is increased. There are no signs of alveolar pulmonary edema. Increased density in the left lower lung fields may be due to pleural effusion and infiltrate. There is no pneumothorax. There is interval placement of pacemaker battery in the left infraclavicular region with tips of  leads in right atrium and right ventricle. IMPRESSION: Cardiomegaly. Interval placement of pacemaker. Increased density in the left lower lung fields has not changed significantly suggesting pleural effusion and possibly underlying atelectasis. Electronically Signed   By: Elmer Picker M.D.   On: 11/06/2021 18:43   CARDIAC CATHETERIZATION  Result Date: 11/02/2021 Right heart catheterization and temporary transvenous pacemaker implantation via right IJ 11/02/2021: RA: 21/20, mean 16 mmHg. RV 46/16, EDP 22 mmHg. PA 61/18, mean 35 mmHg.  PA saturation 72%. PW 43/22, mean 23 mmHg.  Giant V waves noted suggestive of severe MR. CO 7.49, CI 4.33 by Fick.  QP/QS 1.00. TPVR 8.08 Wood units.  PVR 2.77 Wood units. SVR 900 dynes. Temporary transvenous pacemaker placed into the right ventricular apex, capture obtained at 1 mV.  30 cm from IJ, settings at 80 bpm asynchronous at 5 mA. Impression: Findings are consistent with acute decompensated heart failure with pulmonary edema secondary to severe mitral regurgitation.  We will transfer to the unit and try to diurese her and use vasodilator therapy.  If she does not respond, we may have to consider mechanical support.   CT Chest High  Resolution  Result Date: 11/05/2021 CLINICAL DATA:  87 year old female with history of shortness of breath. EXAM: CT CHEST WITHOUT CONTRAST TECHNIQUE: Multidetector CT imaging of the chest was performed following the standard protocol without intravenous contrast. High resolution imaging of the lungs, as well as inspiratory and expiratory imaging, was performed. COMPARISON:  Chest CT 09/22/2021. FINDINGS: Cardiovascular: Heart size is mildly enlarged. There is no significant pericardial fluid, thickening or pericardial calcification. There is aortic atherosclerosis, as well as atherosclerosis of the great vessels of the mediastinum and the coronary arteries, including calcified atherosclerotic plaque in the left main, left anterior descending, left circumflex and right coronary arteries. Calcifications of the aortic valve. Right-sided internal jugular pacemaker lead with tip terminating in the right ventricular apex. Mediastinum/Nodes: No pathologically enlarged mediastinal or hilar lymph nodes. Esophagus is unremarkable in appearance. No axillary lymphadenopathy. Lungs/Pleura: There are some scattered ill-defined areas of apparent airspace consolidation in the lungs bilaterally, most evident in the periphery of the lungs, likely of infectious or inflammatory etiology. High-resolution images otherwise demonstrate no generalized areas of ground-glass attenuation, septal thickening, subpleural reticulation, traction bronchiectasis or honeycombing to indicate interstitial lung disease. Inspiratory and expiratory imaging demonstrates some mild air trapping indicative of small airways disease. Small bilateral pleural effusions lying predominantly dependently on the left and in the sub pulmonic region on the right with areas of apparent passive subsegmental atelectasis in the lower lobes of the lungs bilaterally. No definite suspicious appearing pulmonary nodules or masses are noted. Upper Abdomen: Aortic  atherosclerosis. Musculoskeletal: There are no aggressive appearing lytic or blastic lesions noted in the visualized portions of the skeleton. IMPRESSION: 1. Patchy ill-defined areas of peripheral consolidation in the lungs bilaterally, favored to be of infectious or inflammatory etiology. No definitive imaging findings to suggest interstitial lung disease on today's examination. 2. Small bilateral pleural effusions with extensive areas of passive subsegmental atelectasis in the lower lobes of the lungs bilaterally. 3. Mild cardiomegaly. 4. Aortic atherosclerosis, in addition to left main and 3 vessel coronary artery disease. 5. There are calcifications of the aortic valve. Echocardiographic correlation for evaluation of potential valvular dysfunction may be warranted if clinically indicated. Aortic Atherosclerosis (ICD10-I70.0). Electronically Signed   By: Vinnie Langton M.D.   On: 11/05/2021 08:19   EP PPM/ICD IMPLANT  Result Date: 11/05/2021 SURGEON:  Allegra Lai, MD  PREPROCEDURE DIAGNOSIS:  second degree AV block   POSTPROCEDURE DIAGNOSIS:  second degree AV block    PROCEDURES:  1. Pacemaker implantation.   INTRODUCTION:  FRANCIES INCH is a 87 y.o. female with a history of bradycardia who presents today for pacemaker implantation.  The patient reports intermittent episodes of dizziness over the past few months.  No reversible causes have been identified.  The patient therefore presents today for pacemaker implantation.   DESCRIPTION OF PROCEDURE:  Informed written consent was obtained, and  the patient was brought to the electrophysiology lab in a fasting state.  The patient required no sedation for the procedure today.  The patients left chest was prepped and draped in the usual sterile fashion by the EP lab staff. The skin overlying the left deltopectoral region was infiltrated with lidocaine for local analgesia.  A 4-cm incision was made over the left deltopectoral region.  A left subcutaneous  pacemaker pocket was fashioned using a combination of sharp and blunt dissection. Electrocautery was required to assure hemostasis.  RA/RV Lead Placement: The left axillary vein was therefore cannulated.  Through the left axillary vein, a [employer] model Tendril MRI [id number] (serial number  [id number]) right atrial lead and an [employer] model Tendril MRI [id number] (serial number  [id number]) right ventricular lead were advanced with fluoroscopic visualization into the right atrial appendage and right ventricular apex positions respectively.  Initial atrial lead P- waves measured 4.6 mV with impedance of 628 ohms and a threshold of 1.9 V at 0.5 msec.  Right ventricular lead R-waves measured 7.7 mV with an impedance of 704 ohms and a threshold of 0.6 V at 0.5 msec.  Both leads were secured to the pectoralis fascia using #2-0 silk over the suture sleeves. Device Placement:  The leads were then connected to an Craig MRI  model [id number] (serial number  [id number] ) pacemaker.  The pocket was irrigated with copious gentamicin solution.  The pacemaker was then placed into the pocket.  The pocket was then closed in 3 layers with 2.0 Vicryl suture for the 3.0 Vicryl suture subcutaneous and subcuticular layers.  Steri-  Strips and a sterile dressing were then applied. EBL<63ml.  There were no early apparent complications.   CONCLUSIONS:  1. Successful implantation of a [employer] Jude Medical Assurity MRI dual-chamber pacemaker for symptomatic bradycardia  2. No early apparent complications.       Will Curt Bears, MD 11/05/2021 4:12 PM   DG CHEST PORT 1 VIEW  Result Date: 11/03/2021 CLINICAL DATA:  Chest pain. EXAM: PORTABLE CHEST 1 VIEW COMPARISON:  October 31, 2021 FINDINGS: A right internal jugular pacer wire is in place. Mild atelectasis is seen within the retrocardiac region of the left lung base. There is no evidence of a pleural effusion or pneumothorax. The cardiac silhouette is enlarged and unchanged in  size. Degenerative changes seen throughout the thoracic spine. IMPRESSION: Stable cardiomegaly with mild left basilar atelectasis. Electronically Signed   By: Virgina Norfolk M.D.   On: 11/03/2021 17:02   DG Chest Portable 1 View  Result Date: 10/31/2021 CLINICAL DATA:  Shortness of breath.  Assess for pulmonary edema. EXAM: PORTABLE CHEST 1 VIEW COMPARISON:  September 24, 2021 FINDINGS: The heart size is enlarged. Mediastinal contour is normal. There is increased pulmonary interstitium bilaterally. Probable small left pleural effusion is identified. There is no focal pneumonia. No acute abnormality is identified in the osseous structures. IMPRESSION: Congestive heart failure. Electronically Signed   By: Abelardo Diesel  M.D.   On: 10/31/2021 09:22   ECHOCARDIOGRAM COMPLETE  Result Date: 11/02/2021    ECHOCARDIOGRAM REPORT   Patient Name:   TRENESHA ALCAIDE [facility] Date of Exam: 11/02/2021 Medical Rec #:  [id number]     Height:       57.0 in Accession #:    [id number]    Weight:       182.1 lb Date of Birth:  [birth year]     BSA:          1.728 m Patient Age:    22 years      BP:           175/81 mmHg Patient Gender: F             HR:           81 bpm. Exam Location:  Inpatient Procedure: 2D Echo Indications:     dyspnea. respiratory distress  History:         Patient has prior history of Echocardiogram examinations, most                  recent 09/24/2021. CHF, Arrythmias:external pacer; Risk                  Factors:Hypertension.  Sonographer:     Johny Chess RDCS Referring Phys:  [id number] Adrian Prows Diagnosing Phys: Adrian Prows MD  Sonographer Comments: Image acquisition challenging due to respiratory motion. IMPRESSIONS  1. Left ventricular ejection fraction, by estimation, is 65 to 70%. The left ventricle has normal function. Left ventricular endocardial border not optimally defined to evaluate regional wall motion. There is mild concentric left ventricular hypertrophy. Left ventricular diastolic parameters are  indeterminate.  2. Right ventricular systolic function is normal. The right ventricular size is normal.  3. Left atrial size was severely dilated.  4. The mitral valve is myxomatous. Mild mitral valve regurgitation. There is moderate late systolic prolapse of both leaflets of the mitral valve.  5. Elevated velocity due to hyperdynamic LVEF. The aortic valve is normal in structure. Aortic valve regurgitation is not visualized. No aortic stenosis is present.  6. The inferior vena cava is dilated in size with <50% respiratory variability, suggesting right atrial pressure of 15 mmHg. Comparison(s): No significant change from prior study. 09/24/2021. FINDINGS  Left Ventricle: Diastolic function not assessed due to paced rhythm. Left ventricular ejection fraction, by estimation, is 65 to 70%. The left ventricle has normal function. Left ventricular endocardial border not optimally defined to evaluate regional wall motion. The left ventricular internal cavity size was normal in size. There is mild concentric left ventricular hypertrophy. Left ventricular diastolic parameters are indeterminate. Right Ventricle: The right ventricular size is normal. No increase in right ventricular wall thickness. Right ventricular systolic function is normal. Left Atrium: Left atrial size was severely dilated. Right Atrium: Right atrial size was normal in size. Pericardium: There is no evidence of pericardial effusion. Mitral Valve: The mitral valve is myxomatous. There is moderate late systolic prolapse of both leaflets of the mitral valve. There is mild thickening of the mitral valve leaflet(s). Mild mitral valve regurgitation. Tricuspid Valve: The tricuspid valve is normal in structure. Tricuspid valve regurgitation is not demonstrated. No evidence of tricuspid stenosis. Aortic Valve: Elevated velocity due to hyperdynamic LVEF. The aortic valve is normal in structure. Aortic valve regurgitation is not visualized. No aortic stenosis is  present. Aortic valve mean gradient measures 14.0 mmHg. Aortic valve peak gradient measures 25.4 mmHg. Pulmonic Valve: The pulmonic valve  was normal in structure. Pulmonic valve regurgitation is not visualized. No evidence of pulmonic stenosis. Aorta: The aortic root is normal in size and structure. Venous: The inferior vena cava is dilated in size with less than 50% respiratory variability, suggesting right atrial pressure of 15 mmHg. IAS/Shunts: No atrial level shunt detected by color flow Doppler. Additional Comments: A device lead is visualized.  LEFT VENTRICLE PLAX 2D LVIDd:         4.80 cm LVIDs:         2.60 cm LV PW:         1.20 cm LV IVS:        1.20 cm  RIGHT VENTRICLE TAPSE (M-mode): 2.2 cm LEFT ATRIUM             Index        RIGHT ATRIUM           Index LA diam:        4.60 cm 2.66 cm/m   RA Area:     13.40 cm LA Vol (A2C):   50.1 ml 28.99 ml/m  RA Volume:   29.25 ml  16.92 ml/m LA Vol (A4C):   70.1 ml 40.56 ml/m LA Biplane Vol: 59.1 ml 34.19 ml/m  AORTIC VALVE AV Vmax:      252.00 cm/s AV Vmean:     171.000 cm/s AV VTI:       0.424 m AV Peak Grad: 25.4 mmHg AV Mean Grad: 14.0 mmHg  AORTA Ao Asc diam: 3.30 cm Adrian Prows MD Electronically signed by Adrian Prows MD Signature Date/Time: 11/02/2021/10:03:52 PM    Final    ECHO TEE  Result Date: 11/04/2021    TRANSESOPHOGEAL ECHO REPORT   Patient Name:   SARAHMARIE LEAVEY [facility] Date of Exam: 11/04/2021 Medical Rec #:  [id number]     Height:       57.0 in Accession #:    [id number]    Weight:       179.0 lb Date of Birth:  04/03/1933     BSA:          1.716 m Patient Age:    50 years      BP:           149/60 mmHg Patient Gender: F             HR:           64 bpm. Exam Location:  Inpatient Procedure: Transesophageal Echo and Color Doppler Indications:     Mitral Regurgitation  History:         Patient has prior history of Echocardiogram examinations, most                  recent 11/02/2021. Cardiomegaly, CAD, Arrythmias:RBBB; Risk                   Factors:Hypertension.  Sonographer:     Bernadene Person RDCS Referring Phys:  [place] Diagnosing Phys: Adrian Prows MD PROCEDURE: After discussion of the risks and benefits of a TEE, an informed consent was obtained from the patient. The transesophogeal probe was passed without difficulty through the esophogus of the patient. Local oropharyngeal anesthetic was provided with Cetacaine. Sedation performed by different physician. The patient was monitored while under deep sedation. Anesthestetic sedation was provided intravenously by Anesthesiology: 60.9mg  of Propofol. The patient's vital signs; including heart rate, blood pressure, and oxygen saturation; remained stable throughout the procedure. The patient developed no complications during the procedure.  IMPRESSIONS  1. Left ventricular ejection fraction, by estimation, is 60 to 65%. The left ventricle has normal function. The left ventricle has no regional wall motion abnormalities.  2. Right ventricular systolic function is normal. The right ventricular size is normal.  3. Left atrial size was moderately dilated.  4. Pacer lead noted.  5. The mitral valve is myxomatous. Mild mitral valve regurgitation.  6. The aortic valve is normal in structure. Aortic valve regurgitation is trivial. No aortic stenosis is present. Conclusion(s)/Recommendation(s): Quick study done to decrease respiratory compromise. Microvegetations cannot be excluded in a degenerated myxomatous valve. Repeat TEE in 4 weeks if clinically indicated. FINDINGS  Left Ventricle: Left ventricular ejection fraction, by estimation, is 60 to 65%. The left ventricle has normal function. The left ventricle has no regional wall motion abnormalities. The left ventricular internal cavity size was normal in size. There is  no left ventricular hypertrophy. Right Ventricle: The right ventricular size is normal. No increase in right ventricular wall thickness. Right ventricular systolic function is normal. Left  Atrium: Left atrial size was moderately dilated. Right Atrium: Right atrial size was normal in size. Pacer lead noted. Pericardium: There is no evidence of pericardial effusion. Mitral Valve: The mitral valve is myxomatous. Mild mitral valve regurgitation. Tricuspid Valve: The tricuspid valve is normal in structure. Tricuspid valve regurgitation is trivial. No evidence of tricuspid stenosis. Aortic Valve: The aortic valve is normal in structure. Aortic valve regurgitation is trivial. No aortic stenosis is present. Pulmonic Valve: The pulmonic valve was normal in structure. Pulmonic valve regurgitation is not visualized. No evidence of pulmonic stenosis. Aorta: The aortic root is normal in size and structure. IAS/Shunts: No atrial level shunt detected by color flow Doppler. Adrian Prows MD Electronically signed by Adrian Prows MD Signature Date/Time: 11/04/2021/6:14:14 PM    Final     Microbiology: Recent Results (from the past 240 hour(s))  Culture, blood (Routine X 2) w Reflex to ID Panel     Status: None   Collection Time: 11/02/21 11:39 AM   Specimen: BLOOD RIGHT HAND  Result Value Ref Range Status   Specimen Description BLOOD RIGHT HAND  Final   Special Requests   Final    BOTTLES DRAWN AEROBIC ONLY Blood Culture results may not be optimal due to an inadequate volume of blood received in culture bottles   Culture   Final    NO GROWTH 5 DAYS Performed at [facility], [place]. [address], [place], [place] [address]    Report Status 11/07/2021 FINAL  Final  Culture, blood (Routine X 2) w Reflex to ID Panel     Status: None   Collection Time: 11/02/21 11:40 AM   Specimen: BLOOD LEFT HAND  Result Value Ref Range Status   Specimen Description BLOOD LEFT HAND  Final   Special Requests   Final    BOTTLES DRAWN AEROBIC ONLY Blood Culture results may not be optimal due to an inadequate volume of blood received in culture bottles   Culture   Final    NO GROWTH 5 DAYS Performed at [place], [place] [address], [place],  [address]    Report Status 11/07/2021 FINAL  Final  Surgical PCR screen     Status: None   Collection Time: 11/04/21  9:23 PM   Specimen: Nasal Mucosa; Nasal Swab  Result Value Ref Range Status   MRSA, PCR NEGATIVE NEGATIVE Final   Staphylococcus aureus NEGATIVE NEGATIVE Final    Comment: (NOTE) The  Xpert SA Assay (FDA approved for NASAL specimens in patients 42 years of age and older), is one component of a comprehensive surveillance program. It is not intended to diagnose infection nor to guide or monitor treatment. Performed at [facility], [place] [address], [place], [place] [address]   Resp Panel by RT-PCR (Flu A&B, Covid) Nasopharyngeal Swab     Status: None   Collection Time: 11/05/21  1:51 PM   Specimen: Nasopharyngeal Swab; Nasopharyngeal(NP) swabs in vial transport medium  Result Value Ref Range Status   SARS Coronavirus 2 by RT PCR NEGATIVE NEGATIVE Final    Comment: (NOTE) SARS-CoV-2 target nucleic acids are NOT DETECTED.  The SARS-CoV-2 RNA is generally detectable in upper respiratory specimens during the acute phase of infection. The lowest concentration of SARS-CoV-2 viral copies this assay can detect is 138 copies/mL. A negative result does not preclude SARS-Cov-2 infection and should not be used as the sole basis for treatment or other patient management decisions. A negative result may occur with  improper specimen collection/handling, submission of specimen other than nasopharyngeal swab, presence of viral mutation(s) within the areas targeted by this assay, and inadequate number of viral copies(<138 copies/mL). A negative result must be combined with clinical observations, patient history, and epidemiological information. The expected result is Negative.  Fact Sheet for Patients:  EntrepreneurPulse.com.au  Fact Sheet for Healthcare Providers:  IncredibleEmployment.be  This test is  no t yet approved or cleared by the [place] FDA and  has been authorized for detection and/or diagnosis of SARS-CoV-2 by FDA under an Emergency Use Authorization (EUA). This EUA will remain  in effect (meaning this test can be used) for the duration of the COVID-19 declaration under Section 564(b)(1) of the Act, 21 U.S.C.section 360bbb-3(b)(1), unless the authorization is terminated  or revoked sooner.       Influenza A by PCR NEGATIVE NEGATIVE Final   Influenza B by PCR NEGATIVE NEGATIVE Final    Comment: (NOTE) The Xpert Xpress SARS-CoV-2/FLU/RSV plus assay is intended as an aid in the diagnosis of influenza from Nasopharyngeal swab specimens and should not be used as a sole basis for treatment. Nasal washings and aspirates are unacceptable for Xpert Xpress SARS-CoV-2/FLU/RSV testing.  Fact Sheet for Patients: EntrepreneurPulse.com.au  Fact Sheet for Healthcare Providers: IncredibleEmployment.be  This test is not yet approved or cleared by the [place] FDA and has been authorized for detection and/or diagnosis of SARS-CoV-2 by FDA under an Emergency Use Authorization (EUA). This EUA will remain in effect (meaning this test can be used) for the duration of the COVID-19 declaration under Section 564(b)(1) of the Act, 21 U.S.C. section 360bbb-3(b)(1), unless the authorization is terminated or revoked.  Performed at [facility], [place] [address] [place], [place] [address]   Urine Culture     Status: None   Collection Time: 11/10/21 11:18 AM   Specimen: Urine, Clean Catch  Result Value Ref Range Status   Specimen Description URINE, CLEAN CATCH  Final   Special Requests NONE  Final   Culture   Final    NO GROWTH Performed at [facility], [place] [address], [place], [place] [address]    Report Status 11/11/2021 FINAL  Final     Labs: Basic Metabolic Panel: Recent Labs  Lab 11/07/21 1016 11/08/21 0243  11/09/21 0847 11/10/21 0058 11/11/21 0100  NA 129* 126* 129* 133* 137  K 4.4 3.9 3.9 3.8 4.0  CL 94* 92* 95* 97* 103  CO2  27 25 22 27 27   GLUCOSE 114* 110* 166* 109* 115*  BUN 10 36* 108* 108* 66*  CREATININE 1.05* 1.02* 1.31* 1.41* 1.17*  CALCIUM 9.1 9.1 10.0 9.8 9.7  MG  --   --   --   --  2.1  PHOS  --   --   --   --  2.7   Liver Function Tests: Recent Labs  Lab 11/11/21 0100  AST 25  ALT 14  ALKPHOS 76  BILITOT 0.6  PROT 6.0*  ALBUMIN 3.1*   No results for input(s): LIPASE, AMYLASE in the last 168 hours. No results for input(s): AMMONIA in the last 168 hours. CBC: Recent Labs  Lab 11/04/21 1931 11/06/21 0613 11/11/21 0100  WBC  --  6.3 6.2  NEUTROABS  --   --  2.8  HGB 8.8* 10.7* 9.9*  HCT 26.4* 31.5* 29.2*  MCV  --  85.1 85.4  PLT  --  491* 593*   Cardiac Enzymes: No results for input(s): CKTOTAL, CKMB, CKMBINDEX, TROPONINI in the last 168 hours. BNP: BNP (last 3 results) Recent Labs    10/31/21 0919 11/03/21 1657 11/04/21 0636  BNP 525.0* 597.9* 512.9*    ProBNP (last 3 results) No results for input(s): PROBNP in the last 8760 hours.  CBG: No results for input(s): GLUCAP in the last 168 hours.     Signed:  Dia Crawford, MD [facility]

## 2021-11-15 ENCOUNTER — Telehealth: Payer: Self-pay | Admitting: Neurology

## 2021-11-15 DIAGNOSIS — I495 Sick sinus syndrome: Secondary | ICD-10-CM | POA: Diagnosis not present

## 2021-11-15 DIAGNOSIS — R300 Dysuria: Secondary | ICD-10-CM | POA: Diagnosis not present

## 2021-11-15 DIAGNOSIS — I5042 Chronic combined systolic (congestive) and diastolic (congestive) heart failure: Secondary | ICD-10-CM | POA: Diagnosis not present

## 2021-11-15 DIAGNOSIS — K644 Residual hemorrhoidal skin tags: Secondary | ICD-10-CM | POA: Diagnosis not present

## 2021-11-15 DIAGNOSIS — I779 Disorder of arteries and arterioles, unspecified: Secondary | ICD-10-CM | POA: Diagnosis not present

## 2021-11-15 DIAGNOSIS — E440 Moderate protein-calorie malnutrition: Secondary | ICD-10-CM | POA: Diagnosis not present

## 2021-11-15 DIAGNOSIS — I340 Nonrheumatic mitral (valve) insufficiency: Secondary | ICD-10-CM | POA: Diagnosis not present

## 2021-11-15 DIAGNOSIS — N1831 Chronic kidney disease, stage 3a: Secondary | ICD-10-CM | POA: Diagnosis not present

## 2021-11-15 NOTE — Telephone Encounter (Signed)
Pt. No showed appt on 11/27. LVM for her to call back if she was interested in rescheduling

## 2021-11-17 ENCOUNTER — Ambulatory Visit: Payer: Medicare Other | Admitting: Specialist

## 2021-11-17 ENCOUNTER — Ambulatory Visit (INDEPENDENT_AMBULATORY_CARE_PROVIDER_SITE_OTHER): Payer: Medicare Other

## 2021-11-17 ENCOUNTER — Other Ambulatory Visit: Payer: Self-pay

## 2021-11-17 DIAGNOSIS — I442 Atrioventricular block, complete: Secondary | ICD-10-CM

## 2021-11-17 LAB — CUP PACEART INCLINIC DEVICE CHECK
Battery Remaining Longevity: 73 mo
Battery Voltage: 3.05 V
Brady Statistic RA Percent Paced: 0.17 %
Brady Statistic RV Percent Paced: 98 %
Date Time Interrogation Session: 20221130143042
Implantable Lead Implant Date: 20221121
Implantable Lead Implant Date: 20221121
Implantable Lead Location: 753859
Implantable Lead Location: 753860
Implantable Pulse Generator Implant Date: 20221121
Lead Channel Impedance Value: 487.5 Ohm
Lead Channel Impedance Value: 575 Ohm
Lead Channel Pacing Threshold Amplitude: 0.5 V
Lead Channel Pacing Threshold Amplitude: 0.75 V
Lead Channel Pacing Threshold Pulse Width: 0.5 ms
Lead Channel Pacing Threshold Pulse Width: 0.5 ms
Lead Channel Sensing Intrinsic Amplitude: 10.8 mV
Lead Channel Sensing Intrinsic Amplitude: 5 mV
Lead Channel Setting Pacing Amplitude: 3.5 V
Lead Channel Setting Pacing Amplitude: 3.5 V
Lead Channel Setting Pacing Pulse Width: 0.5 ms
Lead Channel Setting Sensing Sensitivity: 2 mV
Pulse Gen Model: 2272
Pulse Gen Serial Number: 3975389

## 2021-11-17 NOTE — Patient Instructions (Signed)

## 2021-11-17 NOTE — Progress Notes (Signed)
Wound check appointment. Steri-strips removed. Wound without redness or edema. Incision edges approximated, wound well healed. Normal device function. Thresholds, sensing, and impedances consistent with implant measurements. Device programmed at 3.5V/auto capture programmed on for extra safety margin until 3 month visit. Histogram distribution appropriate for patient and level of activity. AT/AF burden <1%, EGM's appear AT w/ longest in duration 10 seconds. No high ventricular rates noted. MVP enabled and SAV/PAV extended to 229ms to allow intrinsic conduction. VIP features also programmed to VIP extension 150 ms, search interval 30 seconds, and search cycles 3.  Patient educated about wound care, arm mobility, lifting restrictions. ROV in 3 months with implanting physician.

## 2021-11-18 ENCOUNTER — Other Ambulatory Visit: Payer: Self-pay | Admitting: Family Medicine

## 2021-11-18 DIAGNOSIS — E2839 Other primary ovarian failure: Secondary | ICD-10-CM

## 2021-11-22 ENCOUNTER — Other Ambulatory Visit: Payer: Self-pay

## 2021-11-22 ENCOUNTER — Ambulatory Visit: Payer: Medicare Other | Admitting: Specialist

## 2021-11-22 ENCOUNTER — Encounter: Payer: Self-pay | Admitting: Specialist

## 2021-11-22 VITALS — BP 122/65 | HR 94 | Ht <= 58 in | Wt 167.0 lb

## 2021-11-22 DIAGNOSIS — M5136 Other intervertebral disc degeneration, lumbar region: Secondary | ICD-10-CM | POA: Diagnosis not present

## 2021-11-22 DIAGNOSIS — M4156 Other secondary scoliosis, lumbar region: Secondary | ICD-10-CM | POA: Diagnosis not present

## 2021-11-22 DIAGNOSIS — M4726 Other spondylosis with radiculopathy, lumbar region: Secondary | ICD-10-CM

## 2021-11-22 DIAGNOSIS — M1711 Unilateral primary osteoarthritis, right knee: Secondary | ICD-10-CM

## 2021-11-22 DIAGNOSIS — M7062 Trochanteric bursitis, left hip: Secondary | ICD-10-CM

## 2021-11-22 DIAGNOSIS — M4807 Spinal stenosis, lumbosacral region: Secondary | ICD-10-CM

## 2021-11-22 DIAGNOSIS — M48062 Spinal stenosis, lumbar region with neurogenic claudication: Secondary | ICD-10-CM

## 2021-11-22 DIAGNOSIS — M1712 Unilateral primary osteoarthritis, left knee: Secondary | ICD-10-CM

## 2021-11-22 MED ORDER — GABAPENTIN 100 MG PO CAPS: 100.0000 mg | ORAL_CAPSULE | Freq: Every day | ORAL | 3 refills | Status: AC

## 2021-11-22 MED ORDER — DULOXETINE HCL 20 MG PO CPEP: 20.0000 mg | ORAL_CAPSULE | Freq: Every day | ORAL | 3 refills | Status: AC

## 2021-11-22 NOTE — Patient Instructions (Signed)
Plan: Avoid bending, stooping and avoid lifting weights greater than 10 lbs. Avoid prolong standing and walking. Avoid frequent bending and stooping  No lifting greater than 10 lbs. May use ice or moist heat for pain. Weight loss is of benefit. Handicap license is approved. If the pain recurrs then we would consider a steroid injection call us if the pain recurrs. We would then contact Dr. Romona Curls secretary/Assistant and have her call to arrange for epidural steroid injection

## 2021-11-22 NOTE — Progress Notes (Signed)
Office Visit Note   Patient: Deborah Jordan           Date of Birth: Feb 02, 1933           MRN: [id number] Visit Date: 11/22/2021              Requested by: [name], MD [address] [place],  [place] [address] PCP: [name], MD   Assessment & Plan: Visit Diagnoses:  1. Spinal stenosis of lumbar region with neurogenic claudication   2. Degenerative disc disease, lumbar   3. Other secondary scoliosis, lumbar region   4. Other spondylosis with radiculopathy, lumbar region   5. Trochanteric bursitis of left hip   6. Unilateral primary osteoarthritis, left knee   7. Unilateral primary osteoarthritis, right knee   8. Spinal stenosis of lumbosacral region     Plan: Avoid bending, stooping and avoid lifting weights greater than 10 lbs. Avoid prolong standing and walking. Avoid frequent bending and stooping  No lifting greater than 10 lbs. May use ice or moist heat for pain. Weight loss is of benefit. Handicap license is approved. If the pain recurrs then we would consider a steroid injection call us if the pain recurrs. We would then contact Dr. Romona Curls secretary/Assistant and have her call to arrange for epidural steroid injection    Follow-Up Instructions: No follow-ups on file.   Orders:  No orders of the defined types were placed in this encounter.  No orders of the defined types were placed in this encounter.     Procedures: No procedures performed   Clinical Data: No additional findings.   Subjective: Chief Complaint  Patient presents with   Lower Back - Follow-up    87 year old female with history of CHF and heart block, had a pacemaker placed and also was admitted with sepsis and low sodium. Her back is doing okay and she has done well without an ESI.   Review of Systems  Constitutional: Negative.   HENT: Negative.    Eyes: Negative.   Respiratory: Negative.    Cardiovascular: Negative.   Gastrointestinal: Negative.    Endocrine: Negative.   Genitourinary: Negative.   Musculoskeletal: Negative.   Skin: Negative.   Allergic/Immunologic: Negative.   Neurological: Negative.   Hematological: Negative.   Psychiatric/Behavioral: Negative.      Objective: Vital Signs: BP 122/65   Pulse 94   Ht 4\' 9"  (1.448 m)   Wt 167 lb (75.8 kg)   BMI 36.14 kg/m   Physical Exam Constitutional:      Appearance: She is well-developed.  HENT:     Head: Normocephalic and atraumatic.  Eyes:     Pupils: Pupils are equal, round, and reactive to light.  Pulmonary:     Effort: Pulmonary effort is normal.     Breath sounds: Normal breath sounds.  Abdominal:     General: Bowel sounds are normal.     Palpations: Abdomen is soft.  Musculoskeletal:        General: Normal range of motion.     Cervical back: Normal range of motion and neck supple.     Lumbar back: Negative right straight leg raise test and negative left straight leg raise test.  Skin:    General: Skin is warm and dry.  Neurological:     Mental Status: She is alert and oriented to person, place, and time.  Psychiatric:        Behavior: Behavior normal.  Thought Content: Thought content normal.        Judgment: Judgment normal.   Back Exam   Tenderness  The patient is experiencing tenderness in the lumbar.  Muscle Strength  The patient has normal back strength. Right Quadriceps:  5/5  Left Quadriceps:  5/5  Right Hamstrings:  5/5  Left Hamstrings:  5/5   Tests  Straight leg raise right: negative Straight leg raise left: negative  Other  Toe walk: normal Heel walk: normal Sensation: normal    Specialty Comments:  No specialty comments available.  Imaging: No results found.   PMFS History: Patient Active Problem List   Diagnosis Date Noted   Acute renal failure with acute renal cortical necrosis superimposed on stage 3a chronic kidney disease ([place]) 11/10/2021   Malnutrition of moderate degree 11/09/2021   Encounter for  care of pacemaker 11/06/2021   Pacemaker Abbott dual chamber PACEMAKER ASSURITY DR-RF 11/05/2021 11/06/2021   Acute respiratory distress    Heart block AV complete (HCC)    Acute pulmonary edema (HCC)    Nonrheumatic mitral valve regurgitation    CHF (congestive heart failure) ([place]) 10/31/2021   Symptomatic bradycardia 10/31/2021   Dysuria 10/31/2021   Anemia 10/31/2021   Acute heart failure with preserved ejection fraction (HFpEF) (HCC)    RBBB (right bundle branch block with left anterior fascicular block)    Hypomagnesemia    Atherosclerosis of both carotid arteries    Bacteremia    Septic shock ([place]) 09/22/2021   AKI (acute kidney injury) ([facility])    Hypokalemia    Transaminitis    Dyspnea and respiratory abnormalities    Chronic bilateral low back pain with bilateral sciatica 09/27/2017   Bilateral chronic knee pain 03/13/2017   Pleural effusion 03/10/2013   Hyponatremia 03/10/2013   Hypertension 03/10/2013   Degenerative arthritis of hip 03/08/2013   Past Medical History:  Diagnosis Date   Anxiety    Arthritis    osteoarthritis. spinal stenosis. Scoliosis of spine-degenerative spine.   Bilateral cataracts    CAD (coronary artery disease)    minimal, improved on right 06/2016   Chronic kidney disease    STAGE 4   DDD (degenerative disc disease), lumbar    Dyspnea    Elevated cholesterol    Encounter for care of pacemaker 11/06/2021   GERD (gastroesophageal reflux disease)    controls with Nexium   Grade I diastolic dysfunction 56/38/7564   Noted on ECHO   History of cardiomegaly    History of gallstones    Hypertension    LVH (left ventricular hypertrophy) 05/04/2017   Mil, noted on ECHO   Mild depression    Pacemaker Abbott dual chamber PACEMAKER ASSURITY DR-RF 11/06/2021   Pre-diabetes    RBBB (right bundle branch block)    Spinal stenosis    Tubular adenoma    and benign polyps   Vitamin D deficiency    Wears partial dentures     Family History   Problem Relation Age of Onset   Heart disease Father    Heart failure Father    Diabetes Father    Atrial fibrillation Sister     Past Surgical History:  Procedure Laterality Date   CATARACT EXTRACTION Bilateral 03/06/2013   CHOLECYSTECTOMY     COLONOSCOPY     CRYOTHERAPY     DILATION AND CURETTAGE OF UTERUS     esi     HEMORRHOID SURGERY N/A 09/21/2018   Procedure: SINGLE COLUMN HEMORRHOIDECTOMY, HEMORRHOIDPEXY;  Surgeon: Leighton Ruff, MD;  Location: [facility];  Service: General;  Laterality: N/A;   PACEMAKER IMPLANT N/A 11/05/2021   Procedure: PACEMAKER IMPLANT;  Surgeon: Constance Haw, MD;  Location: [facility];  Service: Cardiovascular;  Laterality: N/A;   RIGHT HEART CATH N/A 11/02/2021   Procedure: RIGHT HEART CATH;  Surgeon: Adrian Prows, MD;  Location: [facility];  Service: Cardiovascular;  Laterality: N/A;   TEE WITHOUT CARDIOVERSION N/A 11/04/2021   Procedure: TRANSESOPHAGEAL ECHOCARDIOGRAM (TEE);  Surgeon: Adrian Prows, MD;  Location: [facility];  Service: Cardiovascular;  Laterality: N/A;   TEMPORARY PACEMAKER N/A 11/02/2021   Procedure: TEMPORARY PACEMAKER;  Surgeon: Adrian Prows, MD;  Location: [facility];  Service: Cardiovascular;  Laterality: N/A;   TOTAL HIP ARTHROPLASTY Left 03/08/2013   Procedure: LEFT TOTAL HIP ARTHROPLASTY ANTERIOR APPROACH;  Surgeon: Mcarthur Rossetti, MD;  Location: [facility];  Service: Orthopedics;  Laterality: Left;   Social History   Occupational History   Not on file  Tobacco Use   Smoking status: Never   Smokeless tobacco: Never  Vaping Use   Vaping Use: Never used  Substance and Sexual Activity   Alcohol use: Yes    Comment: OCC   Drug use: Never   Sexual activity: Not Currently    Birth control/protection: Post-menopausal

## 2021-11-23 ENCOUNTER — Telehealth: Payer: Self-pay | Admitting: Neurology

## 2021-11-23 DIAGNOSIS — K900 Celiac disease: Secondary | ICD-10-CM | POA: Diagnosis not present

## 2021-11-23 DIAGNOSIS — Z792 Long term (current) use of antibiotics: Secondary | ICD-10-CM | POA: Diagnosis not present

## 2021-11-23 DIAGNOSIS — I10 Essential (primary) hypertension: Secondary | ICD-10-CM | POA: Diagnosis not present

## 2021-11-23 DIAGNOSIS — N390 Urinary tract infection, site not specified: Secondary | ICD-10-CM | POA: Diagnosis not present

## 2021-11-23 DIAGNOSIS — M109 Gout, unspecified: Secondary | ICD-10-CM | POA: Diagnosis not present

## 2021-11-23 DIAGNOSIS — Z9181 History of falling: Secondary | ICD-10-CM | POA: Diagnosis not present

## 2021-11-23 DIAGNOSIS — E1140 Type 2 diabetes mellitus with diabetic neuropathy, unspecified: Secondary | ICD-10-CM | POA: Diagnosis not present

## 2021-11-23 DIAGNOSIS — E871 Hypo-osmolality and hyponatremia: Secondary | ICD-10-CM | POA: Diagnosis not present

## 2021-11-23 DIAGNOSIS — R339 Retention of urine, unspecified: Secondary | ICD-10-CM | POA: Diagnosis not present

## 2021-11-23 DIAGNOSIS — G8929 Other chronic pain: Secondary | ICD-10-CM | POA: Diagnosis not present

## 2021-11-23 DIAGNOSIS — A4189 Other specified sepsis: Secondary | ICD-10-CM | POA: Diagnosis not present

## 2021-11-23 NOTE — Telephone Encounter (Signed)
LVM to see if pt is interested in rescheduling her sleep study that she no showed.

## 2021-11-24 DIAGNOSIS — K648 Other hemorrhoids: Secondary | ICD-10-CM | POA: Diagnosis not present

## 2021-11-24 DIAGNOSIS — M170 Bilateral primary osteoarthritis of knee: Secondary | ICD-10-CM | POA: Diagnosis not present

## 2021-11-24 DIAGNOSIS — E559 Vitamin D deficiency, unspecified: Secondary | ICD-10-CM | POA: Diagnosis not present

## 2021-11-24 DIAGNOSIS — J301 Allergic rhinitis due to pollen: Secondary | ICD-10-CM | POA: Diagnosis not present

## 2021-11-24 DIAGNOSIS — E871 Hypo-osmolality and hyponatremia: Secondary | ICD-10-CM | POA: Diagnosis not present

## 2021-11-24 DIAGNOSIS — K5901 Slow transit constipation: Secondary | ICD-10-CM | POA: Diagnosis not present

## 2021-11-24 DIAGNOSIS — Z87440 Personal history of urinary (tract) infections: Secondary | ICD-10-CM | POA: Diagnosis not present

## 2021-11-24 DIAGNOSIS — Z950 Presence of cardiac pacemaker: Secondary | ICD-10-CM | POA: Diagnosis not present

## 2021-11-24 DIAGNOSIS — I110 Hypertensive heart disease with heart failure: Secondary | ICD-10-CM | POA: Diagnosis not present

## 2021-11-30 ENCOUNTER — Telehealth: Payer: Self-pay | Admitting: Neurology

## 2021-11-30 DIAGNOSIS — R339 Retention of urine, unspecified: Secondary | ICD-10-CM | POA: Diagnosis not present

## 2021-11-30 DIAGNOSIS — E1140 Type 2 diabetes mellitus with diabetic neuropathy, unspecified: Secondary | ICD-10-CM | POA: Diagnosis not present

## 2021-11-30 DIAGNOSIS — Z792 Long term (current) use of antibiotics: Secondary | ICD-10-CM | POA: Diagnosis not present

## 2021-11-30 DIAGNOSIS — A4189 Other specified sepsis: Secondary | ICD-10-CM | POA: Diagnosis not present

## 2021-11-30 DIAGNOSIS — K900 Celiac disease: Secondary | ICD-10-CM | POA: Diagnosis not present

## 2021-11-30 DIAGNOSIS — I10 Essential (primary) hypertension: Secondary | ICD-10-CM | POA: Diagnosis not present

## 2021-11-30 DIAGNOSIS — G8929 Other chronic pain: Secondary | ICD-10-CM | POA: Diagnosis not present

## 2021-11-30 DIAGNOSIS — M109 Gout, unspecified: Secondary | ICD-10-CM | POA: Diagnosis not present

## 2021-11-30 DIAGNOSIS — E871 Hypo-osmolality and hyponatremia: Secondary | ICD-10-CM | POA: Diagnosis not present

## 2021-11-30 DIAGNOSIS — Z9181 History of falling: Secondary | ICD-10-CM | POA: Diagnosis not present

## 2021-11-30 DIAGNOSIS — N390 Urinary tract infection, site not specified: Secondary | ICD-10-CM | POA: Diagnosis not present

## 2021-11-30 NOTE — Telephone Encounter (Signed)
I have tired to contact pt twice to r/s her sleep study appt. But am unable to contact.

## 2021-12-02 DIAGNOSIS — I10 Essential (primary) hypertension: Secondary | ICD-10-CM | POA: Diagnosis not present

## 2021-12-02 DIAGNOSIS — E871 Hypo-osmolality and hyponatremia: Secondary | ICD-10-CM | POA: Diagnosis not present

## 2021-12-06 ENCOUNTER — Ambulatory Visit: Payer: Medicare Other | Admitting: Student

## 2021-12-08 DIAGNOSIS — R339 Retention of urine, unspecified: Secondary | ICD-10-CM | POA: Diagnosis not present

## 2021-12-08 DIAGNOSIS — Z9181 History of falling: Secondary | ICD-10-CM | POA: Diagnosis not present

## 2021-12-08 DIAGNOSIS — E1140 Type 2 diabetes mellitus with diabetic neuropathy, unspecified: Secondary | ICD-10-CM | POA: Diagnosis not present

## 2021-12-08 DIAGNOSIS — M109 Gout, unspecified: Secondary | ICD-10-CM | POA: Diagnosis not present

## 2021-12-08 DIAGNOSIS — A4189 Other specified sepsis: Secondary | ICD-10-CM | POA: Diagnosis not present

## 2021-12-08 DIAGNOSIS — I10 Essential (primary) hypertension: Secondary | ICD-10-CM | POA: Diagnosis not present

## 2021-12-08 DIAGNOSIS — Z792 Long term (current) use of antibiotics: Secondary | ICD-10-CM | POA: Diagnosis not present

## 2021-12-08 DIAGNOSIS — K900 Celiac disease: Secondary | ICD-10-CM | POA: Diagnosis not present

## 2021-12-08 DIAGNOSIS — E871 Hypo-osmolality and hyponatremia: Secondary | ICD-10-CM | POA: Diagnosis not present

## 2021-12-08 DIAGNOSIS — G8929 Other chronic pain: Secondary | ICD-10-CM | POA: Diagnosis not present

## 2021-12-08 DIAGNOSIS — K648 Other hemorrhoids: Secondary | ICD-10-CM | POA: Diagnosis not present

## 2021-12-08 DIAGNOSIS — N390 Urinary tract infection, site not specified: Secondary | ICD-10-CM | POA: Diagnosis not present

## 2021-12-09 ENCOUNTER — Other Ambulatory Visit: Payer: Self-pay

## 2021-12-09 ENCOUNTER — Encounter: Payer: Self-pay | Admitting: Cardiology

## 2021-12-09 ENCOUNTER — Ambulatory Visit: Payer: Medicare Other | Admitting: Cardiology

## 2021-12-09 VITALS — BP 136/59 | HR 79 | Temp 97.5°F | Resp 17 | Ht <= 58 in | Wt 163.4 lb

## 2021-12-09 DIAGNOSIS — I129 Hypertensive chronic kidney disease with stage 1 through stage 4 chronic kidney disease, or unspecified chronic kidney disease: Secondary | ICD-10-CM | POA: Diagnosis not present

## 2021-12-09 DIAGNOSIS — Z950 Presence of cardiac pacemaker: Secondary | ICD-10-CM | POA: Diagnosis not present

## 2021-12-09 DIAGNOSIS — I442 Atrioventricular block, complete: Secondary | ICD-10-CM

## 2021-12-09 DIAGNOSIS — N1831 Chronic kidney disease, stage 3a: Secondary | ICD-10-CM | POA: Diagnosis not present

## 2021-12-09 DIAGNOSIS — I10 Essential (primary) hypertension: Secondary | ICD-10-CM

## 2021-12-09 NOTE — Progress Notes (Signed)
Primary Physician/Referring:  [name], MD  Patient ID: Deborah Jordan, female    DOB: Apr 28, 1933, 87 y.o.   MRN: [id number]  Chief Complaint  Patient presents with   Bradycardia   Hypertension   repeat ekg    6 weeks   HPI:    Deborah Jordan  is a 87 y.o. with hypertension, hyperlipidemia, history of chronic kidney disease, hyperglycemia, bifascicular block on EKG. Patient had complete heart block leading to dual-chamber permanent pacemaker implantation on 11/05/2021.  Her past medical history significant for chronic stage IIIb kidney disease, chronic diastolic heart failure, primary hypertension and prediabetes.  She is presently doing well and has not had any further episodes of fatigue, dizziness or syncope.  She has moved into assisted living facility at [facility] at new [facility].  Past Medical History:  Diagnosis Date   Anxiety    Arthritis    osteoarthritis. spinal stenosis. Scoliosis of spine-degenerative spine.   Bilateral cataracts    CAD (coronary artery disease)    minimal, improved on right 06/2016   Chronic kidney disease    STAGE 4   DDD (degenerative disc disease), lumbar    Dyspnea    Elevated cholesterol    Encounter for care of pacemaker 11/06/2021   GERD (gastroesophageal reflux disease)    controls with Nexium   Grade I diastolic dysfunction 80/02/4916   Noted on ECHO   History of cardiomegaly    History of gallstones    Hypertension    LVH (left ventricular hypertrophy) 05/04/2017   Mil, noted on ECHO   Mild depression    Pacemaker Abbott dual chamber PACEMAKER ASSURITY DR-RF 11/06/2021   Pre-diabetes    RBBB (right bundle branch block)    Spinal stenosis    Tubular adenoma    and benign polyps   Vitamin D deficiency    Wears partial dentures    Past Surgical History:  Procedure Laterality Date   CATARACT EXTRACTION Bilateral 03/06/2013   CHOLECYSTECTOMY     COLONOSCOPY     CRYOTHERAPY      DILATION AND CURETTAGE OF UTERUS     esi     HEMORRHOID SURGERY N/A 09/21/2018   Procedure: SINGLE COLUMN HEMORRHOIDECTOMY, HEMORRHOIDPEXY;  Surgeon: Leighton Ruff, MD;  Location: [place];  Service: General;  Laterality: N/A;   PACEMAKER IMPLANT N/A 11/05/2021   Procedure: PACEMAKER IMPLANT;  Surgeon: Constance Haw, MD;  Location: [place] [facility];  Service: Cardiovascular;  Laterality: N/A;   RIGHT HEART CATH N/A 11/02/2021   Procedure: RIGHT HEART CATH;  Surgeon: Adrian Prows, MD;  Location: [place] [facility];  Service: Cardiovascular;  Laterality: N/A;   TEE WITHOUT CARDIOVERSION N/A 11/04/2021   Procedure: TRANSESOPHAGEAL ECHOCARDIOGRAM (TEE);  Surgeon: Adrian Prows, MD;  Location: [facility] ENDOSCOPY;  Service: Cardiovascular;  Laterality: N/A;   TEMPORARY PACEMAKER N/A 11/02/2021   Procedure: TEMPORARY PACEMAKER;  Surgeon: Adrian Prows, MD;  Location: [place] [facility];  Service: Cardiovascular;  Laterality: N/A;   TOTAL HIP ARTHROPLASTY Left 03/08/2013   Procedure: LEFT TOTAL HIP ARTHROPLASTY ANTERIOR APPROACH;  Surgeon: Mcarthur Rossetti, MD;  Location: [facility];  Service: Orthopedics;  Laterality: Left;   Social History   Tobacco Use   Smoking status: Never   Smokeless tobacco: Never  Substance Use Topics   Alcohol use: Yes    Comment: OCC    ROS  Review of Systems  Constitutional: Negative for malaise/fatigue.  Cardiovascular:  Negative for chest pain, dyspnea  on exertion, leg swelling (mild) and palpitations.  Gastrointestinal:  Negative for melena.  Objective  Blood pressure (!) 136/59, pulse 79, temperature (!) 97.5 F (36.4 C), temperature source Temporal, resp. rate 17, height '4\' 9"'  (1.448 m), weight 163 lb 6.4 oz (74.1 kg), SpO2 99 %.  Vitals with BMI 12/09/2021 11/22/2021 11/11/2021  Height '4\' 9"'  '4\' 9"'  -  Weight 163 lbs 6 oz 167 lbs -  BMI 01.65 53.74 -  Systolic 827 078 675  Diastolic 59 65 61  Pulse 79 94 85     Physical  Exam Constitutional:      Comments: Short stature and moderately obese  Neck:     Vascular: No carotid bruit (Right carotid bruit) or JVD.  Cardiovascular:     Rate and Rhythm: Normal rate and regular rhythm.     Pulses:          Dorsalis pedis pulses are 1+ on the right side and 1+ on the left side.       Posterior tibial pulses are 0 on the right side and 0 on the left side.     Heart sounds: Murmur heard.  Early systolic murmur is present with a grade of 2/6 radiating to the apex.  Pulmonary:     Effort: Pulmonary effort is normal. No accessory muscle usage or respiratory distress.     Breath sounds: Normal breath sounds.  Abdominal:     General: Bowel sounds are normal.     Palpations: Abdomen is soft.  Musculoskeletal:        General: Swelling (Bilateral 1-2+ ankle edema) present. Normal range of motion.   Laboratory examination:   CMP Latest Ref Rng & Units 11/11/2021 11/10/2021 11/09/2021  Glucose 70 - 99 mg/dL 115(H) 109(H) 166(H)  BUN 8 - 23 mg/dL 66(H) 108(H) 108(H)  Creatinine 0.44 - 1.00 mg/dL 1.17(H) 1.41(H) 1.31(H)  Sodium 135 - 145 mmol/L 137 133(L) 129(L)  Potassium 3.5 - 5.1 mmol/L 4.0 3.8 3.9  Chloride 98 - 111 mmol/L 103 97(L) 95(L)  CO2 22 - 32 mmol/L '27 27 22  ' Calcium 8.9 - 10.3 mg/dL 9.7 9.8 10.0  Total Protein 6.5 - 8.1 g/dL 6.0(L) - -  Total Bilirubin 0.3 - 1.2 mg/dL 0.6 - -  Alkaline Phos 38 - 126 U/L 76 - -  AST 15 - 41 U/L 25 - -  ALT 0 - 44 U/L 14 - -   CBC Latest Ref Rng & Units 11/11/2021 11/06/2021 11/04/2021  WBC 4.0 - 10.5 K/uL 6.2 6.3 -  Hemoglobin 12.0 - 15.0 g/dL 9.9(L) 10.7(L) 8.8(L)  Hematocrit 36.0 - 46.0 % 29.2(L) 31.5(L) 26.4(L)  Platelets 150 - 400 K/uL 593(H) 491(H) -   TSH Recent Labs    06/19/21 0319 10/31/21 0919  TSH 1.872 1.355    External labs  11/05/2019 :   Labs 06/08/2020:   Serum glucose 98 mg, BUN 21, creatinine 0.98, EGFR 54 mL, sodium 135, potassium 4.6, CMP otherwise normal.  A1c 6.2%.  Hb 12.6/HCT  37.5, platelets 470.  Cholesterol, total 145.000 11/05/2019 HDL 53.000 11/05/2019 LDL 75.000 11/05/2019 Triglycerides 87.000 11/05/2019 A1C 6.100 11/05/2019 Hemoglobin 12.600 11/05/2019 Creatinine, Serum 1.050 11/05/2019 Potassium 4.800 11/05/2019 Magnesium N/D ALT (SGPT) 10.000 11/05/2019 TSH 2.240 11/05/2019  Medications and allergies   Allergies  Allergen Reactions   Ace Inhibitors Cough   Hydrochlorothiazide Other (See Comments)    Low Sodium    Current Outpatient Medications on File Prior to Visit  Medication Sig Dispense Refill   acetaminophen (  TYLENOL) 650 MG CR tablet Take 650 mg by mouth every 8 (eight) hours as needed for pain.     ALPRAZolam (XANAX) 0.25 MG tablet Take 1 tablet (0.25 mg total) by mouth 3 (three) times daily as needed for anxiety. 30 tablet 0   amLODipine (NORVASC) 10 MG tablet Take 0.5 tablets (5 mg total) by mouth daily. 15 tablet 2   aspirin EC 81 MG tablet Take 81 mg by mouth daily. Swallow whole.     atorvastatin (LIPITOR) 20 MG tablet Take 1 tablet by mouth daily.     Cholecalciferol (VITAMIN D) 2000 units CAPS Take 2,000 Units by mouth daily.     DULoxetine (CYMBALTA) 20 MG capsule Take 1 capsule (20 mg total) by mouth daily. 90 capsule 3   esomeprazole (NEXIUM) 40 MG capsule Take 40 mg by mouth daily.     gabapentin (NEURONTIN) 100 MG capsule Take 1 capsule (100 mg total) by mouth at bedtime. 90 capsule 3   loratadine (CLARITIN) 10 MG tablet Take 10 mg by mouth daily.     sacubitril-valsartan (ENTRESTO) 24-26 MG Take 1 tablet by mouth 2 (two) times daily. 60 tablet 0   sodium chloride 1 g tablet Take 2 tablets (2 g total) by mouth 3 (three) times daily with meals. 180 tablet 0   tamsulosin (FLOMAX) 0.4 MG CAPS capsule Take 1 capsule (0.4 mg total) by mouth daily after supper. 30 capsule 0   cephALEXin (KEFLEX) 250 MG capsule Take 250 mg by mouth 2 (two) times daily. (Patient not taking: Reported on 12/09/2021)     lidocaine  (XYLOCAINE) 2 % solution Use as directed 15 mLs in the mouth or throat every 4 (four) hours as needed for mouth pain. (Patient not taking: Reported on 12/09/2021) 100 mL 0   magnesium oxide (MAG-OX) 400 (240 Mg) MG tablet Take 1 tablet (400 mg total) by mouth 2 (two) times daily. (Patient not taking: Reported on 12/09/2021) 60 tablet 0   melatonin 5 MG TABS Take 1 tablet (5 mg total) by mouth at bedtime as needed. (Patient not taking: Reported on 12/09/2021) 30 tablet 0   nystatin (MYCOSTATIN) 100000 UNIT/ML suspension Take 5 mLs (500,000 Units total) by mouth 4 (four) times daily. (Patient not taking: Reported on 12/09/2021) 60 mL 0   No current facility-administered medications on file prior to visit.     Radiology:   CT angiogram of the chest and abdomen and pelvis 09/22/2021: 1. No acute cardiopulmonary disease. Specifically, no evidence of thoracic aortic aneurysm, dissection or central pulmonary embolism. 2. Coronary artery calcifications. 4. 2. Moderate to large amount of atherosclerotic plaque within a normal caliber abdominal aorta, not resulting in a hemodynamically significant stenosis. 5. 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 The IMA is diseased at its origin though remains patent, though also with collateral supply from the SMA. Presently, there is no evidence of acute mesenteric ischemia. 6. Suspected hemodynamically significant narrowing involving the origin of the left renal artery  7.  Colonic diverticulosis.  Cardiac Studies:    Echocardiogram 09/24/2021:    1. Left ventricular ejection fraction, by estimation, is 55 to 60%. The left ventricle has normal function. The left ventricle has no regional wall motion abnormalities.  2. Right ventricular systolic function is normal. The right ventricular size is normal.  3. Left atrial size was mildly dilated.  4. Mild mitral valve  regurgitation. There is moderate holosystolic prolapse of  the middle scallop of the posterior leaflet of the mitral valve.  5. The aortic valve is calcified. Aortic valve regurgitation is not visualized.   Conclusion(s)/Recommendation(s): No evidence of valvular vegetations on this transthoracic echocardiogram. Would recommend a transesophageal echocardiogram to exclude infective endocarditis if clinically indicated.  TEE 11/04/2021:  1. Left ventricular ejection fraction, by estimation, is 60 to 65%. The left ventricle has normal function. The left ventricle has no regional wall motion abnormalities.  2. Right ventricular systolic function is normal. The right ventricular size is normal.  3. Left atrial size was moderately dilated.  4. Pacer lead noted in RA.   5. The mitral valve is myxomatous. Mild mitral valve regurgitation.  6. The aortic valve is normal in structure. Aortic valve regurgitation is trivial. No aortic stenosis is present.  Carotid artery duplex 07/06/2021: Duplex suggests stenosis in the right internal carotid artery (1-15%). Duplex suggests stenosis in the left internal carotid artery (16-49%). Duplex suggests stenosis in the left external carotid artery (<50%). Antegrade right vertebral artery flow. Antegrade left vertebral artery flow. Follow up in one year is appropriate if clinically indicated.  Device Clinic  Pacemaker Abbott dual chamber PACEMAKER ASSURITY DR-RF 11/05/2021   EKG:  EKG 10/29/2021: Marked sinus bradycardia at rate of 52 bpm, left atrial enlargement, left axis deviation, left anterior fascicular block.  Right bundle branch block.  Poor R wave progression, cannot exclude anterolateral infarct old.  LVH with repolarization abnormality.  Compared to 01/26/2021, heart rate was previously 70 bpm but otherwise no change.  Assessment     ICD-10-CM   1. Heart block AV complete (HCC)  I44.2     2. Pacemaker Abbott dual chamber PACEMAKER ASSURITY DR-RF  11/05/2021  Z95.0     3. Primary hypertension  I10     4. Stage 3a chronic kidney disease (HCC)  N18.31       No orders of the defined types were placed in this encounter.   Medications Discontinued During This Encounter  Medication Reason   Cholecalciferol 50 MCG (2000 UT) CAPS      Recommendations:   Deborah Jordan  is a 87 y.o.  with hypertension, hyperlipidemia, history of chronic kidney disease, hyperglycemia, bifascicular block on EKG. Patient had complete heart block leading to dual-chamber permanent pacemaker implantation on 11/05/2021.  Her past medical history significant for chronic stage IIIb kidney disease, chronic diastolic heart failure, primary hypertension and prediabetes.  She is presently doing well and has not had any further episodes of fatigue, dizziness or syncope.  She has moved into assisted living facility at [facility] at new [facility].  Today there is no clinical evidence of heart failure.  Medications reviewed.  Continue present medications, she has an appointment to see Dr. Curt Bears in 2 months for final pacemaker check and I will continue to do remote monitoring after that.  Pacemaker site has healed well. Although 87 years of age from cardiac standpoint she is doing extremely well.   Labs reviewed, stable CBC and BMP.  Lipids are also at goal in view of mild carotid stenosis.   Adrian Prows, MD, [facility] 12/09/2021, 10:53 AM Office: [phone] Pager: [phone]

## 2021-12-09 NOTE — Patient Instructions (Signed)
Please apply petroleum jelly to both your hands and feet to avoid dry skin during winters which can cause itching.  If itching is severe, you could use Benadryl cream.

## 2021-12-15 DIAGNOSIS — Z792 Long term (current) use of antibiotics: Secondary | ICD-10-CM | POA: Diagnosis not present

## 2021-12-15 DIAGNOSIS — R339 Retention of urine, unspecified: Secondary | ICD-10-CM | POA: Diagnosis not present

## 2021-12-15 DIAGNOSIS — E871 Hypo-osmolality and hyponatremia: Secondary | ICD-10-CM | POA: Diagnosis not present

## 2021-12-15 DIAGNOSIS — K900 Celiac disease: Secondary | ICD-10-CM | POA: Diagnosis not present

## 2021-12-15 DIAGNOSIS — E1140 Type 2 diabetes mellitus with diabetic neuropathy, unspecified: Secondary | ICD-10-CM | POA: Diagnosis not present

## 2021-12-15 DIAGNOSIS — N390 Urinary tract infection, site not specified: Secondary | ICD-10-CM | POA: Diagnosis not present

## 2021-12-15 DIAGNOSIS — I10 Essential (primary) hypertension: Secondary | ICD-10-CM | POA: Diagnosis not present

## 2021-12-15 DIAGNOSIS — Z9181 History of falling: Secondary | ICD-10-CM | POA: Diagnosis not present

## 2021-12-15 DIAGNOSIS — A4189 Other specified sepsis: Secondary | ICD-10-CM | POA: Diagnosis not present

## 2021-12-15 DIAGNOSIS — M109 Gout, unspecified: Secondary | ICD-10-CM | POA: Diagnosis not present

## 2021-12-15 DIAGNOSIS — G8929 Other chronic pain: Secondary | ICD-10-CM | POA: Diagnosis not present

## 2021-12-16 DIAGNOSIS — G8929 Other chronic pain: Secondary | ICD-10-CM | POA: Diagnosis not present

## 2021-12-16 DIAGNOSIS — I5030 Unspecified diastolic (congestive) heart failure: Secondary | ICD-10-CM | POA: Diagnosis not present

## 2021-12-16 DIAGNOSIS — N1831 Chronic kidney disease, stage 3a: Secondary | ICD-10-CM | POA: Diagnosis not present

## 2021-12-16 DIAGNOSIS — K900 Celiac disease: Secondary | ICD-10-CM | POA: Diagnosis not present

## 2021-12-16 DIAGNOSIS — F32A Depression, unspecified: Secondary | ICD-10-CM | POA: Diagnosis not present

## 2021-12-16 DIAGNOSIS — I340 Nonrheumatic mitral (valve) insufficiency: Secondary | ICD-10-CM | POA: Diagnosis not present

## 2021-12-16 DIAGNOSIS — E1140 Type 2 diabetes mellitus with diabetic neuropathy, unspecified: Secondary | ICD-10-CM | POA: Diagnosis not present

## 2021-12-16 DIAGNOSIS — I2510 Atherosclerotic heart disease of native coronary artery without angina pectoris: Secondary | ICD-10-CM | POA: Diagnosis not present

## 2021-12-16 DIAGNOSIS — I130 Hypertensive heart and chronic kidney disease with heart failure and stage 1 through stage 4 chronic kidney disease, or unspecified chronic kidney disease: Secondary | ICD-10-CM | POA: Diagnosis not present

## 2021-12-16 DIAGNOSIS — M109 Gout, unspecified: Secondary | ICD-10-CM | POA: Diagnosis not present

## 2021-12-16 DIAGNOSIS — I442 Atrioventricular block, complete: Secondary | ICD-10-CM | POA: Diagnosis not present

## 2021-12-22 DIAGNOSIS — Z950 Presence of cardiac pacemaker: Secondary | ICD-10-CM | POA: Diagnosis not present

## 2021-12-22 DIAGNOSIS — Z79899 Other long term (current) drug therapy: Secondary | ICD-10-CM | POA: Diagnosis not present

## 2021-12-22 DIAGNOSIS — I110 Hypertensive heart disease with heart failure: Secondary | ICD-10-CM | POA: Diagnosis not present

## 2021-12-23 DIAGNOSIS — F32A Depression, unspecified: Secondary | ICD-10-CM | POA: Diagnosis not present

## 2021-12-23 DIAGNOSIS — I4510 Unspecified right bundle-branch block: Secondary | ICD-10-CM | POA: Diagnosis not present

## 2021-12-23 DIAGNOSIS — E440 Moderate protein-calorie malnutrition: Secondary | ICD-10-CM | POA: Diagnosis not present

## 2021-12-23 DIAGNOSIS — I130 Hypertensive heart and chronic kidney disease with heart failure and stage 1 through stage 4 chronic kidney disease, or unspecified chronic kidney disease: Secondary | ICD-10-CM | POA: Diagnosis not present

## 2021-12-23 DIAGNOSIS — Z87440 Personal history of urinary (tract) infections: Secondary | ICD-10-CM | POA: Diagnosis not present

## 2021-12-23 DIAGNOSIS — N1831 Chronic kidney disease, stage 3a: Secondary | ICD-10-CM | POA: Diagnosis not present

## 2021-12-23 DIAGNOSIS — I2510 Atherosclerotic heart disease of native coronary artery without angina pectoris: Secondary | ICD-10-CM | POA: Diagnosis not present

## 2021-12-23 DIAGNOSIS — G8929 Other chronic pain: Secondary | ICD-10-CM | POA: Diagnosis not present

## 2021-12-23 DIAGNOSIS — Z950 Presence of cardiac pacemaker: Secondary | ICD-10-CM | POA: Diagnosis not present

## 2021-12-23 DIAGNOSIS — K900 Celiac disease: Secondary | ICD-10-CM | POA: Diagnosis not present

## 2021-12-23 DIAGNOSIS — R339 Retention of urine, unspecified: Secondary | ICD-10-CM | POA: Diagnosis not present

## 2021-12-23 DIAGNOSIS — I340 Nonrheumatic mitral (valve) insufficiency: Secondary | ICD-10-CM | POA: Diagnosis not present

## 2021-12-23 DIAGNOSIS — E1140 Type 2 diabetes mellitus with diabetic neuropathy, unspecified: Secondary | ICD-10-CM | POA: Diagnosis not present

## 2021-12-23 DIAGNOSIS — Z7982 Long term (current) use of aspirin: Secondary | ICD-10-CM | POA: Diagnosis not present

## 2021-12-23 DIAGNOSIS — Z9181 History of falling: Secondary | ICD-10-CM | POA: Diagnosis not present

## 2021-12-23 DIAGNOSIS — M109 Gout, unspecified: Secondary | ICD-10-CM | POA: Diagnosis not present

## 2021-12-23 DIAGNOSIS — K648 Other hemorrhoids: Secondary | ICD-10-CM | POA: Diagnosis not present

## 2021-12-23 DIAGNOSIS — I5030 Unspecified diastolic (congestive) heart failure: Secondary | ICD-10-CM | POA: Diagnosis not present

## 2021-12-23 DIAGNOSIS — I442 Atrioventricular block, complete: Secondary | ICD-10-CM | POA: Diagnosis not present

## 2022-01-06 DIAGNOSIS — E1140 Type 2 diabetes mellitus with diabetic neuropathy, unspecified: Secondary | ICD-10-CM | POA: Diagnosis not present

## 2022-01-06 DIAGNOSIS — R339 Retention of urine, unspecified: Secondary | ICD-10-CM | POA: Diagnosis not present

## 2022-01-06 DIAGNOSIS — Z87440 Personal history of urinary (tract) infections: Secondary | ICD-10-CM | POA: Diagnosis not present

## 2022-01-06 DIAGNOSIS — I442 Atrioventricular block, complete: Secondary | ICD-10-CM | POA: Diagnosis not present

## 2022-01-06 DIAGNOSIS — Z950 Presence of cardiac pacemaker: Secondary | ICD-10-CM | POA: Diagnosis not present

## 2022-01-06 DIAGNOSIS — I4510 Unspecified right bundle-branch block: Secondary | ICD-10-CM | POA: Diagnosis not present

## 2022-01-06 DIAGNOSIS — Z9181 History of falling: Secondary | ICD-10-CM | POA: Diagnosis not present

## 2022-01-06 DIAGNOSIS — M109 Gout, unspecified: Secondary | ICD-10-CM | POA: Diagnosis not present

## 2022-01-06 DIAGNOSIS — F32A Depression, unspecified: Secondary | ICD-10-CM | POA: Diagnosis not present

## 2022-01-06 DIAGNOSIS — N1831 Chronic kidney disease, stage 3a: Secondary | ICD-10-CM | POA: Diagnosis not present

## 2022-01-06 DIAGNOSIS — E440 Moderate protein-calorie malnutrition: Secondary | ICD-10-CM | POA: Diagnosis not present

## 2022-01-06 DIAGNOSIS — I130 Hypertensive heart and chronic kidney disease with heart failure and stage 1 through stage 4 chronic kidney disease, or unspecified chronic kidney disease: Secondary | ICD-10-CM | POA: Diagnosis not present

## 2022-01-06 DIAGNOSIS — I340 Nonrheumatic mitral (valve) insufficiency: Secondary | ICD-10-CM | POA: Diagnosis not present

## 2022-01-06 DIAGNOSIS — K900 Celiac disease: Secondary | ICD-10-CM | POA: Diagnosis not present

## 2022-01-06 DIAGNOSIS — Z7982 Long term (current) use of aspirin: Secondary | ICD-10-CM | POA: Diagnosis not present

## 2022-01-06 DIAGNOSIS — I5030 Unspecified diastolic (congestive) heart failure: Secondary | ICD-10-CM | POA: Diagnosis not present

## 2022-01-06 DIAGNOSIS — I2510 Atherosclerotic heart disease of native coronary artery without angina pectoris: Secondary | ICD-10-CM | POA: Diagnosis not present

## 2022-01-06 DIAGNOSIS — G8929 Other chronic pain: Secondary | ICD-10-CM | POA: Diagnosis not present

## 2022-01-11 DIAGNOSIS — I110 Hypertensive heart disease with heart failure: Secondary | ICD-10-CM | POA: Diagnosis not present

## 2022-01-11 DIAGNOSIS — E871 Hypo-osmolality and hyponatremia: Secondary | ICD-10-CM | POA: Diagnosis not present

## 2022-01-11 DIAGNOSIS — Z87440 Personal history of urinary (tract) infections: Secondary | ICD-10-CM | POA: Diagnosis not present

## 2022-01-11 DIAGNOSIS — M1710 Unilateral primary osteoarthritis, unspecified knee: Secondary | ICD-10-CM | POA: Diagnosis not present

## 2022-01-17 DIAGNOSIS — M1710 Unilateral primary osteoarthritis, unspecified knee: Secondary | ICD-10-CM | POA: Diagnosis not present

## 2022-01-19 DIAGNOSIS — K648 Other hemorrhoids: Secondary | ICD-10-CM | POA: Diagnosis not present

## 2022-01-19 DIAGNOSIS — Z79899 Other long term (current) drug therapy: Secondary | ICD-10-CM | POA: Diagnosis not present

## 2022-01-19 DIAGNOSIS — I110 Hypertensive heart disease with heart failure: Secondary | ICD-10-CM | POA: Diagnosis not present

## 2022-01-19 DIAGNOSIS — R339 Retention of urine, unspecified: Secondary | ICD-10-CM | POA: Diagnosis not present

## 2022-01-20 DIAGNOSIS — I340 Nonrheumatic mitral (valve) insufficiency: Secondary | ICD-10-CM | POA: Diagnosis not present

## 2022-01-20 DIAGNOSIS — K900 Celiac disease: Secondary | ICD-10-CM | POA: Diagnosis not present

## 2022-01-20 DIAGNOSIS — I442 Atrioventricular block, complete: Secondary | ICD-10-CM | POA: Diagnosis not present

## 2022-01-20 DIAGNOSIS — I5030 Unspecified diastolic (congestive) heart failure: Secondary | ICD-10-CM | POA: Diagnosis not present

## 2022-01-20 DIAGNOSIS — I2510 Atherosclerotic heart disease of native coronary artery without angina pectoris: Secondary | ICD-10-CM | POA: Diagnosis not present

## 2022-01-20 DIAGNOSIS — Z950 Presence of cardiac pacemaker: Secondary | ICD-10-CM | POA: Diagnosis not present

## 2022-01-20 DIAGNOSIS — R339 Retention of urine, unspecified: Secondary | ICD-10-CM | POA: Diagnosis not present

## 2022-01-20 DIAGNOSIS — Z9181 History of falling: Secondary | ICD-10-CM | POA: Diagnosis not present

## 2022-01-20 DIAGNOSIS — N1831 Chronic kidney disease, stage 3a: Secondary | ICD-10-CM | POA: Diagnosis not present

## 2022-01-20 DIAGNOSIS — G8929 Other chronic pain: Secondary | ICD-10-CM | POA: Diagnosis not present

## 2022-01-20 DIAGNOSIS — I130 Hypertensive heart and chronic kidney disease with heart failure and stage 1 through stage 4 chronic kidney disease, or unspecified chronic kidney disease: Secondary | ICD-10-CM | POA: Diagnosis not present

## 2022-01-20 DIAGNOSIS — F32A Depression, unspecified: Secondary | ICD-10-CM | POA: Diagnosis not present

## 2022-01-20 DIAGNOSIS — Z7982 Long term (current) use of aspirin: Secondary | ICD-10-CM | POA: Diagnosis not present

## 2022-01-20 DIAGNOSIS — E440 Moderate protein-calorie malnutrition: Secondary | ICD-10-CM | POA: Diagnosis not present

## 2022-01-20 DIAGNOSIS — Z87440 Personal history of urinary (tract) infections: Secondary | ICD-10-CM | POA: Diagnosis not present

## 2022-01-20 DIAGNOSIS — E1140 Type 2 diabetes mellitus with diabetic neuropathy, unspecified: Secondary | ICD-10-CM | POA: Diagnosis not present

## 2022-01-20 DIAGNOSIS — M109 Gout, unspecified: Secondary | ICD-10-CM | POA: Diagnosis not present

## 2022-01-20 DIAGNOSIS — I4510 Unspecified right bundle-branch block: Secondary | ICD-10-CM | POA: Diagnosis not present

## 2022-01-27 DIAGNOSIS — I130 Hypertensive heart and chronic kidney disease with heart failure and stage 1 through stage 4 chronic kidney disease, or unspecified chronic kidney disease: Secondary | ICD-10-CM | POA: Diagnosis not present

## 2022-01-27 DIAGNOSIS — K900 Celiac disease: Secondary | ICD-10-CM | POA: Diagnosis not present

## 2022-01-27 DIAGNOSIS — N1831 Chronic kidney disease, stage 3a: Secondary | ICD-10-CM | POA: Diagnosis not present

## 2022-01-27 DIAGNOSIS — Z87440 Personal history of urinary (tract) infections: Secondary | ICD-10-CM | POA: Diagnosis not present

## 2022-01-27 DIAGNOSIS — E440 Moderate protein-calorie malnutrition: Secondary | ICD-10-CM | POA: Diagnosis not present

## 2022-01-27 DIAGNOSIS — Z9181 History of falling: Secondary | ICD-10-CM | POA: Diagnosis not present

## 2022-01-27 DIAGNOSIS — G8929 Other chronic pain: Secondary | ICD-10-CM | POA: Diagnosis not present

## 2022-01-27 DIAGNOSIS — R339 Retention of urine, unspecified: Secondary | ICD-10-CM | POA: Diagnosis not present

## 2022-01-27 DIAGNOSIS — Z950 Presence of cardiac pacemaker: Secondary | ICD-10-CM | POA: Diagnosis not present

## 2022-01-27 DIAGNOSIS — I5030 Unspecified diastolic (congestive) heart failure: Secondary | ICD-10-CM | POA: Diagnosis not present

## 2022-01-27 DIAGNOSIS — Z7982 Long term (current) use of aspirin: Secondary | ICD-10-CM | POA: Diagnosis not present

## 2022-01-27 DIAGNOSIS — E1140 Type 2 diabetes mellitus with diabetic neuropathy, unspecified: Secondary | ICD-10-CM | POA: Diagnosis not present

## 2022-01-27 DIAGNOSIS — I4510 Unspecified right bundle-branch block: Secondary | ICD-10-CM | POA: Diagnosis not present

## 2022-01-27 DIAGNOSIS — I2510 Atherosclerotic heart disease of native coronary artery without angina pectoris: Secondary | ICD-10-CM | POA: Diagnosis not present

## 2022-01-27 DIAGNOSIS — F32A Depression, unspecified: Secondary | ICD-10-CM | POA: Diagnosis not present

## 2022-01-27 DIAGNOSIS — I340 Nonrheumatic mitral (valve) insufficiency: Secondary | ICD-10-CM | POA: Diagnosis not present

## 2022-01-27 DIAGNOSIS — M109 Gout, unspecified: Secondary | ICD-10-CM | POA: Diagnosis not present

## 2022-01-27 DIAGNOSIS — I442 Atrioventricular block, complete: Secondary | ICD-10-CM | POA: Diagnosis not present

## 2022-02-01 DIAGNOSIS — R3915 Urgency of urination: Secondary | ICD-10-CM | POA: Diagnosis not present

## 2022-02-01 DIAGNOSIS — N3020 Other chronic cystitis without hematuria: Secondary | ICD-10-CM | POA: Diagnosis not present

## 2022-02-03 DIAGNOSIS — I10 Essential (primary) hypertension: Secondary | ICD-10-CM | POA: Diagnosis not present

## 2022-02-03 DIAGNOSIS — E871 Hypo-osmolality and hyponatremia: Secondary | ICD-10-CM | POA: Diagnosis not present

## 2022-02-15 DIAGNOSIS — E871 Hypo-osmolality and hyponatremia: Secondary | ICD-10-CM | POA: Diagnosis not present

## 2022-02-15 DIAGNOSIS — D649 Anemia, unspecified: Secondary | ICD-10-CM | POA: Diagnosis not present

## 2022-02-15 DIAGNOSIS — I10 Essential (primary) hypertension: Secondary | ICD-10-CM | POA: Diagnosis not present

## 2022-02-15 DIAGNOSIS — R21 Rash and other nonspecific skin eruption: Secondary | ICD-10-CM | POA: Diagnosis not present

## 2022-02-16 DIAGNOSIS — Z87440 Personal history of urinary (tract) infections: Secondary | ICD-10-CM | POA: Diagnosis not present

## 2022-02-16 DIAGNOSIS — E871 Hypo-osmolality and hyponatremia: Secondary | ICD-10-CM | POA: Diagnosis not present

## 2022-02-16 DIAGNOSIS — I110 Hypertensive heart disease with heart failure: Secondary | ICD-10-CM | POA: Diagnosis not present

## 2022-02-16 DIAGNOSIS — Z79899 Other long term (current) drug therapy: Secondary | ICD-10-CM | POA: Diagnosis not present

## 2022-02-21 ENCOUNTER — Encounter: Payer: Self-pay | Admitting: Cardiology

## 2022-02-21 ENCOUNTER — Ambulatory Visit (INDEPENDENT_AMBULATORY_CARE_PROVIDER_SITE_OTHER): Payer: Medicare Other | Admitting: Cardiology

## 2022-02-21 ENCOUNTER — Other Ambulatory Visit: Payer: Self-pay

## 2022-02-21 VITALS — BP 120/68 | HR 106 | Ht <= 58 in | Wt 164.4 lb

## 2022-02-21 DIAGNOSIS — I442 Atrioventricular block, complete: Secondary | ICD-10-CM

## 2022-02-21 NOTE — Progress Notes (Signed)
Electrophysiology Office Note   Date:  02/22/2022   ID:  Deborah Jordan, DOB March 04, 1933, MRN [id number]  PCP:  [name], MD  Cardiologist:  Einar Gip Primary Electrophysiologist:  Keyandre Pileggi Meredith Leeds, MD    Chief Complaint: heart block   History of Present Illness: Deborah Jordan is a 88 y.o. female who is being seen today for the evaluation of heart block at the request of [name], MD. Presenting today for electrophysiology evaluation.  She has a history significant for hypertension, hyperlipidemia, CKD, complete heart block.  She is status post [employer]ley dual-chamber pacemaker implant 11/05/2021.  Today, she denies symptoms of palpitations, chest pain, shortness of breath, orthopnea, PND, lower extremity edema, claudication, dizziness, presyncope, syncope, bleeding, or neurologic sequela. The patient is tolerating medications without difficulties.    Past Medical History:  Diagnosis Date   Anxiety    Arthritis    osteoarthritis. spinal stenosis. Scoliosis of spine-degenerative spine.   Bilateral cataracts    CAD (coronary artery disease)    minimal, improved on right 06/2016   Chronic kidney disease    STAGE 4   DDD (degenerative disc disease), lumbar    Dyspnea    Elevated cholesterol    Encounter for care of pacemaker 11/06/2021   GERD (gastroesophageal reflux disease)    controls with Nexium   Grade I diastolic dysfunction 65/79/0383   Noted on ECHO   History of cardiomegaly    History of gallstones    Hypertension    LVH (left ventricular hypertrophy) 05/04/2017   Mil, noted on ECHO   Mild depression    Pacemaker Abbott dual chamber PACEMAKER ASSURITY DR-RF 11/06/2021   Pre-diabetes    RBBB (right bundle branch block)    Spinal stenosis    Tubular adenoma    and benign polyps   Vitamin D deficiency    Wears partial dentures    Past Surgical History:  Procedure Laterality Date   CATARACT EXTRACTION Bilateral 03/06/2013   CHOLECYSTECTOMY      COLONOSCOPY     CRYOTHERAPY     DILATION AND CURETTAGE OF UTERUS     esi     HEMORRHOID SURGERY N/A 09/21/2018   Procedure: SINGLE COLUMN HEMORRHOIDECTOMY, HEMORRHOIDPEXY;  Surgeon: Leighton Ruff, MD;  Location: [place];  Service: General;  Laterality: N/A;   PACEMAKER IMPLANT N/A 11/05/2021   Procedure: PACEMAKER IMPLANT;  Surgeon: Constance Haw, MD;  Location: [place] [facility];  Service: Cardiovascular;  Laterality: N/A;   RIGHT HEART CATH N/A 11/02/2021   Procedure: RIGHT HEART CATH;  Surgeon: Adrian Prows, MD;  Location: [place] [facility];  Service: Cardiovascular;  Laterality: N/A;   TEE WITHOUT CARDIOVERSION N/A 11/04/2021   Procedure: TRANSESOPHAGEAL ECHOCARDIOGRAM (TEE);  Surgeon: Adrian Prows, MD;  Location: [facility] ENDOSCOPY;  Service: Cardiovascular;  Laterality: N/A;   TEMPORARY PACEMAKER N/A 11/02/2021   Procedure: TEMPORARY PACEMAKER;  Surgeon: Adrian Prows, MD;  Location: [place] [facility];  Service: Cardiovascular;  Laterality: N/A;   TOTAL HIP ARTHROPLASTY Left 03/08/2013   Procedure: LEFT TOTAL HIP ARTHROPLASTY ANTERIOR APPROACH;  Surgeon: Mcarthur Rossetti, MD;  Location: [facility];  Service: Orthopedics;  Laterality: Left;     Current Outpatient Medications  Medication Sig Dispense Refill   acetaminophen (TYLENOL) 650 MG CR tablet Take 650 mg by mouth every 8 (eight) hours as needed for pain.     ALPRAZolam (XANAX) 0.25 MG tablet Take 1 tablet (0.25 mg total) by mouth 3 (three) times daily  as needed for anxiety. 30 tablet 0   aspirin EC 81 MG tablet Take 81 mg by mouth daily. Swallow whole.     Cholecalciferol (VITAMIN D) 2000 units CAPS Take 2,000 Units by mouth daily.     DULoxetine (CYMBALTA) 20 MG capsule Take 1 capsule (20 mg total) by mouth daily. 90 capsule 3   esomeprazole (NEXIUM) 40 MG capsule Take 40 mg by mouth daily.     gabapentin (NEURONTIN) 100 MG capsule Take 1 capsule (100 mg total) by mouth at bedtime. 90 capsule 3   loratadine  (CLARITIN) 10 MG tablet Take 10 mg by mouth daily.     sacubitril-valsartan (ENTRESTO) 24-26 MG Take 1 tablet by mouth 2 (two) times daily. 60 tablet 0   sodium chloride 1 g tablet Take 2 tablets (2 g total) by mouth 3 (three) times daily with meals. 180 tablet 0   amLODipine (NORVASC) 10 MG tablet Take 0.5 tablets (5 mg total) by mouth daily. 15 tablet 2   atorvastatin (LIPITOR) 20 MG tablet Take 1 tablet by mouth daily.     cephALEXin (KEFLEX) 250 MG capsule Take 250 mg by mouth 2 (two) times daily. (Patient not taking: Reported on 12/09/2021)     lidocaine (XYLOCAINE) 2 % solution Use as directed 15 mLs in the mouth or throat every 4 (four) hours as needed for mouth pain. (Patient not taking: Reported on 12/09/2021) 100 mL 0   magnesium oxide (MAG-OX) 400 (240 Mg) MG tablet Take 1 tablet (400 mg total) by mouth 2 (two) times daily. (Patient not taking: Reported on 12/09/2021) 60 tablet 0   melatonin 5 MG TABS Take 1 tablet (5 mg total) by mouth at bedtime as needed. (Patient not taking: Reported on 12/09/2021) 30 tablet 0   nystatin (MYCOSTATIN) 100000 UNIT/ML suspension Take 5 mLs (500,000 Units total) by mouth 4 (four) times daily. (Patient not taking: Reported on 12/09/2021) 60 mL 0   No current facility-administered medications for this visit.    Allergies:   Ace inhibitors and Hydrochlorothiazide   Social History:  The patient  reports that she has never smoked. She has never used smokeless tobacco. She reports current alcohol use. She reports that she does not use drugs.   Family History:  The patient's family history includes Atrial fibrillation in her sister; Diabetes in her father; Heart disease in her father; Heart failure in her father.    ROS:  Please see the history of present illness.   Otherwise, review of systems is positive for none.   All other systems are reviewed and negative.    PHYSICAL EXAM: VS:  BP 120/68    Pulse (!) 106    Ht 4' 9.5" (1.461 m)    Wt 164 lb 6.4 oz  (74.6 kg)    SpO2 97%    BMI 34.96 kg/m  , BMI Body mass index is 34.96 kg/m. GEN: Well nourished, well developed, in no acute distress  HEENT: normal  Neck: no JVD, carotid bruits, or masses Cardiac: RRR; no murmurs, rubs, or gallops,no edema  Respiratory:  clear to auscultation bilaterally, normal work of breathing GI: soft, nontender, nondistended, + BS MS: no deformity or atrophy  Skin: warm and dry, device pocket is well healed Neuro:  Strength and sensation are intact Psych: euthymic mood, full affect  EKG:  EKG is ordered today. Personal review of the ekg ordered shows A sense, V paced  Device interrogation is reviewed today in detail.  See PaceArt for details.  Recent Labs: 10/31/2021: TSH 1.355 11/04/2021: B Natriuretic Peptide 512.9 11/11/2021: ALT 14; BUN 66; Creatinine, Ser 1.17; Hemoglobin 9.9; Magnesium 2.1; Platelets 593; Potassium 4.0; Sodium 137    Lipid Panel  No results found for: CHOL, TRIG, HDL, CHOLHDL, VLDL, LDLCALC, LDLDIRECT   Wt Readings from Last 3 Encounters:  02/21/22 164 lb 6.4 oz (74.6 kg)  12/09/21 163 lb 6.4 oz (74.1 kg)  11/22/21 167 lb (75.8 kg)      Other studies Reviewed: Additional studies/ records that were reviewed today include: TTE 11/02/21  Review of the above records today demonstrates:   1. Left ventricular ejection fraction, by estimation, is 65 to 70%. The  left ventricle has normal function. Left ventricular endocardial border  not optimally defined to evaluate regional wall motion. There is mild  concentric left ventricular  hypertrophy. Left ventricular diastolic parameters are indeterminate.   2. Right ventricular systolic function is normal. The right ventricular  size is normal.   3. Left atrial size was severely dilated.   4. The mitral valve is myxomatous. Mild mitral valve regurgitation. There  is moderate late systolic prolapse of both leaflets of the mitral valve.   5. Elevated velocity due to hyperdynamic  LVEF. The aortic valve is normal  in structure. Aortic valve regurgitation is not visualized. No aortic  stenosis is present.   6. The inferior vena cava is dilated in size with <50% respiratory  variability, suggesting right atrial pressure of 15 mmHg.    ASSESSMENT AND PLAN:  1.  Complete heart block: Status post Abbott dual-chamber pacemaker implanted 11/05/2021.  Device functioning appropriately.  No changes at this time.  The patient does state that she wishes for me to follow her remotes.  2.  Hypertension: well controlled  3.  CKD stage IIIa: Creatinine has remained stable.  Plan per primary physician.    Current medicines are reviewed at length with the patient today.   The patient does not have concerns regarding her medicines.  The following changes were made today:  none  Labs/ tests ordered today include:  Orders Placed This Encounter  Procedures   EKG 12-Lead     Disposition:   FU with Kayvon Mo 9 months  Signed, Maynard David Meredith Leeds, MD  02/22/2022 7:58 AM     [address] [place] [address] [phone] (office) [phone] (fax)

## 2022-02-21 NOTE — Patient Instructions (Signed)
Medication Instructions:  Your physician recommends that you continue on your current medications as directed. Please refer to the Current Medication list given to you today.  *If you need a refill on your cardiac medications before your next appointment, please call your pharmacy*   Lab Work: None ordered   Testing/Procedures: None ordered   Follow-Up: At [facility], you and your health needs are our priority.  As part of our continuing mission to provide you with exceptional heart care, we have created designated Provider Care Teams.  These Care Teams include your primary Cardiologist (physician) and Advanced Practice Providers (APPs -  Physician Assistants and Nurse Practitioners) who all work together to provide you with the care you need, when you need it.  Your next appointment:   9 month(s)  The format for your next appointment:   In Person  Provider:   Will Camnitz, MD    Thank you for choosing [facility]!!   Shakim Faith, RN [phone]       

## 2022-02-22 DIAGNOSIS — Z79899 Other long term (current) drug therapy: Secondary | ICD-10-CM | POA: Diagnosis not present

## 2022-02-22 DIAGNOSIS — E559 Vitamin D deficiency, unspecified: Secondary | ICD-10-CM | POA: Diagnosis not present

## 2022-02-23 DIAGNOSIS — M179 Osteoarthritis of knee, unspecified: Secondary | ICD-10-CM | POA: Diagnosis not present

## 2022-02-23 DIAGNOSIS — G629 Polyneuropathy, unspecified: Secondary | ICD-10-CM | POA: Diagnosis not present

## 2022-03-18 DIAGNOSIS — K219 Gastro-esophageal reflux disease without esophagitis: Secondary | ICD-10-CM | POA: Diagnosis not present

## 2022-03-18 DIAGNOSIS — N1831 Chronic kidney disease, stage 3a: Secondary | ICD-10-CM | POA: Diagnosis not present

## 2022-03-18 DIAGNOSIS — I5042 Chronic combined systolic (congestive) and diastolic (congestive) heart failure: Secondary | ICD-10-CM | POA: Diagnosis not present

## 2022-03-18 DIAGNOSIS — I129 Hypertensive chronic kidney disease with stage 1 through stage 4 chronic kidney disease, or unspecified chronic kidney disease: Secondary | ICD-10-CM | POA: Diagnosis not present

## 2022-03-18 DIAGNOSIS — E785 Hyperlipidemia, unspecified: Secondary | ICD-10-CM | POA: Diagnosis not present

## 2022-04-06 DIAGNOSIS — E871 Hypo-osmolality and hyponatremia: Secondary | ICD-10-CM | POA: Diagnosis not present

## 2022-04-06 DIAGNOSIS — I110 Hypertensive heart disease with heart failure: Secondary | ICD-10-CM | POA: Diagnosis not present

## 2022-04-06 DIAGNOSIS — E559 Vitamin D deficiency, unspecified: Secondary | ICD-10-CM | POA: Diagnosis not present

## 2022-04-07 DIAGNOSIS — G629 Polyneuropathy, unspecified: Secondary | ICD-10-CM | POA: Diagnosis not present

## 2022-04-25 DIAGNOSIS — I509 Heart failure, unspecified: Secondary | ICD-10-CM | POA: Diagnosis not present

## 2022-04-25 DIAGNOSIS — E871 Hypo-osmolality and hyponatremia: Secondary | ICD-10-CM | POA: Diagnosis not present

## 2022-04-25 DIAGNOSIS — I110 Hypertensive heart disease with heart failure: Secondary | ICD-10-CM | POA: Diagnosis not present

## 2022-05-04 DIAGNOSIS — F5101 Primary insomnia: Secondary | ICD-10-CM | POA: Diagnosis not present

## 2022-05-04 DIAGNOSIS — I110 Hypertensive heart disease with heart failure: Secondary | ICD-10-CM | POA: Diagnosis not present

## 2022-05-10 DIAGNOSIS — E785 Hyperlipidemia, unspecified: Secondary | ICD-10-CM | POA: Diagnosis not present

## 2022-05-10 DIAGNOSIS — I5042 Chronic combined systolic (congestive) and diastolic (congestive) heart failure: Secondary | ICD-10-CM | POA: Diagnosis not present

## 2022-05-10 DIAGNOSIS — M810 Age-related osteoporosis without current pathological fracture: Secondary | ICD-10-CM | POA: Diagnosis not present

## 2022-05-10 DIAGNOSIS — I10 Essential (primary) hypertension: Secondary | ICD-10-CM | POA: Diagnosis not present

## 2022-05-10 DIAGNOSIS — K219 Gastro-esophageal reflux disease without esophagitis: Secondary | ICD-10-CM | POA: Diagnosis not present

## 2022-05-10 DIAGNOSIS — N1831 Chronic kidney disease, stage 3a: Secondary | ICD-10-CM | POA: Diagnosis not present

## 2022-05-23 ENCOUNTER — Ambulatory Visit: Payer: Medicare Other | Admitting: Specialist

## 2022-05-25 ENCOUNTER — Ambulatory Visit: Payer: Medicare Other | Admitting: Specialist

## 2022-05-25 DIAGNOSIS — I110 Hypertensive heart disease with heart failure: Secondary | ICD-10-CM | POA: Diagnosis not present

## 2022-05-25 DIAGNOSIS — K219 Gastro-esophageal reflux disease without esophagitis: Secondary | ICD-10-CM | POA: Diagnosis not present

## 2022-06-01 DIAGNOSIS — E871 Hypo-osmolality and hyponatremia: Secondary | ICD-10-CM | POA: Diagnosis not present

## 2022-06-01 DIAGNOSIS — G629 Polyneuropathy, unspecified: Secondary | ICD-10-CM | POA: Diagnosis not present

## 2022-06-01 DIAGNOSIS — R339 Retention of urine, unspecified: Secondary | ICD-10-CM | POA: Diagnosis not present

## 2022-06-01 DIAGNOSIS — I110 Hypertensive heart disease with heart failure: Secondary | ICD-10-CM | POA: Diagnosis not present

## 2022-06-07 ENCOUNTER — Other Ambulatory Visit: Payer: Medicare Other

## 2022-06-08 DIAGNOSIS — I442 Atrioventricular block, complete: Secondary | ICD-10-CM | POA: Diagnosis not present

## 2022-06-08 DIAGNOSIS — Z45018 Encounter for adjustment and management of other part of cardiac pacemaker: Secondary | ICD-10-CM | POA: Diagnosis not present

## 2022-06-09 ENCOUNTER — Telehealth: Payer: Self-pay

## 2022-06-09 NOTE — Telephone Encounter (Signed)
Called patient regarding the her device and need to know who she wants to follow her. [facility] or [facility]. She did not answer, I left a message for her to call back with decision.

## 2022-06-10 ENCOUNTER — Ambulatory Visit: Payer: Medicare Other

## 2022-06-10 DIAGNOSIS — G459 Transient cerebral ischemic attack, unspecified: Secondary | ICD-10-CM

## 2022-06-10 DIAGNOSIS — I6521 Occlusion and stenosis of right carotid artery: Secondary | ICD-10-CM

## 2022-06-10 NOTE — Telephone Encounter (Signed)
Called and spoke with patient, and they would like to see [facility] for both office and pacemaker visits. I will request a transfer from [facility].

## 2022-06-15 ENCOUNTER — Encounter: Payer: Self-pay | Admitting: Cardiology

## 2022-06-15 ENCOUNTER — Ambulatory Visit: Payer: Medicare Other | Admitting: Cardiology

## 2022-06-15 VITALS — BP 135/58 | HR 90 | Temp 98.0°F | Resp 16 | Ht <= 58 in | Wt 173.0 lb

## 2022-06-15 DIAGNOSIS — I2510 Atherosclerotic heart disease of native coronary artery without angina pectoris: Secondary | ICD-10-CM

## 2022-06-15 DIAGNOSIS — I442 Atrioventricular block, complete: Secondary | ICD-10-CM

## 2022-06-15 DIAGNOSIS — I10 Essential (primary) hypertension: Secondary | ICD-10-CM | POA: Diagnosis not present

## 2022-06-15 DIAGNOSIS — Z952 Presence of prosthetic heart valve: Secondary | ICD-10-CM | POA: Diagnosis not present

## 2022-06-15 DIAGNOSIS — I6522 Occlusion and stenosis of left carotid artery: Secondary | ICD-10-CM

## 2022-06-15 DIAGNOSIS — Z950 Presence of cardiac pacemaker: Secondary | ICD-10-CM

## 2022-06-15 NOTE — Progress Notes (Unsigned)
Primary Physician/Referring:  [name], MD  Patient ID: Deborah Jordan, female    DOB: [birth year], 88 y.o.   MRN: [id number]  Chief Complaint  Patient presents with   Hypertension   Heart block AV complete   Follow-up    6 month   HPI:    Deborah Jordan  is a 88 y.o. with hypertension, hyperlipidemia, history of chronic kidney disease, hyperglycemia, bifascicular block on EKG. Patient had complete heart block leading to dual-chamber permanent pacemaker implantation on 11/05/2021.  Her past medical history significant for chronic stage IIIb kidney disease, chronic diastolic heart failure, primary hypertension and prediabetes.  She is presently doing well and has not had any further episodes of fatigue, dizziness or syncope.  She has moved into assisted living facility at [facility] at new [facility].  Past Medical History:  Diagnosis Date   Anxiety    Arthritis    osteoarthritis. spinal stenosis. Scoliosis of spine-degenerative spine.   Bilateral cataracts    CAD (coronary artery disease)    minimal, improved on right 06/2016   Chronic kidney disease    STAGE 4   DDD (degenerative disc disease), lumbar    Dyspnea    Elevated cholesterol    Encounter for care of pacemaker 11/06/2021   GERD (gastroesophageal reflux disease)    controls with Nexium   Grade I diastolic dysfunction 68/10/5725   Noted on ECHO   History of cardiomegaly    History of gallstones    Hypertension    LVH (left ventricular hypertrophy) 05/04/2017   Mil, noted on ECHO   Mild depression    Pacemaker Abbott dual chamber PACEMAKER ASSURITY DR-RF 11/06/2021   Pre-diabetes    RBBB (right bundle branch block)    Spinal stenosis    Tubular adenoma    and benign polyps   Vitamin D deficiency    Wears partial dentures    Past Surgical History:  Procedure Laterality Date   CATARACT EXTRACTION Bilateral 03/06/2013   CHOLECYSTECTOMY     COLONOSCOPY     CRYOTHERAPY     DILATION AND CURETTAGE OF  UTERUS     esi     HEMORRHOID SURGERY N/A 09/21/2018   Procedure: SINGLE COLUMN HEMORRHOIDECTOMY, HEMORRHOIDPEXY;  Surgeon: Leighton Ruff, MD;  Location: [place];  Service: General;  Laterality: N/A;   PACEMAKER IMPLANT N/A 11/05/2021   Procedure: PACEMAKER IMPLANT;  Surgeon: Constance Haw, MD;  Location: [place] [facility];  Service: Cardiovascular;  Laterality: N/A;   RIGHT HEART CATH N/A 11/02/2021   Procedure: RIGHT HEART CATH;  Surgeon: Adrian Prows, MD;  Location: [place] [facility];  Service: Cardiovascular;  Laterality: N/A;   TEE WITHOUT CARDIOVERSION N/A 11/04/2021   Procedure: TRANSESOPHAGEAL ECHOCARDIOGRAM (TEE);  Surgeon: Adrian Prows, MD;  Location[facility];  Service: Cardiovascular;  Laterality: N/A;   TEMPORARY PACEMAKER N/A 11/02/2021   Procedure: TEMPORARY PACEMAKER;  Surgeon: Adrian Prows, MD;  Location: [place] [facility];  Service: Cardiovascular;  Laterality: N/A;   TOTAL HIP ARTHROPLASTY Left 03/08/2013   Procedure: LEFT TOTAL HIP ARTHROPLASTY ANTERIOR APPROACH;  Surgeon: Mcarthur Rossetti, MD;  Location: [facility];  Service: Orthopedics;  Laterality: Left;   Social History   Tobacco Use   Smoking status: Never   Smokeless tobacco: Never  Substance Use Topics   Alcohol use: Yes    Comment: OCC    ROS  Review of Systems  Constitutional: Negative for malaise/fatigue.  Cardiovascular:  Negative for chest  pain, dyspnea on exertion, leg swelling (mild) and palpitations.  Gastrointestinal:  Negative for melena.   Objective  Blood pressure (!) 135/58, pulse 90, temperature 98 F (36.7 C), resp. rate 16, height '4\' 9"'  (1.448 m), weight 173 lb (78.5 kg), SpO2 96 %.     06/15/2022    2:07 PM 02/21/2022    4:27 PM 12/09/2021    9:54 AM  Vitals with BMI  Height '4\' 9"'  4' 9.5" '4\' 9"'   Weight 173 lbs 164 lbs 6 oz 163 lbs 6 oz  BMI 37.43 54.65 68.12  Systolic 751 700 174  Diastolic 58 68 59  Pulse 90 106 79     Physical  Exam Constitutional:      Comments: Short stature and moderately obese  Neck:     Vascular: No carotid bruit (Right carotid bruit) or JVD.  Cardiovascular:     Rate and Rhythm: Normal rate and regular rhythm.     Pulses:          Dorsalis pedis pulses are 1+ on the right side and 1+ on the left side.       Posterior tibial pulses are 0 on the right side and 0 on the left side.     Heart sounds: Murmur heard.     Early systolic murmur is present with a grade of 2/6 radiating to the apex.  Pulmonary:     Effort: Pulmonary effort is normal. No accessory muscle usage or respiratory distress.     Breath sounds: Normal breath sounds.  Abdominal:     General: Bowel sounds are normal.     Palpations: Abdomen is soft.  Musculoskeletal:        General: Swelling (Bilateral 1-2+ ankle edema) present. Normal range of motion.    Laboratory examination:      Latest Ref Rng & Units 11/11/2021    1:00 AM 11/10/2021   12:58 AM 11/09/2021    8:47 AM  CMP  Glucose 70 - 99 mg/dL 115  109  166   BUN 8 - 23 mg/dL 66  108  108   Creatinine 0.44 - 1.00 mg/dL 1.17  1.41  1.31   Sodium 135 - 145 mmol/L 137  133  129   Potassium 3.5 - 5.1 mmol/L 4.0  3.8  3.9   Chloride 98 - 111 mmol/L 103  97  95   CO2 22 - 32 mmol/L '27  27  22   ' Calcium 8.9 - 10.3 mg/dL 9.7  9.8  10.0   Total Protein 6.5 - 8.1 g/dL 6.0     Total Bilirubin 0.3 - 1.2 mg/dL 0.6     Alkaline Phos 38 - 126 U/L 76     AST 15 - 41 U/L 25     ALT 0 - 44 U/L 14         Latest Ref Rng & Units 11/11/2021    1:00 AM 11/06/2021    6:13 AM 11/04/2021    7:31 PM  CBC  WBC 4.0 - 10.5 K/uL 6.2  6.3    Hemoglobin 12.0 - 15.0 g/dL 9.9  10.7  8.8   Hematocrit 36.0 - 46.0 % 29.2  31.5  26.4   Platelets 150 - 400 K/uL 593  491     TSH Recent Labs    06/19/21 0319 10/31/21 0919  TSH 1.872 1.355      External labs    Cholesterol, total 139.000 m 05/12/2021 HDL 57.000 mg 05/12/2021 LDL 70.000 mg 05/12/2021  Triglycerides 64.000 mg  05/12/2021  A1C 6.000 % 05/12/2021  Labs 06/08/2020:   Serum glucose 98 mg, BUN 21, creatinine 0.98, EGFR 54 mL, sodium 135, potassium 4.6, CMP otherwise normal.  A1c 6.2%.  Hb 12.6/HCT 37.5, platelets 470. A1C 6.100 11/05/2019  Medications and allergies   Allergies  Allergen Reactions   Ace Inhibitors Cough   Hydrochlorothiazide Other (See Comments)    Low Sodium    Current Outpatient Medications:    acetaminophen (TYLENOL) 650 MG CR tablet, Take 650 mg by mouth every 8 (eight) hours as needed for pain., Disp: , Rfl:    ALPRAZolam (XANAX) 0.25 MG tablet, Take 1 tablet (0.25 mg total) by mouth 3 (three) times daily as needed for anxiety., Disp: 30 tablet, Rfl: 0   amLODipine (NORVASC) 10 MG tablet, Take 0.5 tablets (5 mg total) by mouth daily., Disp: 15 tablet, Rfl: 2   aspirin EC 81 MG tablet, Take 81 mg by mouth daily. Swallow whole., Disp: , Rfl:    atorvastatin (LIPITOR) 20 MG tablet, Take 1 tablet by mouth daily., Disp: , Rfl:    Cholecalciferol (VITAMIN D) 2000 units CAPS, Take 2,000 Units by mouth daily., Disp: , Rfl:    DULoxetine (CYMBALTA) 20 MG capsule, Take 1 capsule (20 mg total) by mouth daily., Disp: 90 capsule, Rfl: 3   esomeprazole (NEXIUM) 40 MG capsule, Take 40 mg by mouth daily., Disp: , Rfl:    gabapentin (NEURONTIN) 100 MG capsule, Take 1 capsule (100 mg total) by mouth at bedtime., Disp: 90 capsule, Rfl: 3   loratadine (CLARITIN) 10 MG tablet, Take 10 mg by mouth daily., Disp: , Rfl:    magnesium oxide (MAG-OX) 400 (240 Mg) MG tablet, Take 1 tablet (400 mg total) by mouth 2 (two) times daily., Disp: 60 tablet, Rfl: 0   sacubitril-valsartan (ENTRESTO) 24-26 MG, Take 1 tablet by mouth 2 (two) times daily., Disp: 60 tablet, Rfl: 0   sodium chloride 1 g tablet, Take 2 tablets (2 g total) by mouth 3 (three) times daily with meals., Disp: 180 tablet, Rfl: 0   tamsulosin (FLOMAX) 0.4 MG CAPS capsule, Take 0.4 mg by mouth., Disp: , Rfl:      Radiology:   CT  angiogram of the chest and abdomen and pelvis 09/22/2021: 1. No acute cardiopulmonary disease. Specifically, no evidence of thoracic aortic aneurysm, dissection or central pulmonary embolism. 2. Coronary artery calcifications. 4. 2. Moderate to large amount of atherosclerotic plaque within a normal caliber abdominal aorta, not resulting in a hemodynamically significant stenosis. 5. 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 The IMA is diseased at its origin though remains patent, though also with collateral supply from the SMA. Presently, there is no evidence of acute mesenteric ischemia. 6. Suspected hemodynamically significant narrowing involving the origin of the left renal artery  7.  Colonic diverticulosis.  Cardiac Studies:    Echocardiogram 09/24/2021:    1. Left ventricular ejection fraction, by estimation, is 55 to 60%. The left ventricle has normal function. The left ventricle has no regional wall motion abnormalities.  2. Right ventricular systolic function is normal. The right ventricular size is normal.  3. Left atrial size was mildly dilated.  4. Mild mitral valve regurgitation. There is moderate holosystolic prolapse of the middle scallop of the posterior leaflet of the mitral valve.  5. The aortic valve is calcified. Aortic valve regurgitation is not visualized.   Conclusion(s)/Recommendation(s): No evidence of  valvular vegetations on this transthoracic echocardiogram. Would recommend a transesophageal echocardiogram to exclude infective endocarditis if clinically indicated.  TEE 11/04/2021:  1. Left ventricular ejection fraction, by estimation, is 60 to 65%. The left ventricle has normal function. The left ventricle has no regional wall motion abnormalities.  2. Right ventricular systolic function is normal. The right ventricular size is normal.  3. Left atrial size was moderately dilated.  4.  Pacer lead noted in RA.   5. The mitral valve is myxomatous. Mild mitral valve regurgitation.  6. The aortic valve is normal in structure. Aortic valve regurgitation is trivial. No aortic stenosis is present.  Carotid artery duplex 06/10/2022: Duplex suggests stenosis in the right internal carotid artery (1-15%). Duplex suggests stenosis in the left internal carotid artery (16-49%). Duplex suggests stenosis in the left external carotid artery (<50%). Antegrade right vertebral artery flow. Antegrade left vertebral artery flow. No significant change from 07/06/2021. Follow up in one year is appropriate if clinically indicated.  Device Clinic  Pacemaker Abbott dual chamber PACEMAKER ASSURITY DR-RF 11/05/2021   Remote dual-chamber pacemaker transmission 06/03/2022: AP 6%, VP >99%. Longevity 8.1-8.6 years. Lead impedance and thresholds within normal limits. There are brief AMS episodes, brief atrial tachycardia.  EKG:  EKG 06/15/2022: Underlying sinus rhythm with first-degree block at rate of 84 bpm, ventricularly paced rhythm.  No further analysis.  EKG 10/29/2021: Marked sinus bradycardia at rate of 52 bpm, left atrial enlargement, left axis deviation, left anterior fascicular block.  Right bundle branch block.  Poor R wave progression, cannot exclude anterolateral infarct old.  LVH with repolarization abnormality.  Compared to 01/26/2021, heart rate was previously 70 bpm but otherwise no change.  Assessment     ICD-10-CM   1. Coronary artery calcification seen on CAT scan  I25.10     2. Asymptomatic stenosis of left carotid artery  I65.22     3. Primary hypertension  I10 EKG 12-Lead    4. Complete AV block (HCC)  I44.2     5. Pacemaker Abbott dual chamber PACEMAKER ASSURITY DR-RF 11/05/2021  Z95.0       No orders of the defined types were placed in this encounter.   Medications Discontinued During This Encounter  Medication Reason   cephALEXin (KEFLEX) 250 MG capsule    lidocaine  (XYLOCAINE) 2 % solution    nystatin (MYCOSTATIN) 100000 UNIT/ML suspension    melatonin 5 MG TABS      Recommendations:   Deborah Jordan  is a 88 y.o.  with hypertension, hyperlipidemia, history of chronic kidney disease, hyperglycemia, bifascicular block on EKG and eventually developed complete heart block leading to dual-chamber permanent pacemaker implantation on 11/05/2021.  Her past medical history significant for chronic stage IIIb kidney disease, chronic diastolic heart failure, primary hypertension and prediabetes.  She is presently doing well and has not had any further episodes of fatigue, dizziness or syncope.  She has moved into assisted living facility at [facility] at new [facility].  Today there is no clinical evidence of heart failure.  Medications reviewed.  Continue present medications, blood pressure is well controlled and a year ago her lipids were normal.  She has a appointment coming up with Dr. Alessandra Grout for complete physical again soon.  Although 88 years of age from cardiac standpoint she is doing extremely well.  She keeps herself busy by playing bingo with her friends, plays cards and also finds for herself.  With regard to carotid artery stenosis, left carotid stenosis is  very mild at the lower limit of moderate stenosis, hence given her age, as risk factors are well controlled including lipids and hypertension, will scan her only if clinically deemed necessary.  Otherwise I will see her back in a year.  Pacemaker function reviewed with the patient, I will continue to monitor her pacemaker.  She will continue to transmit remotely.  I will also schedule her for an in office pacemaker check soon.    Adrian Prows, MD, [facility] 06/15/2022, 2:55 PM Office: [phone] Pager: [phone]

## 2022-06-16 ENCOUNTER — Encounter: Payer: Self-pay | Admitting: Cardiology

## 2022-06-28 ENCOUNTER — Other Ambulatory Visit: Payer: Medicare Other

## 2022-06-29 DIAGNOSIS — I110 Hypertensive heart disease with heart failure: Secondary | ICD-10-CM | POA: Diagnosis not present

## 2022-07-04 ENCOUNTER — Ambulatory Visit: Payer: Medicare Other | Admitting: Cardiology

## 2022-07-07 DIAGNOSIS — I519 Heart disease, unspecified: Secondary | ICD-10-CM | POA: Diagnosis not present

## 2022-07-07 DIAGNOSIS — Z0000 Encounter for general adult medical examination without abnormal findings: Secondary | ICD-10-CM | POA: Diagnosis not present

## 2022-07-07 DIAGNOSIS — Z79899 Other long term (current) drug therapy: Secondary | ICD-10-CM | POA: Diagnosis not present

## 2022-07-07 DIAGNOSIS — I10 Essential (primary) hypertension: Secondary | ICD-10-CM | POA: Diagnosis not present

## 2022-07-07 DIAGNOSIS — D692 Other nonthrombocytopenic purpura: Secondary | ICD-10-CM | POA: Diagnosis not present

## 2022-07-07 DIAGNOSIS — M818 Other osteoporosis without current pathological fracture: Secondary | ICD-10-CM | POA: Diagnosis not present

## 2022-07-07 DIAGNOSIS — R7303 Prediabetes: Secondary | ICD-10-CM | POA: Diagnosis not present

## 2022-07-07 DIAGNOSIS — I779 Disorder of arteries and arterioles, unspecified: Secondary | ICD-10-CM | POA: Diagnosis not present

## 2022-07-07 DIAGNOSIS — I4510 Unspecified right bundle-branch block: Secondary | ICD-10-CM | POA: Diagnosis not present

## 2022-07-07 DIAGNOSIS — I517 Cardiomegaly: Secondary | ICD-10-CM | POA: Diagnosis not present

## 2022-07-07 DIAGNOSIS — E785 Hyperlipidemia, unspecified: Secondary | ICD-10-CM | POA: Diagnosis not present

## 2022-07-11 ENCOUNTER — Ambulatory Visit: Payer: Medicare Other | Admitting: Cardiology

## 2022-07-13 DIAGNOSIS — R339 Retention of urine, unspecified: Secondary | ICD-10-CM | POA: Diagnosis not present

## 2022-07-13 DIAGNOSIS — K219 Gastro-esophageal reflux disease without esophagitis: Secondary | ICD-10-CM | POA: Diagnosis not present

## 2022-07-13 DIAGNOSIS — I110 Hypertensive heart disease with heart failure: Secondary | ICD-10-CM | POA: Diagnosis not present

## 2022-07-13 NOTE — Telephone Encounter (Signed)
You have not sent a request in [place] for this patient yet so we are still following them

## 2022-07-18 DIAGNOSIS — K219 Gastro-esophageal reflux disease without esophagitis: Secondary | ICD-10-CM | POA: Diagnosis not present

## 2022-07-18 DIAGNOSIS — G629 Polyneuropathy, unspecified: Secondary | ICD-10-CM | POA: Diagnosis not present

## 2022-07-18 DIAGNOSIS — R339 Retention of urine, unspecified: Secondary | ICD-10-CM | POA: Diagnosis not present

## 2022-07-18 DIAGNOSIS — J301 Allergic rhinitis due to pollen: Secondary | ICD-10-CM | POA: Diagnosis not present

## 2022-07-18 DIAGNOSIS — I110 Hypertensive heart disease with heart failure: Secondary | ICD-10-CM | POA: Diagnosis not present

## 2022-08-09 DIAGNOSIS — I10 Essential (primary) hypertension: Secondary | ICD-10-CM | POA: Diagnosis not present

## 2022-08-09 DIAGNOSIS — E871 Hypo-osmolality and hyponatremia: Secondary | ICD-10-CM | POA: Diagnosis not present

## 2022-08-10 DIAGNOSIS — M1712 Unilateral primary osteoarthritis, left knee: Secondary | ICD-10-CM | POA: Diagnosis not present

## 2022-08-10 DIAGNOSIS — J301 Allergic rhinitis due to pollen: Secondary | ICD-10-CM | POA: Diagnosis not present

## 2022-08-10 DIAGNOSIS — I110 Hypertensive heart disease with heart failure: Secondary | ICD-10-CM | POA: Diagnosis not present

## 2022-08-10 DIAGNOSIS — Z79899 Other long term (current) drug therapy: Secondary | ICD-10-CM | POA: Diagnosis not present

## 2022-08-24 DIAGNOSIS — M1712 Unilateral primary osteoarthritis, left knee: Secondary | ICD-10-CM | POA: Diagnosis not present

## 2022-08-24 DIAGNOSIS — I110 Hypertensive heart disease with heart failure: Secondary | ICD-10-CM | POA: Diagnosis not present

## 2022-09-05 ENCOUNTER — Ambulatory Visit (INDEPENDENT_AMBULATORY_CARE_PROVIDER_SITE_OTHER): Payer: Medicare Other

## 2022-09-05 DIAGNOSIS — I442 Atrioventricular block, complete: Secondary | ICD-10-CM | POA: Diagnosis not present

## 2022-09-06 LAB — CUP PACEART REMOTE DEVICE CHECK
Battery Remaining Longevity: 98 mo
Battery Remaining Percentage: 94 %
Battery Voltage: 3.01 V
Brady Statistic AP VP Percent: 6.1 %
Brady Statistic AP VS Percent: 1 %
Brady Statistic AS VP Percent: 94 %
Brady Statistic AS VS Percent: 1 %
Brady Statistic RA Percent Paced: 6.1 %
Brady Statistic RV Percent Paced: 99 %
Date Time Interrogation Session: 20230916020014
Implantable Lead Implant Date: 20221121
Implantable Lead Implant Date: 20221121
Implantable Lead Location: 753859
Implantable Lead Location: 753860
Implantable Pulse Generator Implant Date: 20221121
Lead Channel Impedance Value: 430 Ohm
Lead Channel Impedance Value: 560 Ohm
Lead Channel Pacing Threshold Amplitude: 0.5 V
Lead Channel Pacing Threshold Amplitude: 0.75 V
Lead Channel Pacing Threshold Pulse Width: 0.5 ms
Lead Channel Pacing Threshold Pulse Width: 0.5 ms
Lead Channel Sensing Intrinsic Amplitude: 12 mV
Lead Channel Sensing Intrinsic Amplitude: 5 mV
Lead Channel Setting Pacing Amplitude: 2.5 V
Lead Channel Setting Pacing Amplitude: 2.5 V
Lead Channel Setting Pacing Pulse Width: 0.5 ms
Lead Channel Setting Sensing Sensitivity: 2 mV
Pulse Gen Model: 2272
Pulse Gen Serial Number: 3975389

## 2022-09-07 DIAGNOSIS — K219 Gastro-esophageal reflux disease without esophagitis: Secondary | ICD-10-CM | POA: Diagnosis not present

## 2022-09-07 DIAGNOSIS — Z79899 Other long term (current) drug therapy: Secondary | ICD-10-CM | POA: Diagnosis not present

## 2022-09-07 DIAGNOSIS — I110 Hypertensive heart disease with heart failure: Secondary | ICD-10-CM | POA: Diagnosis not present

## 2022-09-14 DIAGNOSIS — M1712 Unilateral primary osteoarthritis, left knee: Secondary | ICD-10-CM | POA: Diagnosis not present

## 2022-09-20 NOTE — Progress Notes (Signed)
Remote pacemaker transmission.   

## 2022-09-21 DIAGNOSIS — I110 Hypertensive heart disease with heart failure: Secondary | ICD-10-CM | POA: Diagnosis not present

## 2022-09-21 DIAGNOSIS — M1712 Unilateral primary osteoarthritis, left knee: Secondary | ICD-10-CM | POA: Diagnosis not present

## 2022-09-21 DIAGNOSIS — I509 Heart failure, unspecified: Secondary | ICD-10-CM | POA: Diagnosis not present

## 2022-10-05 DIAGNOSIS — G4700 Insomnia, unspecified: Secondary | ICD-10-CM | POA: Diagnosis not present

## 2022-10-05 DIAGNOSIS — I442 Atrioventricular block, complete: Secondary | ICD-10-CM | POA: Diagnosis not present

## 2022-10-05 DIAGNOSIS — I509 Heart failure, unspecified: Secondary | ICD-10-CM | POA: Diagnosis not present

## 2022-10-05 DIAGNOSIS — E871 Hypo-osmolality and hyponatremia: Secondary | ICD-10-CM | POA: Diagnosis not present

## 2022-10-05 DIAGNOSIS — I5032 Chronic diastolic (congestive) heart failure: Secondary | ICD-10-CM | POA: Diagnosis not present

## 2022-10-05 DIAGNOSIS — R7303 Prediabetes: Secondary | ICD-10-CM | POA: Diagnosis not present

## 2022-10-05 DIAGNOSIS — K219 Gastro-esophageal reflux disease without esophagitis: Secondary | ICD-10-CM | POA: Diagnosis not present

## 2022-10-05 DIAGNOSIS — Z7982 Long term (current) use of aspirin: Secondary | ICD-10-CM | POA: Diagnosis not present

## 2022-10-05 DIAGNOSIS — N1832 Chronic kidney disease, stage 3b: Secondary | ICD-10-CM | POA: Diagnosis not present

## 2022-10-05 DIAGNOSIS — Z79891 Long term (current) use of opiate analgesic: Secondary | ICD-10-CM | POA: Diagnosis not present

## 2022-10-05 DIAGNOSIS — Z950 Presence of cardiac pacemaker: Secondary | ICD-10-CM | POA: Diagnosis not present

## 2022-10-05 DIAGNOSIS — I110 Hypertensive heart disease with heart failure: Secondary | ICD-10-CM | POA: Diagnosis not present

## 2022-10-05 DIAGNOSIS — I2510 Atherosclerotic heart disease of native coronary artery without angina pectoris: Secondary | ICD-10-CM | POA: Diagnosis not present

## 2022-10-05 DIAGNOSIS — I6522 Occlusion and stenosis of left carotid artery: Secondary | ICD-10-CM | POA: Diagnosis not present

## 2022-10-05 DIAGNOSIS — M5136 Other intervertebral disc degeneration, lumbar region: Secondary | ICD-10-CM | POA: Diagnosis not present

## 2022-10-05 DIAGNOSIS — M549 Dorsalgia, unspecified: Secondary | ICD-10-CM | POA: Diagnosis not present

## 2022-10-05 DIAGNOSIS — M170 Bilateral primary osteoarthritis of knee: Secondary | ICD-10-CM | POA: Diagnosis not present

## 2022-10-05 DIAGNOSIS — R339 Retention of urine, unspecified: Secondary | ICD-10-CM | POA: Diagnosis not present

## 2022-10-05 DIAGNOSIS — E7800 Pure hypercholesterolemia, unspecified: Secondary | ICD-10-CM | POA: Diagnosis not present

## 2022-10-05 DIAGNOSIS — E559 Vitamin D deficiency, unspecified: Secondary | ICD-10-CM | POA: Diagnosis not present

## 2022-10-05 DIAGNOSIS — J301 Allergic rhinitis due to pollen: Secondary | ICD-10-CM | POA: Diagnosis not present

## 2022-10-05 DIAGNOSIS — K5901 Slow transit constipation: Secondary | ICD-10-CM | POA: Diagnosis not present

## 2022-10-05 DIAGNOSIS — I130 Hypertensive heart and chronic kidney disease with heart failure and stage 1 through stage 4 chronic kidney disease, or unspecified chronic kidney disease: Secondary | ICD-10-CM | POA: Diagnosis not present

## 2022-10-06 DIAGNOSIS — I10 Essential (primary) hypertension: Secondary | ICD-10-CM | POA: Diagnosis not present

## 2022-10-06 DIAGNOSIS — M810 Age-related osteoporosis without current pathological fracture: Secondary | ICD-10-CM | POA: Diagnosis not present

## 2022-10-06 DIAGNOSIS — E785 Hyperlipidemia, unspecified: Secondary | ICD-10-CM | POA: Diagnosis not present

## 2022-10-06 DIAGNOSIS — I5042 Chronic combined systolic (congestive) and diastolic (congestive) heart failure: Secondary | ICD-10-CM | POA: Diagnosis not present

## 2022-10-06 DIAGNOSIS — K219 Gastro-esophageal reflux disease without esophagitis: Secondary | ICD-10-CM | POA: Diagnosis not present

## 2022-10-06 DIAGNOSIS — N1831 Chronic kidney disease, stage 3a: Secondary | ICD-10-CM | POA: Diagnosis not present

## 2022-10-10 DIAGNOSIS — J301 Allergic rhinitis due to pollen: Secondary | ICD-10-CM | POA: Diagnosis not present

## 2022-10-10 DIAGNOSIS — R7303 Prediabetes: Secondary | ICD-10-CM | POA: Diagnosis not present

## 2022-10-10 DIAGNOSIS — N1832 Chronic kidney disease, stage 3b: Secondary | ICD-10-CM | POA: Diagnosis not present

## 2022-10-10 DIAGNOSIS — Z7982 Long term (current) use of aspirin: Secondary | ICD-10-CM | POA: Diagnosis not present

## 2022-10-10 DIAGNOSIS — I6522 Occlusion and stenosis of left carotid artery: Secondary | ICD-10-CM | POA: Diagnosis not present

## 2022-10-10 DIAGNOSIS — K219 Gastro-esophageal reflux disease without esophagitis: Secondary | ICD-10-CM | POA: Diagnosis not present

## 2022-10-10 DIAGNOSIS — M549 Dorsalgia, unspecified: Secondary | ICD-10-CM | POA: Diagnosis not present

## 2022-10-10 DIAGNOSIS — I5032 Chronic diastolic (congestive) heart failure: Secondary | ICD-10-CM | POA: Diagnosis not present

## 2022-10-10 DIAGNOSIS — K5901 Slow transit constipation: Secondary | ICD-10-CM | POA: Diagnosis not present

## 2022-10-10 DIAGNOSIS — G4700 Insomnia, unspecified: Secondary | ICD-10-CM | POA: Diagnosis not present

## 2022-10-10 DIAGNOSIS — E559 Vitamin D deficiency, unspecified: Secondary | ICD-10-CM | POA: Diagnosis not present

## 2022-10-10 DIAGNOSIS — R339 Retention of urine, unspecified: Secondary | ICD-10-CM | POA: Diagnosis not present

## 2022-10-10 DIAGNOSIS — E7800 Pure hypercholesterolemia, unspecified: Secondary | ICD-10-CM | POA: Diagnosis not present

## 2022-10-10 DIAGNOSIS — M5136 Other intervertebral disc degeneration, lumbar region: Secondary | ICD-10-CM | POA: Diagnosis not present

## 2022-10-10 DIAGNOSIS — I2510 Atherosclerotic heart disease of native coronary artery without angina pectoris: Secondary | ICD-10-CM | POA: Diagnosis not present

## 2022-10-10 DIAGNOSIS — E871 Hypo-osmolality and hyponatremia: Secondary | ICD-10-CM | POA: Diagnosis not present

## 2022-10-10 DIAGNOSIS — I442 Atrioventricular block, complete: Secondary | ICD-10-CM | POA: Diagnosis not present

## 2022-10-10 DIAGNOSIS — M170 Bilateral primary osteoarthritis of knee: Secondary | ICD-10-CM | POA: Diagnosis not present

## 2022-10-10 DIAGNOSIS — I130 Hypertensive heart and chronic kidney disease with heart failure and stage 1 through stage 4 chronic kidney disease, or unspecified chronic kidney disease: Secondary | ICD-10-CM | POA: Diagnosis not present

## 2022-10-10 DIAGNOSIS — Z950 Presence of cardiac pacemaker: Secondary | ICD-10-CM | POA: Diagnosis not present

## 2022-10-10 DIAGNOSIS — Z79891 Long term (current) use of opiate analgesic: Secondary | ICD-10-CM | POA: Diagnosis not present

## 2022-10-13 DIAGNOSIS — Z950 Presence of cardiac pacemaker: Secondary | ICD-10-CM | POA: Diagnosis not present

## 2022-10-13 DIAGNOSIS — I442 Atrioventricular block, complete: Secondary | ICD-10-CM | POA: Diagnosis not present

## 2022-10-13 DIAGNOSIS — M5136 Other intervertebral disc degeneration, lumbar region: Secondary | ICD-10-CM | POA: Diagnosis not present

## 2022-10-13 DIAGNOSIS — K5901 Slow transit constipation: Secondary | ICD-10-CM | POA: Diagnosis not present

## 2022-10-13 DIAGNOSIS — M549 Dorsalgia, unspecified: Secondary | ICD-10-CM | POA: Diagnosis not present

## 2022-10-13 DIAGNOSIS — N1832 Chronic kidney disease, stage 3b: Secondary | ICD-10-CM | POA: Diagnosis not present

## 2022-10-13 DIAGNOSIS — E559 Vitamin D deficiency, unspecified: Secondary | ICD-10-CM | POA: Diagnosis not present

## 2022-10-13 DIAGNOSIS — R7303 Prediabetes: Secondary | ICD-10-CM | POA: Diagnosis not present

## 2022-10-13 DIAGNOSIS — I2510 Atherosclerotic heart disease of native coronary artery without angina pectoris: Secondary | ICD-10-CM | POA: Diagnosis not present

## 2022-10-13 DIAGNOSIS — E871 Hypo-osmolality and hyponatremia: Secondary | ICD-10-CM | POA: Diagnosis not present

## 2022-10-13 DIAGNOSIS — K219 Gastro-esophageal reflux disease without esophagitis: Secondary | ICD-10-CM | POA: Diagnosis not present

## 2022-10-13 DIAGNOSIS — G4700 Insomnia, unspecified: Secondary | ICD-10-CM | POA: Diagnosis not present

## 2022-10-13 DIAGNOSIS — E7800 Pure hypercholesterolemia, unspecified: Secondary | ICD-10-CM | POA: Diagnosis not present

## 2022-10-13 DIAGNOSIS — M170 Bilateral primary osteoarthritis of knee: Secondary | ICD-10-CM | POA: Diagnosis not present

## 2022-10-13 DIAGNOSIS — Z7982 Long term (current) use of aspirin: Secondary | ICD-10-CM | POA: Diagnosis not present

## 2022-10-13 DIAGNOSIS — Z79891 Long term (current) use of opiate analgesic: Secondary | ICD-10-CM | POA: Diagnosis not present

## 2022-10-13 DIAGNOSIS — R339 Retention of urine, unspecified: Secondary | ICD-10-CM | POA: Diagnosis not present

## 2022-10-13 DIAGNOSIS — J301 Allergic rhinitis due to pollen: Secondary | ICD-10-CM | POA: Diagnosis not present

## 2022-10-13 DIAGNOSIS — I5032 Chronic diastolic (congestive) heart failure: Secondary | ICD-10-CM | POA: Diagnosis not present

## 2022-10-13 DIAGNOSIS — I130 Hypertensive heart and chronic kidney disease with heart failure and stage 1 through stage 4 chronic kidney disease, or unspecified chronic kidney disease: Secondary | ICD-10-CM | POA: Diagnosis not present

## 2022-10-13 DIAGNOSIS — I6522 Occlusion and stenosis of left carotid artery: Secondary | ICD-10-CM | POA: Diagnosis not present

## 2022-10-17 ENCOUNTER — Telehealth: Payer: Self-pay | Admitting: *Deleted

## 2022-10-17 NOTE — Patient Outreach (Signed)
  Care Coordination   10/17/2022 Name: Deborah Jordan MRN: [id number] DOB: 1933/11/05   Care Coordination Outreach Attempts:  An unsuccessful telephone outreach was attempted today to offer the patient information about available care coordination services as a benefit of their health plan.   Follow Up Plan:  Additional outreach attempts will be made to offer the patient care coordination information and services.   Encounter Outcome:  No Answer  Care Coordination Interventions Activated:  No   Care Coordination Interventions:  No, not indicated    Raina Mina, RN Care Management Coordinator [place] Office [phone]

## 2022-10-18 DIAGNOSIS — M170 Bilateral primary osteoarthritis of knee: Secondary | ICD-10-CM | POA: Diagnosis not present

## 2022-10-18 DIAGNOSIS — J301 Allergic rhinitis due to pollen: Secondary | ICD-10-CM | POA: Diagnosis not present

## 2022-10-18 DIAGNOSIS — M1712 Unilateral primary osteoarthritis, left knee: Secondary | ICD-10-CM | POA: Diagnosis not present

## 2022-10-18 DIAGNOSIS — K5901 Slow transit constipation: Secondary | ICD-10-CM | POA: Diagnosis not present

## 2022-10-18 DIAGNOSIS — R7303 Prediabetes: Secondary | ICD-10-CM | POA: Diagnosis not present

## 2022-10-18 DIAGNOSIS — E7800 Pure hypercholesterolemia, unspecified: Secondary | ICD-10-CM | POA: Diagnosis not present

## 2022-10-18 DIAGNOSIS — I6522 Occlusion and stenosis of left carotid artery: Secondary | ICD-10-CM | POA: Diagnosis not present

## 2022-10-18 DIAGNOSIS — I130 Hypertensive heart and chronic kidney disease with heart failure and stage 1 through stage 4 chronic kidney disease, or unspecified chronic kidney disease: Secondary | ICD-10-CM | POA: Diagnosis not present

## 2022-10-18 DIAGNOSIS — M549 Dorsalgia, unspecified: Secondary | ICD-10-CM | POA: Diagnosis not present

## 2022-10-18 DIAGNOSIS — E559 Vitamin D deficiency, unspecified: Secondary | ICD-10-CM | POA: Diagnosis not present

## 2022-10-18 DIAGNOSIS — K219 Gastro-esophageal reflux disease without esophagitis: Secondary | ICD-10-CM | POA: Diagnosis not present

## 2022-10-18 DIAGNOSIS — E871 Hypo-osmolality and hyponatremia: Secondary | ICD-10-CM | POA: Diagnosis not present

## 2022-10-18 DIAGNOSIS — Z950 Presence of cardiac pacemaker: Secondary | ICD-10-CM | POA: Diagnosis not present

## 2022-10-18 DIAGNOSIS — R339 Retention of urine, unspecified: Secondary | ICD-10-CM | POA: Diagnosis not present

## 2022-10-18 DIAGNOSIS — I2510 Atherosclerotic heart disease of native coronary artery without angina pectoris: Secondary | ICD-10-CM | POA: Diagnosis not present

## 2022-10-18 DIAGNOSIS — Z79891 Long term (current) use of opiate analgesic: Secondary | ICD-10-CM | POA: Diagnosis not present

## 2022-10-18 DIAGNOSIS — I442 Atrioventricular block, complete: Secondary | ICD-10-CM | POA: Diagnosis not present

## 2022-10-18 DIAGNOSIS — G4700 Insomnia, unspecified: Secondary | ICD-10-CM | POA: Diagnosis not present

## 2022-10-18 DIAGNOSIS — I5032 Chronic diastolic (congestive) heart failure: Secondary | ICD-10-CM | POA: Diagnosis not present

## 2022-10-18 DIAGNOSIS — Z7982 Long term (current) use of aspirin: Secondary | ICD-10-CM | POA: Diagnosis not present

## 2022-10-18 DIAGNOSIS — N1832 Chronic kidney disease, stage 3b: Secondary | ICD-10-CM | POA: Diagnosis not present

## 2022-10-18 DIAGNOSIS — M5136 Other intervertebral disc degeneration, lumbar region: Secondary | ICD-10-CM | POA: Diagnosis not present

## 2022-10-19 DIAGNOSIS — I110 Hypertensive heart disease with heart failure: Secondary | ICD-10-CM | POA: Diagnosis not present

## 2022-10-19 DIAGNOSIS — M1712 Unilateral primary osteoarthritis, left knee: Secondary | ICD-10-CM | POA: Diagnosis not present

## 2022-10-21 DIAGNOSIS — E559 Vitamin D deficiency, unspecified: Secondary | ICD-10-CM | POA: Diagnosis not present

## 2022-10-21 DIAGNOSIS — M5136 Other intervertebral disc degeneration, lumbar region: Secondary | ICD-10-CM | POA: Diagnosis not present

## 2022-10-21 DIAGNOSIS — M549 Dorsalgia, unspecified: Secondary | ICD-10-CM | POA: Diagnosis not present

## 2022-10-21 DIAGNOSIS — I5032 Chronic diastolic (congestive) heart failure: Secondary | ICD-10-CM | POA: Diagnosis not present

## 2022-10-21 DIAGNOSIS — R339 Retention of urine, unspecified: Secondary | ICD-10-CM | POA: Diagnosis not present

## 2022-10-21 DIAGNOSIS — Z7982 Long term (current) use of aspirin: Secondary | ICD-10-CM | POA: Diagnosis not present

## 2022-10-21 DIAGNOSIS — E871 Hypo-osmolality and hyponatremia: Secondary | ICD-10-CM | POA: Diagnosis not present

## 2022-10-21 DIAGNOSIS — E7800 Pure hypercholesterolemia, unspecified: Secondary | ICD-10-CM | POA: Diagnosis not present

## 2022-10-21 DIAGNOSIS — K5901 Slow transit constipation: Secondary | ICD-10-CM | POA: Diagnosis not present

## 2022-10-21 DIAGNOSIS — I130 Hypertensive heart and chronic kidney disease with heart failure and stage 1 through stage 4 chronic kidney disease, or unspecified chronic kidney disease: Secondary | ICD-10-CM | POA: Diagnosis not present

## 2022-10-21 DIAGNOSIS — M170 Bilateral primary osteoarthritis of knee: Secondary | ICD-10-CM | POA: Diagnosis not present

## 2022-10-21 DIAGNOSIS — J301 Allergic rhinitis due to pollen: Secondary | ICD-10-CM | POA: Diagnosis not present

## 2022-10-21 DIAGNOSIS — I6522 Occlusion and stenosis of left carotid artery: Secondary | ICD-10-CM | POA: Diagnosis not present

## 2022-10-21 DIAGNOSIS — Z950 Presence of cardiac pacemaker: Secondary | ICD-10-CM | POA: Diagnosis not present

## 2022-10-21 DIAGNOSIS — G4700 Insomnia, unspecified: Secondary | ICD-10-CM | POA: Diagnosis not present

## 2022-10-21 DIAGNOSIS — N1832 Chronic kidney disease, stage 3b: Secondary | ICD-10-CM | POA: Diagnosis not present

## 2022-10-21 DIAGNOSIS — R7303 Prediabetes: Secondary | ICD-10-CM | POA: Diagnosis not present

## 2022-10-21 DIAGNOSIS — Z79891 Long term (current) use of opiate analgesic: Secondary | ICD-10-CM | POA: Diagnosis not present

## 2022-10-21 DIAGNOSIS — I2510 Atherosclerotic heart disease of native coronary artery without angina pectoris: Secondary | ICD-10-CM | POA: Diagnosis not present

## 2022-10-21 DIAGNOSIS — I442 Atrioventricular block, complete: Secondary | ICD-10-CM | POA: Diagnosis not present

## 2022-10-21 DIAGNOSIS — K219 Gastro-esophageal reflux disease without esophagitis: Secondary | ICD-10-CM | POA: Diagnosis not present

## 2022-10-28 DIAGNOSIS — I442 Atrioventricular block, complete: Secondary | ICD-10-CM | POA: Diagnosis not present

## 2022-10-28 DIAGNOSIS — I5032 Chronic diastolic (congestive) heart failure: Secondary | ICD-10-CM | POA: Diagnosis not present

## 2022-10-28 DIAGNOSIS — R7303 Prediabetes: Secondary | ICD-10-CM | POA: Diagnosis not present

## 2022-10-28 DIAGNOSIS — Z7982 Long term (current) use of aspirin: Secondary | ICD-10-CM | POA: Diagnosis not present

## 2022-10-28 DIAGNOSIS — E7800 Pure hypercholesterolemia, unspecified: Secondary | ICD-10-CM | POA: Diagnosis not present

## 2022-10-28 DIAGNOSIS — K5901 Slow transit constipation: Secondary | ICD-10-CM | POA: Diagnosis not present

## 2022-10-28 DIAGNOSIS — K219 Gastro-esophageal reflux disease without esophagitis: Secondary | ICD-10-CM | POA: Diagnosis not present

## 2022-10-28 DIAGNOSIS — G4700 Insomnia, unspecified: Secondary | ICD-10-CM | POA: Diagnosis not present

## 2022-10-28 DIAGNOSIS — M5136 Other intervertebral disc degeneration, lumbar region: Secondary | ICD-10-CM | POA: Diagnosis not present

## 2022-10-28 DIAGNOSIS — J301 Allergic rhinitis due to pollen: Secondary | ICD-10-CM | POA: Diagnosis not present

## 2022-10-28 DIAGNOSIS — I2510 Atherosclerotic heart disease of native coronary artery without angina pectoris: Secondary | ICD-10-CM | POA: Diagnosis not present

## 2022-10-28 DIAGNOSIS — N1832 Chronic kidney disease, stage 3b: Secondary | ICD-10-CM | POA: Diagnosis not present

## 2022-10-28 DIAGNOSIS — R339 Retention of urine, unspecified: Secondary | ICD-10-CM | POA: Diagnosis not present

## 2022-10-28 DIAGNOSIS — I6522 Occlusion and stenosis of left carotid artery: Secondary | ICD-10-CM | POA: Diagnosis not present

## 2022-10-28 DIAGNOSIS — Z950 Presence of cardiac pacemaker: Secondary | ICD-10-CM | POA: Diagnosis not present

## 2022-10-28 DIAGNOSIS — Z79891 Long term (current) use of opiate analgesic: Secondary | ICD-10-CM | POA: Diagnosis not present

## 2022-10-28 DIAGNOSIS — M170 Bilateral primary osteoarthritis of knee: Secondary | ICD-10-CM | POA: Diagnosis not present

## 2022-10-28 DIAGNOSIS — E559 Vitamin D deficiency, unspecified: Secondary | ICD-10-CM | POA: Diagnosis not present

## 2022-10-28 DIAGNOSIS — M549 Dorsalgia, unspecified: Secondary | ICD-10-CM | POA: Diagnosis not present

## 2022-10-28 DIAGNOSIS — E871 Hypo-osmolality and hyponatremia: Secondary | ICD-10-CM | POA: Diagnosis not present

## 2022-10-28 DIAGNOSIS — I130 Hypertensive heart and chronic kidney disease with heart failure and stage 1 through stage 4 chronic kidney disease, or unspecified chronic kidney disease: Secondary | ICD-10-CM | POA: Diagnosis not present

## 2022-10-31 DIAGNOSIS — G4700 Insomnia, unspecified: Secondary | ICD-10-CM | POA: Diagnosis not present

## 2022-10-31 DIAGNOSIS — M170 Bilateral primary osteoarthritis of knee: Secondary | ICD-10-CM | POA: Diagnosis not present

## 2022-10-31 DIAGNOSIS — J301 Allergic rhinitis due to pollen: Secondary | ICD-10-CM | POA: Diagnosis not present

## 2022-10-31 DIAGNOSIS — K219 Gastro-esophageal reflux disease without esophagitis: Secondary | ICD-10-CM | POA: Diagnosis not present

## 2022-10-31 DIAGNOSIS — M549 Dorsalgia, unspecified: Secondary | ICD-10-CM | POA: Diagnosis not present

## 2022-10-31 DIAGNOSIS — E7800 Pure hypercholesterolemia, unspecified: Secondary | ICD-10-CM | POA: Diagnosis not present

## 2022-10-31 DIAGNOSIS — I5032 Chronic diastolic (congestive) heart failure: Secondary | ICD-10-CM | POA: Diagnosis not present

## 2022-10-31 DIAGNOSIS — Z7982 Long term (current) use of aspirin: Secondary | ICD-10-CM | POA: Diagnosis not present

## 2022-10-31 DIAGNOSIS — Z950 Presence of cardiac pacemaker: Secondary | ICD-10-CM | POA: Diagnosis not present

## 2022-10-31 DIAGNOSIS — E871 Hypo-osmolality and hyponatremia: Secondary | ICD-10-CM | POA: Diagnosis not present

## 2022-10-31 DIAGNOSIS — R7303 Prediabetes: Secondary | ICD-10-CM | POA: Diagnosis not present

## 2022-10-31 DIAGNOSIS — I442 Atrioventricular block, complete: Secondary | ICD-10-CM | POA: Diagnosis not present

## 2022-10-31 DIAGNOSIS — I6522 Occlusion and stenosis of left carotid artery: Secondary | ICD-10-CM | POA: Diagnosis not present

## 2022-10-31 DIAGNOSIS — I130 Hypertensive heart and chronic kidney disease with heart failure and stage 1 through stage 4 chronic kidney disease, or unspecified chronic kidney disease: Secondary | ICD-10-CM | POA: Diagnosis not present

## 2022-10-31 DIAGNOSIS — M5136 Other intervertebral disc degeneration, lumbar region: Secondary | ICD-10-CM | POA: Diagnosis not present

## 2022-10-31 DIAGNOSIS — E559 Vitamin D deficiency, unspecified: Secondary | ICD-10-CM | POA: Diagnosis not present

## 2022-10-31 DIAGNOSIS — I2510 Atherosclerotic heart disease of native coronary artery without angina pectoris: Secondary | ICD-10-CM | POA: Diagnosis not present

## 2022-10-31 DIAGNOSIS — K5901 Slow transit constipation: Secondary | ICD-10-CM | POA: Diagnosis not present

## 2022-10-31 DIAGNOSIS — N1832 Chronic kidney disease, stage 3b: Secondary | ICD-10-CM | POA: Diagnosis not present

## 2022-10-31 DIAGNOSIS — Z79891 Long term (current) use of opiate analgesic: Secondary | ICD-10-CM | POA: Diagnosis not present

## 2022-10-31 DIAGNOSIS — R339 Retention of urine, unspecified: Secondary | ICD-10-CM | POA: Diagnosis not present

## 2022-11-02 ENCOUNTER — Ambulatory Visit: Payer: Medicare Other | Admitting: Cardiology

## 2022-11-02 VITALS — BP 112/64 | HR 94 | Resp 16 | Ht <= 58 in | Wt 187.0 lb

## 2022-11-02 DIAGNOSIS — K219 Gastro-esophageal reflux disease without esophagitis: Secondary | ICD-10-CM | POA: Diagnosis not present

## 2022-11-02 DIAGNOSIS — E559 Vitamin D deficiency, unspecified: Secondary | ICD-10-CM | POA: Diagnosis not present

## 2022-11-02 DIAGNOSIS — I110 Hypertensive heart disease with heart failure: Secondary | ICD-10-CM | POA: Diagnosis not present

## 2022-11-02 DIAGNOSIS — I130 Hypertensive heart and chronic kidney disease with heart failure and stage 1 through stage 4 chronic kidney disease, or unspecified chronic kidney disease: Secondary | ICD-10-CM | POA: Diagnosis not present

## 2022-11-02 DIAGNOSIS — I6522 Occlusion and stenosis of left carotid artery: Secondary | ICD-10-CM | POA: Diagnosis not present

## 2022-11-02 DIAGNOSIS — Z45018 Encounter for adjustment and management of other part of cardiac pacemaker: Secondary | ICD-10-CM

## 2022-11-02 DIAGNOSIS — I442 Atrioventricular block, complete: Secondary | ICD-10-CM

## 2022-11-02 DIAGNOSIS — K5901 Slow transit constipation: Secondary | ICD-10-CM | POA: Diagnosis not present

## 2022-11-02 DIAGNOSIS — Z7982 Long term (current) use of aspirin: Secondary | ICD-10-CM | POA: Diagnosis not present

## 2022-11-02 DIAGNOSIS — E871 Hypo-osmolality and hyponatremia: Secondary | ICD-10-CM | POA: Diagnosis not present

## 2022-11-02 DIAGNOSIS — Z79891 Long term (current) use of opiate analgesic: Secondary | ICD-10-CM | POA: Diagnosis not present

## 2022-11-02 DIAGNOSIS — J301 Allergic rhinitis due to pollen: Secondary | ICD-10-CM | POA: Diagnosis not present

## 2022-11-02 DIAGNOSIS — E7800 Pure hypercholesterolemia, unspecified: Secondary | ICD-10-CM | POA: Diagnosis not present

## 2022-11-02 DIAGNOSIS — Z950 Presence of cardiac pacemaker: Secondary | ICD-10-CM | POA: Diagnosis not present

## 2022-11-02 DIAGNOSIS — R7303 Prediabetes: Secondary | ICD-10-CM | POA: Diagnosis not present

## 2022-11-02 DIAGNOSIS — M5136 Other intervertebral disc degeneration, lumbar region: Secondary | ICD-10-CM | POA: Diagnosis not present

## 2022-11-02 DIAGNOSIS — I509 Heart failure, unspecified: Secondary | ICD-10-CM | POA: Diagnosis not present

## 2022-11-02 DIAGNOSIS — N1832 Chronic kidney disease, stage 3b: Secondary | ICD-10-CM | POA: Diagnosis not present

## 2022-11-02 DIAGNOSIS — R339 Retention of urine, unspecified: Secondary | ICD-10-CM | POA: Diagnosis not present

## 2022-11-02 DIAGNOSIS — I5032 Chronic diastolic (congestive) heart failure: Secondary | ICD-10-CM | POA: Diagnosis not present

## 2022-11-02 DIAGNOSIS — M549 Dorsalgia, unspecified: Secondary | ICD-10-CM | POA: Diagnosis not present

## 2022-11-02 DIAGNOSIS — G4700 Insomnia, unspecified: Secondary | ICD-10-CM | POA: Diagnosis not present

## 2022-11-02 DIAGNOSIS — M170 Bilateral primary osteoarthritis of knee: Secondary | ICD-10-CM | POA: Diagnosis not present

## 2022-11-02 DIAGNOSIS — I2510 Atherosclerotic heart disease of native coronary artery without angina pectoris: Secondary | ICD-10-CM | POA: Diagnosis not present

## 2022-11-02 NOTE — Progress Notes (Signed)
Chief Complaint  Patient presents with   Pacemaker Check    Encounter for care of pacemaker  Heart block AV complete ([place])  Pacemaker Abbott dual chamber PACEMAKER ASSURITY DR-RF 11/05/2021  Remote dual-chamber pacemaker transmission 06/03/2022: AP 6%, VP >99%.  Longevity 8.1-8.6 years.  Lead impedance and thresholds within normal limits.  There are brief AMS episodes, brief atrial tachycardia.  Scheduled  In office pacemaker check 11/02/22  Single (S)/Dual (D)/BV: D. Presenting D. Pacemaker dependant:  Yes. Underlying ASVP, No escape. AP 6.1%, VP 99%.  AMS Episodes 2.  AT/AF burden <0.1% . Longest 6 Sec.  HVR 0.   Longevity 7.9-8.3 Years. Magnet rate: >85%. Lead measurements: Stable. Histogram: Low (L)/normal (N)/high (H)  Normal. Patient activity Good.   Observations: Normal pacemaker function. Changes: Increase sensitivity to 4 from 2 to improve complete capture, turn of VIP.

## 2022-11-03 ENCOUNTER — Encounter: Payer: Self-pay | Admitting: Cardiology

## 2022-11-03 DIAGNOSIS — I442 Atrioventricular block, complete: Secondary | ICD-10-CM | POA: Diagnosis not present

## 2022-11-03 DIAGNOSIS — Z45018 Encounter for adjustment and management of other part of cardiac pacemaker: Secondary | ICD-10-CM | POA: Diagnosis not present

## 2022-11-07 DIAGNOSIS — Z950 Presence of cardiac pacemaker: Secondary | ICD-10-CM | POA: Diagnosis not present

## 2022-11-07 DIAGNOSIS — M5136 Other intervertebral disc degeneration, lumbar region: Secondary | ICD-10-CM | POA: Diagnosis not present

## 2022-11-07 DIAGNOSIS — I2510 Atherosclerotic heart disease of native coronary artery without angina pectoris: Secondary | ICD-10-CM | POA: Diagnosis not present

## 2022-11-07 DIAGNOSIS — I6522 Occlusion and stenosis of left carotid artery: Secondary | ICD-10-CM | POA: Diagnosis not present

## 2022-11-07 DIAGNOSIS — K5901 Slow transit constipation: Secondary | ICD-10-CM | POA: Diagnosis not present

## 2022-11-07 DIAGNOSIS — E559 Vitamin D deficiency, unspecified: Secondary | ICD-10-CM | POA: Diagnosis not present

## 2022-11-07 DIAGNOSIS — Z7982 Long term (current) use of aspirin: Secondary | ICD-10-CM | POA: Diagnosis not present

## 2022-11-07 DIAGNOSIS — I442 Atrioventricular block, complete: Secondary | ICD-10-CM | POA: Diagnosis not present

## 2022-11-07 DIAGNOSIS — I130 Hypertensive heart and chronic kidney disease with heart failure and stage 1 through stage 4 chronic kidney disease, or unspecified chronic kidney disease: Secondary | ICD-10-CM | POA: Diagnosis not present

## 2022-11-07 DIAGNOSIS — J301 Allergic rhinitis due to pollen: Secondary | ICD-10-CM | POA: Diagnosis not present

## 2022-11-07 DIAGNOSIS — R339 Retention of urine, unspecified: Secondary | ICD-10-CM | POA: Diagnosis not present

## 2022-11-07 DIAGNOSIS — E871 Hypo-osmolality and hyponatremia: Secondary | ICD-10-CM | POA: Diagnosis not present

## 2022-11-07 DIAGNOSIS — G4700 Insomnia, unspecified: Secondary | ICD-10-CM | POA: Diagnosis not present

## 2022-11-07 DIAGNOSIS — M549 Dorsalgia, unspecified: Secondary | ICD-10-CM | POA: Diagnosis not present

## 2022-11-07 DIAGNOSIS — N1832 Chronic kidney disease, stage 3b: Secondary | ICD-10-CM | POA: Diagnosis not present

## 2022-11-07 DIAGNOSIS — K219 Gastro-esophageal reflux disease without esophagitis: Secondary | ICD-10-CM | POA: Diagnosis not present

## 2022-11-07 DIAGNOSIS — I5032 Chronic diastolic (congestive) heart failure: Secondary | ICD-10-CM | POA: Diagnosis not present

## 2022-11-07 DIAGNOSIS — R7303 Prediabetes: Secondary | ICD-10-CM | POA: Diagnosis not present

## 2022-11-07 DIAGNOSIS — E7800 Pure hypercholesterolemia, unspecified: Secondary | ICD-10-CM | POA: Diagnosis not present

## 2022-11-07 DIAGNOSIS — M170 Bilateral primary osteoarthritis of knee: Secondary | ICD-10-CM | POA: Diagnosis not present

## 2022-11-07 DIAGNOSIS — Z79891 Long term (current) use of opiate analgesic: Secondary | ICD-10-CM | POA: Diagnosis not present

## 2022-11-08 DIAGNOSIS — K219 Gastro-esophageal reflux disease without esophagitis: Secondary | ICD-10-CM | POA: Diagnosis not present

## 2022-11-08 DIAGNOSIS — I509 Heart failure, unspecified: Secondary | ICD-10-CM | POA: Diagnosis not present

## 2022-11-08 DIAGNOSIS — E871 Hypo-osmolality and hyponatremia: Secondary | ICD-10-CM | POA: Diagnosis not present

## 2022-11-08 DIAGNOSIS — I110 Hypertensive heart disease with heart failure: Secondary | ICD-10-CM | POA: Diagnosis not present

## 2022-11-08 DIAGNOSIS — R339 Retention of urine, unspecified: Secondary | ICD-10-CM | POA: Diagnosis not present

## 2022-11-15 DIAGNOSIS — M1712 Unilateral primary osteoarthritis, left knee: Secondary | ICD-10-CM | POA: Diagnosis not present

## 2022-11-15 DIAGNOSIS — I130 Hypertensive heart and chronic kidney disease with heart failure and stage 1 through stage 4 chronic kidney disease, or unspecified chronic kidney disease: Secondary | ICD-10-CM | POA: Diagnosis not present

## 2022-11-16 DIAGNOSIS — M170 Bilateral primary osteoarthritis of knee: Secondary | ICD-10-CM | POA: Diagnosis not present

## 2022-11-16 DIAGNOSIS — I110 Hypertensive heart disease with heart failure: Secondary | ICD-10-CM | POA: Diagnosis not present

## 2022-11-17 DIAGNOSIS — K5901 Slow transit constipation: Secondary | ICD-10-CM | POA: Diagnosis not present

## 2022-11-17 DIAGNOSIS — N1832 Chronic kidney disease, stage 3b: Secondary | ICD-10-CM | POA: Diagnosis not present

## 2022-11-17 DIAGNOSIS — I5032 Chronic diastolic (congestive) heart failure: Secondary | ICD-10-CM | POA: Diagnosis not present

## 2022-11-17 DIAGNOSIS — E7800 Pure hypercholesterolemia, unspecified: Secondary | ICD-10-CM | POA: Diagnosis not present

## 2022-11-17 DIAGNOSIS — K219 Gastro-esophageal reflux disease without esophagitis: Secondary | ICD-10-CM | POA: Diagnosis not present

## 2022-11-17 DIAGNOSIS — I2510 Atherosclerotic heart disease of native coronary artery without angina pectoris: Secondary | ICD-10-CM | POA: Diagnosis not present

## 2022-11-17 DIAGNOSIS — R339 Retention of urine, unspecified: Secondary | ICD-10-CM | POA: Diagnosis not present

## 2022-11-17 DIAGNOSIS — E871 Hypo-osmolality and hyponatremia: Secondary | ICD-10-CM | POA: Diagnosis not present

## 2022-11-17 DIAGNOSIS — R7303 Prediabetes: Secondary | ICD-10-CM | POA: Diagnosis not present

## 2022-11-17 DIAGNOSIS — G4700 Insomnia, unspecified: Secondary | ICD-10-CM | POA: Diagnosis not present

## 2022-11-17 DIAGNOSIS — E559 Vitamin D deficiency, unspecified: Secondary | ICD-10-CM | POA: Diagnosis not present

## 2022-11-17 DIAGNOSIS — I442 Atrioventricular block, complete: Secondary | ICD-10-CM | POA: Diagnosis not present

## 2022-11-17 DIAGNOSIS — M549 Dorsalgia, unspecified: Secondary | ICD-10-CM | POA: Diagnosis not present

## 2022-11-17 DIAGNOSIS — M170 Bilateral primary osteoarthritis of knee: Secondary | ICD-10-CM | POA: Diagnosis not present

## 2022-11-17 DIAGNOSIS — M5136 Other intervertebral disc degeneration, lumbar region: Secondary | ICD-10-CM | POA: Diagnosis not present

## 2022-11-17 DIAGNOSIS — Z7982 Long term (current) use of aspirin: Secondary | ICD-10-CM | POA: Diagnosis not present

## 2022-11-17 DIAGNOSIS — Z79891 Long term (current) use of opiate analgesic: Secondary | ICD-10-CM | POA: Diagnosis not present

## 2022-11-17 DIAGNOSIS — Z950 Presence of cardiac pacemaker: Secondary | ICD-10-CM | POA: Diagnosis not present

## 2022-11-17 DIAGNOSIS — J301 Allergic rhinitis due to pollen: Secondary | ICD-10-CM | POA: Diagnosis not present

## 2022-11-17 DIAGNOSIS — I6522 Occlusion and stenosis of left carotid artery: Secondary | ICD-10-CM | POA: Diagnosis not present

## 2022-11-17 DIAGNOSIS — I130 Hypertensive heart and chronic kidney disease with heart failure and stage 1 through stage 4 chronic kidney disease, or unspecified chronic kidney disease: Secondary | ICD-10-CM | POA: Diagnosis not present

## 2022-11-20 IMAGING — CT CT HEAD W/O CM
4 series · 17 of 47 positions shown, 19 images · non-contrast
Comparison: None.

CLINICAL DATA: 87-year-old female with altered mental status.

EXAM:
CT HEAD WITHOUT CONTRAST
TECHNIQUE: Contiguous axial images were obtained from the base of the skull
through the vertex without intravenous contrast.

[Series 3: head without · axial · non-contrast · 0.46mm/px · z∈[-132,-12]mm · 7 of 32 slices shown, 9 images]
[im 4/32  brain]
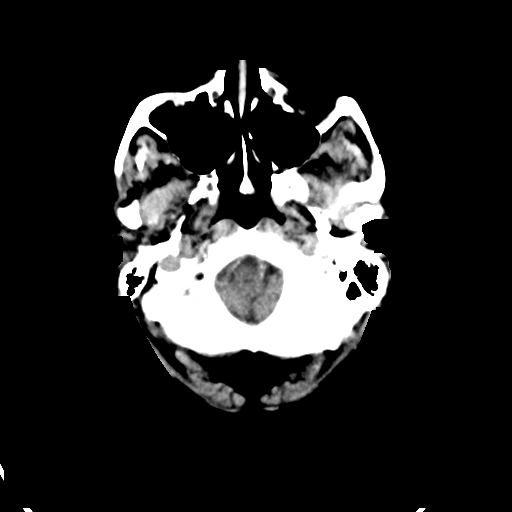
[im 4/32  bone]
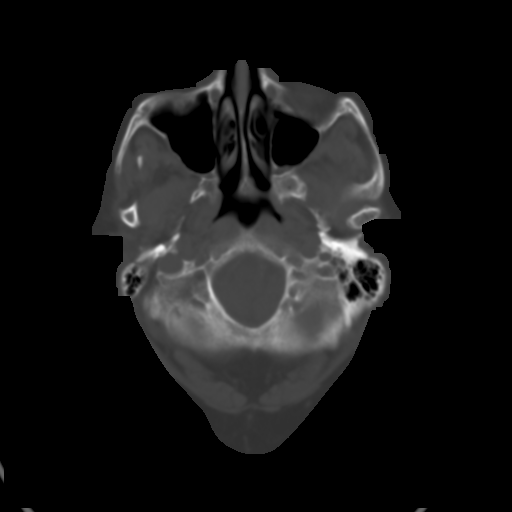
[im 8/32  brain]
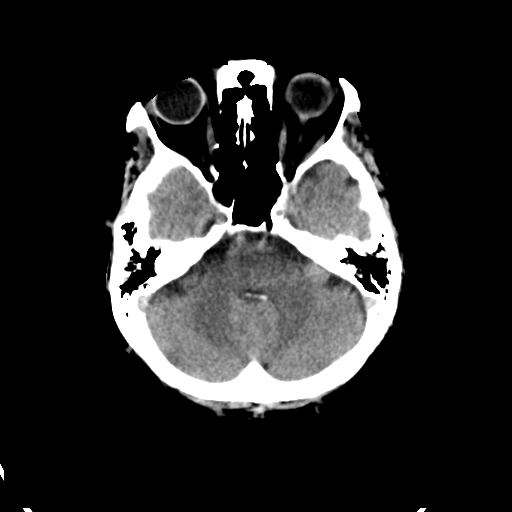
[im 12/32  brain]
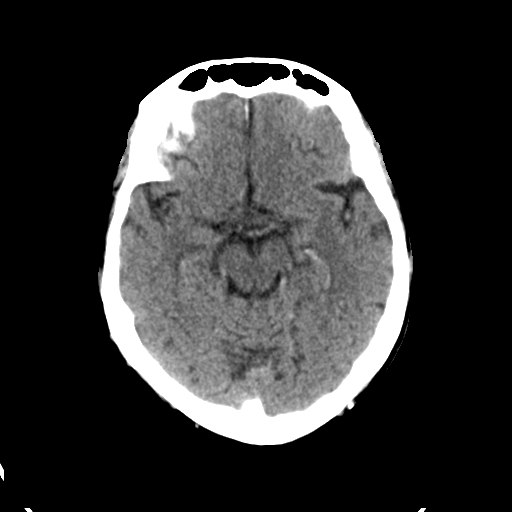
[im 16/32  brain]
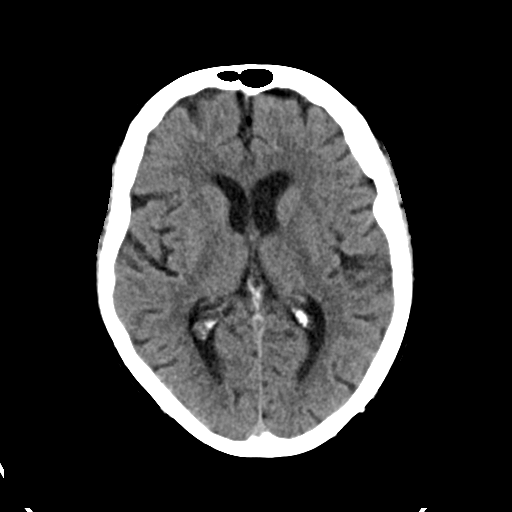
[im 20/32  brain]
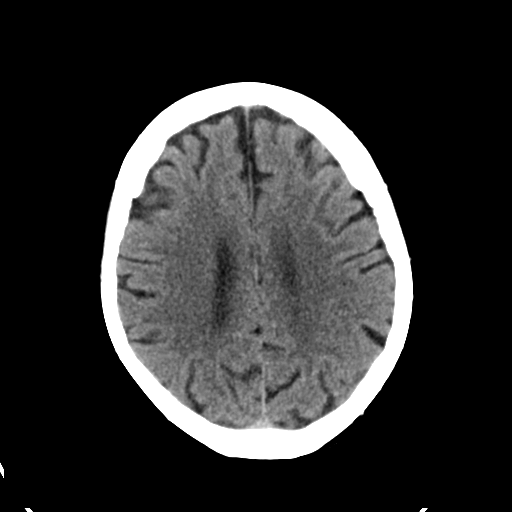
[im 20/32  bone]
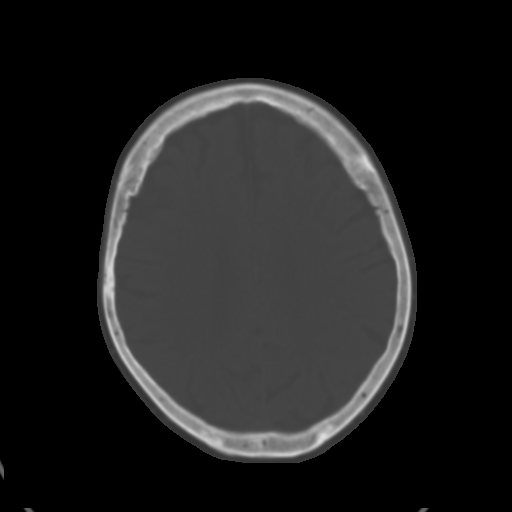
[im 24/32  brain]
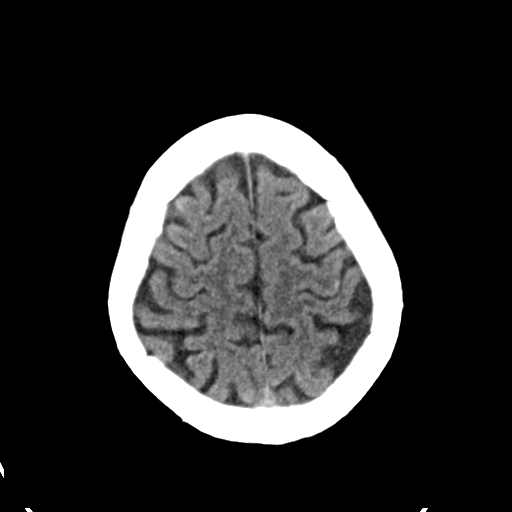
[im 28/32  brain]
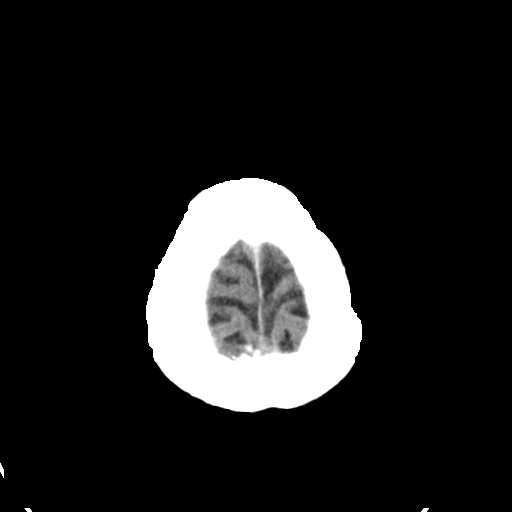

[Series 4: head bone · axial · 0.46mm/px · z∈[-133,-79]mm · 4 of 78 slices shown]
[im 8/78  bone]
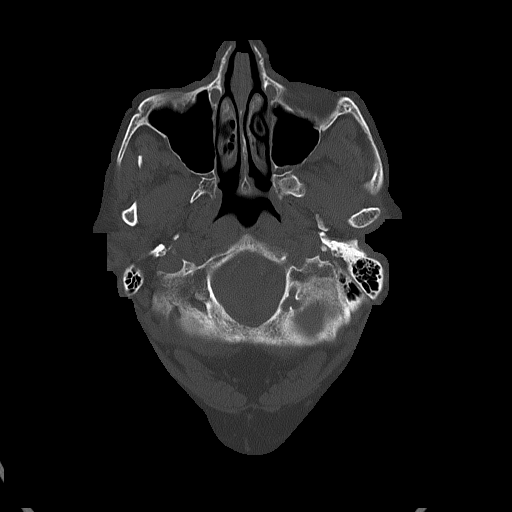
[im 16/78  bone]
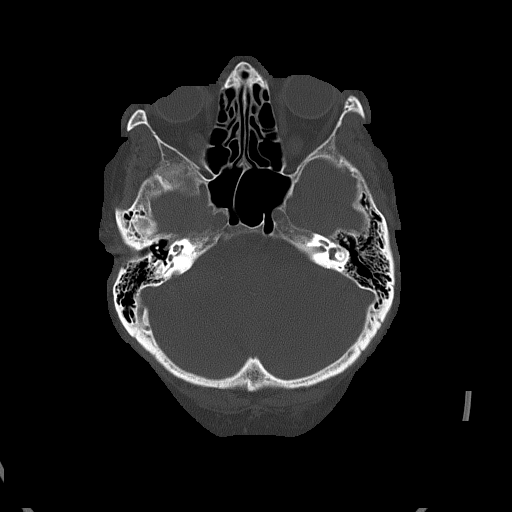
[im 24/78  bone]
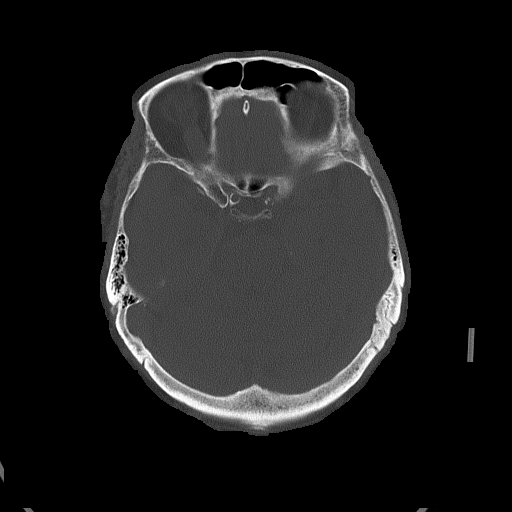
[im 35/78  bone]
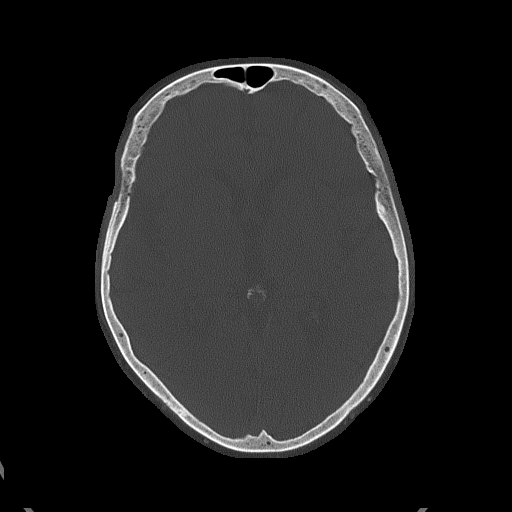

[Series 5: head without cor · coronal · non-contrast · 0.35mm/px · 3 of 67 slices shown]
[im 23/67  brain]
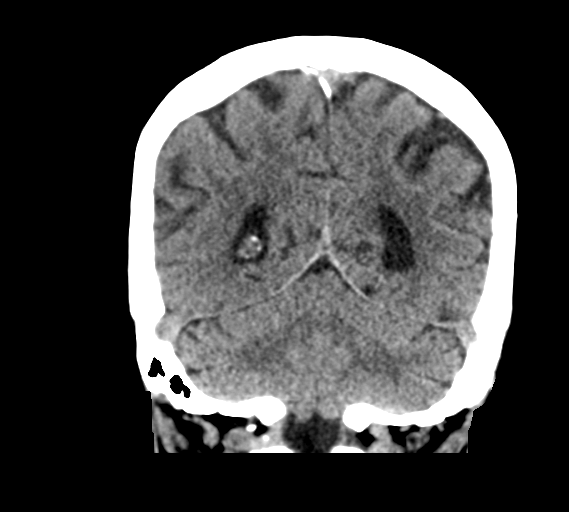
[im 30/67  brain]
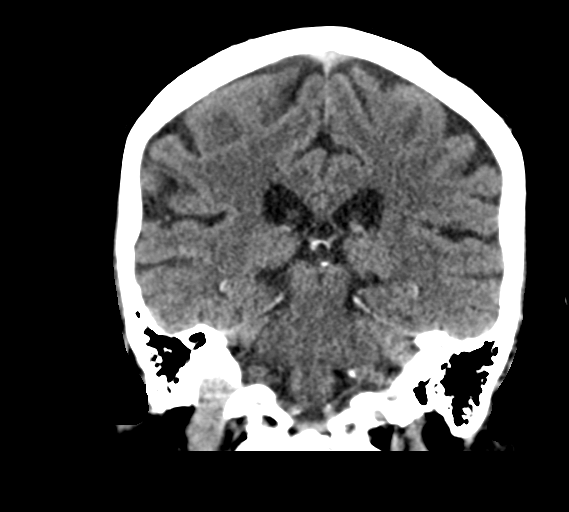
[im 37/67  brain]
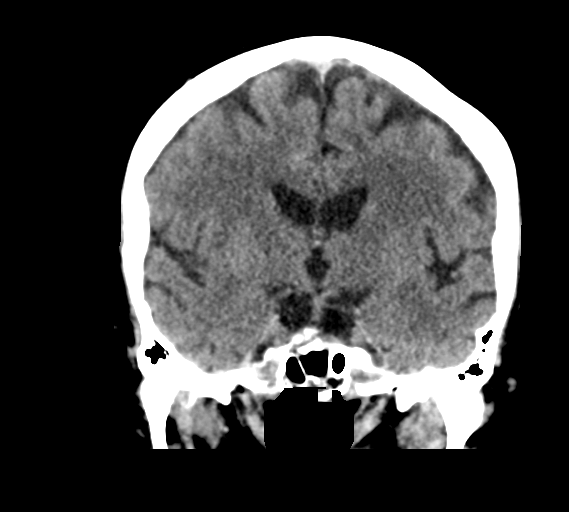

[Series 6: head without sag · sagittal · non-contrast · 0.32mm/px · 3 of 67 slices shown]
[im 23/67  brain]
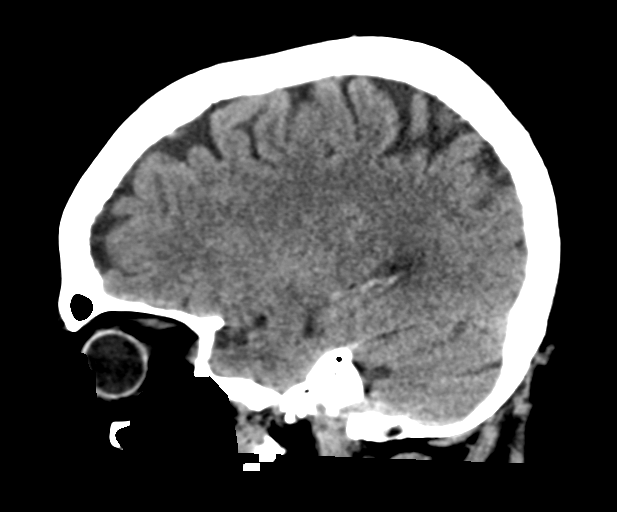
[im 34/67  brain]
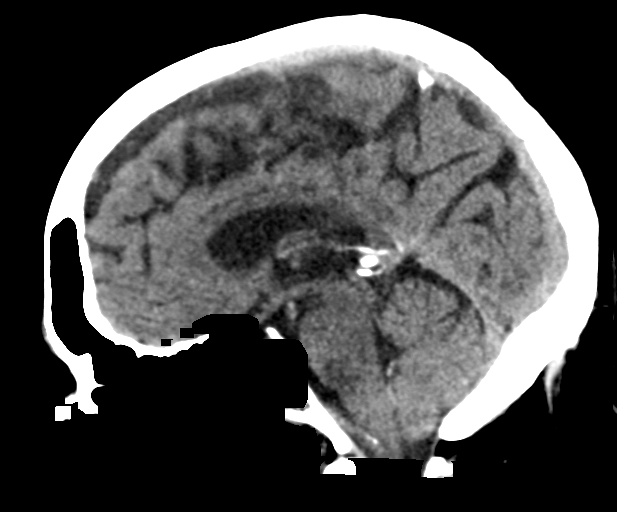
[im 45/67  brain]
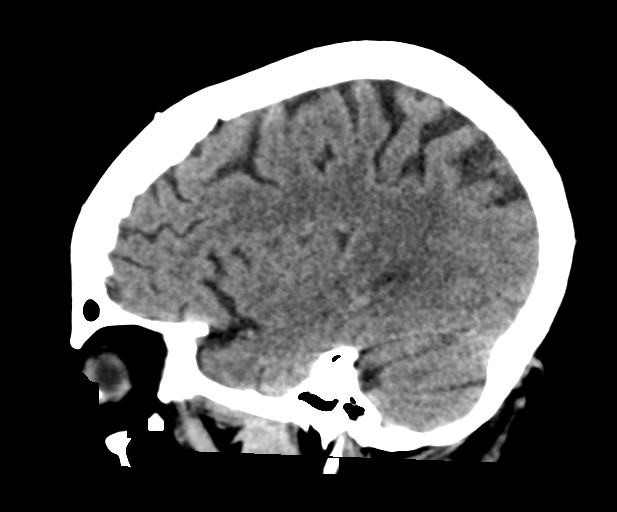

[17 of 47 positions shown; findings below may reference images not displayed]

FINDINGS: Brain: Mild age-related atrophy and chronic microvascular ischemic
changes. There is no acute intracranial hemorrhage. No mass effect
or midline shift. No extra-axial fluid collection.

Vascular: No hyperdense vessel or unexpected calcification.

Skull: Normal. Negative for fracture or focal lesion.

Sinuses/Orbits: No acute finding.

Other: None
IMPRESSION: 1. No acute intracranial pathology.
2. Mild age-related atrophy and chronic microvascular ischemic
changes.

## 2022-11-22 DIAGNOSIS — N1832 Chronic kidney disease, stage 3b: Secondary | ICD-10-CM | POA: Diagnosis not present

## 2022-11-22 DIAGNOSIS — I6522 Occlusion and stenosis of left carotid artery: Secondary | ICD-10-CM | POA: Diagnosis not present

## 2022-11-22 DIAGNOSIS — E7800 Pure hypercholesterolemia, unspecified: Secondary | ICD-10-CM | POA: Diagnosis not present

## 2022-11-22 DIAGNOSIS — Z79891 Long term (current) use of opiate analgesic: Secondary | ICD-10-CM | POA: Diagnosis not present

## 2022-11-22 DIAGNOSIS — M5136 Other intervertebral disc degeneration, lumbar region: Secondary | ICD-10-CM | POA: Diagnosis not present

## 2022-11-22 DIAGNOSIS — K219 Gastro-esophageal reflux disease without esophagitis: Secondary | ICD-10-CM | POA: Diagnosis not present

## 2022-11-22 DIAGNOSIS — I5032 Chronic diastolic (congestive) heart failure: Secondary | ICD-10-CM | POA: Diagnosis not present

## 2022-11-22 DIAGNOSIS — I442 Atrioventricular block, complete: Secondary | ICD-10-CM | POA: Diagnosis not present

## 2022-11-22 DIAGNOSIS — I2510 Atherosclerotic heart disease of native coronary artery without angina pectoris: Secondary | ICD-10-CM | POA: Diagnosis not present

## 2022-11-22 DIAGNOSIS — R339 Retention of urine, unspecified: Secondary | ICD-10-CM | POA: Diagnosis not present

## 2022-11-22 DIAGNOSIS — E871 Hypo-osmolality and hyponatremia: Secondary | ICD-10-CM | POA: Diagnosis not present

## 2022-11-22 DIAGNOSIS — M549 Dorsalgia, unspecified: Secondary | ICD-10-CM | POA: Diagnosis not present

## 2022-11-22 DIAGNOSIS — J301 Allergic rhinitis due to pollen: Secondary | ICD-10-CM | POA: Diagnosis not present

## 2022-11-22 DIAGNOSIS — G4700 Insomnia, unspecified: Secondary | ICD-10-CM | POA: Diagnosis not present

## 2022-11-22 DIAGNOSIS — Z950 Presence of cardiac pacemaker: Secondary | ICD-10-CM | POA: Diagnosis not present

## 2022-11-22 DIAGNOSIS — E559 Vitamin D deficiency, unspecified: Secondary | ICD-10-CM | POA: Diagnosis not present

## 2022-11-22 DIAGNOSIS — R7303 Prediabetes: Secondary | ICD-10-CM | POA: Diagnosis not present

## 2022-11-22 DIAGNOSIS — K5901 Slow transit constipation: Secondary | ICD-10-CM | POA: Diagnosis not present

## 2022-11-22 DIAGNOSIS — M170 Bilateral primary osteoarthritis of knee: Secondary | ICD-10-CM | POA: Diagnosis not present

## 2022-11-22 DIAGNOSIS — I130 Hypertensive heart and chronic kidney disease with heart failure and stage 1 through stage 4 chronic kidney disease, or unspecified chronic kidney disease: Secondary | ICD-10-CM | POA: Diagnosis not present

## 2022-11-22 DIAGNOSIS — Z7982 Long term (current) use of aspirin: Secondary | ICD-10-CM | POA: Diagnosis not present

## 2022-11-28 DIAGNOSIS — R0981 Nasal congestion: Secondary | ICD-10-CM | POA: Diagnosis not present

## 2022-11-28 DIAGNOSIS — R059 Cough, unspecified: Secondary | ICD-10-CM | POA: Diagnosis not present

## 2022-11-28 DIAGNOSIS — R509 Fever, unspecified: Secondary | ICD-10-CM | POA: Diagnosis not present

## 2022-11-28 DIAGNOSIS — Z03818 Encounter for observation for suspected exposure to other biological agents ruled out: Secondary | ICD-10-CM | POA: Diagnosis not present

## 2022-12-15 DIAGNOSIS — Z79899 Other long term (current) drug therapy: Secondary | ICD-10-CM | POA: Diagnosis not present

## 2022-12-21 DIAGNOSIS — I130 Hypertensive heart and chronic kidney disease with heart failure and stage 1 through stage 4 chronic kidney disease, or unspecified chronic kidney disease: Secondary | ICD-10-CM | POA: Diagnosis not present

## 2022-12-21 DIAGNOSIS — D649 Anemia, unspecified: Secondary | ICD-10-CM | POA: Diagnosis not present

## 2022-12-28 DIAGNOSIS — I130 Hypertensive heart and chronic kidney disease with heart failure and stage 1 through stage 4 chronic kidney disease, or unspecified chronic kidney disease: Secondary | ICD-10-CM | POA: Diagnosis not present

## 2023-01-17 DIAGNOSIS — I130 Hypertensive heart and chronic kidney disease with heart failure and stage 1 through stage 4 chronic kidney disease, or unspecified chronic kidney disease: Secondary | ICD-10-CM | POA: Diagnosis not present

## 2023-01-17 DIAGNOSIS — D649 Anemia, unspecified: Secondary | ICD-10-CM | POA: Diagnosis not present

## 2023-01-18 DIAGNOSIS — I130 Hypertensive heart and chronic kidney disease with heart failure and stage 1 through stage 4 chronic kidney disease, or unspecified chronic kidney disease: Secondary | ICD-10-CM | POA: Diagnosis not present

## 2023-01-18 DIAGNOSIS — N1831 Chronic kidney disease, stage 3a: Secondary | ICD-10-CM | POA: Diagnosis not present

## 2023-01-18 DIAGNOSIS — K219 Gastro-esophageal reflux disease without esophagitis: Secondary | ICD-10-CM | POA: Diagnosis not present

## 2023-01-27 ENCOUNTER — Ambulatory Visit
Admission: RE | Admit: 2023-01-27 | Discharge: 2023-01-27 | Disposition: A | Discharge status: Home / Self Care | Payer: Medicare Other | Source: Ambulatory Visit | Attending: Family Medicine | Admitting: Family Medicine

## 2023-01-27 ENCOUNTER — Other Ambulatory Visit: Payer: Self-pay | Admitting: Family Medicine

## 2023-01-27 DIAGNOSIS — N183 Chronic kidney disease, stage 3 unspecified: Secondary | ICD-10-CM | POA: Diagnosis not present

## 2023-01-27 DIAGNOSIS — J189 Pneumonia, unspecified organism: Secondary | ICD-10-CM

## 2023-01-27 DIAGNOSIS — R051 Acute cough: Secondary | ICD-10-CM | POA: Diagnosis not present

## 2023-01-27 DIAGNOSIS — R059 Cough, unspecified: Secondary | ICD-10-CM | POA: Diagnosis not present

## 2023-01-27 DIAGNOSIS — R0602 Shortness of breath: Secondary | ICD-10-CM | POA: Diagnosis not present

## 2023-01-27 DIAGNOSIS — Z03818 Encounter for observation for suspected exposure to other biological agents ruled out: Secondary | ICD-10-CM | POA: Diagnosis not present

## 2023-02-02 DIAGNOSIS — I442 Atrioventricular block, complete: Secondary | ICD-10-CM | POA: Diagnosis not present

## 2023-02-02 DIAGNOSIS — E785 Hyperlipidemia, unspecified: Secondary | ICD-10-CM | POA: Diagnosis not present

## 2023-02-02 DIAGNOSIS — I495 Sick sinus syndrome: Secondary | ICD-10-CM | POA: Diagnosis not present

## 2023-02-02 DIAGNOSIS — J986 Disorders of diaphragm: Secondary | ICD-10-CM | POA: Diagnosis not present

## 2023-02-02 DIAGNOSIS — I779 Disorder of arteries and arterioles, unspecified: Secondary | ICD-10-CM | POA: Diagnosis not present

## 2023-02-02 DIAGNOSIS — R7303 Prediabetes: Secondary | ICD-10-CM | POA: Diagnosis not present

## 2023-02-02 DIAGNOSIS — E871 Hypo-osmolality and hyponatremia: Secondary | ICD-10-CM | POA: Diagnosis not present

## 2023-02-02 DIAGNOSIS — Z45018 Encounter for adjustment and management of other part of cardiac pacemaker: Secondary | ICD-10-CM | POA: Diagnosis not present

## 2023-02-02 DIAGNOSIS — I5042 Chronic combined systolic (congestive) and diastolic (congestive) heart failure: Secondary | ICD-10-CM | POA: Diagnosis not present

## 2023-02-14 DIAGNOSIS — D649 Anemia, unspecified: Secondary | ICD-10-CM | POA: Diagnosis not present

## 2023-02-14 DIAGNOSIS — I130 Hypertensive heart and chronic kidney disease with heart failure and stage 1 through stage 4 chronic kidney disease, or unspecified chronic kidney disease: Secondary | ICD-10-CM | POA: Diagnosis not present

## 2023-02-15 DIAGNOSIS — R052 Subacute cough: Secondary | ICD-10-CM | POA: Diagnosis not present

## 2023-02-15 DIAGNOSIS — I5032 Chronic diastolic (congestive) heart failure: Secondary | ICD-10-CM | POA: Diagnosis not present

## 2023-02-15 DIAGNOSIS — J309 Allergic rhinitis, unspecified: Secondary | ICD-10-CM | POA: Diagnosis not present

## 2023-02-22 DIAGNOSIS — M170 Bilateral primary osteoarthritis of knee: Secondary | ICD-10-CM | POA: Diagnosis not present

## 2023-03-08 DIAGNOSIS — I5032 Chronic diastolic (congestive) heart failure: Secondary | ICD-10-CM | POA: Diagnosis not present

## 2023-03-08 DIAGNOSIS — I130 Hypertensive heart and chronic kidney disease with heart failure and stage 1 through stage 4 chronic kidney disease, or unspecified chronic kidney disease: Secondary | ICD-10-CM | POA: Diagnosis not present

## 2023-03-08 DIAGNOSIS — D649 Anemia, unspecified: Secondary | ICD-10-CM | POA: Diagnosis not present

## 2023-03-08 DIAGNOSIS — J309 Allergic rhinitis, unspecified: Secondary | ICD-10-CM | POA: Diagnosis not present

## 2023-04-05 DIAGNOSIS — I5032 Chronic diastolic (congestive) heart failure: Secondary | ICD-10-CM | POA: Diagnosis not present

## 2023-04-05 DIAGNOSIS — J309 Allergic rhinitis, unspecified: Secondary | ICD-10-CM | POA: Diagnosis not present

## 2023-04-17 DIAGNOSIS — I509 Heart failure, unspecified: Secondary | ICD-10-CM | POA: Diagnosis not present

## 2023-04-17 DIAGNOSIS — R339 Retention of urine, unspecified: Secondary | ICD-10-CM | POA: Diagnosis not present

## 2023-04-17 DIAGNOSIS — K219 Gastro-esophageal reflux disease without esophagitis: Secondary | ICD-10-CM | POA: Diagnosis not present

## 2023-04-17 DIAGNOSIS — I130 Hypertensive heart and chronic kidney disease with heart failure and stage 1 through stage 4 chronic kidney disease, or unspecified chronic kidney disease: Secondary | ICD-10-CM | POA: Diagnosis not present

## 2023-04-17 DIAGNOSIS — I110 Hypertensive heart disease with heart failure: Secondary | ICD-10-CM | POA: Diagnosis not present

## 2023-04-17 DIAGNOSIS — D649 Anemia, unspecified: Secondary | ICD-10-CM | POA: Diagnosis not present

## 2023-04-17 DIAGNOSIS — E871 Hypo-osmolality and hyponatremia: Secondary | ICD-10-CM | POA: Diagnosis not present

## 2023-05-04 DIAGNOSIS — I442 Atrioventricular block, complete: Secondary | ICD-10-CM | POA: Diagnosis not present

## 2023-05-04 DIAGNOSIS — Z45018 Encounter for adjustment and management of other part of cardiac pacemaker: Secondary | ICD-10-CM | POA: Diagnosis not present

## 2023-05-18 DIAGNOSIS — D649 Anemia, unspecified: Secondary | ICD-10-CM | POA: Diagnosis not present

## 2023-05-18 DIAGNOSIS — I130 Hypertensive heart and chronic kidney disease with heart failure and stage 1 through stage 4 chronic kidney disease, or unspecified chronic kidney disease: Secondary | ICD-10-CM | POA: Diagnosis not present

## 2023-05-24 ENCOUNTER — Other Ambulatory Visit: Payer: Self-pay

## 2023-05-24 MED ORDER — SACUBITRIL-VALSARTAN 24-26 MG PO TABS: 1.0000 | ORAL_TABLET | Freq: Two times a day (BID) | ORAL | 0 refills | Status: DC

## 2023-05-24 MED ORDER — SACUBITRIL-VALSARTAN 24-26 MG PO TABS: 1.0000 | ORAL_TABLET | Freq: Two times a day (BID) | ORAL | 6 refills | Status: DC

## 2023-06-16 ENCOUNTER — Ambulatory Visit: Payer: Medicare Other | Admitting: Cardiology

## 2023-07-13 DIAGNOSIS — Z79899 Other long term (current) drug therapy: Secondary | ICD-10-CM | POA: Diagnosis not present

## 2023-07-13 DIAGNOSIS — R7303 Prediabetes: Secondary | ICD-10-CM | POA: Diagnosis not present

## 2023-07-13 DIAGNOSIS — H6121 Impacted cerumen, right ear: Secondary | ICD-10-CM | POA: Diagnosis not present

## 2023-07-13 DIAGNOSIS — I442 Atrioventricular block, complete: Secondary | ICD-10-CM | POA: Diagnosis not present

## 2023-07-13 DIAGNOSIS — R059 Cough, unspecified: Secondary | ICD-10-CM | POA: Diagnosis not present

## 2023-07-13 DIAGNOSIS — Z0000 Encounter for general adult medical examination without abnormal findings: Secondary | ICD-10-CM | POA: Diagnosis not present

## 2023-07-13 DIAGNOSIS — Z9181 History of falling: Secondary | ICD-10-CM | POA: Diagnosis not present

## 2023-07-13 DIAGNOSIS — M818 Other osteoporosis without current pathological fracture: Secondary | ICD-10-CM | POA: Diagnosis not present

## 2023-07-13 DIAGNOSIS — E785 Hyperlipidemia, unspecified: Secondary | ICD-10-CM | POA: Diagnosis not present

## 2023-07-13 DIAGNOSIS — M810 Age-related osteoporosis without current pathological fracture: Secondary | ICD-10-CM | POA: Diagnosis not present

## 2023-09-02 DIAGNOSIS — Z45018 Encounter for adjustment and management of other part of cardiac pacemaker: Secondary | ICD-10-CM | POA: Diagnosis not present

## 2023-09-02 DIAGNOSIS — I442 Atrioventricular block, complete: Secondary | ICD-10-CM | POA: Diagnosis not present

## 2023-12-04 ENCOUNTER — Ambulatory Visit: Payer: Medicare Other

## 2023-12-04 DIAGNOSIS — I442 Atrioventricular block, complete: Secondary | ICD-10-CM

## 2023-12-05 LAB — CUP PACEART REMOTE DEVICE CHECK
Battery Remaining Longevity: 84 mo
Battery Remaining Percentage: 81 %
Battery Voltage: 3.01 V
Brady Statistic AP VP Percent: 5.9 %
Brady Statistic AP VS Percent: 1 %
Brady Statistic AS VP Percent: 91 %
Brady Statistic AS VS Percent: 1.7 %
Brady Statistic RA Percent Paced: 4.5 %
Brady Statistic RV Percent Paced: 97 %
Date Time Interrogation Session: 20241214020022
Implantable Lead Connection Status: 753985
Implantable Lead Connection Status: 753985
Implantable Lead Implant Date: 20221121
Implantable Lead Implant Date: 20221121
Implantable Lead Location: 753859
Implantable Lead Location: 753860
Implantable Pulse Generator Implant Date: 20221121
Lead Channel Impedance Value: 460 Ohm
Lead Channel Impedance Value: 510 Ohm
Lead Channel Pacing Threshold Amplitude: 0.5 V
Lead Channel Pacing Threshold Amplitude: 1 V
Lead Channel Pacing Threshold Pulse Width: 0.5 ms
Lead Channel Pacing Threshold Pulse Width: 0.5 ms
Lead Channel Sensing Intrinsic Amplitude: 5 mV
Lead Channel Sensing Intrinsic Amplitude: 9.4 mV
Lead Channel Setting Pacing Amplitude: 2.5 V
Lead Channel Setting Pacing Amplitude: 2.5 V
Lead Channel Setting Pacing Pulse Width: 0.5 ms
Lead Channel Setting Sensing Sensitivity: 4 mV
Pulse Gen Model: 2272
Pulse Gen Serial Number: 3975389

## 2024-01-11 NOTE — Addendum Note (Signed)
Addended by: Geralyn Flash D on: 01/11/2024 11:10 AM   Modules accepted: Orders

## 2024-01-11 NOTE — Progress Notes (Signed)
Remote pacemaker transmission.   

## 2024-02-06 ENCOUNTER — Ambulatory Visit: Payer: Medicare Other | Admitting: Cardiology

## 2024-02-08 ENCOUNTER — Ambulatory Visit: Payer: Medicare Other | Admitting: Cardiology

## 2024-02-15 DIAGNOSIS — I5042 Chronic combined systolic (congestive) and diastolic (congestive) heart failure: Secondary | ICD-10-CM | POA: Diagnosis not present

## 2024-02-15 DIAGNOSIS — I10 Essential (primary) hypertension: Secondary | ICD-10-CM | POA: Diagnosis not present

## 2024-02-15 DIAGNOSIS — E785 Hyperlipidemia, unspecified: Secondary | ICD-10-CM | POA: Diagnosis not present

## 2024-02-15 DIAGNOSIS — H9193 Unspecified hearing loss, bilateral: Secondary | ICD-10-CM | POA: Diagnosis not present

## 2024-03-04 ENCOUNTER — Ambulatory Visit: Payer: Medicare Other

## 2024-03-04 DIAGNOSIS — I442 Atrioventricular block, complete: Secondary | ICD-10-CM | POA: Diagnosis not present

## 2024-03-06 LAB — CUP PACEART REMOTE DEVICE CHECK
Battery Remaining Longevity: 83 mo
Battery Remaining Percentage: 79 %
Battery Voltage: 3.01 V
Brady Statistic AP VP Percent: 6.2 %
Brady Statistic AP VS Percent: 1 %
Brady Statistic AS VP Percent: 91 %
Brady Statistic AS VS Percent: 1.2 %
Brady Statistic RA Percent Paced: 5.1 %
Brady Statistic RV Percent Paced: 98 %
Date Time Interrogation Session: 20250317124423
Implantable Lead Connection Status: 753985
Implantable Lead Connection Status: 753985
Implantable Lead Implant Date: 20221121
Implantable Lead Implant Date: 20221121
Implantable Lead Location: 753859
Implantable Lead Location: 753860
Implantable Pulse Generator Implant Date: 20221121
Lead Channel Impedance Value: 450 Ohm
Lead Channel Impedance Value: 550 Ohm
Lead Channel Pacing Threshold Amplitude: 0.5 V
Lead Channel Pacing Threshold Amplitude: 1 V
Lead Channel Pacing Threshold Pulse Width: 0.5 ms
Lead Channel Pacing Threshold Pulse Width: 0.5 ms
Lead Channel Sensing Intrinsic Amplitude: 5 mV
Lead Channel Sensing Intrinsic Amplitude: 9 mV
Lead Channel Setting Pacing Amplitude: 2.5 V
Lead Channel Setting Pacing Amplitude: 2.5 V
Lead Channel Setting Pacing Pulse Width: 0.5 ms
Lead Channel Setting Sensing Sensitivity: 4 mV
Pulse Gen Model: 2272
Pulse Gen Serial Number: 3975389

## 2024-03-18 ENCOUNTER — Encounter: Payer: Self-pay | Admitting: Cardiology

## 2024-03-18 ENCOUNTER — Ambulatory Visit: Payer: Medicare Other | Attending: Cardiology | Admitting: Cardiology

## 2024-03-18 VITALS — BP 150/72 | HR 76 | Ht <= 58 in | Wt 155.0 lb

## 2024-03-18 DIAGNOSIS — I442 Atrioventricular block, complete: Secondary | ICD-10-CM

## 2024-03-18 DIAGNOSIS — I10 Essential (primary) hypertension: Secondary | ICD-10-CM | POA: Diagnosis not present

## 2024-03-18 LAB — CUP PACEART INCLINIC DEVICE CHECK
Battery Remaining Longevity: 85 mo
Battery Voltage: 3.01 V
Brady Statistic RA Percent Paced: 4.9 %
Brady Statistic RV Percent Paced: 98 %
Date Time Interrogation Session: 20250331163506
Implantable Lead Connection Status: 753985
Implantable Lead Connection Status: 753985
Implantable Lead Implant Date: 20221121
Implantable Lead Implant Date: 20221121
Implantable Lead Location: 753859
Implantable Lead Location: 753860
Implantable Pulse Generator Implant Date: 20221121
Lead Channel Impedance Value: 425 Ohm
Lead Channel Impedance Value: 550 Ohm
Lead Channel Pacing Threshold Amplitude: 0.25 V
Lead Channel Pacing Threshold Amplitude: 0.25 V
Lead Channel Pacing Threshold Amplitude: 0.25 V
Lead Channel Pacing Threshold Amplitude: 0.25 V
Lead Channel Pacing Threshold Pulse Width: 0.5 ms
Lead Channel Pacing Threshold Pulse Width: 0.5 ms
Lead Channel Pacing Threshold Pulse Width: 0.5 ms
Lead Channel Pacing Threshold Pulse Width: 0.5 ms
Lead Channel Sensing Intrinsic Amplitude: 12 mV
Lead Channel Sensing Intrinsic Amplitude: 5 mV
Lead Channel Setting Pacing Amplitude: 2 V
Lead Channel Setting Pacing Amplitude: 2.5 V
Lead Channel Setting Pacing Pulse Width: 0.5 ms
Lead Channel Setting Sensing Sensitivity: 4 mV
Pulse Gen Model: 2272
Pulse Gen Serial Number: 3975389

## 2024-03-18 NOTE — Progress Notes (Signed)
  Electrophysiology Office Note:   Date:  03/18/2024  ID:  Deborah Jordan, DOB 1933-09-27, MRN [id number]  Primary Cardiologist: None Primary Heart Failure: None Electrophysiologist: Hadriel Northup [name], MD      History of Present Illness:   Deborah Jordan is a 89 y.o. female with h/o hypertension, hyperlipidemia, CKD, complete heart block seen today for routine electrophysiology followup.   Since last being seen in our clinic the patient reports doing well.  She has no acute complaints.  She is able to do all of her daily activities without restriction.  Per her family and her, she has no acute issues at this time.  she denies chest pain, palpitations, dyspnea, PND, orthopnea, nausea, vomiting, dizziness, syncope, edema, weight gain, or early satiety.   Review of systems complete and found to be negative unless listed in HPI.      EP Information / Studies Reviewed:    EKG is ordered today. Personal review as below.  EKG Interpretation Date/Time:  Monday March 18 2024 14:45:28 EDT Ventricular Rate:  76 PR Interval:  226 QRS Duration:  154 QT Interval:  436 QTC Calculation: 490 R Axis:   252  Text Interpretation: Atrial-sensed ventricular-paced rhythm with prolonged AV conduction When compared with ECG of 06-Nov-2021 06:36, Vent. rate has decreased BY  12 BPM Confirmed by Trezure Cronk ([id number]) on 03/18/2024 3:09:35 PM   PPM Interrogation-  reviewed in detail today,  See PACEART report.  Device History: Abbott Dual Chamber PPM implanted 11/05/2021 for CHB  Risk Assessment/Calculations:             Physical Exam:   VS:  BP (!) 150/72 (BP Location: Left Arm, Patient Position: Sitting, Cuff Size: Normal)   Pulse 76   Ht 4\' 9"  (1.448 m)   Wt 155 lb (70.3 kg)   SpO2 97%   BMI 33.54 kg/m    Wt Readings from Last 3 Encounters:  03/18/24 155 lb (70.3 kg)  11/02/22 187 lb (84.8 kg)  06/15/22 173 lb (78.5 kg)     GEN: Well nourished, well developed in no acute distress NECK:  No JVD; No carotid bruits CARDIAC: Regular rate and rhythm, no murmurs, rubs, gallops RESPIRATORY:  Clear to auscultation without rales, wheezing or rhonchi  ABDOMEN: Soft, non-tender, non-distended EXTREMITIES:  No edema; No deformity   ASSESSMENT AND PLAN:    CHB s/p Abbott PPM  Normal PPM function See Pace Art report Sensing, Threshold, impedance within normal limits Device program stable and appropriate for patient No changes today  2.  Hypertension: Evaded today.  Usually well-controlled.  No changes.  3.  CKD stage IIIa: Creatinine is remained stable.  Plan per primary physician  Disposition:   Follow up with EP APP in 12 months  Signed, Hadas Jessop [name], MD

## 2024-04-25 NOTE — Progress Notes (Signed)
 Remote pacemaker transmission.

## 2024-04-25 NOTE — Addendum Note (Signed)
 Addended by: Edra Govern D on: 04/25/2024 09:44 AM   Modules accepted: Orders

## 2024-05-09 ENCOUNTER — Encounter: Payer: Self-pay | Admitting: Cardiology

## 2024-05-09 ENCOUNTER — Ambulatory Visit: Attending: Cardiology | Admitting: Cardiology

## 2024-05-09 VITALS — BP 128/60 | HR 72 | Ht <= 58 in | Wt 155.0 lb

## 2024-05-09 DIAGNOSIS — I341 Nonrheumatic mitral (valve) prolapse: Secondary | ICD-10-CM | POA: Diagnosis not present

## 2024-05-09 DIAGNOSIS — E7800 Pure hypercholesterolemia, unspecified: Secondary | ICD-10-CM | POA: Diagnosis not present

## 2024-05-09 DIAGNOSIS — I5032 Chronic diastolic (congestive) heart failure: Secondary | ICD-10-CM

## 2024-05-09 DIAGNOSIS — I10 Essential (primary) hypertension: Secondary | ICD-10-CM | POA: Diagnosis not present

## 2024-05-09 DIAGNOSIS — I6522 Occlusion and stenosis of left carotid artery: Secondary | ICD-10-CM

## 2024-05-09 MED ORDER — VALSARTAN 80 MG PO TABS: 80.0000 mg | ORAL_TABLET | Freq: Every day | ORAL | 3 refills | Status: AC

## 2024-05-09 MED ORDER — ATORVASTATIN CALCIUM 10 MG PO TABS: 10.0000 mg | ORAL_TABLET | Freq: Every day | ORAL | 3 refills | Status: AC

## 2024-05-09 NOTE — Progress Notes (Signed)
 Cardiology Office Note:  .   Date:  05/09/2024  ID:  Deborah Jordan, DOB [birth year], MRN [id number] PCP: Olin Bertin, MD  [facility] Providers Cardiologist:  Knox Perl, MD Electrophysiologist:  Lei Pump, MD   History of Present Illness: .   Deborah Jordan is a 89 y.o. Caucasian female patient with hypertension, hypercholesterolemia, chronic diastolic heart failure and complete heart block SP dual-chamber pacemaker implantation on 11/05/2021 presents for annual visit.  She has had very mild 16 to 49% stenosis in the left ICA by duplex in 23 which has been stable since a year ago.  Presently not on a statin.  Discussed the use of AI scribe software for clinical note transcription with the patient, who gave verbal consent to proceed.  History of Present Illness Deborah Jordan is an 89 year old female with a history of heart block and severe mitral valve leak who presents for follow-up of her cardiovascular health. She is accompanied by her son-in-law.  She remains stable with no recent cardiac concerns and notes significant improvement since her initial presentation. She has lost 30 pounds since moving to her daughter's house, attributing this to dietary changes. She is currently taking Entresto  but finds the cost prohibitive. Her medications include amlodipine  10 mg daily for blood pressure. She has not taken Lipitor for five to six months due to a lack of prescription refill. No shortness of breath, irregular heartbeat, or atrial fibrillation.  Labs   External Labs:  PCP labs 07/17/2023:  A1c 5.5%.  TSH normal at 2.57.  Total cholesterol 179, triglycerides 70, HDL 62, LDL 104.  Hb 12.2/HCT 37.3, platelets 392.  Serum Glucose 112 mg, BUN 19, creatinine 0.90, EGFR 61 mL, potassium 4.5, sodium 136, LFTs normal.  ROS  Review of Systems  Cardiovascular:  Negative for chest pain, dyspnea on exertion and leg swelling.    Physical Exam:   VS:  BP 128/60   Pulse 72    Ht 4\' 10"  (1.473 m)   Wt 155 lb (70.3 kg)   SpO2 93%   BMI 32.40 kg/m    Wt Readings from Last 3 Encounters:  05/09/24 155 lb (70.3 kg)  03/18/24 155 lb (70.3 kg)  11/02/22 187 lb (84.8 kg)    Physical Exam Neck:     Vascular: No JVD.  Cardiovascular:     Rate and Rhythm: Normal rate and regular rhythm.     Pulses: Intact distal pulses.     Heart sounds: S1 normal and S2 normal. Murmur heard.     Mid to late systolic murmur is present with a grade of 2/6 at the apex.     No gallop.  Pulmonary:     Effort: Pulmonary effort is normal.     Breath sounds: Normal breath sounds.  Abdominal:     General: Bowel sounds are normal.     Palpations: Abdomen is soft.  Musculoskeletal:     Right lower leg: No edema.     Left lower leg: No edema.    Studies Reviewed: .    NA EKG:         Medications and allergies    Allergies  Allergen Reactions   Ace Inhibitors Cough   Hydrochlorothiazide Other (See Comments)    Low Sodium     Current Outpatient Medications:    acetaminophen  (TYLENOL ) 650 MG CR tablet, Take 650 mg by mouth every 8 (eight) hours as needed for pain., Disp: , Rfl:  amLODipine  (NORVASC ) 5 MG tablet, Take 5 mg by mouth daily., Disp: 15 tablet, Rfl: 2   aspirin  EC 81 MG tablet, Take 81 mg by mouth daily. Swallow whole., Disp: , Rfl:    atorvastatin  (LIPITOR) 10 MG tablet, Take 1 tablet (10 mg total) by mouth daily., Disp: 90 tablet, Rfl: 3   Cholecalciferol (VITAMIN D) 2000 units CAPS, Take 2,000 Units by mouth daily., Disp: , Rfl:    DULoxetine  (CYMBALTA ) 20 MG capsule, Take 1 capsule (20 mg total) by mouth daily., Disp: 90 capsule, Rfl: 3   esomeprazole (NEXIUM) 40 MG capsule, Take 40 mg by mouth daily., Disp: , Rfl:    gabapentin  (NEURONTIN ) 100 MG capsule, Take 1 capsule (100 mg total) by mouth at bedtime., Disp: 90 capsule, Rfl: 3   magnesium  oxide (MAG-OX) 400 (240 Mg) MG tablet, Take 1 tablet (400 mg total) by mouth 2 (two) times daily., Disp: 60  tablet, Rfl: 0   sodium chloride  1 g tablet, Take 2 tablets (2 g total) by mouth 3 (three) times daily with meals., Disp: 180 tablet, Rfl: 0   tamsulosin  (FLOMAX ) 0.4 MG CAPS capsule, Take 0.4 mg by mouth daily., Disp: , Rfl:    valsartan  (DIOVAN ) 80 MG tablet, Take 1 tablet (80 mg total) by mouth daily., Disp: 90 tablet, Rfl: 3   loratadine (CLARITIN) 10 MG tablet, Take 10 mg by mouth daily. (Patient not taking: Reported on 05/09/2024), Disp: , Rfl:    Meds ordered this encounter  Medications   atorvastatin  (LIPITOR) 10 MG tablet    Sig: Take 1 tablet (10 mg total) by mouth daily.    Dispense:  90 tablet    Refill:  3    Dose decrease   valsartan  (DIOVAN ) 80 MG tablet    Sig: Take 1 tablet (80 mg total) by mouth daily.    Dispense:  90 tablet    Refill:  3    Patient to stop Entresto      Medications Discontinued During This Encounter  Medication Reason   fluticasone (FLONASE) 50 MCG/ACT nasal spray Patient has not taken in last 30 days   ALPRAZolam  (XANAX ) 0.25 MG tablet    atorvastatin  (LIPITOR) 20 MG tablet    sacubitril -valsartan  (ENTRESTO ) 24-26 MG      ASSESSMENT AND PLAN: .      ICD-10-CM   1. Chronic diastolic congestive heart failure (HCC)  I50.32 ECHOCARDIOGRAM COMPLETE    2. Asymptomatic stenosis of left carotid artery  I65.22 VAS US  CAROTID    3. Essential hypertension  I10 valsartan  (DIOVAN ) 80 MG tablet    4. MVP (mitral valve prolapse)  I34.1 ECHOCARDIOGRAM COMPLETE    5. Mild hypercholesterolemia  E78.00 atorvastatin  (LIPITOR) 10 MG tablet      Assessment and Plan Assessment & Plan Chronic diastolic heart failure   Chronic diastolic heart failure was previously linked to complete heart block and severe mitral valve leak in 2022, resolved after pacing, and her condition is well-managed. Discontinue Entresto  due to cost and effective heart function management. Prescribe valsartan  80 mg once daily as a replacement. Order a repeat echocardiogram to assess the  mitral valve status.  She does have mitral valve prolapse and myxomatous mitral valve that was noted previously on echocardiogram in 2022.  Presence of cardiac pacemaker   A dual chamber pacemaker was placed on November 05, 2021, for complete heart block. She is followed by EP doctors with no reported arrhythmias, and the pacemaker is functioning well.  Atherosclerosis of carotid  artery   There is mild stenosis in the left carotid artery, posing a risk of cerebrovascular accident due to carotid artery atherosclerosis. Order a carotid duplex ultrasound to assess the current status of the carotid artery. Prescribe aspirin  81 mg once daily for stroke prevention.  Hyperlipidemia   Hyperlipidemia is present with mild carotid artery stenosis. She was previously on atorvastatin  20 mg but has not taken it for several months.  Recent lipid profile reviewed, she has mild hypercholesterolemia.  Will reduce the dose of atorvastatin  to 10 mg daily.  Dietary changes have contributed to weight loss. Prescribe atorvastatin  10 mg once daily to manage dyslipidemia, providing a 90-day supply with three refills.  Otherwise she remains stable from cardiac standpoint without any recurrence of heart failure and blood pressure is well-controlled, I will see her back on an annual basis and if she remains stable I will probably see her back on a as needed basis.   Signed,  Knox Perl, MD, [facility] 05/09/2024, 5:24 PM [facility] [address] [place], [place] [address] Phone: [phone]. Fax:  [phone]

## 2024-05-09 NOTE — Patient Instructions (Signed)
 Medication Instructions:  Your physician has recommended you make the following change in your medication: Decrease atorvastatin  to 10 mg by mouth daily  Start aspirin  81 mg by mouth daily  Stop Entresto  Start Valsartan  80 mg by mouth daily  *If you need a refill on your cardiac medications before your next appointment, please call your pharmacy*  Lab Work: none If you have labs (blood work) drawn today and your tests are completely normal, you will receive your results only by: MyChart Message (if you have MyChart) OR A paper copy in the mail If you have any lab test that is abnormal or we need to change your treatment, we will call you to review the results.  Testing/Procedures: Your physician has requested that you have a carotid duplex. This test is an ultrasound of the carotid arteries in your neck. It looks at blood flow through these arteries that supply the brain with blood. Allow one hour for this exam. There are no restrictions or special instructions.  Your physician has requested that you have an echocardiogram. Echocardiography is a painless test that uses sound waves to create images of your heart. It provides your doctor with information about the size and shape of your heart and how well your heart's chambers and valves are working. This procedure takes approximately one hour. There are no restrictions for this procedure. Please do NOT wear cologne, perfume, aftershave, or lotions (deodorant is allowed). Please arrive 15 minutes prior to your appointment time.  Please note: We ask at that you not bring children with you during ultrasound (echo/ vascular) testing. Due to room size and safety concerns, children are not allowed in the ultrasound rooms during exams. Our front office staff cannot provide observation of children in our lobby area while testing is being conducted. An adult accompanying a patient to their appointment will only be allowed in the ultrasound room at the  discretion of the ultrasound technician under special circumstances. We apologize for any inconvenience.   Follow-Up: At [facility], you and your health needs are our priority.  As part of our continuing mission to provide you with exceptional heart care, our providers are all part of one team.  This team includes your primary Cardiologist (physician) and Advanced Practice Providers or APPs (Physician Assistants and Nurse Practitioners) who all work together to provide you with the care you need, when you need it.  Your next appointment:   12 month(s)  Provider:   Knox Perl, MD    We recommend signing up for the patient portal called "MyChart".  Sign up information is provided on this After Visit Summary.  MyChart is used to connect with patients for Virtual Visits (Telemedicine).  Patients are able to view lab/test results, encounter notes, upcoming appointments, etc.  Non-urgent messages can be sent to your provider as well.   To learn more about what you can do with MyChart, go to [email or web address].   Other Instructions

## 2024-06-03 ENCOUNTER — Ambulatory Visit: Payer: Medicare Other

## 2024-06-03 DIAGNOSIS — I442 Atrioventricular block, complete: Secondary | ICD-10-CM

## 2024-06-03 LAB — CUP PACEART REMOTE DEVICE CHECK
Battery Remaining Longevity: 80 mo
Battery Remaining Percentage: 76 %
Battery Voltage: 3.01 V
Brady Statistic AP VP Percent: 6.1 %
Brady Statistic AP VS Percent: 1 %
Brady Statistic AS VP Percent: 92 %
Brady Statistic AS VS Percent: 1 %
Brady Statistic RA Percent Paced: 5.4 %
Brady Statistic RV Percent Paced: 98 %
Date Time Interrogation Session: 20250616020015
Implantable Lead Connection Status: 753985
Implantable Lead Connection Status: 753985
Implantable Lead Implant Date: 20221121
Implantable Lead Implant Date: 20221121
Implantable Lead Location: 753859
Implantable Lead Location: 753860
Implantable Pulse Generator Implant Date: 20221121
Lead Channel Impedance Value: 460 Ohm
Lead Channel Impedance Value: 550 Ohm
Lead Channel Pacing Threshold Amplitude: 0.25 V
Lead Channel Pacing Threshold Amplitude: 0.25 V
Lead Channel Pacing Threshold Pulse Width: 0.5 ms
Lead Channel Pacing Threshold Pulse Width: 0.5 ms
Lead Channel Sensing Intrinsic Amplitude: 12 mV
Lead Channel Sensing Intrinsic Amplitude: 5 mV
Lead Channel Setting Pacing Amplitude: 2 V
Lead Channel Setting Pacing Amplitude: 2.5 V
Lead Channel Setting Pacing Pulse Width: 0.5 ms
Lead Channel Setting Sensing Sensitivity: 4 mV
Pulse Gen Model: 2272
Pulse Gen Serial Number: 3975389

## 2024-06-06 ENCOUNTER — Ambulatory Visit: Payer: Self-pay | Admitting: Cardiology

## 2024-06-18 ENCOUNTER — Ambulatory Visit (HOSPITAL_COMMUNITY)
Admission: RE | Admit: 2024-06-18 | Discharge: 2024-06-18 | Disposition: A | Discharge status: Home / Self Care | Source: Ambulatory Visit | Attending: Cardiology | Admitting: Cardiology

## 2024-06-18 ENCOUNTER — Ambulatory Visit: Payer: Self-pay | Admitting: Cardiology

## 2024-06-18 ENCOUNTER — Ambulatory Visit (HOSPITAL_BASED_OUTPATIENT_CLINIC_OR_DEPARTMENT_OTHER)
Admission: RE | Admit: 2024-06-18 | Discharge: 2024-06-18 | Disposition: A | Discharge status: Home / Self Care | Source: Ambulatory Visit | Attending: Cardiology | Admitting: Cardiology

## 2024-06-18 DIAGNOSIS — R072 Precordial pain: Secondary | ICD-10-CM

## 2024-06-18 DIAGNOSIS — I6522 Occlusion and stenosis of left carotid artery: Secondary | ICD-10-CM | POA: Diagnosis not present

## 2024-06-18 DIAGNOSIS — I5032 Chronic diastolic (congestive) heart failure: Secondary | ICD-10-CM

## 2024-06-18 DIAGNOSIS — I341 Nonrheumatic mitral (valve) prolapse: Secondary | ICD-10-CM | POA: Insufficient documentation

## 2024-06-18 DIAGNOSIS — R011 Cardiac murmur, unspecified: Secondary | ICD-10-CM

## 2024-06-18 LAB — ECHOCARDIOGRAM COMPLETE
AR max vel: 1.68 cm2
AV Area VTI: 1.73 cm2
AV Area mean vel: 1.83 cm2
AV Mean grad: 7 mmHg
AV Peak grad: 14.1 mmHg
Ao pk vel: 1.88 m/s
Area-P 1/2: 2.52 cm2
MV VTI: 1.72 cm2
S' Lateral: 2.63 cm

## 2024-06-18 NOTE — Progress Notes (Signed)
 Carotid artery duplex 06/18/2024: Mild diffuse 1 to 39% heterogenous plaque in bilateral internal carotid arteries. Antegrade vertebral artery flow. Normal subclavian flow pattern.  No further follow-up is indicated with regard to carotid disease, minor plaque is noted, continue present medical therapy.

## 2024-06-18 NOTE — Progress Notes (Signed)
 Essentially normal echocardiogram for age with low normal LVEF at 50 to 55% with mild diastolic dysfunction, no significant valvular abnormality.  The aortic valve shows mild aortic sclerosis and thickening probably age-related and is the etiology for her murmur. Diastolic function means the heart has to relax to receive the blood so it can pump the blood out. If relaxation is impaired, then the blood coming to the heart is restricted and can cause dyspnea and reduced functional capacity and fluid build up. Exercise, weight loss, control of BP, salt restriction will improve this.

## 2024-07-12 NOTE — Progress Notes (Signed)
 Remote pacemaker transmission.

## 2024-09-02 ENCOUNTER — Ambulatory Visit: Payer: Medicare Other

## 2024-09-02 DIAGNOSIS — I442 Atrioventricular block, complete: Secondary | ICD-10-CM | POA: Diagnosis not present

## 2024-10-23 LAB — CUP PACEART REMOTE DEVICE CHECK
Battery Remaining Longevity: 78 mo
Battery Remaining Percentage: 74 %
Battery Voltage: 3.01 V
Brady Statistic AP VP Percent: 7.2 %
Brady Statistic AP VS Percent: 1 %
Brady Statistic AS VP Percent: 92 %
Brady Statistic AS VS Percent: 1 %
Brady Statistic RA Percent Paced: 6.7 %
Brady Statistic RV Percent Paced: 99 %
Date Time Interrogation Session: 20250915020013
Implantable Lead Connection Status: 753985
Implantable Lead Connection Status: 753985
Implantable Lead Implant Date: 20221121
Implantable Lead Implant Date: 20221121
Implantable Lead Location: 753859
Implantable Lead Location: 753860
Implantable Pulse Generator Implant Date: 20221121
Lead Channel Impedance Value: 480 Ohm
Lead Channel Impedance Value: 560 Ohm
Lead Channel Pacing Threshold Amplitude: 0.25 V
Lead Channel Pacing Threshold Amplitude: 0.25 V
Lead Channel Pacing Threshold Pulse Width: 0.5 ms
Lead Channel Pacing Threshold Pulse Width: 0.5 ms
Lead Channel Sensing Intrinsic Amplitude: 12 mV
Lead Channel Sensing Intrinsic Amplitude: 4.9 mV
Lead Channel Setting Pacing Amplitude: 2 V
Lead Channel Setting Pacing Amplitude: 2.5 V
Lead Channel Setting Pacing Pulse Width: 0.5 ms
Lead Channel Setting Sensing Sensitivity: 4 mV
Pulse Gen Model: 2272
Pulse Gen Serial Number: 3975389

## 2024-10-24 NOTE — Progress Notes (Signed)
 Remote PPM Transmission

## 2024-10-29 ENCOUNTER — Ambulatory Visit: Payer: Self-pay | Admitting: Cardiology

## 2024-12-02 ENCOUNTER — Ambulatory Visit: Payer: Medicare Other

## 2024-12-04 LAB — CUP PACEART REMOTE DEVICE CHECK
Battery Remaining Longevity: 76 mo
Battery Remaining Percentage: 71 %
Battery Voltage: 3.01 V
Brady Statistic AP VP Percent: 7.3 %
Brady Statistic AP VS Percent: 1 %
Brady Statistic AS VP Percent: 92 %
Brady Statistic AS VS Percent: 1 %
Brady Statistic RA Percent Paced: 6.9 %
Brady Statistic RV Percent Paced: 99 %
Date Time Interrogation Session: 20251215020013
Implantable Lead Connection Status: 753985
Implantable Lead Connection Status: 753985
Implantable Lead Implant Date: 20221121
Implantable Lead Implant Date: 20221121
Implantable Lead Location: 753859
Implantable Lead Location: 753860
Implantable Pulse Generator Implant Date: 20221121
Lead Channel Impedance Value: 490 Ohm
Lead Channel Impedance Value: 550 Ohm
Lead Channel Pacing Threshold Amplitude: 0.25 V
Lead Channel Pacing Threshold Amplitude: 0.25 V
Lead Channel Pacing Threshold Pulse Width: 0.5 ms
Lead Channel Pacing Threshold Pulse Width: 0.5 ms
Lead Channel Sensing Intrinsic Amplitude: 12 mV
Lead Channel Sensing Intrinsic Amplitude: 5 mV
Lead Channel Setting Pacing Amplitude: 2 V
Lead Channel Setting Pacing Amplitude: 2.5 V
Lead Channel Setting Pacing Pulse Width: 0.5 ms
Lead Channel Setting Sensing Sensitivity: 4 mV
Pulse Gen Model: 2272
Pulse Gen Serial Number: 3975389

## 2024-12-04 NOTE — Progress Notes (Signed)
 Remote PPM Transmission

## 2024-12-21 ENCOUNTER — Ambulatory Visit: Payer: Self-pay | Admitting: Cardiology
